# Patient Record
Sex: Female | Born: 1945 | Race: White | Hispanic: No | Smoking: Never smoker
Health system: Southern US, Community
[De-identification: ages and names within clinical notes are randomized; demographics above are authoritative.]

## PROBLEM LIST (undated history)

## (undated) DIAGNOSIS — F32A Depression, unspecified: Secondary | ICD-10-CM

## (undated) DIAGNOSIS — J189 Pneumonia, unspecified organism: Secondary | ICD-10-CM

## (undated) DIAGNOSIS — R112 Nausea with vomiting, unspecified: Secondary | ICD-10-CM

## (undated) DIAGNOSIS — D649 Anemia, unspecified: Secondary | ICD-10-CM

## (undated) DIAGNOSIS — I5032 Chronic diastolic (congestive) heart failure: Secondary | ICD-10-CM

## (undated) DIAGNOSIS — R519 Headache, unspecified: Secondary | ICD-10-CM

## (undated) DIAGNOSIS — I10 Essential (primary) hypertension: Secondary | ICD-10-CM

## (undated) DIAGNOSIS — E039 Hypothyroidism, unspecified: Secondary | ICD-10-CM

## (undated) DIAGNOSIS — R51 Headache: Secondary | ICD-10-CM

## (undated) DIAGNOSIS — R55 Syncope and collapse: Secondary | ICD-10-CM

## (undated) DIAGNOSIS — E1110 Type 2 diabetes mellitus with ketoacidosis without coma: Secondary | ICD-10-CM

## (undated) DIAGNOSIS — M1990 Unspecified osteoarthritis, unspecified site: Secondary | ICD-10-CM

## (undated) DIAGNOSIS — J449 Chronic obstructive pulmonary disease, unspecified: Secondary | ICD-10-CM

## (undated) DIAGNOSIS — R42 Dizziness and giddiness: Secondary | ICD-10-CM

## (undated) DIAGNOSIS — B029 Zoster without complications: Secondary | ICD-10-CM

## (undated) DIAGNOSIS — E785 Hyperlipidemia, unspecified: Secondary | ICD-10-CM

## (undated) DIAGNOSIS — D126 Benign neoplasm of colon, unspecified: Secondary | ICD-10-CM

## (undated) DIAGNOSIS — Z9889 Other specified postprocedural states: Secondary | ICD-10-CM

## (undated) DIAGNOSIS — I509 Heart failure, unspecified: Secondary | ICD-10-CM

## (undated) DIAGNOSIS — E079 Disorder of thyroid, unspecified: Secondary | ICD-10-CM

## (undated) DIAGNOSIS — R569 Unspecified convulsions: Secondary | ICD-10-CM

## (undated) DIAGNOSIS — K219 Gastro-esophageal reflux disease without esophagitis: Secondary | ICD-10-CM

## (undated) DIAGNOSIS — F329 Major depressive disorder, single episode, unspecified: Secondary | ICD-10-CM

## (undated) DIAGNOSIS — J40 Bronchitis, not specified as acute or chronic: Secondary | ICD-10-CM

## (undated) HISTORY — DX: Unspecified convulsions: R56.9

## (undated) HISTORY — PX: TONSILLECTOMY: SUR1361

## (undated) HISTORY — PX: KNEE ARTHROSCOPY: SHX127

## (undated) HISTORY — DX: Gastro-esophageal reflux disease without esophagitis: K21.9

## (undated) HISTORY — DX: Unspecified osteoarthritis, unspecified site: M19.90

## (undated) HISTORY — DX: Major depressive disorder, single episode, unspecified: F32.9

## (undated) HISTORY — DX: Chronic obstructive pulmonary disease, unspecified: J44.9

## (undated) HISTORY — DX: Depression, unspecified: F32.A

## (undated) HISTORY — PX: COLONOSCOPY W/ BIOPSIES AND POLYPECTOMY: SHX1376

## (undated) HISTORY — DX: Bronchitis, not specified as acute or chronic: J40

## (undated) HISTORY — PX: CHOLECYSTECTOMY: SHX55

## (undated) HISTORY — DX: Benign neoplasm of colon, unspecified: D12.6

## (undated) HISTORY — PX: ESOPHAGOGASTRODUODENOSCOPY: SHX1529

## (undated) HISTORY — PX: CATARACT EXTRACTION W/ INTRAOCULAR LENS IMPLANT: SHX1309

## (undated) HISTORY — PX: ANKLE SURGERY: SHX546

## (undated) HISTORY — PX: OTHER SURGICAL HISTORY: SHX169

## (undated) HISTORY — PX: CATARACT EXTRACTION: SUR2

## (undated) HISTORY — PX: APPENDECTOMY: SHX54

## (undated) HISTORY — PX: NASAL SINUS SURGERY: SHX719

## (undated) HISTORY — DX: Zoster without complications: B02.9

## (undated) HISTORY — DX: Hyperlipidemia, unspecified: E78.5

## (undated) HISTORY — DX: Disorder of thyroid, unspecified: E07.9

## (undated) HISTORY — DX: Essential (primary) hypertension: I10

---

## 2008-03-05 ENCOUNTER — Ambulatory Visit (HOSPITAL_COMMUNITY): Admission: RE | Admit: 2008-03-05 | Discharge: 2008-03-05 | Payer: Self-pay | Admitting: Internal Medicine

## 2008-12-18 ENCOUNTER — Ambulatory Visit: Payer: Self-pay | Admitting: Internal Medicine

## 2008-12-18 DIAGNOSIS — Z860100 Personal history of colon polyps, unspecified: Secondary | ICD-10-CM | POA: Insufficient documentation

## 2008-12-18 DIAGNOSIS — K219 Gastro-esophageal reflux disease without esophagitis: Secondary | ICD-10-CM | POA: Insufficient documentation

## 2008-12-18 DIAGNOSIS — K5909 Other constipation: Secondary | ICD-10-CM | POA: Insufficient documentation

## 2008-12-18 DIAGNOSIS — E1169 Type 2 diabetes mellitus with other specified complication: Secondary | ICD-10-CM | POA: Insufficient documentation

## 2008-12-18 DIAGNOSIS — R1319 Other dysphagia: Secondary | ICD-10-CM | POA: Insufficient documentation

## 2009-01-07 ENCOUNTER — Ambulatory Visit: Payer: Self-pay | Admitting: Internal Medicine

## 2009-01-07 ENCOUNTER — Encounter: Payer: Self-pay | Admitting: Internal Medicine

## 2009-01-09 ENCOUNTER — Emergency Department (HOSPITAL_COMMUNITY): Admission: EM | Admit: 2009-01-09 | Discharge: 2009-01-09 | Payer: Self-pay | Admitting: Emergency Medicine

## 2009-01-10 ENCOUNTER — Encounter: Payer: Self-pay | Admitting: Internal Medicine

## 2009-02-18 ENCOUNTER — Ambulatory Visit (HOSPITAL_COMMUNITY): Admission: RE | Admit: 2009-02-18 | Discharge: 2009-02-18 | Payer: Self-pay | Admitting: Internal Medicine

## 2010-09-18 LAB — GLUCOSE, CAPILLARY: Glucose-Capillary: 342 mg/dL — ABNORMAL HIGH (ref 70–99)

## 2010-10-17 ENCOUNTER — Other Ambulatory Visit: Payer: Self-pay | Admitting: General Surgery

## 2010-10-17 DIAGNOSIS — K439 Ventral hernia without obstruction or gangrene: Secondary | ICD-10-CM

## 2010-10-20 ENCOUNTER — Other Ambulatory Visit: Payer: Self-pay

## 2010-10-25 ENCOUNTER — Ambulatory Visit
Admission: RE | Admit: 2010-10-25 | Discharge: 2010-10-25 | Disposition: A | Discharge status: Home / Self Care | Payer: Medicare Other | Source: Ambulatory Visit | Attending: General Surgery | Admitting: General Surgery

## 2010-10-25 DIAGNOSIS — K439 Ventral hernia without obstruction or gangrene: Secondary | ICD-10-CM

## 2010-10-25 MED ORDER — IOHEXOL 300 MG/ML  SOLN
125.0000 mL | Freq: Once | INTRAMUSCULAR | Status: AC | PRN
  Administered 2010-10-25: 125 mL via INTRAVENOUS

## 2010-11-16 ENCOUNTER — Encounter (INDEPENDENT_AMBULATORY_CARE_PROVIDER_SITE_OTHER): Payer: Self-pay | Admitting: General Surgery

## 2011-02-07 ENCOUNTER — Emergency Department (HOSPITAL_COMMUNITY)
Admission: EM | Admit: 2011-02-07 | Discharge: 2011-02-07 | Disposition: A | Discharge status: Home / Self Care | Payer: Medicare Other | Attending: Emergency Medicine | Admitting: Emergency Medicine

## 2011-02-07 ENCOUNTER — Encounter (HOSPITAL_COMMUNITY): Payer: Self-pay

## 2011-02-07 DIAGNOSIS — E119 Type 2 diabetes mellitus without complications: Secondary | ICD-10-CM | POA: Insufficient documentation

## 2011-02-07 DIAGNOSIS — I10 Essential (primary) hypertension: Secondary | ICD-10-CM | POA: Insufficient documentation

## 2011-02-07 DIAGNOSIS — E079 Disorder of thyroid, unspecified: Secondary | ICD-10-CM | POA: Insufficient documentation

## 2011-02-07 DIAGNOSIS — H109 Unspecified conjunctivitis: Secondary | ICD-10-CM

## 2011-02-07 DIAGNOSIS — H10029 Other mucopurulent conjunctivitis, unspecified eye: Secondary | ICD-10-CM | POA: Insufficient documentation

## 2011-02-07 MED ORDER — TETRACAINE HCL 0.5 % OP SOLN
OPHTHALMIC | Status: AC
  Filled 2011-02-07: qty 2

## 2011-02-07 NOTE — ED Provider Notes (Signed)
History     CSN: [id number] Arrival date & time: 02/07/2011  4:42 PM  Chief Complaint  Patient presents with  . Foreign Body in Eye   Patient is a 65 y.o. female presenting with foreign body in eye. The history is provided by the patient. No language interpreter was used.  Foreign Body in Eye This is a new problem. The current episode started today. The symptoms are aggravated by nothing. She has tried nothing for the symptoms. Improvement on treatment: improving spontaneously.    Past Medical History  Diagnosis Date  . Diabetes mellitus   . Hypertension   . Arthritis   . Cramps, muscle, general   . Shingles   . Thyroid disease   . Bronchitis     Past Surgical History  Procedure Date  . Cholecystectomy   . Orthopedic surgery   . Non cancerous mass removed     small intestine  . Tonsillectomy   . Eye surgery     cataract syrgery with implants both eyes  . Nasal sinus surgery     Family History  Problem Relation Age of Onset  . Diabetes Mother   . Asthma Mother   . Hypertension Father   . Heart disease Father   . Diabetes Father   . Heart disease Sister   . Hypertension Sister   . Asthma Sister   . Diabetes Brother   . Heart disease Brother   . Stroke Brother     History  Substance Use Topics  . Smoking status: Unknown If Ever Smoked  . Smokeless tobacco: Not on file  . Alcohol Use: No    OB History    Grav Para Term Preterm Abortions TAB SAB Ect Mult Living                  Review of Systems  Eyes: Positive for pain. Negative for photophobia, discharge, redness, itching and visual disturbance.    Physical Exam  BP 152/58  Pulse 100  Temp(Src) 97.6 F (36.4 C) (Oral)  Resp 20  Ht 5\' 8"  (1.727 m)  Wt 253 lb (114.76 kg)  BMI 38.47 kg/m2  SpO2 99%  Physical Exam  Nursing note and vitals reviewed. Constitutional: She is oriented to person, place, and time. Vital signs are normal. She appears well-developed and well-nourished. No distress.    HENT:  Head: Normocephalic and atraumatic.  Right Ear: External ear normal.  Left Ear: External ear normal.  Nose: Nose normal.  Mouth/Throat: No oropharyngeal exudate.  Eyes: Conjunctivae, EOM and lids are normal. Pupils are equal, round, and reactive to light. Left eye exhibits no discharge, no exudate and no hordeolum. No foreign body present in the left eye. Left conjunctiva is not injected. No scleral icterus. Left eye exhibits normal extraocular motion and no nystagmus. Left pupil is round and reactive. Pupils are equal.    Neck: Normal range of motion. Neck supple. No JVD present. No tracheal deviation present. No thyromegaly present.  Cardiovascular: Normal rate, regular rhythm, normal heart sounds, intact distal pulses and normal pulses.  Exam reveals no gallop and no friction rub.   No murmur heard. Pulmonary/Chest: Effort normal and breath sounds normal. No stridor. No respiratory distress. She has no wheezes. She has no rales. She exhibits no tenderness.  Abdominal: Soft. Normal appearance and bowel sounds are normal. She exhibits no distension and no mass. There is no tenderness. There is no rebound and no guarding.  Musculoskeletal: Normal range of motion. She exhibits no  edema and no tenderness.  Lymphadenopathy:    She has no cervical adenopathy.  Neurological: She is alert and oriented to person, place, and time. She has normal reflexes. No cranial nerve deficit. Coordination normal. GCS eye subscore is 4. GCS verbal subscore is 5. GCS motor subscore is 6.  Reflex Scores:      Tricep reflexes are 2+ on the right side and 2+ on the left side.      Bicep reflexes are 2+ on the right side and 2+ on the left side.      Brachioradialis reflexes are 2+ on the right side and 2+ on the left side.      Patellar reflexes are 2+ on the right side and 2+ on the left side.      Achilles reflexes are 2+ on the right side and 2+ on the left side. Skin: Skin is warm and dry. No rash noted.  She is not diaphoretic.  Psychiatric: She has a normal mood and affect. Her speech is normal and behavior is normal. Judgment and thought content normal. Cognition and memory are normal.    ED Course  Procedures  MDM Pt's L   Eye irrigated with 250 ml of NS via morgan lens.  States she feels better.  PH now very close to 7.      Duaine Dredge, [place] 02/07/11 2028

## 2011-02-07 NOTE — ED Provider Notes (Signed)
Medical screening examination/treatment/procedure(s) were performed by non-physician practitioner and as supervising physician I was immediately available for consultation/collaboration.   Alfonzo Feller, DO 02/07/11 2211

## 2011-02-07 NOTE — ED Notes (Signed)
Morgan lens removed from left eye after 50 mls NS flushed through it.  Pt tolerated well. States eye feels better.

## 2011-02-07 NOTE — ED Notes (Signed)
Pt came to desk says "I want to go"  When told that her nurse would be in to speak with her, she became loud and  Michela Pitcher that she was spoken to loudly by nurse.  When I went in to speak with her, she said she was diabetic and had not eaten.  I offered to get her something to eat. She refused and called the nurse "the mouth of the [place]".  And spoke disparingly about this hospital.  "I want the doctor to see me so I can go home".  Pt  Speaking loudly and abusively to staff. Security came to talk to pt

## 2011-02-07 NOTE — ED Notes (Signed)
Pt states she accidentally put ear drops in her left eye (  Ring relief ) pt states she has flushed her eye with tap water

## 2011-02-07 NOTE — ED Notes (Signed)
Morgan Lens placed left eye-irrigation with NS 284ml.

## 2011-02-07 NOTE — ED Notes (Signed)
Pt at the desk stating she is diabetic and she has been here to long and that she needs to be seen. Pt very condescending when talking to the nurse at the desk stating "AMA"; I informed pt that I would find her nurse and she stated "you don't have to get an attitude about it, I will have your job" and following me around desk yelling out obscene comments; Jodi [place] RN assisted pt back to her room and security called; pt told that if she does not comply with the hospital rules she will be escorted out by security; security on standby and pt in room at this time

## 2011-02-07 NOTE — ED Notes (Signed)
Pt calm sitting on stretcher eating a biscuit  from her purse.  Sister at her side stated she is just an impatient person. Pt states would like to go home and has been tearful stating she can't believe she did this, R Miller PA notified of pt's impatience.

## 2011-04-13 ENCOUNTER — Ambulatory Visit (HOSPITAL_COMMUNITY)
Admission: RE | Admit: 2011-04-13 | Discharge: 2011-04-13 | Disposition: A | Discharge status: Home / Self Care | Payer: Medicare Other | Source: Ambulatory Visit | Attending: Internal Medicine | Admitting: Internal Medicine

## 2011-04-13 ENCOUNTER — Other Ambulatory Visit (HOSPITAL_COMMUNITY): Payer: Self-pay | Admitting: Internal Medicine

## 2011-04-13 DIAGNOSIS — R05 Cough: Secondary | ICD-10-CM | POA: Insufficient documentation

## 2011-04-13 DIAGNOSIS — R059 Cough, unspecified: Secondary | ICD-10-CM | POA: Insufficient documentation

## 2012-11-29 ENCOUNTER — Encounter: Payer: Self-pay | Admitting: Cardiovascular Disease

## 2013-04-30 ENCOUNTER — Ambulatory Visit (INDEPENDENT_AMBULATORY_CARE_PROVIDER_SITE_OTHER): Payer: Medicare Other | Admitting: Cardiovascular Disease

## 2013-04-30 ENCOUNTER — Encounter: Payer: Self-pay | Admitting: Cardiovascular Disease

## 2013-04-30 VITALS — BP 164/68 | HR 82 | Ht 68.0 in | Wt 227.0 lb

## 2013-04-30 DIAGNOSIS — R0600 Dyspnea, unspecified: Secondary | ICD-10-CM | POA: Insufficient documentation

## 2013-04-30 DIAGNOSIS — R0609 Other forms of dyspnea: Secondary | ICD-10-CM

## 2013-04-30 DIAGNOSIS — R079 Chest pain, unspecified: Secondary | ICD-10-CM

## 2013-04-30 DIAGNOSIS — R0989 Other specified symptoms and signs involving the circulatory and respiratory systems: Secondary | ICD-10-CM

## 2013-04-30 DIAGNOSIS — R011 Cardiac murmur, unspecified: Secondary | ICD-10-CM

## 2013-04-30 NOTE — Patient Instructions (Signed)
Your physician recommends that you schedule a follow-up appointment in:  4-5 weeks.   Your physician has requested that you have a lexiscan myoview. For further information please visit [email or web address]. Please follow instruction sheet, as given.  Your physician has requested that you have an echocardiogram. Echocardiography is a painless test that uses sound waves to create images of your heart. It provides your doctor with information about the size and shape of your heart and how well your heart's chambers and valves are working. This procedure takes approximately one hour. There are no restrictions for this procedure.

## 2013-04-30 NOTE — Progress Notes (Signed)
History of Present Illness: 67 yo female with history of DM, HTN, HLD, hypothyroidism, GERD, anemia here today as a new patient for evaluation of dyspnea. She has no prior documented cardiac disease. She tells me that she has been having dyspnea with minimal exertion. She has recently gained 29 lbs over last 6 months. She also describes chest pressure, center of chest when walking. Resolves quickly with rest. She also has a dry hacking cough. She has a rescue inhaler for frequent episodes of bronchitis. She had a stress test 15 years ago. She had a cardiac cath that year and was told it was normal. Chronic right lower ext edema from orthopedic injury.   Primary Care Physician: Reynold Bowen   Past Medical History  Diagnosis Date  . Diabetes mellitus   . Hypertension   . Arthritis   . Shingles   . hypothyroidism   . Bronchitis   . Hyperlipidemia   . GERD (gastroesophageal reflux disease)     Past Surgical History  Procedure Laterality Date  . Cholecystectomy    . Ankle surgery      x 4  . Non cancerous mass removed      small intestine  . Tonsillectomy    . Nasal sinus surgery    . Cataract extraction Bilateral   . Appendectomy    . Knee arthroscopy      Current Outpatient Prescriptions  Medication Sig Dispense Refill  . acetaminophen-codeine (TYLENOL #3) 300-30 MG per tablet Take 1 tablet by mouth every 4 (four) hours as needed. For pain      . Carbamide Peroxide (EAR DROPS OT) Place 1 drop in ear(s) as needed.        . carisoprodol (SOMA) 350 MG tablet Take 700 mg by mouth 2 (two) times daily. For cramps      . esomeprazole (NEXIUM) 40 MG capsule Take 40 mg by mouth daily before breakfast.        . furosemide (LASIX) 20 MG tablet Take 20 mg by mouth daily as needed.       . Hypromellose (GENTEAL OP) Apply 1 drop to eye daily. For moisture       . insulin regular (HUMULIN R) 100 units/mL injection Inject 10 Units into the skin 3 (three) times daily before meals.          . insulin regular (HUMULIN R,NOVOLIN R) 100 UNIT/ML injection Inject 500 Units into the skin 3 (three) times daily before meals.        Marland Kitchen levothyroxine (SYNTHROID, LEVOTHROID) 125 MCG tablet Take 125 mcg by mouth daily.        Marland Kitchen PARoxetine (PAXIL) 20 MG tablet Take 20 mg by mouth every morning.        . quinapril (ACCUPRIL) 20 MG tablet Take 20 mg by mouth daily.       . simvastatin (ZOCOR) 20 MG tablet Take 20 mg by mouth at bedtime.         No current facility-administered medications for this visit.    Allergies  Allergen Reactions  . Aspirin     REACTION: rash  . Erythromycin     REACTION: VOMITING  . Levofloxacin     REACTION: rash  . Sulfonamide Derivatives     REACTION: anaphylaxis    History   Social History  . Marital Status: Widowed    Spouse Name: N/A    Number of Children: N/A  . Years of Education: N/A   Occupational History  .  Retired-[employer]    Social History Main Topics  . Smoking status: Never Smoker   . Smokeless tobacco: Not on file  . Alcohol Use: No  . Drug Use: No  . Sexual Activity: Not on file   Other Topics Concern  . Not on file   Social History Narrative  . No narrative on file    Family History  Problem Relation Age of Onset  . Diabetes Mother   . Asthma Mother   . Hypertension Father   . Heart disease Father   . Diabetes Father   . Heart disease Sister   . Hypertension Sister   . Asthma Sister   . Diabetes Brother   . CAD Brother   . Stroke Brother     Review of Systems:  As stated in the HPI and otherwise negative.   BP 164/68  Pulse 82  Ht 5\' 8"  (1.727 m)  Wt 227 lb (102.967 kg)  BMI 34.52 kg/m2  Physical Examination: General: Well developed, well nourished, NAD HEENT: OP clear, mucus membranes moist SKIN: warm, dry. No rashes. Neuro: No focal deficits Musculoskeletal: Muscle strength 5/5 all ext Psychiatric: Mood and affect normal Neck: No JVD, no carotid bruits, no thyromegaly, no lymphadenopathy. Lungs:Clear  bilaterally, no wheezes, rhonci, crackles Cardiovascular: Regular rate and rhythm. No murmurs, gallops or rubs. Abdomen:Soft. Bowel sounds present. Non-tender.  Extremities: No lower extremity edema. Pulses are 2 + in the bilateral DP/PT.  EKG: NSR, rate 82 bpm. Non-specific ST abnormality.   Assessment and Plan:   1. Dyspnea: She has risk factors for CAD including HTN, HLD, DM, obesity, FH of CAD. Will arrange stress myoview to exclude ischemia. Will also arrange echo to exclude structural heart disease.   2. Chest pain: As above, will arrange stress myoview to exclude ischemia.   3. Cardiac murmur: Will assess with echo.

## 2013-05-15 ENCOUNTER — Ambulatory Visit (HOSPITAL_COMMUNITY): Payer: Medicare Other | Attending: Cardiology | Admitting: Radiology

## 2013-05-15 ENCOUNTER — Encounter: Payer: Self-pay | Admitting: Cardiology

## 2013-05-15 DIAGNOSIS — I10 Essential (primary) hypertension: Secondary | ICD-10-CM | POA: Insufficient documentation

## 2013-05-15 DIAGNOSIS — R0989 Other specified symptoms and signs involving the circulatory and respiratory systems: Secondary | ICD-10-CM | POA: Insufficient documentation

## 2013-05-15 DIAGNOSIS — I059 Rheumatic mitral valve disease, unspecified: Secondary | ICD-10-CM | POA: Insufficient documentation

## 2013-05-15 DIAGNOSIS — E119 Type 2 diabetes mellitus without complications: Secondary | ICD-10-CM | POA: Insufficient documentation

## 2013-05-15 DIAGNOSIS — Z6834 Body mass index (BMI) 34.0-34.9, adult: Secondary | ICD-10-CM | POA: Insufficient documentation

## 2013-05-15 DIAGNOSIS — R0600 Dyspnea, unspecified: Secondary | ICD-10-CM

## 2013-05-15 DIAGNOSIS — R011 Cardiac murmur, unspecified: Secondary | ICD-10-CM | POA: Insufficient documentation

## 2013-05-15 DIAGNOSIS — E669 Obesity, unspecified: Secondary | ICD-10-CM | POA: Insufficient documentation

## 2013-05-15 DIAGNOSIS — R072 Precordial pain: Secondary | ICD-10-CM

## 2013-05-15 DIAGNOSIS — R079 Chest pain, unspecified: Secondary | ICD-10-CM | POA: Insufficient documentation

## 2013-05-15 DIAGNOSIS — R0602 Shortness of breath: Secondary | ICD-10-CM | POA: Insufficient documentation

## 2013-05-15 DIAGNOSIS — R0609 Other forms of dyspnea: Secondary | ICD-10-CM | POA: Insufficient documentation

## 2013-05-15 DIAGNOSIS — I359 Nonrheumatic aortic valve disorder, unspecified: Secondary | ICD-10-CM | POA: Insufficient documentation

## 2013-05-15 DIAGNOSIS — E785 Hyperlipidemia, unspecified: Secondary | ICD-10-CM | POA: Insufficient documentation

## 2013-05-15 NOTE — Progress Notes (Signed)
Echocardiogram performed.  

## 2013-05-22 ENCOUNTER — Encounter: Payer: Self-pay | Admitting: Cardiovascular Disease

## 2013-05-22 ENCOUNTER — Ambulatory Visit (HOSPITAL_COMMUNITY): Payer: Medicare Other | Attending: Cardiovascular Disease | Admitting: Radiology

## 2013-05-22 VITALS — BP 166/73 | Ht 68.0 in | Wt 228.0 lb

## 2013-05-22 DIAGNOSIS — R0609 Other forms of dyspnea: Secondary | ICD-10-CM | POA: Insufficient documentation

## 2013-05-22 DIAGNOSIS — Z794 Long term (current) use of insulin: Secondary | ICD-10-CM | POA: Insufficient documentation

## 2013-05-22 DIAGNOSIS — I10 Essential (primary) hypertension: Secondary | ICD-10-CM | POA: Insufficient documentation

## 2013-05-22 DIAGNOSIS — Z8249 Family history of ischemic heart disease and other diseases of the circulatory system: Secondary | ICD-10-CM | POA: Insufficient documentation

## 2013-05-22 DIAGNOSIS — E785 Hyperlipidemia, unspecified: Secondary | ICD-10-CM | POA: Insufficient documentation

## 2013-05-22 DIAGNOSIS — R079 Chest pain, unspecified: Secondary | ICD-10-CM | POA: Insufficient documentation

## 2013-05-22 DIAGNOSIS — E109 Type 1 diabetes mellitus without complications: Secondary | ICD-10-CM | POA: Insufficient documentation

## 2013-05-22 DIAGNOSIS — R0600 Dyspnea, unspecified: Secondary | ICD-10-CM

## 2013-05-22 DIAGNOSIS — R0989 Other specified symptoms and signs involving the circulatory and respiratory systems: Secondary | ICD-10-CM | POA: Insufficient documentation

## 2013-05-22 MED ORDER — REGADENOSON 0.4 MG/5ML IV SOLN
0.4000 mg | Freq: Once | INTRAVENOUS | Status: AC
  Administered 2013-05-22: 0.4 mg via INTRAVENOUS

## 2013-05-22 MED ORDER — TECHNETIUM TC 99M SESTAMIBI GENERIC - CARDIOLITE
33.0000 | Freq: Once | INTRAVENOUS | Status: AC | PRN
  Administered 2013-05-22: 33 via INTRAVENOUS

## 2013-05-22 MED ORDER — TECHNETIUM TC 99M SESTAMIBI GENERIC - CARDIOLITE
11.0000 | Freq: Once | INTRAVENOUS | Status: AC | PRN
  Administered 2013-05-22: 11 via INTRAVENOUS

## 2013-05-22 NOTE — Progress Notes (Signed)
[facility] [address] [place], [place] [address] [phone]    Cardiology Nuclear Med Study  Jaclyn Herrera is a 67 y.o. female     MRN : [id number]     DOB: 1946/01/17  Procedure Date: 05/22/2013  Nuclear Med Background Indication for Stress Test:  Evaluation for Ischemia History:  Cath: 15 yrs NL, 05/15/13 ECHO:  Cardiac Risk Factors: Family History - CAD, Hypertension, IDDM Type 1 and Lipids  Symptoms:  Chest Pain and DOE   Nuclear Pre-Procedure Caffeine/Decaff Intake:  None NPO After: 10:00pm   Lungs:  clear O2 Sat: 98% on room air. IV 0.9% NS with Angio Cath:  22g  IV Site: L Antecubital  IV Started by:  Perrin Maltese, EMT-P  Chest Size (in):  48 Cup Size: A  Height: 5\' 8"  (1.727 m)  Weight:  228 lb (103.42 kg)  BMI:  Body mass index is 34.68 kg/(m^2). Tech Comments:  No Rx this am CBG 375 mg/dl 2 9:15 this am    Nuclear Med Study 1 or 2 day study: 1 day  Stress Test Type:  Carlton Adam  Reading MD: Jaclyn Rouge, MD  Order Authorizing Provider:  C.McAlhany MD  Resting Radionuclide: Technetium 23m Sestamibi  Resting Radionuclide Dose: 10.8 mCi   Stress Radionuclide:  Technetium 24m Sestamibi  Stress Radionuclide Dose: 33.0 mCi           Stress Protocol Rest HR: 74 Stress HR: 88  Rest BP: 166/73 Stress BP: 178/60  Exercise Time (min): n/a METS: n/a   Predicted Max HR: 152 bpm % Max HR: 57.89 bpm Rate Pressure Product: 15664   Dose of Adenosine (mg):  n/a Dose of Lexiscan: 0.4 mg  Dose of Atropine (mg): n/a Dose of Dobutamine: n/a mcg/kg/min (at max HR)  Stress Test Technologist: Perrin Maltese, EMT-P  Nuclear Technologist:  Charlton Amor, CNMT     Rest Procedure:  Myocardial perfusion imaging was performed at rest 45 minutes following the intravenous administration of Technetium 79m Sestamibi. Rest ECG: NSR - Normal EKG  Stress Procedure:  The patient received IV Lexiscan 0.4 mg over 15-seconds.  Technetium 28m Sestamibi  injected at 30-seconds. This patient had sob with the Lexiscan injection. Quantitative spect images were obtained after a 45 minute delay. Stress ECG: No significant change from baseline ECG  QPS Raw Data Images:  Normal; no motion artifact; normal heart/lung ratio. Stress Images:  Normal homogeneous uptake in all areas of the myocardium. Rest Images:  Normal homogeneous uptake in all areas of the myocardium. Subtraction (SDS):  Normal Transient Ischemic Dilatation (Normal <1.22):  1.06 Lung/Heart Ratio (Normal <0.45):  0.40  Quantitative Gated Spect Images QGS EDV:  92 ml QGS ESV:  28 ml  Impression Exercise Capacity:  Lexiscan with no exercise. BP Response:  Normal blood pressure response. Clinical Symptoms:  There is dyspnea. ECG Impression:  No significant ST segment change suggestive of ischemia. Comparison with Prior Nuclear Study: No images to compare  Overall Impression:  Normal stress nuclear study.  LV Ejection Fraction: 70%.  LV Wall Motion:  NL LV Function; NL Wall Motion   Jaclyn Herrera

## 2013-05-30 ENCOUNTER — Encounter: Payer: Self-pay | Admitting: Cardiovascular Disease

## 2013-05-30 ENCOUNTER — Ambulatory Visit (INDEPENDENT_AMBULATORY_CARE_PROVIDER_SITE_OTHER): Payer: Medicare Other | Admitting: Cardiovascular Disease

## 2013-05-30 VITALS — BP 173/83 | HR 79 | Ht 68.0 in | Wt 226.0 lb

## 2013-05-30 DIAGNOSIS — I5032 Chronic diastolic (congestive) heart failure: Secondary | ICD-10-CM

## 2013-05-30 DIAGNOSIS — I10 Essential (primary) hypertension: Secondary | ICD-10-CM

## 2013-05-30 DIAGNOSIS — I340 Nonrheumatic mitral (valve) insufficiency: Secondary | ICD-10-CM

## 2013-05-30 DIAGNOSIS — R0600 Dyspnea, unspecified: Secondary | ICD-10-CM

## 2013-05-30 DIAGNOSIS — I509 Heart failure, unspecified: Secondary | ICD-10-CM

## 2013-05-30 DIAGNOSIS — I059 Rheumatic mitral valve disease, unspecified: Secondary | ICD-10-CM

## 2013-05-30 DIAGNOSIS — R079 Chest pain, unspecified: Secondary | ICD-10-CM

## 2013-05-30 DIAGNOSIS — I517 Cardiomegaly: Secondary | ICD-10-CM

## 2013-05-30 DIAGNOSIS — R0609 Other forms of dyspnea: Secondary | ICD-10-CM

## 2013-05-30 MED ORDER — QUINAPRIL HCL 40 MG PO TABS: 40.0000 mg | ORAL_TABLET | Freq: Every day | ORAL | Status: DC

## 2013-05-30 NOTE — Patient Instructions (Signed)
Your physician wants you to follow-up in: 4 months.  You will receive a reminder letter in the mail two months in advance. If you don't receive a letter, please call our office to schedule the follow-up appointment.  Your physician has recommended you make the following change in your medication: Increase quinapril to 40 mg by mouth daily

## 2013-05-30 NOTE — Progress Notes (Signed)
History of Present Illness: 67 yo female with history of DM, HTN, HLD, hypothyroidism, GERD, anemia here today for cardiac follow up. I saw her as a new patient 04/30/13  for evaluation of dyspnea. She has no prior documented cardiac disease. She told me that she has been having dyspnea with minimal exertion. She has recently gained 29 lbs over last 6 months. She also describes chest pressure, center of chest when walking. Resolves quickly with rest. She also has a dry hacking cough. She has a rescue inhaler for frequent episodes of bronchitis. She had a stress test 15 years ago. She had a cardiac cath that year and was told it was normal. Chronic right lower ext edema from orthopedic injury. Echo 05/15/13 with moderate LVH, normal LV function, grade 2 diastolic dysfunction, mild MR. Lexiscan stress myoview 05/22/13 with no ischemia.   She is here today for follow up. She is feeling well. No chest pain. Breathing is stable. Weight is stable.   Primary Care Physician: Reynold Bowen   Past Medical History  Diagnosis Date  . Diabetes mellitus   . Hypertension   . Arthritis   . Shingles   . hypothyroidism   . Bronchitis   . Hyperlipidemia   . GERD (gastroesophageal reflux disease)     Past Surgical History  Procedure Laterality Date  . Cholecystectomy    . Ankle surgery      x 4  . Non cancerous mass removed      small intestine  . Tonsillectomy    . Nasal sinus surgery    . Cataract extraction Bilateral   . Appendectomy    . Knee arthroscopy      Current Outpatient Prescriptions  Medication Sig Dispense Refill  . acetaminophen-codeine (TYLENOL #3) 300-30 MG per tablet Take 1 tablet by mouth every 4 (four) hours as needed. For pain      . carisoprodol (SOMA) 350 MG tablet Take 700 mg by mouth 2 (two) times daily. For cramps      . esomeprazole (NEXIUM) 40 MG capsule Take 40 mg by mouth daily before breakfast.        . furosemide (LASIX) 20 MG tablet Take 20 mg by mouth daily  as needed.       . Hypromellose (GENTEAL OP) Apply 1 drop to eye daily. For moisture       . insulin regular (HUMULIN R,NOVOLIN R) 100 UNIT/ML injection Inject 500 Units into the skin 3 (three) times daily before meals.        Marland Kitchen levothyroxine (SYNTHROID, LEVOTHROID) 125 MCG tablet Take 125 mcg by mouth daily.        Marland Kitchen PARoxetine (PAXIL) 20 MG tablet Take 20 mg by mouth every morning.        . quinapril (ACCUPRIL) 20 MG tablet Take 20 mg by mouth daily.       . simvastatin (ZOCOR) 20 MG tablet Take 20 mg by mouth at bedtime.         No current facility-administered medications for this visit.    Allergies  Allergen Reactions  . Aspirin     REACTION: rash  . Erythromycin     REACTION: VOMITING  . Levofloxacin     REACTION: rash  . Sulfonamide Derivatives     REACTION: anaphylaxis    History   Social History  . Marital Status: Widowed    Spouse Name: N/A    Number of Children: N/A  . Years of Education: N/A  Occupational History  . Retired-[employer]    Social History Main Topics  . Smoking status: Never Smoker   . Smokeless tobacco: Not on file  . Alcohol Use: No  . Drug Use: No  . Sexual Activity: Not on file   Other Topics Concern  . Not on file   Social History Narrative  . No narrative on file    Family History  Problem Relation Age of Onset  . Diabetes Mother   . Asthma Mother   . Hypertension Father   . Heart disease Father   . Diabetes Father   . Heart disease Sister   . Hypertension Sister   . Asthma Sister   . Diabetes Brother   . CAD Brother   . Stroke Brother     Review of Systems:  As stated in the HPI and otherwise negative.   BP 173/83  Pulse 79  Ht 5\' 8"  (1.727 m)  Wt 226 lb (102.513 kg)  BMI 34.37 kg/m2  Physical Examination: General: Well developed, well nourished, NAD HEENT: OP clear, mucus membranes moist SKIN: warm, dry. No rashes. Neuro: No focal deficits Musculoskeletal: Muscle strength 5/5 all ext Psychiatric: Mood and  affect normal Neck: No JVD, no carotid bruits, no thyromegaly, no lymphadenopathy. Lungs:Clear bilaterally, no wheezes, rhonci, crackles Cardiovascular: Regular rate and rhythm. No murmurs, gallops or rubs. Abdomen:Soft. Bowel sounds present. Non-tender.  Extremities: No lower extremity edema. Pulses are 2 + in the bilateral DP/PT.  EKG: NSR, rate 82 bpm. Non-specific ST abnormality.   Echo 05/15/13: Left ventricle: The cavity size was normal. There was moderate concentric hypertrophy. Systolic function was normal. The estimated ejection fraction was in the range of 60% to 65%. Wall motion was normal; there were no regional wall motion abnormalities. Features are consistent with a pseudonormal left ventricular filling pattern, with concomitant abnormal relaxation and increased filling pressure (grade 2 diastolic dysfunction). Doppler parameters are consistent with elevated ventricular end-diastolic filling pressure. - Aortic valve: Mildly calcified annulus. - Mitral valve: Calcified annulus. Mildly thickened leaflets . Mild regurgitation. - Left atrium: The atrium was mildly dilated. - Atrial septum: No defect or patent foramen ovale was Identified.  Lexiscan stress myoview 05/22/13: Stress Procedure: The patient received IV Lexiscan 0.4 mg over 15-seconds. Technetium 56m Sestamibi injected at 30-seconds. This patient had sob with the Lexiscan injection. Quantitative spect images were obtained after a 45 minute delay.  Stress ECG: No significant change from baseline ECG  QPS  Raw Data Images: Normal; no motion artifact; normal heart/lung ratio.  Stress Images: Normal homogeneous uptake in all areas of the myocardium.  Rest Images: Normal homogeneous uptake in all areas of the myocardium.  Subtraction (SDS): Normal  Transient Ischemic Dilatation (Normal <1.22): 1.06  Lung/Heart Ratio (Normal <0.45): 0.40  Quantitative Gated Spect Images  QGS EDV: 92 ml  QGS ESV: 28 ml    Impression  Exercise Capacity: Lexiscan with no exercise.  BP Response: Normal blood pressure response.  Clinical Symptoms: There is dyspnea.  ECG Impression: No significant ST segment change suggestive of ischemia.  Comparison with Prior Nuclear Study: No images to compare  Overall Impression: Normal stress nuclear study.  LV Ejection Fraction: 70%. LV Wall Motion: NL LV Function; NL Wall Motion   Assessment and Plan:   1. Dyspnea: No evidence of ischemia on stress test. She does have moderate LVH. BP control will be important. Will try to get better BP control by increasing Lisinopril. I think her dyspnea is related to her weight  and deconditioning. Will resume daily exercise.   2. Chest pain: Non-cardiac. No further ischemic evaluation.   3. Mitral regurgitation: Mild by echo 12/14. Repeat echo December 2016.   4. Hypertensive heart disease: moderate LVH on echo. Will attempt better BP control with increased dose of Ace-inh.   5. HTN: BP is elevated today. Will increase quinapril to 40 mg po Qdaily  6. Chronic diastolic CHF: Continue Lasix. Weight is stable.

## 2013-07-08 DIAGNOSIS — I10 Essential (primary) hypertension: Secondary | ICD-10-CM | POA: Diagnosis not present

## 2013-07-08 DIAGNOSIS — D599 Acquired hemolytic anemia, unspecified: Secondary | ICD-10-CM | POA: Diagnosis not present

## 2013-07-08 DIAGNOSIS — E1149 Type 2 diabetes mellitus with other diabetic neurological complication: Secondary | ICD-10-CM | POA: Diagnosis not present

## 2013-07-08 DIAGNOSIS — F329 Major depressive disorder, single episode, unspecified: Secondary | ICD-10-CM | POA: Diagnosis not present

## 2013-07-08 DIAGNOSIS — K222 Esophageal obstruction: Secondary | ICD-10-CM | POA: Diagnosis not present

## 2013-07-08 DIAGNOSIS — E042 Nontoxic multinodular goiter: Secondary | ICD-10-CM | POA: Diagnosis not present

## 2013-07-08 DIAGNOSIS — E1142 Type 2 diabetes mellitus with diabetic polyneuropathy: Secondary | ICD-10-CM | POA: Diagnosis not present

## 2013-07-08 DIAGNOSIS — E785 Hyperlipidemia, unspecified: Secondary | ICD-10-CM | POA: Diagnosis not present

## 2013-07-14 ENCOUNTER — Emergency Department (HOSPITAL_COMMUNITY)
Admission: EM | Admit: 2013-07-14 | Discharge: 2013-07-14 | Disposition: A | Discharge status: Home / Self Care | Payer: Medicare Other | Attending: Emergency Medicine | Admitting: Emergency Medicine

## 2013-07-14 ENCOUNTER — Emergency Department (HOSPITAL_COMMUNITY): Payer: Medicare Other

## 2013-07-14 ENCOUNTER — Encounter (HOSPITAL_COMMUNITY): Payer: Self-pay | Admitting: Emergency Medicine

## 2013-07-14 DIAGNOSIS — E119 Type 2 diabetes mellitus without complications: Secondary | ICD-10-CM | POA: Insufficient documentation

## 2013-07-14 DIAGNOSIS — Z79899 Other long term (current) drug therapy: Secondary | ICD-10-CM | POA: Insufficient documentation

## 2013-07-14 DIAGNOSIS — Y929 Unspecified place or not applicable: Secondary | ICD-10-CM | POA: Insufficient documentation

## 2013-07-14 DIAGNOSIS — Z8619 Personal history of other infectious and parasitic diseases: Secondary | ICD-10-CM | POA: Insufficient documentation

## 2013-07-14 DIAGNOSIS — S0990XA Unspecified injury of head, initial encounter: Secondary | ICD-10-CM | POA: Insufficient documentation

## 2013-07-14 DIAGNOSIS — W1809XA Striking against other object with subsequent fall, initial encounter: Secondary | ICD-10-CM | POA: Insufficient documentation

## 2013-07-14 DIAGNOSIS — K219 Gastro-esophageal reflux disease without esophagitis: Secondary | ICD-10-CM | POA: Insufficient documentation

## 2013-07-14 DIAGNOSIS — R739 Hyperglycemia, unspecified: Secondary | ICD-10-CM

## 2013-07-14 DIAGNOSIS — S0100XA Unspecified open wound of scalp, initial encounter: Secondary | ICD-10-CM | POA: Insufficient documentation

## 2013-07-14 DIAGNOSIS — E785 Hyperlipidemia, unspecified: Secondary | ICD-10-CM | POA: Insufficient documentation

## 2013-07-14 DIAGNOSIS — S0101XA Laceration without foreign body of scalp, initial encounter: Secondary | ICD-10-CM

## 2013-07-14 DIAGNOSIS — Z9889 Other specified postprocedural states: Secondary | ICD-10-CM | POA: Insufficient documentation

## 2013-07-14 DIAGNOSIS — I10 Essential (primary) hypertension: Secondary | ICD-10-CM | POA: Insufficient documentation

## 2013-07-14 DIAGNOSIS — W19XXXA Unspecified fall, initial encounter: Secondary | ICD-10-CM

## 2013-07-14 DIAGNOSIS — M129 Arthropathy, unspecified: Secondary | ICD-10-CM | POA: Insufficient documentation

## 2013-07-14 DIAGNOSIS — E079 Disorder of thyroid, unspecified: Secondary | ICD-10-CM | POA: Insufficient documentation

## 2013-07-14 DIAGNOSIS — Y939 Activity, unspecified: Secondary | ICD-10-CM | POA: Insufficient documentation

## 2013-07-14 DIAGNOSIS — Z794 Long term (current) use of insulin: Secondary | ICD-10-CM | POA: Insufficient documentation

## 2013-07-14 DIAGNOSIS — R110 Nausea: Secondary | ICD-10-CM | POA: Insufficient documentation

## 2013-07-14 LAB — GLUCOSE, CAPILLARY
GLUCOSE-CAPILLARY: 413 mg/dL — AB (ref 70–99)
Glucose-Capillary: 239 mg/dL — ABNORMAL HIGH (ref 70–99)

## 2013-07-14 LAB — CBC WITH DIFFERENTIAL/PLATELET
Basophils Absolute: 0 10*3/uL (ref 0.0–0.1)
Basophils Relative: 0 % (ref 0–1)
EOS ABS: 0.1 10*3/uL (ref 0.0–0.7)
EOS PCT: 1 % (ref 0–5)
HCT: 33.3 % — ABNORMAL LOW (ref 36.0–46.0)
HEMOGLOBIN: 11.4 g/dL — AB (ref 12.0–15.0)
Lymphocytes Relative: 5 % — ABNORMAL LOW (ref 12–46)
Lymphs Abs: 0.3 10*3/uL — ABNORMAL LOW (ref 0.7–4.0)
MCH: 28.1 pg (ref 26.0–34.0)
MCHC: 34.2 g/dL (ref 30.0–36.0)
MCV: 82 fL (ref 78.0–100.0)
Monocytes Absolute: 0.4 10*3/uL (ref 0.1–1.0)
Monocytes Relative: 7 % (ref 3–12)
Neutro Abs: 5.1 10*3/uL (ref 1.7–7.7)
Neutrophils Relative %: 87 % — ABNORMAL HIGH (ref 43–77)
Platelets: 126 10*3/uL — ABNORMAL LOW (ref 150–400)
RBC: 4.06 MIL/uL (ref 3.87–5.11)
RDW: 15.9 % — ABNORMAL HIGH (ref 11.5–15.5)
WBC: 5.8 10*3/uL (ref 4.0–10.5)

## 2013-07-14 LAB — BASIC METABOLIC PANEL
BUN: 15 mg/dL (ref 6–23)
CO2: 26 meq/L (ref 19–32)
Calcium: 8.9 mg/dL (ref 8.4–10.5)
Chloride: 98 mEq/L (ref 96–112)
Creatinine, Ser: 0.77 mg/dL (ref 0.50–1.10)
GFR calc Af Amer: >90 mL/min (ref >90)
GFR calc non Af Amer: 84 mL/min — ABNORMAL LOW (ref >90)
Glucose, Bld: 402 mg/dL — ABNORMAL HIGH (ref 70–99)
Potassium: 3.9 mEq/L (ref 3.7–5.3)
SODIUM: 138 meq/L (ref 137–147)

## 2013-07-14 MED ORDER — SODIUM CHLORIDE 0.9 % IV SOLN
INTRAVENOUS | Status: DC
  Administered 2013-07-14: 15:00:00 via INTRAVENOUS

## 2013-07-14 MED ORDER — SODIUM CHLORIDE 0.9 % IV BOLUS (SEPSIS)
500.0000 mL | Freq: Once | INTRAVENOUS | Status: AC
  Administered 2013-07-14: 500 mL via INTRAVENOUS

## 2013-07-14 MED ORDER — INSULIN ASPART 100 UNIT/ML ~~LOC~~ SOLN
10.0000 [IU] | Freq: Once | SUBCUTANEOUS | Status: AC
  Administered 2013-07-14: 10 [IU] via INTRAVENOUS
  Filled 2013-07-14: qty 1
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
## 2013-07-14 NOTE — Discharge Instructions (Signed)
Wash scalp wound with soap and water daily. Can apply bacitracin ointment as needed. Return for any signs of infection. Her head CT was negative. Blood sugar was over 400 now down below 300. Monitor your blood sugar carefully at home followup with your doctor in the next few days as needed for blood sugar control.

## 2013-07-14 NOTE — ED Notes (Signed)
Discharge instructions reviewed with pt, questions answered. Pt verbalized understanding.  
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
## 2013-10-31 ENCOUNTER — Encounter: Payer: Self-pay | Admitting: Cardiovascular Disease

## 2013-11-24 ENCOUNTER — Ambulatory Visit: Payer: Medicare Other | Admitting: Cardiovascular Disease

## 2014-02-23 ENCOUNTER — Encounter: Payer: Self-pay | Admitting: Internal Medicine

## 2014-09-27 ENCOUNTER — Emergency Department (HOSPITAL_COMMUNITY): Payer: Medicare Other

## 2014-09-27 ENCOUNTER — Inpatient Hospital Stay (HOSPITAL_COMMUNITY)
Admission: EM | Admit: 2014-09-27 | Discharge: 2014-10-01 | DRG: 638 | Disposition: A | Discharge status: Home / Self Care | Payer: Medicare Other | Attending: Internal Medicine | Admitting: Internal Medicine

## 2014-09-27 ENCOUNTER — Encounter (HOSPITAL_COMMUNITY): Payer: Self-pay | Admitting: Cardiology

## 2014-09-27 DIAGNOSIS — Z886 Allergy status to analgesic agent status: Secondary | ICD-10-CM

## 2014-09-27 DIAGNOSIS — N179 Acute kidney failure, unspecified: Secondary | ICD-10-CM | POA: Diagnosis present

## 2014-09-27 DIAGNOSIS — E119 Type 2 diabetes mellitus without complications: Secondary | ICD-10-CM

## 2014-09-27 DIAGNOSIS — E785 Hyperlipidemia, unspecified: Secondary | ICD-10-CM | POA: Diagnosis present

## 2014-09-27 DIAGNOSIS — M1990 Unspecified osteoarthritis, unspecified site: Secondary | ICD-10-CM | POA: Diagnosis present

## 2014-09-27 DIAGNOSIS — E0810 Diabetes mellitus due to underlying condition with ketoacidosis without coma: Secondary | ICD-10-CM

## 2014-09-27 DIAGNOSIS — Z794 Long term (current) use of insulin: Secondary | ICD-10-CM

## 2014-09-27 DIAGNOSIS — R339 Retention of urine, unspecified: Secondary | ICD-10-CM | POA: Diagnosis present

## 2014-09-27 DIAGNOSIS — Z9842 Cataract extraction status, left eye: Secondary | ICD-10-CM | POA: Diagnosis not present

## 2014-09-27 DIAGNOSIS — E039 Hypothyroidism, unspecified: Secondary | ICD-10-CM | POA: Diagnosis present

## 2014-09-27 DIAGNOSIS — R197 Diarrhea, unspecified: Secondary | ICD-10-CM | POA: Diagnosis present

## 2014-09-27 DIAGNOSIS — Z882 Allergy status to sulfonamides status: Secondary | ICD-10-CM | POA: Diagnosis not present

## 2014-09-27 DIAGNOSIS — E875 Hyperkalemia: Secondary | ICD-10-CM

## 2014-09-27 DIAGNOSIS — Z6832 Body mass index (BMI) 32.0-32.9, adult: Secondary | ICD-10-CM

## 2014-09-27 DIAGNOSIS — E1010 Type 1 diabetes mellitus with ketoacidosis without coma: Principal | ICD-10-CM

## 2014-09-27 DIAGNOSIS — A084 Viral intestinal infection, unspecified: Secondary | ICD-10-CM | POA: Diagnosis present

## 2014-09-27 DIAGNOSIS — K219 Gastro-esophageal reflux disease without esophagitis: Secondary | ICD-10-CM | POA: Diagnosis present

## 2014-09-27 DIAGNOSIS — R05 Cough: Secondary | ICD-10-CM

## 2014-09-27 DIAGNOSIS — E1110 Type 2 diabetes mellitus with ketoacidosis without coma: Secondary | ICD-10-CM | POA: Diagnosis present

## 2014-09-27 DIAGNOSIS — R112 Nausea with vomiting, unspecified: Secondary | ICD-10-CM | POA: Diagnosis present

## 2014-09-27 DIAGNOSIS — Z888 Allergy status to other drugs, medicaments and biological substances status: Secondary | ICD-10-CM | POA: Diagnosis not present

## 2014-09-27 DIAGNOSIS — I10 Essential (primary) hypertension: Secondary | ICD-10-CM | POA: Diagnosis present

## 2014-09-27 DIAGNOSIS — Z881 Allergy status to other antibiotic agents status: Secondary | ICD-10-CM

## 2014-09-27 DIAGNOSIS — Z9841 Cataract extraction status, right eye: Secondary | ICD-10-CM | POA: Diagnosis not present

## 2014-09-27 DIAGNOSIS — E1310 Other specified diabetes mellitus with ketoacidosis without coma: Secondary | ICD-10-CM | POA: Diagnosis not present

## 2014-09-27 DIAGNOSIS — E669 Obesity, unspecified: Secondary | ICD-10-CM | POA: Diagnosis present

## 2014-09-27 DIAGNOSIS — R059 Cough, unspecified: Secondary | ICD-10-CM

## 2014-09-27 DIAGNOSIS — Z9049 Acquired absence of other specified parts of digestive tract: Secondary | ICD-10-CM | POA: Diagnosis present

## 2014-09-27 HISTORY — DX: Type 2 diabetes mellitus with ketoacidosis without coma: E11.10

## 2014-09-27 LAB — CBC WITH DIFFERENTIAL/PLATELET
BAND NEUTROPHILS: 0 % (ref 0–10)
BASOS ABS: 0 10*3/uL (ref 0.0–0.1)
BLASTS: 0 %
Basophils Relative: 0 % (ref 0–1)
Eosinophils Absolute: 0 10*3/uL (ref 0.0–0.7)
Eosinophils Relative: 0 % (ref 0–5)
HCT: 40.6 % (ref 36.0–46.0)
HEMOGLOBIN: 13.2 g/dL (ref 12.0–15.0)
Lymphocytes Relative: 3 % — ABNORMAL LOW (ref 12–46)
Lymphs Abs: 0.3 10*3/uL — ABNORMAL LOW (ref 0.7–4.0)
MCH: 28.4 pg (ref 26.0–34.0)
MCHC: 32.5 g/dL (ref 30.0–36.0)
MCV: 87.3 fL (ref 78.0–100.0)
METAMYELOCYTES PCT: 0 %
MONOS PCT: 4 % (ref 3–12)
Monocytes Absolute: 0.4 10*3/uL (ref 0.1–1.0)
Myelocytes: 0 %
NEUTROS ABS: 10.2 10*3/uL — AB (ref 1.7–7.7)
NEUTROS PCT: 93 % — AB (ref 43–77)
PLATELETS: 205 10*3/uL (ref 150–400)
Promyelocytes Absolute: 0 %
RBC: 4.65 MIL/uL (ref 3.87–5.11)
RDW: 16.6 % — ABNORMAL HIGH (ref 11.5–15.5)
WBC: 10.9 10*3/uL — AB (ref 4.0–10.5)
nRBC: 0 /100 WBC

## 2014-09-27 LAB — LIPASE, BLOOD: LIPASE: 13 U/L (ref 11–59)

## 2014-09-27 LAB — BASIC METABOLIC PANEL
Anion gap: 21 — ABNORMAL HIGH (ref 5–15)
Anion gap: 18 — ABNORMAL HIGH (ref 5–15)
BUN: 31 mg/dL — ABNORMAL HIGH (ref 6–23)
BUN: 29 mg/dL — ABNORMAL HIGH (ref 6–23)
CO2: 7 mmol/L — CL (ref 19–32)
CO2: 5 mmol/L — AB (ref 19–32)
CREATININE: 1.34 mg/dL — AB (ref 0.50–1.10)
CREATININE: 1.28 mg/dL — AB (ref 0.50–1.10)
Calcium: 8.7 mg/dL (ref 8.4–10.5)
Calcium: 8.3 mg/dL — ABNORMAL LOW (ref 8.4–10.5)
Chloride: 104 mmol/L (ref 96–112)
Chloride: 113 mmol/L — ABNORMAL HIGH (ref 96–112)
GFR calc Af Amer: 46 mL/min — ABNORMAL LOW (ref >90)
GFR calc Af Amer: 48 mL/min — ABNORMAL LOW (ref >90)
GFR calc non Af Amer: 42 mL/min — ABNORMAL LOW (ref >90)
GFR, EST NON AFRICAN AMERICAN: 39 mL/min — AB (ref >90)
Glucose, Bld: 657 mg/dL (ref 70–99)
Glucose, Bld: 586 mg/dL (ref 70–99)
POTASSIUM: 6.7 mmol/L — AB (ref 3.5–5.1)
POTASSIUM: 5.9 mmol/L — AB (ref 3.5–5.1)
SODIUM: 136 mmol/L (ref 135–145)
Sodium: 132 mmol/L — ABNORMAL LOW (ref 135–145)

## 2014-09-27 LAB — BLOOD GAS, ARTERIAL
Acid-base deficit: 26 mmol/L — ABNORMAL HIGH (ref 0.0–2.0)
Bicarbonate: 3.5 mEq/L — ABNORMAL LOW (ref 20.0–24.0)
Drawn by: 105551
FIO2: 0.24 %
O2 SAT: 96.3 %
PATIENT TEMPERATURE: 37
PO2 ART: 133 mmHg — AB (ref 80.0–100.0)
TCO2: 3.5 mmol/L (ref 0–100)
pCO2 arterial: 15.8 mmHg — CL (ref 35.0–45.0)
pH, Arterial: 6.974 — CL (ref 7.350–7.450)

## 2014-09-27 LAB — URINALYSIS, ROUTINE W REFLEX MICROSCOPIC
Glucose, UA: 500 mg/dL — AB
Leukocytes, UA: NEGATIVE
Nitrite: NEGATIVE
PH: 5.5 (ref 5.0–8.0)
Specific Gravity, Urine: 1.025 (ref 1.005–1.030)
Urobilinogen, UA: 0.2 mg/dL (ref 0.0–1.0)

## 2014-09-27 LAB — CBG MONITORING, ED
GLUCOSE-CAPILLARY: 575 mg/dL — AB (ref 70–99)
GLUCOSE-CAPILLARY: 549 mg/dL — AB (ref 70–99)

## 2014-09-27 LAB — URINE MICROSCOPIC-ADD ON

## 2014-09-27 LAB — HEPATIC FUNCTION PANEL
ALT: 24 U/L (ref 0–35)
AST: 22 U/L (ref 0–37)
Albumin: 4 g/dL (ref 3.5–5.2)
Alkaline Phosphatase: 159 U/L — ABNORMAL HIGH (ref 39–117)
BILIRUBIN TOTAL: 2.3 mg/dL — AB (ref 0.3–1.2)
Bilirubin, Direct: 0.3 mg/dL (ref 0.0–0.5)
Indirect Bilirubin: 2 mg/dL — ABNORMAL HIGH (ref 0.3–0.9)
Total Protein: 6.9 g/dL (ref 6.0–8.3)

## 2014-09-27 LAB — GLUCOSE, CAPILLARY
GLUCOSE-CAPILLARY: 484 mg/dL — AB (ref 70–99)
GLUCOSE-CAPILLARY: 519 mg/dL — AB (ref 70–99)
Glucose-Capillary: 474 mg/dL — ABNORMAL HIGH (ref 70–99)

## 2014-09-27 LAB — I-STAT TROPONIN, ED: Troponin i, poc: 0.01 ng/mL (ref 0.00–0.08)

## 2014-09-27 LAB — PROCALCITONIN: Procalcitonin: 0.21 ng/mL

## 2014-09-27 LAB — LACTIC ACID, PLASMA
Lactic Acid, Venous: 2.5 mmol/L (ref 0.5–2.0)
Lactic Acid, Venous: 2.5 mmol/L (ref 0.5–2.0)

## 2014-09-27 MED ORDER — CETYLPYRIDINIUM CHLORIDE 0.05 % MT LIQD
7.0000 mL | Freq: Two times a day (BID) | OROMUCOSAL | Status: DC
  Administered 2014-09-27 – 2014-10-01 (×8): 7 mL via OROMUCOSAL

## 2014-09-27 MED ORDER — SODIUM CHLORIDE 0.9 % IV SOLN
INTRAVENOUS | Status: DC
  Administered 2014-09-27 (×2): via INTRAVENOUS

## 2014-09-27 MED ORDER — DEXTROSE-NACL 5-0.45 % IV SOLN: INTRAVENOUS | Status: DC

## 2014-09-27 MED ORDER — SODIUM CHLORIDE 0.9 % IV BOLUS (SEPSIS)
1000.0000 mL | Freq: Once | INTRAVENOUS | Status: AC
  Administered 2014-09-27: 1000 mL via INTRAVENOUS

## 2014-09-27 MED ORDER — ENOXAPARIN SODIUM 40 MG/0.4ML ~~LOC~~ SOLN
40.0000 mg | SUBCUTANEOUS | Status: DC
  Administered 2014-09-27 – 2014-09-30 (×4): 40 mg via SUBCUTANEOUS
  Filled 2014-09-27 (×4): qty 0.4

## 2014-09-27 MED ORDER — PROMETHAZINE HCL 25 MG/ML IJ SOLN
12.5000 mg | Freq: Once | INTRAMUSCULAR | Status: AC
  Administered 2014-09-27: 12.5 mg via INTRAVENOUS

## 2014-09-27 MED ORDER — SODIUM CHLORIDE 0.9 % IV SOLN
INTRAVENOUS | Status: DC
  Administered 2014-09-27: 4.9 [IU]/h via INTRAVENOUS
  Filled 2014-09-27: qty 2.5

## 2014-09-27 MED ORDER — SODIUM CHLORIDE 0.9 % IV SOLN: INTRAVENOUS | Status: AC

## 2014-09-27 MED ORDER — HYDRALAZINE HCL 20 MG/ML IJ SOLN
10.0000 mg | Freq: Four times a day (QID) | INTRAMUSCULAR | Status: DC | PRN
  Administered 2014-09-27 – 2014-09-29 (×4): 10 mg via INTRAVENOUS
  Filled 2014-09-27 (×4): qty 1

## 2014-09-27 MED ORDER — SODIUM BICARBONATE 8.4 % IV SOLN
100.0000 meq | Freq: Once | INTRAVENOUS | Status: AC
  Administered 2014-09-27: 100 meq via INTRAVENOUS
  Filled 2014-09-27: qty 100
  Filled 2014-09-27: qty 50

## 2014-09-27 MED ORDER — SODIUM CHLORIDE 0.9 % IV SOLN
INTRAVENOUS | Status: DC
  Administered 2014-09-28: 10:00:00 via INTRAVENOUS
  Administered 2014-09-28: 7 [IU]/h via INTRAVENOUS
  Filled 2014-09-27 (×2): qty 2.5

## 2014-09-27 MED ORDER — PROMETHAZINE HCL 25 MG/ML IJ SOLN
INTRAMUSCULAR | Status: AC
  Administered 2014-09-27: 12.5 mg via INTRAVENOUS
  Filled 2014-09-27: qty 1

## 2014-09-27 MED ORDER — DEXTROSE-NACL 5-0.45 % IV SOLN
INTRAVENOUS | Status: DC
  Administered 2014-09-28 (×2): via INTRAVENOUS

## 2014-09-27 MED ORDER — ONDANSETRON HCL 4 MG/2ML IJ SOLN
4.0000 mg | Freq: Four times a day (QID) | INTRAMUSCULAR | Status: DC | PRN
  Administered 2014-09-28 – 2014-09-29 (×2): 4 mg via INTRAVENOUS
  Filled 2014-09-27 (×2): qty 2

## 2014-09-27 MED ORDER — SODIUM CHLORIDE 0.9 % IV SOLN
1000.0000 mL | INTRAVENOUS | Status: DC
  Administered 2014-09-27: 1000 mL via INTRAVENOUS

## 2014-09-27 NOTE — ED Provider Notes (Signed)
CSN: [id number]     Arrival date & time 09/27/14  1806 History   First MD Initiated Contact with Patient 09/27/14 1816     Chief Complaint  Patient presents with  . Emesis  . Hyperglycemia     (Consider location/radiation/quality/duration/timing/severity/associated sxs/prior Treatment) Patient is a 69 y.o. female presenting with vomiting and hyperglycemia. The history is provided by the patient.  Emesis Severity:  Moderate Associated symptoms: abdominal pain and diarrhea   Hyperglycemia Associated symptoms: abdominal pain, nausea, polyuria, shortness of breath and vomiting   Associated symptoms: no chest pain, no confusion and no fever    patient with nausea vomiting and some diarrhea for the last 3-4 days. Patient's sugars have been high at home. She's not taken her insulin because she states it was in her car. She does have some diffuse abdominal pain also. States she has chills without clear fever. She has had some urinary frequency but states her urine is decreased last couple days. She states she's been coughing with some blackish sputum production. She has had some sick contacts with her grandchildren.  Past Medical History  Diagnosis Date  . Diabetes mellitus   . Hypertension   . Arthritis   . Shingles   . hypothyroidism   . Bronchitis   . Hyperlipidemia   . GERD (gastroesophageal reflux disease)    Past Surgical History  Procedure Laterality Date  . Cholecystectomy    . Ankle surgery      x 4  . Non cancerous mass removed      small intestine  . Tonsillectomy    . Nasal sinus surgery    . Cataract extraction Bilateral   . Appendectomy    . Knee arthroscopy     Family History  Problem Relation Age of Onset  . Diabetes Mother   . Asthma Mother   . Hypertension Father   . Heart disease Father   . Diabetes Father   . Heart disease Sister   . Hypertension Sister   . Asthma Sister   . Diabetes Brother   . CAD Brother   . Stroke Brother    History   Substance Use Topics  . Smoking status: Never Smoker   . Smokeless tobacco: Never Used  . Alcohol Use: No   OB History    No data available     Review of Systems  Constitutional: Positive for appetite change. Negative for fever.  Respiratory: Positive for shortness of breath.   Cardiovascular: Negative for chest pain.  Gastrointestinal: Positive for nausea, vomiting, abdominal pain and diarrhea.  Endocrine: Positive for polyuria.  Musculoskeletal: Negative for back pain.  Skin: Negative for wound.  Neurological: Positive for light-headedness.  Hematological: Negative for adenopathy.  Psychiatric/Behavioral: Negative for confusion.      Allergies  Sulfonamide derivatives; Erythromycin; Mucinex; Aspirin; and Levofloxacin  Home Medications   Prior to Admission medications   Medication Sig Start Date End Date Taking? Authorizing Provider  carisoprodol (SOMA) 350 MG tablet Take 175-350 mg by mouth daily as needed for muscle spasms. For cramps   Yes Historical Provider, MD  esomeprazole (NEXIUM) 40 MG capsule Take 40 mg by mouth daily before breakfast.     Yes Historical Provider, MD  furosemide (LASIX) 20 MG tablet Take 20 mg by mouth daily as needed for fluid.    Yes Historical Provider, MD  insulin regular human CONCENTRATED (HUMULIN R) 500 UNIT/ML SOLN injection Inject 10-24 Units into the skin 3 (three) times daily with meals. Per sliding  scale.   Yes Historical Provider, MD  levothyroxine (SYNTHROID, LEVOTHROID) 125 MCG tablet Take 125 mcg by mouth daily.     Yes Historical Provider, MD  PARoxetine (PAXIL) 30 MG tablet Take 30 mg by mouth daily.   Yes Historical Provider, MD  Polyethylene Glycol 400 (BLINK TEARS OP) Place 2 drops into both eyes 3 (three) times daily.   Yes Historical Provider, MD  Senna-Psyllium (PERDIEM PO) Take 1 tablet by mouth daily.   Yes Historical Provider, MD  simvastatin (ZOCOR) 20 MG tablet Take 20 mg by mouth at bedtime.     Yes Historical  Provider, MD  acetaminophen (TYLENOL) 500 MG tablet Take 1,000 mg by mouth every 6 (six) hours as needed for headache.    Historical Provider, MD  acetaminophen-codeine (TYLENOL #3) 300-30 MG per tablet Take 1 tablet by mouth every 4 (four) hours as needed. For pain    Historical Provider, MD  diphenhydramine-acetaminophen (TYLENOL PM) 25-500 MG TABS Take 0.5-1 tablets by mouth at bedtime as needed (sleep).    Historical Provider, MD  quinapril (ACCUPRIL) 40 MG tablet Take 1 tablet (40 mg total) by mouth daily. 05/30/13   Burnell Blanks, MD   BP 120/78 mmHg  Pulse 110  Temp(Src) 97.6 F (36.4 C) (Rectal)  Resp 24  Ht 5\' 8"  (1.727 m)  Wt 240 lb (108.863 kg)  BMI 36.50 kg/m2  SpO2 100% Physical Exam  Constitutional: She appears well-developed.  HENT:  Head: Normocephalic.  Eyes: Pupils are equal, round, and reactive to light.  Neck: Neck supple.  Cardiovascular:  Tachycardia  Pulmonary/Chest:  Tachypnea  Abdominal: Soft.  Mild diffuse tenderness  Neurological: She is alert.  Skin: Skin is warm.    ED Course  Procedures (including critical care time) Labs Review Labs Reviewed  CBC WITH DIFFERENTIAL/PLATELET - Abnormal; Notable for the following:    WBC 10.9 (*)    RDW 16.6 (*)    Neutrophils Relative % 93 (*)    Lymphocytes Relative 3 (*)    Neutro Abs 10.2 (*)    Lymphs Abs 0.3 (*)    All other components within normal limits  BASIC METABOLIC PANEL - Abnormal; Notable for the following:    Sodium 132 (*)    Potassium 6.7 (*)    CO2 7 (*)    Glucose, Bld 657 (*)    BUN 31 (*)    Creatinine, Ser 1.34 (*)    GFR calc non Af Amer 39 (*)    GFR calc Af Amer 46 (*)    Anion gap 21 (*)    All other components within normal limits  HEPATIC FUNCTION PANEL - Abnormal; Notable for the following:    Alkaline Phosphatase 159 (*)    Total Bilirubin 2.3 (*)    Indirect Bilirubin 2.0 (*)    All other components within normal limits  URINALYSIS, ROUTINE W REFLEX  MICROSCOPIC - Abnormal; Notable for the following:    Glucose, UA 500 (*)    Hgb urine dipstick SMALL (*)    Bilirubin Urine SMALL (*)    Ketones, ur >80 (*)    Protein, ur TRACE (*)    All other components within normal limits  LACTIC ACID, PLASMA - Abnormal; Notable for the following:    Lactic Acid, Venous 2.5 (*)    All other components within normal limits  BLOOD GAS, ARTERIAL - Abnormal; Notable for the following:    pH, Arterial 6.974 (*)    pCO2 arterial 15.8 (*)  pO2, Arterial 133.0 (*)    Bicarbonate 3.5 (*)    Acid-base deficit 26.0 (*)    All other components within normal limits  CBG MONITORING, ED - Abnormal; Notable for the following:    Glucose-Capillary 575 (*)    All other components within normal limits  CBG MONITORING, ED - Abnormal; Notable for the following:    Glucose-Capillary 549 (*)    All other components within normal limits  MRSA PCR SCREENING  LIPASE, BLOOD  PROCALCITONIN  URINE MICROSCOPIC-ADD ON  LACTIC ACID, PLASMA  BASIC METABOLIC PANEL  BASIC METABOLIC PANEL  BASIC METABOLIC PANEL  BASIC METABOLIC PANEL  HEMOGLOBIN A1C  I-STAT TROPOININ, ED    Imaging Review Dg Chest Portable 1 View  09/27/2014   CLINICAL DATA:  Vomiting for 2 days.  Weakness.  EXAM: PORTABLE CHEST - 1 VIEW  COMPARISON:  PA and lateral chest 04/13/2011  FINDINGS: The lungs are clear. Heart size is normal. No pneumothorax or pleural effusion  IMPRESSION: No acute disease.   Electronically Signed   By: Inge Rise M.D.   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 She has renal sufficiency and an elevated potassium. Mildly peaked T waves on EKG. Bicarbonate is 7 and has a 24. Started on insulin drip and 2 L of fluid boluses given. Will admit to stepdown/ICU to internal medicine.  CRITICAL CARE Performed by: Mackie Pai Total critical care time: 30 Critical care time was exclusive of separately billable procedures and treating other patients. Critical care was necessary to treat or prevent imminent or life-threatening deterioration. Critical care was time spent personally by me on the following activities: development of treatment plan with patient and/or surrogate as well as nursing, discussions with consultants, evaluation of patient's response to treatment, examination of patient, obtaining history from patient or surrogate, ordering and performing treatments and interventions, ordering and review of laboratory studies, ordering and review of radiographic studies, pulse oximetry and re-evaluation of patient's condition.    Davonna Belling, MD 09/27/14 2146

## 2014-09-27 NOTE — ED Notes (Signed)
CRITICAL VALUE ALERT  Critical value received:lactic acid 2.5 Date of notification:  09/27/2014  Time of notification:  2009  Critical value read back:Yes.    Nurse who received alert:  Fabio Neighbors RN  MD notified (1st page):  Dr. Alvino Chapel @ 2011

## 2014-09-27 NOTE — ED Notes (Signed)
Vomiting times 2 days.  cbg with ems registered high.  Pt pale and diaphoretic. Ems gave zofran 4 mg  IV.

## 2014-09-27 NOTE — H&P (Signed)
PCP:   Asencion Noble, MD   Chief Complaint:  N/v/d, high sugar  HPI: 69 yo female h/o IDDM, htn comes in with several days of persistent n/v/d and has not been taking her insulin since she has been sick.  She is progressively getting more weak and glucose more high.  subj fevers.  Generalized abdominal pain.   No blood in vomit or diarrhea.  Several family members sick with similar symptoms over the last several days.  No rashes.  No focal neurological defects, pt in DKA.  Review of Systems:  Positive and negative as per HPI otherwise all other systems are negative  Past Medical History: Past Medical History  Diagnosis Date  . Diabetes mellitus   . Hypertension   . Arthritis   . Shingles   . hypothyroidism   . Bronchitis   . Hyperlipidemia   . GERD (gastroesophageal reflux disease)    Past Surgical History  Procedure Laterality Date  . Cholecystectomy    . Ankle surgery      x 4  . Non cancerous mass removed      small intestine  . Tonsillectomy    . Nasal sinus surgery    . Cataract extraction Bilateral   . Appendectomy    . Knee arthroscopy      Medications: Prior to Admission medications   Medication Sig Start Date End Date Taking? Authorizing Provider  acetaminophen (TYLENOL) 500 MG tablet Take 1,000 mg by mouth every 6 (six) hours as needed for headache.    Historical Provider, MD  acetaminophen-codeine (TYLENOL #3) 300-30 MG per tablet Take 1 tablet by mouth every 4 (four) hours as needed. For pain    Historical Provider, MD  carisoprodol (SOMA) 350 MG tablet Take 175-350 mg by mouth daily as needed for muscle spasms. For cramps    Historical Provider, MD  diphenhydramine-acetaminophen (TYLENOL PM) 25-500 MG TABS Take 0.5-1 tablets by mouth at bedtime as needed (sleep).    Historical Provider, MD  esomeprazole (NEXIUM) 40 MG capsule Take 40 mg by mouth daily before breakfast.      Historical Provider, MD  furosemide (LASIX) 20 MG tablet Take 20 mg by mouth daily as  needed for fluid.     Historical Provider, MD  insulin regular human CONCENTRATED (HUMULIN R) 500 UNIT/ML SOLN injection Inject 10-24 Units into the skin 3 (three) times daily with meals. Per sliding scale.    Historical Provider, MD  levothyroxine (SYNTHROID, LEVOTHROID) 125 MCG tablet Take 125 mcg by mouth daily.      Historical Provider, MD  PARoxetine (PAXIL) 30 MG tablet Take 30 mg by mouth daily.    Historical Provider, MD  Polyethylene Glycol 400 (BLINK TEARS OP) Place 2 drops into both eyes 3 (three) times daily.    Historical Provider, MD  quinapril (ACCUPRIL) 40 MG tablet Take 1 tablet (40 mg total) by mouth daily. 05/30/13   Burnell Blanks, MD  Senna-Psyllium (PERDIEM PO) Take 1 tablet by mouth daily.    Historical Provider, MD  simvastatin (ZOCOR) 20 MG tablet Take 20 mg by mouth at bedtime.      Historical Provider, MD    Allergies:   Allergies  Allergen Reactions  . Aspirin     REACTION: rash  . Erythromycin     REACTION: VOMITING  . Levofloxacin     REACTION: rash  . Mucinex [Guaifenesin Er] Nausea And Vomiting  . Sulfonamide Derivatives     REACTION: anaphylaxis    Social History:  reports that she has never smoked. She has never used smokeless tobacco. She reports that she does not drink alcohol or use illicit drugs.  Family History: Family History  Problem Relation Age of Onset  . Diabetes Mother   . Asthma Mother   . Hypertension Father   . Heart disease Father   . Diabetes Father   . Heart disease Sister   . Hypertension Sister   . Asthma Sister   . Diabetes Brother   . CAD Brother   . Stroke Brother     Physical Exam: Filed Vitals:   09/27/14 1830 09/27/14 1900  BP: 178/61 180/64  Pulse: 88 99  Resp: 25 20  SpO2: 100% 100%   General appearance: alert, cooperative and mild distress tachypneic Head: Normocephalic, without obvious abnormality, atraumatic Eyes: negative Nose: Nares normal. Septum midline. Mucosa normal. No drainage or  sinus tenderness. Neck: no JVD and supple, symmetrical, trachea midline Lungs: clear to auscultation bilaterally Heart: regular rate and rhythm, S1, S2 normal, no murmur, click, rub or gallop Abdomen: soft, non-tender; bowel sounds normal; no masses,  no organomegaly Extremities: extremities normal, atraumatic, no cyanosis or edema Pulses: 2+ and symmetric Skin: Skin color, texture, turgor normal. No rashes or lesions Neurologic: Grossly normal   Labs on Admission:   Recent Labs  09/27/14 1830  NA 132*  K 6.7*  CL 104  CO2 7*  GLUCOSE 657*  BUN 31*  CREATININE 1.34*  CALCIUM 8.7    Recent Labs  09/27/14 1830  AST 22  ALT 24  ALKPHOS 159*  BILITOT 2.3*  PROT 6.9  ALBUMIN 4.0    Recent Labs  09/27/14 1830  LIPASE 13    Recent Labs  09/27/14 1830  WBC 10.9*  NEUTROABS 10.2*  HGB 13.2  HCT 40.6  MCV 87.3  PLT 205   Radiological Exams on Admission: Dg Chest Portable 1 View  09/27/2014   CLINICAL DATA:  Vomiting for 2 days.  Weakness.  EXAM: PORTABLE CHEST - 1 VIEW  COMPARISON:  PA and lateral chest 04/13/2011  FINDINGS: The lungs are clear. Heart size is normal. No pneumothorax or pleural effusion  IMPRESSION: No acute disease.   Electronically Signed   By: Inge Rise M.D.   On: 09/27/2014 19:15    Assessment/Plan  68 yo female with acute GI illness likely viral gastroenteritis and DKA  Principal Problem:   DKA (diabetic ketoacidoses)-  Aggressive ivf.  Just received 2nd ivf liter bolus, will bolus 2 more liters.  Insulin gtt.  Admit to stepdown on dka pathway.  No source of infection, abd exam with generalized crampiness, nothing focal.  Bmp q 2 hours.  Quite acidotic, likely resolve with above treatment over next 8 hours or so.  Active Problems:   Nausea and vomiting-  VGE, supportive care   Diarrhea-  VGE, supportive care.  No diarrhea since arrival to ED reported   Hyperkalemia-  Monitor, should improve with ivf and resolution of dka   AKI  (acute kidney injury)-  Secondary to above, monitor.  Place foley cath.   Diabetes mellitus-  noted  Admit to stepdown.  Full code.  Adelin Ventrella A 09/27/2014, 7:34 PM

## 2014-09-27 NOTE — ED Notes (Addendum)
CRITICAL VALUE ALERT  Critical value received:  K+ 6.5, CO2 7 and glucose 657 Date of notification:  09/27/2014  Time of notification: 1912 Critical value read back:Yes.    Nurse who received alert:  LRT  MD notified (1st page):  Alvino Chapel Time of first page:  1912 MD notified (2nd page):  Time of second page:  Responding MD:  Alvino Chapel Time MD responded:  [phone]

## 2014-09-27 NOTE — Progress Notes (Signed)
eLink Physician-Brief Progress Note Patient Name: Jaclyn Herrera DOB: 1945-09-06 MRN: [id number]   Date of Service  09/27/2014  HPI/Events of Note  10 PM BMP - K+ = 5.9 (improved) and HCO3- = 5.  eICU Interventions  Will give NaHCO3 100 meq IV now and re-check ABG at 12 Midnight.      Intervention Category Major Interventions: Acid-Base disturbance - evaluation and management  Sommer,Steven Eugene 09/27/2014, 10:55 PM

## 2014-09-27 NOTE — Progress Notes (Signed)
eLink Physician-Brief Progress Note Patient Name: Jaclyn Herrera DOB: May 12, 1946 MRN: [id number]   Date of Service  09/27/2014  HPI/Events of Note  DKA. Initial pH noted to be 6.97, HCO3 = 7 and K+ = 6.7. On insulin IV infusion. BMP sent at 10 PM is pending result.   eICU Interventions  Await 10 PM BMP to follow resolution of hyperkalemia and acidosis.      Intervention Category Major Interventions: Acid-Base disturbance - evaluation and management  Sommer,Steven Eugene 09/27/2014, 10:38 PM

## 2014-09-27 NOTE — ED Notes (Signed)
CRITICAL VALUE ALERT  Critical value received:  ABG- ph 6.974, PCO2 15.8, PO2 133, HCO# 3.5, BE -28  Date of notification:  09/27/14  Time of notification:  2045  Critical value read back:Yes.    Nurse who received alert:  lrt  MD notified (1st page):  Alvino Chapel  Time of first page:  2046  MD notified (2nd page):  Time of second page:  Responding MD:  pickering Time MD responded: 2046

## 2014-09-28 LAB — GLUCOSE, CAPILLARY
GLUCOSE-CAPILLARY: 105 mg/dL — AB (ref 70–99)
GLUCOSE-CAPILLARY: 128 mg/dL — AB (ref 70–99)
GLUCOSE-CAPILLARY: 152 mg/dL — AB (ref 70–99)
GLUCOSE-CAPILLARY: 170 mg/dL — AB (ref 70–99)
GLUCOSE-CAPILLARY: 202 mg/dL — AB (ref 70–99)
GLUCOSE-CAPILLARY: 206 mg/dL — AB (ref 70–99)
GLUCOSE-CAPILLARY: 213 mg/dL — AB (ref 70–99)
GLUCOSE-CAPILLARY: 226 mg/dL — AB (ref 70–99)
GLUCOSE-CAPILLARY: 434 mg/dL — AB (ref 70–99)
Glucose-Capillary: 118 mg/dL — ABNORMAL HIGH (ref 70–99)
Glucose-Capillary: 139 mg/dL — ABNORMAL HIGH (ref 70–99)
Glucose-Capillary: 141 mg/dL — ABNORMAL HIGH (ref 70–99)
Glucose-Capillary: 149 mg/dL — ABNORMAL HIGH (ref 70–99)
Glucose-Capillary: 191 mg/dL — ABNORMAL HIGH (ref 70–99)
Glucose-Capillary: 193 mg/dL — ABNORMAL HIGH (ref 70–99)
Glucose-Capillary: 201 mg/dL — ABNORMAL HIGH (ref 70–99)
Glucose-Capillary: 202 mg/dL — ABNORMAL HIGH (ref 70–99)
Glucose-Capillary: 225 mg/dL — ABNORMAL HIGH (ref 70–99)
Glucose-Capillary: 324 mg/dL — ABNORMAL HIGH (ref 70–99)
Glucose-Capillary: 349 mg/dL — ABNORMAL HIGH (ref 70–99)

## 2014-09-28 LAB — BASIC METABOLIC PANEL
ANION GAP: 8 (ref 5–15)
ANION GAP: 8 (ref 5–15)
Anion gap: 14 (ref 5–15)
Anion gap: 11 (ref 5–15)
Anion gap: 8 (ref 5–15)
BUN: 27 mg/dL — ABNORMAL HIGH (ref 6–23)
BUN: 26 mg/dL — AB (ref 6–23)
BUN: 26 mg/dL — ABNORMAL HIGH (ref 6–23)
BUN: 24 mg/dL — ABNORMAL HIGH (ref 6–23)
BUN: 22 mg/dL (ref 6–23)
CALCIUM: 8.4 mg/dL (ref 8.4–10.5)
CALCIUM: 8.3 mg/dL — AB (ref 8.4–10.5)
CHLORIDE: 117 mmol/L — AB (ref 96–112)
CHLORIDE: 118 mmol/L — AB (ref 96–112)
CO2: 11 mmol/L — ABNORMAL LOW (ref 19–32)
CO2: 14 mmol/L — ABNORMAL LOW (ref 19–32)
CO2: 16 mmol/L — ABNORMAL LOW (ref 19–32)
CO2: 17 mmol/L — AB (ref 19–32)
CO2: 17 mmol/L — ABNORMAL LOW (ref 19–32)
Calcium: 8.5 mg/dL (ref 8.4–10.5)
Calcium: 8.3 mg/dL — ABNORMAL LOW (ref 8.4–10.5)
Calcium: 8.3 mg/dL — ABNORMAL LOW (ref 8.4–10.5)
Chloride: 118 mmol/L — ABNORMAL HIGH (ref 96–112)
Chloride: 116 mmol/L — ABNORMAL HIGH (ref 96–112)
Chloride: 113 mmol/L — ABNORMAL HIGH (ref 96–112)
Creatinine, Ser: 1.15 mg/dL — ABNORMAL HIGH (ref 0.50–1.10)
Creatinine, Ser: 0.95 mg/dL (ref 0.50–1.10)
Creatinine, Ser: 0.85 mg/dL (ref 0.50–1.10)
Creatinine, Ser: 0.76 mg/dL (ref 0.50–1.10)
Creatinine, Ser: 0.8 mg/dL (ref 0.50–1.10)
GFR calc Af Amer: 55 mL/min — ABNORMAL LOW (ref >90)
GFR calc Af Amer: 69 mL/min — ABNORMAL LOW (ref >90)
GFR calc Af Amer: 79 mL/min — ABNORMAL LOW (ref >90)
GFR calc Af Amer: 85 mL/min — ABNORMAL LOW (ref >90)
GFR calc non Af Amer: 47 mL/min — ABNORMAL LOW (ref >90)
GFR calc non Af Amer: 68 mL/min — ABNORMAL LOW (ref >90)
GFR calc non Af Amer: 84 mL/min — ABNORMAL LOW (ref >90)
GFR, EST NON AFRICAN AMERICAN: 60 mL/min — AB (ref >90)
GFR, EST NON AFRICAN AMERICAN: 74 mL/min — AB (ref >90)
GLUCOSE: 337 mg/dL — AB (ref 70–99)
Glucose, Bld: 217 mg/dL — ABNORMAL HIGH (ref 70–99)
Glucose, Bld: 178 mg/dL — ABNORMAL HIGH (ref 70–99)
Glucose, Bld: 170 mg/dL — ABNORMAL HIGH (ref 70–99)
Glucose, Bld: 171 mg/dL — ABNORMAL HIGH (ref 70–99)
POTASSIUM: 4.5 mmol/L (ref 3.5–5.1)
POTASSIUM: 3.4 mmol/L — AB (ref 3.5–5.1)
Potassium: 4 mmol/L (ref 3.5–5.1)
Potassium: 4 mmol/L (ref 3.5–5.1)
Potassium: 3.7 mmol/L (ref 3.5–5.1)
SODIUM: 142 mmol/L (ref 135–145)
SODIUM: 143 mmol/L (ref 135–145)
SODIUM: 142 mmol/L (ref 135–145)
SODIUM: 138 mmol/L (ref 135–145)
Sodium: 141 mmol/L (ref 135–145)

## 2014-09-28 LAB — BLOOD GAS, ARTERIAL
ACID-BASE DEFICIT: 19.2 mmol/L — AB (ref 0.0–2.0)
Acid-base deficit: 8.4 mmol/L — ABNORMAL HIGH (ref 0.0–2.0)
BICARBONATE: 7.3 meq/L — AB (ref 20.0–24.0)
Bicarbonate: 16.6 mEq/L — ABNORMAL LOW (ref 20.0–24.0)
Drawn by: 12971
Drawn by: 22223
FIO2: 0.21 %
FIO2: 21 %
O2 SAT: 96.3 %
O2 SAT: 97.5 %
PATIENT TEMPERATURE: 37
PCO2 ART: 18.5 mmHg — AB (ref 35.0–45.0)
PH ART: 7.219 — AB (ref 7.350–7.450)
Patient temperature: 37
TCO2: 15.4 mmol/L (ref 0–100)
TCO2: 6.9 mmol/L (ref 0–100)
pCO2 arterial: 33.5 mmHg — ABNORMAL LOW (ref 35.0–45.0)
pH, Arterial: 7.315 — ABNORMAL LOW (ref 7.350–7.450)
pO2, Arterial: 118 mmHg — ABNORMAL HIGH (ref 80.0–100.0)
pO2, Arterial: 84.2 mmHg (ref 80.0–100.0)

## 2014-09-28 LAB — MRSA PCR SCREENING: MRSA BY PCR: NEGATIVE

## 2014-09-28 MED ORDER — CEFTRIAXONE SODIUM IN DEXTROSE 20 MG/ML IV SOLN
1.0000 g | INTRAVENOUS | Status: DC
  Administered 2014-09-28: 1 g via INTRAVENOUS
  Filled 2014-09-28: qty 50

## 2014-09-28 MED ORDER — INSULIN ASPART 100 UNIT/ML ~~LOC~~ SOLN
0.0000 [IU] | SUBCUTANEOUS | Status: DC
  Administered 2014-09-28 – 2014-09-29 (×2): 8 [IU] via SUBCUTANEOUS
  Administered 2014-09-29: 2 [IU] via SUBCUTANEOUS
  Administered 2014-09-29: 4 [IU] via SUBCUTANEOUS

## 2014-09-28 MED ORDER — POTASSIUM CHLORIDE CRYS ER 20 MEQ PO TBCR
20.0000 meq | EXTENDED_RELEASE_TABLET | Freq: Three times a day (TID) | ORAL | Status: AC
  Administered 2014-09-28 – 2014-09-30 (×6): 20 meq via ORAL
  Filled 2014-09-28 (×6): qty 1

## 2014-09-28 MED ORDER — GUAIFENESIN-DM 100-10 MG/5ML PO SYRP
10.0000 mL | ORAL_SOLUTION | ORAL | Status: DC | PRN
Start: 2014-09-28 — End: 2014-10-01
  Administered 2014-09-28 – 2014-10-01 (×4): 10 mL via ORAL
  Filled 2014-09-28 (×4): qty 10

## 2014-09-28 MED ORDER — LEVOTHYROXINE SODIUM 25 MCG PO TABS
125.0000 ug | ORAL_TABLET | Freq: Every day | ORAL | Status: DC
  Administered 2014-09-28 – 2014-10-01 (×4): 125 ug via ORAL
  Filled 2014-09-28 (×8): qty 1

## 2014-09-28 MED ORDER — SODIUM CHLORIDE 0.9 % IV SOLN
INTRAVENOUS | Status: DC
  Administered 2014-09-28: 3 [IU]/h via INTRAVENOUS
  Filled 2014-09-28: qty 2.5

## 2014-09-28 MED ORDER — PANTOPRAZOLE SODIUM 40 MG PO TBEC
40.0000 mg | DELAYED_RELEASE_TABLET | Freq: Every day | ORAL | Status: DC
  Administered 2014-09-28 – 2014-10-01 (×4): 40 mg via ORAL
  Filled 2014-09-28 (×4): qty 1

## 2014-09-28 MED ORDER — PAROXETINE HCL 20 MG PO TABS
30.0000 mg | ORAL_TABLET | Freq: Every day | ORAL | Status: DC
  Administered 2014-09-28 – 2014-10-01 (×4): 30 mg via ORAL
  Filled 2014-09-28 (×4): qty 2

## 2014-09-28 MED ORDER — POTASSIUM CHLORIDE 10 MEQ/100ML IV SOLN
10.0000 meq | INTRAVENOUS | Status: AC
  Administered 2014-09-28 (×2): 10 meq via INTRAVENOUS
  Filled 2014-09-28 (×2): qty 100

## 2014-09-28 MED ORDER — DEXTROSE 5 % IV SOLN
INTRAVENOUS | Status: AC
  Filled 2014-09-28: qty 10

## 2014-09-28 MED ORDER — ACETAMINOPHEN 325 MG PO TABS
650.0000 mg | ORAL_TABLET | Freq: Four times a day (QID) | ORAL | Status: DC | PRN
  Administered 2014-09-28 – 2014-09-29 (×2): 650 mg via ORAL
  Filled 2014-09-28 (×2): qty 2

## 2014-09-28 NOTE — Progress Notes (Signed)
Patient was admitted on 09/27/14 with DKA with an initial lab glucose of 657 mg/dl. Patient remains on an insulin drip at this time and according to the labs on 09/28/14 at 13:27 CO2 is 17 and AG 8. Over the past 9 hours CBGs have ranged from 105-226 mg/dl and patient has required a total of 97 units of insulin over the past 9 hours on the insulin drip to maintain glucose within those ranges. May want to consider continuing insulin drip for 24 hours from time glucose in target range to determine actual insulin needs. Also, if MD wants patient to resume U500 at time of transition off IV insulin then please ask patient to have family bring U500 to the hospital so it can be used while she is inpatient since U500 is not on hospital formulary.     Patient reports that she still feels nauseated and does not have an appetite at this point. Spoke with patient about diabetes and home regimen for diabetes control. According to the patient she just recently moved here from [place] and she was being followed by her PCP (Dr. Legrand Como Bougoulais in [facility]) for diabetes management. Patient reports that she is now followed by Dr. Carrolyn Meiers (Endocrinologist in [place]) for diabetes management and currently she takes Humulin R U500 10-24 units TID with meals as an outpatient for diabetes control. Patient reports that she uses a correction scale based on glucose to determine how much U500 to take. In talking with the patient she admits that she made a mistake by not taking her U500 while she was sick. Patient reports that she has talked with her doctor about sick day rules and has been instructed to continue taking the U500 but she was not taking any insulin at all while she was sick which resulted in the elevated glucose noted at time of admission. Inquired about knowledge about A1C and patient reports that her last A1C was 6.2%. Discussed impact of nutrition, exercise, stress, sickness, and medications on diabetes control.    Stressed importance of closer glucose monitoring during sickness and the need for continuing insulin during time of illness. Encouraged patient to talk with Dr. Forde Dandy for instructions on dosages of U500 during illness. Inquired about insulin taken prior to starting on U500 and patient states that she was on 70/30 insulin before being put on U500 years ago but she does not recall how much 70/30 she was taking. According to the patient her "sugar runs great when I take that U500 like she should". Patient verbalized understanding of information discussed and she states that she has no further questions at this time related to diabetes.    Thanks, Barnie Alderman, RN, MSN, CCRN, CDE Diabetes Coordinator Inpatient Diabetes Program [phone] (Team Pager from 8 am to 5 pm) [phone] (AP office) [phone] [facility] office)

## 2014-09-28 NOTE — Progress Notes (Addendum)
CRITICAL VALUE ALERT  Critical value received:  K 5.9 CO2 5 Glucose 586  Date of notification:  09/27/14  Time of notification:  2255  Critical value read back: yes  Nurse who received alert:  Candiss Norse RN   MD notified (1st page):  Dr. Margarite Gouge E-Link  Time of first page:  2255  MD notified (2nd page):  Time of second page:  Responding MD:  Dr. Margarite Gouge E-Link  Time MD responded:  2255  Bicarb given and ABG at 09/28/14 0000;  And call back to E-link with results.

## 2014-09-28 NOTE — Care Management Utilization Note (Signed)
UR completed 

## 2014-09-28 NOTE — Progress Notes (Signed)
Subjective: Jaclyn Herrera was admitted yesterday with diabetic ketoacidosis. Had experienced symptoms of a probable gastroenteritis. Her hyperglycemia has significantly improved. Her insulin doses down to 7.4 units per hour. Her acidosis has improved. She denies any nausea or vomiting now. She denies any pain.  Objective: Vital signs in last 24 hours: Filed Vitals:   09/28/14 0715 09/28/14 0730 09/28/14 0745 09/28/14 0751  BP: 158/50 159/49 156/52   Pulse: 78 79 81 81  Temp:    98.1 F (36.7 C)  TempSrc:    Oral  Resp: 20 18 20 20   Height:      Weight:      SpO2: 99% 99% 99% 99%   Weight change:   Intake/Output Summary (Last 24 hours) at 09/28/14 0752 Last data filed at 09/28/14 0730  Gross per 24 hour  Intake 4932.83 ml  Output   2600 ml  Net 2332.83 ml    Physical Exam: Alert. Weak appearing. Lungs clear. Heart regular with no murmurs. Abdomen is soft and nontender with no palpable organomegaly. Extremities reveal trace pedal edema. Neuro grossly intact.  Lab Results:    Results for orders placed or performed during the hospital encounter of 09/27/14 (from the past 24 hour(s))  CBG monitoring, ED     Status: Abnormal   Collection Time: 09/27/14  6:14 PM  Result Value Ref Range   Glucose-Capillary 575 (HH) 70 - 99 mg/dL  CBC with Differential     Status: Abnormal   Collection Time: 09/27/14  6:30 PM  Result Value Ref Range   WBC 10.9 (H) 4.0 - 10.5 K/uL   RBC 4.65 3.87 - 5.11 MIL/uL   Hemoglobin 13.2 12.0 - 15.0 g/dL   HCT 40.6 36.0 - 46.0 %   MCV 87.3 78.0 - 100.0 fL   MCH 28.4 26.0 - 34.0 pg   MCHC 32.5 30.0 - 36.0 g/dL   RDW 16.6 (H) 11.5 - 15.5 %   Platelets 205 150 - 400 K/uL   Neutrophils Relative % 93 (H) 43 - 77 %   Lymphocytes Relative 3 (L) 12 - 46 %   Monocytes Relative 4 3 - 12 %   Eosinophils Relative 0 0 - 5 %   Basophils Relative 0 0 - 1 %   Band Neutrophils 0 0 - 10 %   Metamyelocytes Relative 0 %   Myelocytes 0 %   Promyelocytes Absolute 0 %    Blasts 0 %   nRBC 0 0 /100 WBC   Neutro Abs 10.2 (H) 1.7 - 7.7 K/uL   Lymphs Abs 0.3 (L) 0.7 - 4.0 K/uL   Monocytes Absolute 0.4 0.1 - 1.0 K/uL   Eosinophils Absolute 0.0 0.0 - 0.7 K/uL   Basophils Absolute 0.0 0.0 - 0.1 K/uL  Basic metabolic panel     Status: Abnormal   Collection Time: 09/27/14  6:30 PM  Result Value Ref Range   Sodium 132 (L) 135 - 145 mmol/L   Potassium 6.7 (HH) 3.5 - 5.1 mmol/L   Chloride 104 96 - 112 mmol/L   CO2 7 (LL) 19 - 32 mmol/L   Glucose, Bld 657 (HH) 70 - 99 mg/dL   BUN 31 (H) 6 - 23 mg/dL   Creatinine, Ser 1.34 (H) 0.50 - 1.10 mg/dL   Calcium 8.7 8.4 - 10.5 mg/dL   GFR calc non Af Amer 39 (L) >90 mL/min   GFR calc Af Amer 46 (L) >90 mL/min   Anion gap 21 (H) 5 - 15  Hepatic function  panel     Status: Abnormal   Collection Time: 09/27/14  6:30 PM  Result Value Ref Range   Total Protein 6.9 6.0 - 8.3 g/dL   Albumin 4.0 3.5 - 5.2 g/dL   AST 22 0 - 37 U/L   ALT 24 0 - 35 U/L   Alkaline Phosphatase 159 (H) 39 - 117 U/L   Total Bilirubin 2.3 (H) 0.3 - 1.2 mg/dL   Bilirubin, Direct 0.3 0.0 - 0.5 mg/dL   Indirect Bilirubin 2.0 (H) 0.3 - 0.9 mg/dL  Lipase, blood     Status: None   Collection Time: 09/27/14  6:30 PM  Result Value Ref Range   Lipase 13 11 - 59 U/L  Lactic acid, plasma     Status: Abnormal   Collection Time: 09/27/14  6:30 PM  Result Value Ref Range   Lactic Acid, Venous 2.5 (HH) 0.5 - 2.0 mmol/L  Procalcitonin - Baseline     Status: None   Collection Time: 09/27/14  6:30 PM  Result Value Ref Range   Procalcitonin 0.21 ng/mL  Urinalysis, Routine w reflex microscopic     Status: Abnormal   Collection Time: 09/27/14  7:47 PM  Result Value Ref Range   Color, Urine YELLOW YELLOW   APPearance CLEAR CLEAR   Specific Gravity, Urine 1.025 1.005 - 1.030   pH 5.5 5.0 - 8.0   Glucose, UA 500 (A) NEGATIVE mg/dL   Hgb urine dipstick SMALL (A) NEGATIVE   Bilirubin Urine SMALL (A) NEGATIVE   Ketones, ur >80 (A) NEGATIVE mg/dL   Protein, ur  TRACE (A) NEGATIVE mg/dL   Urobilinogen, UA 0.2 0.0 - 1.0 mg/dL   Nitrite NEGATIVE NEGATIVE   Leukocytes, UA NEGATIVE NEGATIVE  Urine microscopic-add on     Status: None   Collection Time: 09/27/14  7:47 PM  Result Value Ref Range   Squamous Epithelial / LPF RARE RARE   WBC, UA 0-2 <3 WBC/hpf   RBC / HPF 0-2 <3 RBC/hpf   Bacteria, UA RARE RARE  CBG monitoring, ED     Status: Abnormal   Collection Time: 09/27/14  7:55 PM  Result Value Ref Range   Glucose-Capillary 549 (H) 70 - 99 mg/dL  I-stat troponin, ED     Status: None   Collection Time: 09/27/14  8:20 PM  Result Value Ref Range   Troponin i, poc 0.01 0.00 - 0.08 ng/mL   Comment 3          Blood gas, arterial     Status: Abnormal   Collection Time: 09/27/14  8:35 PM  Result Value Ref Range   FIO2 0.24 %   Delivery systems NASAL CANNULA    pH, Arterial 6.974 (LL) 7.350 - 7.450   pCO2 arterial 15.8 (LL) 35.0 - 45.0 mmHg   pO2, Arterial 133.0 (H) 80.0 - 100.0 mmHg   Bicarbonate 3.5 (L) 20.0 - 24.0 mEq/L   TCO2 3.5 0 - 100 mmol/L   Acid-base deficit 26.0 (H) 0.0 - 2.0 mmol/L   O2 Saturation 96.3 %   Patient temperature 37.0    Collection site RADIAL    Drawn by [id number]    Sample type ARTERIAL    Allens test (pass/fail) PASS PASS  MRSA PCR Screening     Status: None   Collection Time: 09/27/14  9:05 PM  Result Value Ref Range   MRSA by PCR NEGATIVE NEGATIVE  Glucose, capillary     Status: Abnormal   Collection Time: 09/27/14  9:19 PM  Result Value Ref Range   Glucose-Capillary 484 (H) 70 - 99 mg/dL  Lactic acid, plasma     Status: Abnormal   Collection Time: 09/27/14  9:53 PM  Result Value Ref Range   Lactic Acid, Venous 2.5 (HH) 0.5 - 2.0 mmol/L  Basic metabolic panel (stat then every 4 hours)     Status: Abnormal   Collection Time: 09/27/14  9:53 PM  Result Value Ref Range   Sodium 136 135 - 145 mmol/L   Potassium 5.9 (H) 3.5 - 5.1 mmol/L   Chloride 113 (H) 96 - 112 mmol/L   CO2 5 (LL) 19 - 32 mmol/L   Glucose,  Bld 586 (HH) 70 - 99 mg/dL   BUN 29 (H) 6 - 23 mg/dL   Creatinine, Ser 1.28 (H) 0.50 - 1.10 mg/dL   Calcium 8.3 (L) 8.4 - 10.5 mg/dL   GFR calc non Af Amer 42 (L) >90 mL/min   GFR calc Af Amer 48 (L) >90 mL/min   Anion gap 18 (H) 5 - 15  Glucose, capillary     Status: Abnormal   Collection Time: 09/27/14 10:21 PM  Result Value Ref Range   Glucose-Capillary 519 (H) 70 - 99 mg/dL   Comment 1 Notify RN   Glucose, capillary     Status: Abnormal   Collection Time: 09/27/14 11:30 PM  Result Value Ref Range   Glucose-Capillary 474 (H) 70 - 99 mg/dL   Comment 1 Notify RN   Blood gas, arterial     Status: Abnormal   Collection Time: 09/28/14 12:00 AM  Result Value Ref Range   FIO2 21.00 %   Delivery systems ROOM AIR    pH, Arterial 7.219 (L) 7.350 - 7.450   pCO2 arterial 18.5 (LL) 35.0 - 45.0 mmHg   pO2, Arterial 118.0 (H) 80.0 - 100.0 mmHg   Bicarbonate 7.3 (L) 20.0 - 24.0 mEq/L   TCO2 6.9 0 - 100 mmol/L   Acid-base deficit 19.2 (H) 0.0 - 2.0 mmol/L   O2 Saturation 97.5 %   Patient temperature 37.0    Collection site RIGHT RADIAL    Drawn by [id number]    Sample type ARTERIAL    Allens test (pass/fail) PASS PASS  Glucose, capillary     Status: Abnormal   Collection Time: 09/28/14 12:42 AM  Result Value Ref Range   Glucose-Capillary 434 (H) 70 - 99 mg/dL   Comment 1 Notify RN   Glucose, capillary     Status: Abnormal   Collection Time: 09/28/14  1:47 AM  Result Value Ref Range   Glucose-Capillary 349 (H) 70 - 99 mg/dL   Comment 1 Notify RN   Basic metabolic panel (stat then every 4 hours)     Status: Abnormal   Collection Time: 09/28/14  2:18 AM  Result Value Ref Range   Sodium 142 135 - 145 mmol/L   Potassium 4.5 3.5 - 5.1 mmol/L   Chloride 117 (H) 96 - 112 mmol/L   CO2 11 (L) 19 - 32 mmol/L   Glucose, Bld 337 (H) 70 - 99 mg/dL   BUN 27 (H) 6 - 23 mg/dL   Creatinine, Ser 1.15 (H) 0.50 - 1.10 mg/dL   Calcium 8.5 8.4 - 10.5 mg/dL   GFR calc non Af Amer 47 (L) >90 mL/min   GFR  calc Af Amer 55 (L) >90 mL/min   Anion gap 14 5 - 15  Glucose, capillary     Status: Abnormal   Collection Time: 09/28/14  2:49 AM  Result Value Ref Range   Glucose-Capillary 324 (H) 70 - 99 mg/dL   Comment 1 Notify RN   Glucose, capillary     Status: Abnormal   Collection Time: 09/28/14  3:48 AM  Result Value Ref Range   Glucose-Capillary 206 (H) 70 - 99 mg/dL   Comment 1 Notify RN   Glucose, capillary     Status: Abnormal   Collection Time: 09/28/14  4:50 AM  Result Value Ref Range   Glucose-Capillary 225 (H) 70 - 99 mg/dL   Comment 1 Notify RN   Basic metabolic panel (stat then every 4 hours)     Status: Abnormal   Collection Time: 09/28/14  5:06 AM  Result Value Ref Range   Sodium 143 135 - 145 mmol/L   Potassium 4.0 3.5 - 5.1 mmol/L   Chloride 118 (H) 96 - 112 mmol/L   CO2 14 (L) 19 - 32 mmol/L   Glucose, Bld 217 (H) 70 - 99 mg/dL   BUN 26 (H) 6 - 23 mg/dL   Creatinine, Ser 0.95 0.50 - 1.10 mg/dL   Calcium 8.4 8.4 - 10.5 mg/dL   GFR calc non Af Amer 60 (L) >90 mL/min   GFR calc Af Amer 69 (L) >90 mL/min   Anion gap 11 5 - 15  Glucose, capillary     Status: Abnormal   Collection Time: 09/28/14  5:56 AM  Result Value Ref Range   Glucose-Capillary 191 (H) 70 - 99 mg/dL   Comment 1 Notify RN   Glucose, capillary     Status: Abnormal   Collection Time: 09/28/14  6:45 AM  Result Value Ref Range   Glucose-Capillary 152 (H) 70 - 99 mg/dL   Comment 1 Notify RN   Glucose, capillary     Status: Abnormal   Collection Time: 09/28/14  7:45 AM  Result Value Ref Range   Glucose-Capillary 141 (H) 70 - 99 mg/dL     ABGS  Recent Labs  09/28/14  PHART 7.219*  PO2ART 118.0*  TCO2 6.9  HCO3 7.3*   CULTURES Recent Results (from the past 240 hour(s))  MRSA PCR Screening     Status: None   Collection Time: 09/27/14  9:05 PM  Result Value Ref Range Status   MRSA by PCR NEGATIVE NEGATIVE Final    Comment:        The GeneXpert MRSA Assay (FDA approved for NASAL  specimens only), is one component of a comprehensive MRSA colonization surveillance program. It is not intended to diagnose MRSA infection nor to guide or monitor treatment for MRSA infections.    Studies/Results: Dg Chest Portable 1 View  09/27/2014   CLINICAL DATA:  Vomiting for 2 days.  Weakness.  EXAM: PORTABLE CHEST - 1 VIEW  COMPARISON:  PA and lateral chest 04/13/2011  FINDINGS: The lungs are clear. Heart size is normal. No pneumothorax or pleural effusion  IMPRESSION: No acute disease.   Electronically Signed   By: Inge Rise M.D.   On: 09/27/2014 19:15   Micro Results: Recent Results (from the past 240 hour(s))  MRSA PCR Screening     Status: None   Collection Time: 09/27/14  9:05 PM  Result Value Ref Range Status   MRSA by PCR NEGATIVE NEGATIVE Final    Comment:        The GeneXpert MRSA Assay (FDA approved for NASAL specimens only), is one component of a comprehensive MRSA colonization surveillance program. It is not intended to diagnose MRSA  infection nor to guide or monitor treatment for MRSA infections.    Studies/Results: Dg Chest Portable 1 View  09/27/2014   CLINICAL DATA:  Vomiting for 2 days.  Weakness.  EXAM: PORTABLE CHEST - 1 VIEW  COMPARISON:  PA and lateral chest 04/13/2011  FINDINGS: The lungs are clear. Heart size is normal. No pneumothorax or pleural effusion  IMPRESSION: No acute disease.   Electronically Signed   By: Inge Rise M.D.   On: 09/27/2014 19:15   Medications:  I have reviewed the patient's current medications Scheduled Meds: . sodium chloride   Intravenous STAT  . antiseptic oral rinse  7 mL Mouth Rinse BID  . enoxaparin (LOVENOX) injection  40 mg Subcutaneous Q24H   Continuous Infusions: . sodium chloride Stopped (09/28/14 0355)  . dextrose 5 % and 0.45% NaCl 100 mL/hr at 09/28/14 0730  . insulin (NOVOLIN-R) infusion 6.5 Units/hr (09/28/14 0746)   PRN Meds:.hydrALAZINE, ondansetron (ZOFRAN)  IV   Assessment/Plan: #1. Diabetic ketoacidosis. Continue insulin protocol. Continue IV fluids. Gradually improving. Clear liquid diet. #2. Gastroenteritis. Improved. #3. Hyperkalemia. Normalized to 4.0. #4. Acute kidney injury. Creatinine is normalized. Principal Problem:   DKA (diabetic ketoacidoses) Active Problems:   Nausea and vomiting   Diarrhea   Hyperkalemia   AKI (acute kidney injury)   Diabetes mellitus     LOS: 1 day   Jaclyn Herrera 09/28/2014, 7:52 AM

## 2014-09-28 NOTE — Progress Notes (Signed)
CRITICAL VALUE ALERT  Critical value received:  ABG 7.219 pCO2 18.5 pO2 118 Bicarb 7.3 Base Excess -19.2  Date of notification:  09/27/14  Time of notification:  2325  Critical value read back: yes  Nurse who received alert:  S. Hammock RN/T. Hassell Done RN  MD notified (1st page):  Dr. Elsworth Soho E-Link  Time of first page:  2330  MD notified (2nd page): Dr. Elsworth Soho E-Link  Time of second page: [phone]  Responding MD:  Dr. Sondra Come via Luevenia Maxin RN  Time MD responded:  [phone]  No Bicarb at this time

## 2014-09-28 NOTE — Progress Notes (Signed)
INITIAL NUTRITION ASSESSMENT  DOCUMENTATION CODES Per approved criteria  -Obesity Unspecified   INTERVENTION: -Snacks per pt request  NUTRITION DIAGNOSIS: Inadequate oral intake related to decrease in appetite as evidenced by a loss of 10 pound in 2.5 months.   Goal: Pt to meet >/= 90% of their estimated nutrition needs   Monitor:  Oral intake, snack preferences, labs  Reason for Assessment: MST  69 y.o. female  Admitting Dx: DKA (diabetic ketoacidoses)  ASSESSMENT: 69 yo female h/o IDDM, htn, hld, gerd comes in with several days of persistent n/v/d and has not been taking her insulin since she has been sick. She is progressively getting more weak and glucose more high. subj fevers. Generalized abdominal pain. pt in DKA.  Pt reports a decreased appetite of for a couple weaks now. She seemed to be a bit dazed when I talked to her as she reported no n/v/c/d even though she had reported that initially. She says recently she has been very busy and under some stress and as a result she hasnt been eating as much.   Reported taking no vitamins/minerals. Was interested in HS snack  Nutrition Focused Physical Exam: No wasting all areas. No edema noted   Height: Ht Readings from Last 1 Encounters:  09/27/14 5\' 8"  (1.727 m)    Weight: Wt Readings from Last 1 Encounters:  09/28/14 214 lb 1.1 oz (97.1 kg)    Ideal Body Weight: 140 lbs  % Ideal Body Weight: 153%  Wt Readings from Last 10 Encounters:  09/28/14 214 lb 1.1 oz (97.1 kg)  07/14/13 224 lb (101.606 kg)  05/30/13 226 lb (102.513 kg)  05/22/13 228 lb (103.42 kg)  04/30/13 227 lb (102.967 kg)  02/07/11 253 lb (114.76 kg)  12/18/08 244 lb 4 oz (110.791 kg)    Usual Body Weight: 240  % Usual Body Weight: 89%  BMI:  Body mass index is 32.56 kg/(m^2).  Estimated Nutritional Needs: Kcal: 1650-1750 (17-18 kcal/kg) Protein: >80 g Pro (1.25 g/kg) Fluid: Per MD  Skin: Ecchymosis + Petechiae  Diet Order:  Diet clear liquid Room service appropriate?: Yes; Fluid consistency:: Thin  EDUCATION NEEDS: -No education needs identified at this time   Intake/Output Summary (Last 24 hours) at 09/28/14 1325 Last data filed at 09/28/14 1300  Gross per 24 hour  Intake 5597.44 ml  Output   3050 ml  Net 2547.44 ml    Last BM: 4/17  Labs:   Recent Labs Lab 09/28/14 0218 09/28/14 0506 09/28/14 0853  NA 142 143 142  K 4.5 4.0 4.0  CL 117* 118* 118*  CO2 11* 14* 16*  BUN 27* 26* 26*  CREATININE 1.15* 0.95 0.85  CALCIUM 8.5 8.4 8.3*  GLUCOSE 337* 217* 178*    CBG (last 3)   Recent Labs  09/28/14 1047 09/28/14 1144 09/28/14 1245  GLUCAP 213* 193* 226*    Scheduled Meds: . antiseptic oral rinse  7 mL Mouth Rinse BID  . enoxaparin (LOVENOX) injection  40 mg Subcutaneous Q24H  . levothyroxine  125 mcg Oral QAC breakfast  . pantoprazole  40 mg Oral Daily  . PARoxetine  30 mg Oral Daily    Continuous Infusions: . sodium chloride Stopped (09/28/14 0355)  . dextrose 5 % and 0.45% NaCl 100 mL/hr at 09/28/14 1000  . insulin (NOVOLIN-R) infusion 18.3 Units/hr (09/28/14 1248)    Past Medical History  Diagnosis Date  . Diabetes mellitus   . Hypertension   . Arthritis   . Shingles   .  hypothyroidism   . Bronchitis   . Hyperlipidemia   . GERD (gastroesophageal reflux disease)     Past Surgical History  Procedure Laterality Date  . Cholecystectomy    . Ankle surgery      x 4  . Non cancerous mass removed      small intestine  . Tonsillectomy    . Nasal sinus surgery    . Cataract extraction Bilateral   . Appendectomy    . Knee arthroscopy      Burtis Junes RD, LDN Nutrition Pager: [phone] 09/28/2014 1:25 PM

## 2014-09-29 ENCOUNTER — Inpatient Hospital Stay (HOSPITAL_COMMUNITY): Payer: Medicare Other

## 2014-09-29 LAB — HEMOGLOBIN A1C
HEMOGLOBIN A1C: 9.4 % — AB (ref 4.8–5.6)
MEAN PLASMA GLUCOSE: 223 mg/dL

## 2014-09-29 LAB — GLUCOSE, CAPILLARY
GLUCOSE-CAPILLARY: 154 mg/dL — AB (ref 70–99)
GLUCOSE-CAPILLARY: 367 mg/dL — AB (ref 70–99)
GLUCOSE-CAPILLARY: 331 mg/dL — AB (ref 70–99)
Glucose-Capillary: 191 mg/dL — ABNORMAL HIGH (ref 70–99)
Glucose-Capillary: 314 mg/dL — ABNORMAL HIGH (ref 70–99)

## 2014-09-29 LAB — BASIC METABOLIC PANEL
Anion gap: 7 (ref 5–15)
BUN: 18 mg/dL (ref 6–23)
CHLORIDE: 110 mmol/L (ref 96–112)
CO2: 18 mmol/L — AB (ref 19–32)
CREATININE: 0.66 mg/dL (ref 0.50–1.10)
Calcium: 8.3 mg/dL — ABNORMAL LOW (ref 8.4–10.5)
GFR calc non Af Amer: 88 mL/min — ABNORMAL LOW (ref >90)
GLUCOSE: 180 mg/dL — AB (ref 70–99)
POTASSIUM: 3.5 mmol/L (ref 3.5–5.1)
SODIUM: 135 mmol/L (ref 135–145)

## 2014-09-29 LAB — EXPECTORATED SPUTUM ASSESSMENT W GRAM STAIN, RFLX TO RESP C

## 2014-09-29 LAB — PROCALCITONIN: Procalcitonin: 0.15 ng/mL

## 2014-09-29 LAB — CLOSTRIDIUM DIFFICILE BY PCR: Toxigenic C. Difficile by PCR: NEGATIVE

## 2014-09-29 LAB — EXPECTORATED SPUTUM ASSESSMENT W REFEX TO RESP CULTURE

## 2014-09-29 MED ORDER — SODIUM CHLORIDE 0.9 % IV SOLN
INTRAVENOUS | Status: DC
  Administered 2014-09-29 – 2014-09-30 (×2): via INTRAVENOUS
  Administered 2014-10-01: 1 mL via INTRAVENOUS

## 2014-09-29 MED ORDER — LISINOPRIL 10 MG PO TABS
40.0000 mg | ORAL_TABLET | Freq: Every day | ORAL | Status: DC
  Administered 2014-09-29 – 2014-10-01 (×3): 40 mg via ORAL
  Filled 2014-09-29 (×3): qty 4

## 2014-09-29 MED ORDER — INSULIN ASPART 100 UNIT/ML ~~LOC~~ SOLN
0.0000 [IU] | Freq: Three times a day (TID) | SUBCUTANEOUS | Status: DC
  Administered 2014-09-29: 20 [IU] via SUBCUTANEOUS
  Administered 2014-09-29 – 2014-09-30 (×2): 15 [IU] via SUBCUTANEOUS
  Administered 2014-09-30: 11 [IU] via SUBCUTANEOUS

## 2014-09-29 MED ORDER — DEXTROSE 5 % IV SOLN
1.0000 g | INTRAVENOUS | Status: DC
  Administered 2014-09-29 – 2014-09-30 (×2): 1 g via INTRAVENOUS
  Filled 2014-09-29 (×3): qty 10

## 2014-09-29 MED ORDER — INSULIN GLARGINE 100 UNIT/ML ~~LOC~~ SOLN
20.0000 [IU] | Freq: Once | SUBCUTANEOUS | Status: AC
  Administered 2014-09-29: 20 [IU] via SUBCUTANEOUS
  Filled 2014-09-29: qty 0.2

## 2014-09-29 MED ORDER — INSULIN GLARGINE 100 UNIT/ML ~~LOC~~ SOLN
30.0000 [IU] | Freq: Every day | SUBCUTANEOUS | Status: DC
  Administered 2014-09-29 – 2014-09-30 (×2): 30 [IU] via SUBCUTANEOUS
  Filled 2014-09-29 (×3): qty 0.3

## 2014-09-29 NOTE — Care Management Note (Addendum)
    Page 1 of 1   10/01/2014     8:03:54 AM CARE MANAGEMENT NOTE 10/01/2014  Patient:  Jaclyn Herrera, Jaclyn Herrera   Account Number:  [id number]  Date Initiated:  09/29/2014  Documentation initiated by:  Jolene Provost  Subjective/Objective Assessment:   Pt is from home, lives alone. pt has cane and walker for PRN use. Pt has neb machine. Pt plans to dsicharge home with self care. No CM needs.     Action/Plan:   Anticipated DC Date:  10/01/2014   Anticipated DC Plan:  [place]  CM consult      Choice offered to / List presented to:             Status of service:  Completed, signed off Medicare Important Message given?  YES (If response is "NO", the following Medicare IM given date fields will be blank) Date Medicare IM given:  10/01/2014 Medicare IM given by:  Theophilus Kinds Date Additional Medicare IM given:   Additional Medicare IM given by:    Discharge Disposition:  HOME/SELF CARE  Per UR Regulation:  Reviewed for med. necessity/level of care/duration of stay  If discussed at [place] of Stay Meetings, dates discussed:    Comments:  10/01/14 0800 Christinia Gully, RN BSN CM Pt discharged home today. No CM needs noted.  09/29/2014 [place], RN, MSN, CM

## 2014-09-29 NOTE — Progress Notes (Signed)
Patient transferred to room 339. Report given to Alma Downs RN. Vital signs stable at transfer.

## 2014-09-29 NOTE — Progress Notes (Signed)
Subjective: Jaclyn Herrera complains of cough. A sputum culture was obtained last night. Glucoses have been well controlled with her insulin drip and IV fluids. Acidosis has improved. No further nausea or vomiting. She has had a low-grade fever.  Objective: Vital signs in last 24 hours: Filed Vitals:   09/29/14 0400 09/29/14 0500 09/29/14 0600 09/29/14 0700  BP: 174/48  165/45 154/49  Pulse: 103 93 94 88  Temp:      TempSrc:      Resp: 29 21 27 25   Height:      Weight:      SpO2: 100% 99% 100% 99%   Weight change: -26 lb 6 oz (-11.963 kg)  Intake/Output Summary (Last 24 hours) at 09/29/14 0740 Last data filed at 09/29/14 0600  Gross per 24 hour  Intake 4012.87 ml  Output   1450 ml  Net 2562.87 ml    Physical Exam: Alert. No distress. Hypertensive. Lungs clear. Oxygen saturations normal on room air. Heart regular with no murmurs. Abdomen is soft and nontender. Extremities reveal no edema.  Lab Results:    Results for orders placed or performed during the hospital encounter of 09/27/14 (from the past 24 hour(s))  Glucose, capillary     Status: Abnormal   Collection Time: 09/28/14  7:45 AM  Result Value Ref Range   Glucose-Capillary 141 (H) 70 - 99 mg/dL  Glucose, capillary     Status: Abnormal   Collection Time: 09/28/14  8:38 AM  Result Value Ref Range   Glucose-Capillary 170 (H) 70 - 99 mg/dL  Basic metabolic panel (stat then every 4 hours)     Status: Abnormal   Collection Time: 09/28/14  8:53 AM  Result Value Ref Range   Sodium 142 135 - 145 mmol/L   Potassium 4.0 3.5 - 5.1 mmol/L   Chloride 118 (H) 96 - 112 mmol/L   CO2 16 (L) 19 - 32 mmol/L   Glucose, Bld 178 (H) 70 - 99 mg/dL   BUN 26 (H) 6 - 23 mg/dL   Creatinine, Ser 0.85 0.50 - 1.10 mg/dL   Calcium 8.3 (L) 8.4 - 10.5 mg/dL   GFR calc non Af Amer 68 (L) >90 mL/min   GFR calc Af Amer 79 (L) >90 mL/min   Anion gap 8 5 - 15  Blood gas, arterial     Status: Abnormal   Collection Time: 09/28/14  9:15 AM  Result Value  Ref Range   FIO2 0.21 %   pH, Arterial 7.315 (L) 7.350 - 7.450   pCO2 arterial 33.5 (L) 35.0 - 45.0 mmHg   pO2, Arterial 84.2 80.0 - 100.0 mmHg   Bicarbonate 16.6 (L) 20.0 - 24.0 mEq/L   TCO2 15.4 0 - 100 mmol/L   Acid-base deficit 8.4 (H) 0.0 - 2.0 mmol/L   O2 Saturation 96.3 %   Patient temperature 37.0    Collection site RIGHT RADIAL    Drawn by [place]:[id number]    Sample type ARTERIAL DRAW    Allens test (pass/fail) PASS PASS  Glucose, capillary     Status: Abnormal   Collection Time: 09/28/14  9:50 AM  Result Value Ref Range   Glucose-Capillary 149 (H) 70 - 99 mg/dL  Glucose, capillary     Status: Abnormal   Collection Time: 09/28/14 10:47 AM  Result Value Ref Range   Glucose-Capillary 213 (H) 70 - 99 mg/dL  Glucose, capillary     Status: Abnormal   Collection Time: 09/28/14 11:44 AM  Result Value Ref Range  Glucose-Capillary 193 (H) 70 - 99 mg/dL   Comment 1 Notify RN   Glucose, capillary     Status: Abnormal   Collection Time: 09/28/14 12:45 PM  Result Value Ref Range   Glucose-Capillary 226 (H) 70 - 99 mg/dL  Basic metabolic panel (stat then every 4 hours)     Status: Abnormal   Collection Time: 09/28/14  1:27 PM  Result Value Ref Range   Sodium 141 135 - 145 mmol/L   Potassium 3.4 (L) 3.5 - 5.1 mmol/L   Chloride 116 (H) 96 - 112 mmol/L   CO2 17 (L) 19 - 32 mmol/L   Glucose, Bld 170 (H) 70 - 99 mg/dL   BUN 24 (H) 6 - 23 mg/dL   Creatinine, Ser 0.76 0.50 - 1.10 mg/dL   Calcium 8.3 (L) 8.4 - 10.5 mg/dL   GFR calc non Af Amer 84 (L) >90 mL/min   GFR calc Af Amer >90 >90 mL/min   Anion gap 8 5 - 15  Glucose, capillary     Status: Abnormal   Collection Time: 09/28/14  1:48 PM  Result Value Ref Range   Glucose-Capillary 139 (H) 70 - 99 mg/dL  Glucose, capillary     Status: Abnormal   Collection Time: 09/28/14  2:55 PM  Result Value Ref Range   Glucose-Capillary 105 (H) 70 - 99 mg/dL  Glucose, capillary     Status: Abnormal   Collection Time: 09/28/14  3:47 PM  Result  Value Ref Range   Glucose-Capillary 118 (H) 70 - 99 mg/dL  Glucose, capillary     Status: Abnormal   Collection Time: 09/28/14  4:53 PM  Result Value Ref Range   Glucose-Capillary 128 (H) 70 - 99 mg/dL  Basic metabolic panel (stat then every 4 hours)     Status: Abnormal   Collection Time: 09/28/14  4:59 PM  Result Value Ref Range   Sodium 138 135 - 145 mmol/L   Potassium 3.7 3.5 - 5.1 mmol/L   Chloride 113 (H) 96 - 112 mmol/L   CO2 17 (L) 19 - 32 mmol/L   Glucose, Bld 171 (H) 70 - 99 mg/dL   BUN 22 6 - 23 mg/dL   Creatinine, Ser 0.80 0.50 - 1.10 mg/dL   Calcium 8.3 (L) 8.4 - 10.5 mg/dL   GFR calc non Af Amer 74 (L) >90 mL/min   GFR calc Af Amer 85 (L) >90 mL/min   Anion gap 8 5 - 15  Glucose, capillary     Status: Abnormal   Collection Time: 09/28/14  6:10 PM  Result Value Ref Range   Glucose-Capillary 202 (H) 70 - 99 mg/dL  Glucose, capillary     Status: Abnormal   Collection Time: 09/28/14  8:11 PM  Result Value Ref Range   Glucose-Capillary 202 (H) 70 - 99 mg/dL  Culture, expectorated sputum-assessment     Status: None   Collection Time: 09/28/14  8:50 PM  Result Value Ref Range   Specimen Description SPUTUM    Special Requests NONE    Sputum evaluation      THIS SPECIMEN IS ACCEPTABLE. RESPIRATORY CULTURE REPORT TO FOLLOW.   Report Status 09/29/2014 FINAL   Clostridium Difficile by PCR     Status: None   Collection Time: 09/28/14  9:30 PM  Result Value Ref Range   C difficile by pcr NEGATIVE NEGATIVE  Glucose, capillary     Status: Abnormal   Collection Time: 09/28/14 11:53 PM  Result Value Ref Range  Glucose-Capillary 201 (H) 70 - 99 mg/dL   Comment 1 Notify RN   Glucose, capillary     Status: Abnormal   Collection Time: 09/29/14  3:23 AM  Result Value Ref Range   Glucose-Capillary 154 (H) 70 - 99 mg/dL  Procalcitonin     Status: None   Collection Time: 09/29/14  4:24 AM  Result Value Ref Range   Procalcitonin 0.15 ng/mL  Basic metabolic panel     Status:  Abnormal   Collection Time: 09/29/14  4:24 AM  Result Value Ref Range   Sodium 135 135 - 145 mmol/L   Potassium 3.5 3.5 - 5.1 mmol/L   Chloride 110 96 - 112 mmol/L   CO2 18 (L) 19 - 32 mmol/L   Glucose, Bld 180 (H) 70 - 99 mg/dL   BUN 18 6 - 23 mg/dL   Creatinine, Ser 0.66 0.50 - 1.10 mg/dL   Calcium 8.3 (L) 8.4 - 10.5 mg/dL   GFR calc non Af Amer 88 (L) >90 mL/min   GFR calc Af Amer >90 >90 mL/min   Anion gap 7 5 - 15     ABGS  Recent Labs  09/28/14 0915  PHART 7.315*  PO2ART 84.2  TCO2 15.4  HCO3 16.6*   CULTURES Recent Results (from the past 240 hour(s))  MRSA PCR Screening     Status: None   Collection Time: 09/27/14  9:05 PM  Result Value Ref Range Status   MRSA by PCR NEGATIVE NEGATIVE Final    Comment:        The GeneXpert MRSA Assay (FDA approved for NASAL specimens only), is one component of a comprehensive MRSA colonization surveillance program. It is not intended to diagnose MRSA infection nor to guide or monitor treatment for MRSA infections.   Culture, expectorated sputum-assessment     Status: None   Collection Time: 09/28/14  8:50 PM  Result Value Ref Range Status   Specimen Description SPUTUM  Final   Special Requests NONE  Final   Sputum evaluation   Final    THIS SPECIMEN IS ACCEPTABLE. RESPIRATORY CULTURE REPORT TO FOLLOW.   Report Status 09/29/2014 FINAL  Final  Clostridium Difficile by PCR     Status: None   Collection Time: 09/28/14  9:30 PM  Result Value Ref Range Status   C difficile by pcr NEGATIVE NEGATIVE Final   Studies/Results: Dg Chest Portable 1 View  09/27/2014   CLINICAL DATA:  Vomiting for 2 days.  Weakness.  EXAM: PORTABLE CHEST - 1 VIEW  COMPARISON:  PA and lateral chest 04/13/2011  FINDINGS: The lungs are clear. Heart size is normal. No pneumothorax or pleural effusion  IMPRESSION: No acute disease.   Electronically Signed   By: Inge Rise M.D.   On: 09/27/2014 19:15   Micro Results: Recent Results (from the past  240 hour(s))  MRSA PCR Screening     Status: None   Collection Time: 09/27/14  9:05 PM  Result Value Ref Range Status   MRSA by PCR NEGATIVE NEGATIVE Final    Comment:        The GeneXpert MRSA Assay (FDA approved for NASAL specimens only), is one component of a comprehensive MRSA colonization surveillance program. It is not intended to diagnose MRSA infection nor to guide or monitor treatment for MRSA infections.   Culture, expectorated sputum-assessment     Status: None   Collection Time: 09/28/14  8:50 PM  Result Value Ref Range Status   Specimen Description SPUTUM  Final   Special Requests NONE  Final   Sputum evaluation   Final    THIS SPECIMEN IS ACCEPTABLE. RESPIRATORY CULTURE REPORT TO FOLLOW.   Report Status 09/29/2014 FINAL  Final  Clostridium Difficile by PCR     Status: None   Collection Time: 09/28/14  9:30 PM  Result Value Ref Range Status   C difficile by pcr NEGATIVE NEGATIVE Final   Studies/Results: Dg Chest Portable 1 View  09/27/2014   CLINICAL DATA:  Vomiting for 2 days.  Weakness.  EXAM: PORTABLE CHEST - 1 VIEW  COMPARISON:  PA and lateral chest 04/13/2011  FINDINGS: The lungs are clear. Heart size is normal. No pneumothorax or pleural effusion  IMPRESSION: No acute disease.   Electronically Signed   By: Inge Rise M.D.   On: 09/27/2014 19:15   Medications:  I have reviewed the patient's current medications Scheduled Meds: . antiseptic oral rinse  7 mL Mouth Rinse BID  . cefTRIAXone (ROCEPHIN)  IV  1 g Intravenous Q24H  . enoxaparin (LOVENOX) injection  40 mg Subcutaneous Q24H  . insulin aspart  0-24 Units Subcutaneous 6 times per day  . insulin glargine  30 Units Subcutaneous Daily  . levothyroxine  125 mcg Oral QAC breakfast  . lisinopril  40 mg Oral Daily  . pantoprazole  40 mg Oral Daily  . PARoxetine  30 mg Oral Daily  . potassium chloride  20 mEq Oral TID   Continuous Infusions: . sodium chloride     PRN Meds:.acetaminophen,  guaiFENesin-dextromethorphan, hydrALAZINE, ondansetron (ZOFRAN) IV   Assessment/Plan: #1. Diabetic ketoacidosis. Improved. Modify fluids to normal saline and discontinue insulin drip. Start Lantus 30 units daily and continue sliding scale NovoLog. Advance diet. Bicarbonate is up to 18. Electrolytes are normal. Renal function has returned to normal. #2. Cough and low-grade fever. Initial chest x-ray revealed no infiltrate. Sputum culture pending. IV ceftriaxone started last night. Repeat chest x-ray. #3. Hypertension. Restart ACE inhibitor therapy. #4. Urinary retention. Will remove catheter when she is able to get up and down on her own. Principal Problem:   DKA (diabetic ketoacidoses) Active Problems:   Nausea and vomiting   Diarrhea   Hyperkalemia   AKI (acute kidney injury)   Diabetes mellitus     LOS: 2 days   Arilyn Brierley 09/29/2014, 7:40 AM

## 2014-09-30 LAB — GLUCOSE, CAPILLARY
GLUCOSE-CAPILLARY: 151 mg/dL — AB (ref 70–99)
GLUCOSE-CAPILLARY: 138 mg/dL — AB (ref 70–99)
Glucose-Capillary: 301 mg/dL — ABNORMAL HIGH (ref 70–99)
Glucose-Capillary: 325 mg/dL — ABNORMAL HIGH (ref 70–99)

## 2014-09-30 LAB — BASIC METABOLIC PANEL
Anion gap: 7 (ref 5–15)
BUN: 13 mg/dL (ref 6–23)
CALCIUM: 8 mg/dL — AB (ref 8.4–10.5)
CO2: 21 mmol/L (ref 19–32)
CREATININE: 0.67 mg/dL (ref 0.50–1.10)
Chloride: 106 mmol/L (ref 96–112)
GFR calc Af Amer: >90 mL/min (ref >90)
GFR calc non Af Amer: 88 mL/min — ABNORMAL LOW (ref >90)
GLUCOSE: 336 mg/dL — AB (ref 70–99)
Potassium: 3.6 mmol/L (ref 3.5–5.1)
SODIUM: 134 mmol/L — AB (ref 135–145)

## 2014-09-30 LAB — URINE CULTURE: Colony Count: 30000

## 2014-09-30 MED ORDER — INSULIN GLARGINE 100 UNIT/ML ~~LOC~~ SOLN
10.0000 [IU] | Freq: Once | SUBCUTANEOUS | Status: AC
  Administered 2014-09-30: 10 [IU] via SUBCUTANEOUS
  Filled 2014-09-30: qty 0.1

## 2014-09-30 MED ORDER — INSULIN ASPART 100 UNIT/ML ~~LOC~~ SOLN
15.0000 [IU] | Freq: Three times a day (TID) | SUBCUTANEOUS | Status: DC
  Administered 2014-09-30 – 2014-10-01 (×4): 15 [IU] via SUBCUTANEOUS

## 2014-09-30 MED ORDER — INSULIN GLARGINE 100 UNIT/ML ~~LOC~~ SOLN
40.0000 [IU] | Freq: Two times a day (BID) | SUBCUTANEOUS | Status: DC
  Administered 2014-10-01: 40 [IU] via SUBCUTANEOUS
  Filled 2014-09-30 (×5): qty 0.4

## 2014-09-30 MED ORDER — INSULIN GLARGINE 100 UNIT/ML ~~LOC~~ SOLN: 40.0000 [IU] | Freq: Every day | SUBCUTANEOUS | Status: DC

## 2014-09-30 MED ORDER — INSULIN ASPART 100 UNIT/ML ~~LOC~~ SOLN
0.0000 [IU] | SUBCUTANEOUS | Status: DC
  Administered 2014-09-30: 4 [IU] via SUBCUTANEOUS
  Administered 2014-09-30: 3 [IU] via SUBCUTANEOUS
  Administered 2014-10-01: 11 [IU] via SUBCUTANEOUS
  Administered 2014-10-01 (×2): 3 [IU] via SUBCUTANEOUS

## 2014-09-30 NOTE — Progress Notes (Signed)
Subjective: This is a patient of Dr. Ria Comment who was admitted with diabetic ketoacidosis. She also sees Dr. Forde Dandy endocrinologist in [place]. She is frustrated today because she says at home she takes U500 insulin and it generally keeps her blood sugar better controlled and she's not getting that here.  Objective: Vital signs in last 24 hours: Temp:  [97.8 F (36.6 C)-98.4 F (36.9 C)] 98 F (36.7 C) (04/20 0557) Pulse Rate:  [82-96] 86 (04/20 0557) Resp:  [20-27] 20 (04/20 0557) BP: (109-186)/(48-80) 147/58 mmHg (04/20 0557) SpO2:  [99 %-100 %] 99 % (04/20 0557) Weight:  [100 kg (220 lb 7.4 oz)] 100 kg (220 lb 7.4 oz) (04/20 0557) Weight change: 3.1 kg (6 lb 13.4 oz) Last BM Date: 09/29/14  Intake/Output from previous day: 04/19 0701 - 04/20 0700 In: 480 [P.O.:480] Out: 601 [Urine:600; Stool:1]  PHYSICAL EXAM General appearance: alert and mild distress Resp: clear to auscultation bilaterally Cardio: regular rate and rhythm, S1, S2 normal, no murmur, click, rub or gallop GI: soft, non-tender; bowel sounds normal; no masses,  no organomegaly Extremities: extremities normal, atraumatic, no cyanosis or edema  Lab Results:  Results for orders placed or performed during the hospital encounter of 09/27/14 (from the past 48 hour(s))  Basic metabolic panel (stat then every 4 hours)     Status: Abnormal   Collection Time: 09/28/14  8:53 AM  Result Value Ref Range   Sodium 142 135 - 145 mmol/L   Potassium 4.0 3.5 - 5.1 mmol/L   Chloride 118 (H) 96 - 112 mmol/L   CO2 16 (L) 19 - 32 mmol/L   Glucose, Bld 178 (H) 70 - 99 mg/dL   BUN 26 (H) 6 - 23 mg/dL   Creatinine, Ser 0.85 0.50 - 1.10 mg/dL   Calcium 8.3 (L) 8.4 - 10.5 mg/dL   GFR calc non Af Amer 68 (L) >90 mL/min   GFR calc Af Amer 79 (L) >90 mL/min    Comment: (NOTE) The eGFR has been calculated using the CKD EPI equation. This calculation has not been validated in all clinical situations. eGFR's persistently <90 mL/min  signify possible Chronic Kidney Disease.    Anion gap 8 5 - 15  Blood gas, arterial     Status: Abnormal   Collection Time: 09/28/14  9:15 AM  Result Value Ref Range   FIO2 0.21 %   pH, Arterial 7.315 (L) 7.350 - 7.450   pCO2 arterial 33.5 (L) 35.0 - 45.0 mmHg   pO2, Arterial 84.2 80.0 - 100.0 mmHg   Bicarbonate 16.6 (L) 20.0 - 24.0 mEq/L   TCO2 15.4 0 - 100 mmol/L   Acid-base deficit 8.4 (H) 0.0 - 2.0 mmol/L   O2 Saturation 96.3 %   Patient temperature 37.0    Collection site RIGHT RADIAL    Drawn by [phone]    Sample type ARTERIAL DRAW    Allens test (pass/fail) PASS PASS  Glucose, capillary     Status: Abnormal   Collection Time: 09/28/14  9:50 AM  Result Value Ref Range   Glucose-Capillary 149 (H) 70 - 99 mg/dL  Glucose, capillary     Status: Abnormal   Collection Time: 09/28/14 10:47 AM  Result Value Ref Range   Glucose-Capillary 213 (H) 70 - 99 mg/dL  Glucose, capillary     Status: Abnormal   Collection Time: 09/28/14 11:44 AM  Result Value Ref Range   Glucose-Capillary 193 (H) 70 - 99 mg/dL   Comment 1 Notify RN   Glucose,  capillary     Status: Abnormal   Collection Time: 09/28/14 12:45 PM  Result Value Ref Range   Glucose-Capillary 226 (H) 70 - 99 mg/dL  Basic metabolic panel (stat then every 4 hours)     Status: Abnormal   Collection Time: 09/28/14  1:27 PM  Result Value Ref Range   Sodium 141 135 - 145 mmol/L   Potassium 3.4 (L) 3.5 - 5.1 mmol/L   Chloride 116 (H) 96 - 112 mmol/L   CO2 17 (L) 19 - 32 mmol/L   Glucose, Bld 170 (H) 70 - 99 mg/dL   BUN 24 (H) 6 - 23 mg/dL   Creatinine, Ser 0.76 0.50 - 1.10 mg/dL   Calcium 8.3 (L) 8.4 - 10.5 mg/dL   GFR calc non Af Amer 84 (L) >90 mL/min   GFR calc Af Amer >90 >90 mL/min    Comment: (NOTE) The eGFR has been calculated using the CKD EPI equation. This calculation has not been validated in all clinical situations. eGFR's persistently <90 mL/min signify possible Chronic Kidney Disease.    Anion gap 8 5 - 15   Glucose, capillary     Status: Abnormal   Collection Time: 09/28/14  1:48 PM  Result Value Ref Range   Glucose-Capillary 139 (H) 70 - 99 mg/dL  Glucose, capillary     Status: Abnormal   Collection Time: 09/28/14  2:55 PM  Result Value Ref Range   Glucose-Capillary 105 (H) 70 - 99 mg/dL  Glucose, capillary     Status: Abnormal   Collection Time: 09/28/14  3:47 PM  Result Value Ref Range   Glucose-Capillary 118 (H) 70 - 99 mg/dL  Glucose, capillary     Status: Abnormal   Collection Time: 09/28/14  4:53 PM  Result Value Ref Range   Glucose-Capillary 128 (H) 70 - 99 mg/dL  Basic metabolic panel (stat then every 4 hours)     Status: Abnormal   Collection Time: 09/28/14  4:59 PM  Result Value Ref Range   Sodium 138 135 - 145 mmol/L   Potassium 3.7 3.5 - 5.1 mmol/L   Chloride 113 (H) 96 - 112 mmol/L   CO2 17 (L) 19 - 32 mmol/L   Glucose, Bld 171 (H) 70 - 99 mg/dL   BUN 22 6 - 23 mg/dL   Creatinine, Ser 0.80 0.50 - 1.10 mg/dL   Calcium 8.3 (L) 8.4 - 10.5 mg/dL   GFR calc non Af Amer 74 (L) >90 mL/min   GFR calc Af Amer 85 (L) >90 mL/min    Comment: (NOTE) The eGFR has been calculated using the CKD EPI equation. This calculation has not been validated in all clinical situations. eGFR's persistently <90 mL/min signify possible Chronic Kidney Disease.    Anion gap 8 5 - 15  Glucose, capillary     Status: Abnormal   Collection Time: 09/28/14  6:10 PM  Result Value Ref Range   Glucose-Capillary 202 (H) 70 - 99 mg/dL  Glucose, capillary     Status: Abnormal   Collection Time: 09/28/14  8:11 PM  Result Value Ref Range   Glucose-Capillary 202 (H) 70 - 99 mg/dL  Culture, expectorated sputum-assessment     Status: None   Collection Time: 09/28/14  8:50 PM  Result Value Ref Range   Specimen Description SPUTUM    Special Requests NONE    Sputum evaluation      THIS SPECIMEN IS ACCEPTABLE. RESPIRATORY CULTURE REPORT TO FOLLOW.   Report Status 09/29/2014 FINAL  Culture, respiratory  (NON-Expectorated)     Status: None (Preliminary result)   Collection Time: 09/28/14  8:50 PM  Result Value Ref Range   Specimen Description SPUTUM    Special Requests NONE    Gram Stain      ABUNDANT WBC PRESENT, PREDOMINANTLY PMN RARE SQUAMOUS EPITHELIAL CELLS PRESENT MODERATE GRAM NEGATIVE COCCI FEW GRAM POSITIVE COCCI IN PAIRS    Culture      NORMAL OROPHARYNGEAL FLORA Performed at [facility]    Report Status PENDING   Clostridium Difficile by PCR     Status: None   Collection Time: 09/28/14  9:30 PM  Result Value Ref Range   C difficile by pcr NEGATIVE NEGATIVE  Glucose, capillary     Status: Abnormal   Collection Time: 09/28/14 11:53 PM  Result Value Ref Range   Glucose-Capillary 201 (H) 70 - 99 mg/dL   Comment 1 Notify RN   Glucose, capillary     Status: Abnormal   Collection Time: 09/29/14  3:23 AM  Result Value Ref Range   Glucose-Capillary 154 (H) 70 - 99 mg/dL  Procalcitonin     Status: None   Collection Time: 09/29/14  4:24 AM  Result Value Ref Range   Procalcitonin 0.15 ng/mL    Comment:        Interpretation: PCT (Procalcitonin) <= 0.5 ng/mL: Systemic infection (sepsis) is not likely. Local bacterial infection is possible. (NOTE)         ICU PCT Algorithm               Non ICU PCT Algorithm    ----------------------------     ------------------------------         PCT < 0.25 ng/mL                 PCT < 0.1 ng/mL     Stopping of antibiotics            Stopping of antibiotics       strongly encouraged.               strongly encouraged.    ----------------------------     ------------------------------       PCT level decrease by               PCT < 0.25 ng/mL       >= 80% from peak PCT       OR PCT 0.25 - 0.5 ng/mL          Stopping of antibiotics                                             encouraged.     Stopping of antibiotics           encouraged.    ----------------------------     ------------------------------       PCT level decrease  by              PCT >= 0.25 ng/mL       < 80% from peak PCT        AND PCT >= 0.5 ng/mL            Continuin g antibiotics  encouraged.       Continuing antibiotics            encouraged.    ----------------------------     ------------------------------     PCT level increase compared          PCT > 0.5 ng/mL         with peak PCT AND          PCT >= 0.5 ng/mL             Escalation of antibiotics                                          strongly encouraged.      Escalation of antibiotics        strongly encouraged.   Basic metabolic panel     Status: Abnormal   Collection Time: 09/29/14  4:24 AM  Result Value Ref Range   Sodium 135 135 - 145 mmol/L   Potassium 3.5 3.5 - 5.1 mmol/L   Chloride 110 96 - 112 mmol/L   CO2 18 (L) 19 - 32 mmol/L   Glucose, Bld 180 (H) 70 - 99 mg/dL   BUN 18 6 - 23 mg/dL   Creatinine, Ser 0.66 0.50 - 1.10 mg/dL   Calcium 8.3 (L) 8.4 - 10.5 mg/dL   GFR calc non Af Amer 88 (L) >90 mL/min   GFR calc Af Amer >90 >90 mL/min    Comment: (NOTE) The eGFR has been calculated using the CKD EPI equation. This calculation has not been validated in all clinical situations. eGFR's persistently <90 mL/min signify possible Chronic Kidney Disease.    Anion gap 7 5 - 15  Glucose, capillary     Status: Abnormal   Collection Time: 09/29/14  7:38 AM  Result Value Ref Range   Glucose-Capillary 191 (H) 70 - 99 mg/dL   Comment 1 Notify RN    Comment 2 Document in Chart   Glucose, capillary     Status: Abnormal   Collection Time: 09/29/14 11:13 AM  Result Value Ref Range   Glucose-Capillary 367 (H) 70 - 99 mg/dL   Comment 1 Notify RN    Comment 2 Document in Chart   Glucose, capillary     Status: Abnormal   Collection Time: 09/29/14  5:09 PM  Result Value Ref Range   Glucose-Capillary 314 (H) 70 - 99 mg/dL  Glucose, capillary     Status: Abnormal   Collection Time: 09/29/14  9:52 PM  Result Value Ref Range    Glucose-Capillary 331 (H) 70 - 99 mg/dL   Comment 1 Notify RN    Comment 2 Document in Chart   Basic metabolic panel     Status: Abnormal   Collection Time: 09/30/14  6:10 AM  Result Value Ref Range   Sodium 134 (L) 135 - 145 mmol/L   Potassium 3.6 3.5 - 5.1 mmol/L   Chloride 106 96 - 112 mmol/L   CO2 21 19 - 32 mmol/L   Glucose, Bld 336 (H) 70 - 99 mg/dL   BUN 13 6 - 23 mg/dL   Creatinine, Ser 0.67 0.50 - 1.10 mg/dL   Calcium 8.0 (L) 8.4 - 10.5 mg/dL   GFR calc non Af Amer 88 (L) >90 mL/min   GFR calc Af Amer >90 >90 mL/min    Comment: (NOTE) The eGFR has been calculated using the  CKD EPI equation. This calculation has not been validated in all clinical situations. eGFR's persistently <90 mL/min signify possible Chronic Kidney Disease.    Anion gap 7 5 - 15  Glucose, capillary     Status: Abnormal   Collection Time: 09/30/14  7:58 AM  Result Value Ref Range   Glucose-Capillary 301 (H) 70 - 99 mg/dL   Comment 1 Notify RN    Comment 2 Document in Chart     ABGS  Recent Labs  09/28/14 0915  PHART 7.315*  PO2ART 84.2  TCO2 15.4  HCO3 16.6*   CULTURES Recent Results (from the past 240 hour(s))  MRSA PCR Screening     Status: None   Collection Time: 09/27/14  9:05 PM  Result Value Ref Range Status   MRSA by PCR NEGATIVE NEGATIVE Final    Comment:        The GeneXpert MRSA Assay (FDA approved for NASAL specimens only), is one component of a comprehensive MRSA colonization surveillance program. It is not intended to diagnose MRSA infection nor to guide or monitor treatment for MRSA infections.   Culture, expectorated sputum-assessment     Status: None   Collection Time: 09/28/14  8:50 PM  Result Value Ref Range Status   Specimen Description SPUTUM  Final   Special Requests NONE  Final   Sputum evaluation   Final    THIS SPECIMEN IS ACCEPTABLE. RESPIRATORY CULTURE REPORT TO FOLLOW.   Report Status 09/29/2014 FINAL  Final  Culture, respiratory  (NON-Expectorated)     Status: None (Preliminary result)   Collection Time: 09/28/14  8:50 PM  Result Value Ref Range Status   Specimen Description SPUTUM  Final   Special Requests NONE  Final   Gram Stain   Final    ABUNDANT WBC PRESENT, PREDOMINANTLY PMN RARE SQUAMOUS EPITHELIAL CELLS PRESENT MODERATE GRAM NEGATIVE COCCI FEW GRAM POSITIVE COCCI IN PAIRS    Culture   Final    NORMAL OROPHARYNGEAL FLORA Performed at [facility]    Report Status PENDING  Incomplete  Clostridium Difficile by PCR     Status: None   Collection Time: 09/28/14  9:30 PM  Result Value Ref Range Status   C difficile by pcr NEGATIVE NEGATIVE Final   Studies/Results: Dg Chest 1 View  09/29/2014   CLINICAL DATA:  Hemoptysis. Productive cough for the past week. Mid chest pain. Shortness of breath.  EXAM: CHEST  1 VIEW  COMPARISON:  09/27/2014.  FINDINGS: Stable enlarged cardiac silhouette and prominent pulmonary vasculature. Clear lungs. Old, healed right rib fractures.  IMPRESSION: No acute abnormality. Stable cardiomegaly and pulmonary vascular congestion.   Electronically Signed   By: Claudie Revering M.D.   On: 09/29/2014 10:43    Medications:  Prior to Admission:  Prescriptions prior to admission  Medication Sig Dispense Refill Last Dose  . acetaminophen (TYLENOL) 500 MG tablet Take 1,000 mg by mouth every 6 (six) hours as needed for headache.   unknown  . acetaminophen-codeine (TYLENOL #3) 300-30 MG per tablet Take 1 tablet by mouth every 4 (four) hours as needed. For pain   unknown  . carisoprodol (SOMA) 350 MG tablet Take 175-350 mg by mouth daily as needed for muscle spasms. For cramps   Past Week at Unknown time  . diphenhydramine-acetaminophen (TYLENOL PM) 25-500 MG TABS Take 0.5-1 tablets by mouth at bedtime as needed (sleep).   unknown  . esomeprazole (NEXIUM) 40 MG capsule Take 40 mg by mouth daily before breakfast.  Past Week at Unknown time  . furosemide (LASIX) 20 MG tablet Take 20 mg  by mouth daily as needed for fluid.    Past Week at Unknown time  . insulin regular human CONCENTRATED (HUMULIN R) 500 UNIT/ML SOLN injection Inject 10-24 Units into the skin 3 (three) times daily with meals. Per sliding scale.   Past Week at Unknown time  . levothyroxine (SYNTHROID, LEVOTHROID) 125 MCG tablet Take 125 mcg by mouth daily.     Past Week at Unknown time  . PARoxetine (PAXIL) 30 MG tablet Take 30 mg by mouth daily.   Past Week at Unknown time  . Polyethylene Glycol 400 (BLINK TEARS OP) Place 2 drops into both eyes 3 (three) times daily.   Past Week at Unknown time  . quinapril (ACCUPRIL) 40 MG tablet Take 1 tablet (40 mg total) by mouth daily. 30 tablet 6 Past Week at Unknown time  . Senna-Psyllium (PERDIEM PO) Take 1 tablet by mouth daily.   Past Week at Unknown time  . simvastatin (ZOCOR) 20 MG tablet Take 20 mg by mouth at bedtime.     Past Week at Unknown time   Scheduled: . antiseptic oral rinse  7 mL Mouth Rinse BID  . cefTRIAXone (ROCEPHIN)  IV  1 g Intravenous Q24H  . enoxaparin (LOVENOX) injection  40 mg Subcutaneous Q24H  . insulin aspart  0-20 Units Subcutaneous TID WC  . insulin glargine  30 Units Subcutaneous Daily  . levothyroxine  125 mcg Oral QAC breakfast  . lisinopril  40 mg Oral Daily  . pantoprazole  40 mg Oral Daily  . PARoxetine  30 mg Oral Daily  . potassium chloride  20 mEq Oral TID   Continuous: . sodium chloride 50 mL/hr at 09/29/14 1711   QRF:[id number], guaiFENesin-dextromethorphan, hydrALAZINE, ondansetron (ZOFRAN) IV  Assesment: She has diabetic ketoacidosis. She is better and her acidemia seems to have resolved. Her blood sugars still in the 300 range however. She's concerned about the type of insulin she is getting. Principal Problem:   DKA (diabetic ketoacidoses) Active Problems:   Nausea and vomiting   Diarrhea   Hyperkalemia   AKI (acute kidney injury)   Diabetes mellitus    Plan: I have discussed with pharmacy regarding  getting U500 insulin. Alternatives would be for her to either provide [place] with her insulin or it can be ordered but will not be here until tomorrow. I have paged diabetic coordinator to discuss sliding scale but have not heard back yet    LOS: 3 days   Jaclyn Herrera L 09/30/2014, 8:50 AM

## 2014-09-30 NOTE — Progress Notes (Addendum)
Inpatient Diabetes Program Recommendations  AACE/ADA: New Consensus Statement on Inpatient Glycemic Control (2013)  Target Ranges:  Prepandial:   less than 140 mg/dL      Peak postprandial:   less than 180 mg/dL (1-2 hours)      Critically ill patients:  140 - 180 mg/dL   Results for Jaclyn Herrera, Jaclyn Herrera (MRN [id number]) as of 09/30/2014 08:07  Ref. Range 09/29/2014 07:38 09/29/2014 11:13 09/29/2014 17:09 09/29/2014 21:52 09/30/2014 07:58  Glucose-Capillary Latest Ref Range: 70-99 mg/dL 191 (H) 367 (H) 314 (H) 331 (H) 301 (H)   Diabetes history: DM2 Outpatient Diabetes medications: Humulin R U500 10-24 units TID with meals Current orders for Inpatient glycemic control: Lantus 30 units daily, Novolog 0-20 units TID with meals  Inpatient Diabetes Program Recommendations Insulin - Basal: Patient received Lantus 30 units at 9:39 am and Lantus 20 units at 22:32 (for a total of 50 units on 09/29/14). Fasting glucose is 301 mg/dl this morning. Please consider increasing Lantus to 40 units BID. Correction (SSI): Please add Novolog bedtime correction scale. Insulin - Meal Coverage: Please consider ordering Novolog 15 units TID with meals for meal coverage (in addition to Novolog correction) if patient eats at least 50% of meals.  Thanks, Barnie Alderman, RN, MSN, CCRN, CDE Diabetes Coordinator Inpatient Diabetes Program [phone] (Team Pager from [place] to [place]) [phone] ([facility] office) [phone] [facility] office)

## 2014-10-01 LAB — GLUCOSE, CAPILLARY
Glucose-Capillary: 262 mg/dL — ABNORMAL HIGH (ref 70–99)
Glucose-Capillary: 88 mg/dL (ref 70–99)

## 2014-10-01 LAB — CULTURE, RESPIRATORY

## 2014-10-01 LAB — CULTURE, RESPIRATORY W GRAM STAIN: Culture: NORMAL

## 2014-10-01 LAB — PROCALCITONIN: PROCALCITONIN: 0.12 ng/mL

## 2014-10-01 MED ORDER — CEFUROXIME AXETIL 500 MG PO TABS: 500.0000 mg | ORAL_TABLET | Freq: Two times a day (BID) | ORAL | Status: DC

## 2014-10-01 NOTE — Discharge Summary (Signed)
Physician Discharge Summary  Jaclyn Herrera:[id number] DOB: 09/05/1945 DOA: 09/27/2014   Admit date: 09/27/2014 Discharge date: 10/01/2014  Discharge Diagnoses:  Principal Problem:   DKA (diabetic ketoacidoses) Active Problems:   Nausea and vomiting   Diarrhea   Hyperkalemia   AKI (acute kidney injury)   Diabetes mellitus    Wt Readings from Last 3 Encounters:  09/30/14 220 lb 7.4 oz (100 kg)  07/14/13 224 lb (101.606 kg)  05/30/13 226 lb (102.513 kg)     Hospital Course:  This patient is a 69 year old female who presented with nausea, vomiting, diarrhea and generalized weakness. She was found to be in diabetic ketoacidosis with a pH of 6.97. Glucose was over 600. Bicarbonate was low. Lactic acid was elevated. She was treated with IV fluids and an intravenous insulin protocol. She was monitored closely in the step down unit. She was initially given bicarbonate also. Her pH gradually improved. Her glucose improved. Her nausea and vomiting resolved. She was transferred to a regular room. Diet was advanced. She tolerated this well. Her insulin was not available therefore she was treated with Lantus and NovoLog. Her metabolic abnormalities resolved. Bicarbonate level returned to normal. She is much improved and stable for discharge on the morning of April 21. She will be seen in follow-up in my office in one week. She will restart her previous insulin regimen as directed by Dr. Evlyn Kanner.  She expressed low-grade fever to 100.8 with an associated cough. Chest x-ray revealed no infiltrate. She was initially treated with ceftriaxone. She'll be discharged on cefuroxime for 5 more days. Lungs are clear on exam. Heart rate is normal and abdomen is soft and nontender.   Discharge Instructions     Medication List    STOP taking these medications        acetaminophen-codeine 300-30 MG per tablet  Commonly known as:  TYLENOL #3      TAKE these medications        acetaminophen 500 MG  tablet  Commonly known as:  TYLENOL  Take 1,000 mg by mouth every 6 (six) hours as needed for headache.     BLINK TEARS OP  Place 2 drops into both eyes 3 (three) times daily.     carisoprodol 350 MG tablet  Commonly known as:  SOMA  Take 175-350 mg by mouth daily as needed for muscle spasms. For cramps     cefUROXime 500 MG tablet  Commonly known as:  CEFTIN  Take 1 tablet (500 mg total) by mouth 2 (two) times daily with a meal.     diphenhydramine-acetaminophen 25-500 MG Tabs  Commonly known as:  TYLENOL PM  Take 0.5-1 tablets by mouth at bedtime as needed (sleep).     esomeprazole 40 MG capsule  Commonly known as:  NEXIUM  Take 40 mg by mouth daily before breakfast.     furosemide 20 MG tablet  Commonly known as:  LASIX  Take 20 mg by mouth daily as needed for fluid.     HUMULIN R 500 UNIT/ML Soln injection  Generic drug:  insulin regular human CONCENTRATED  Inject 10-24 Units into the skin 3 (three) times daily with meals. Per sliding scale.     levothyroxine 125 MCG tablet  Commonly known as:  SYNTHROID, LEVOTHROID  Take 125 mcg by mouth daily.     PARoxetine 30 MG tablet  Commonly known as:  PAXIL  Take 30 mg by mouth daily.     PERDIEM PO  Take 1 tablet  by mouth daily.     quinapril 40 MG tablet  Commonly known as:  ACCUPRIL  Take 1 tablet (40 mg total) by mouth daily.     simvastatin 20 MG tablet  Commonly known as:  ZOCOR  Take 20 mg by mouth at bedtime.         Welcome Fults 10/01/2014

## 2014-10-01 NOTE — Progress Notes (Signed)
UR chart review completed.  

## 2014-10-01 NOTE — Progress Notes (Signed)
Jaclyn Herrera discharged home with nephew per MD order.  Discharge instructions reviewed and discussed with the patient and nephew at bedside, all questions and concerns answered. Copy of instructions and scripts given to patient.    Medication List    STOP taking these medications        acetaminophen-codeine 300-30 MG per tablet  Commonly known as:  TYLENOL #3      TAKE these medications        acetaminophen 500 MG tablet  Commonly known as:  TYLENOL  Take 1,000 mg by mouth every 6 (six) hours as needed for headache.     BLINK TEARS OP  Place 2 drops into both eyes 3 (three) times daily.     carisoprodol 350 MG tablet  Commonly known as:  SOMA  Take 175-350 mg by mouth daily as needed for muscle spasms. For cramps     cefUROXime 500 MG tablet  Commonly known as:  CEFTIN  Take 1 tablet (500 mg total) by mouth 2 (two) times daily with a meal.     diphenhydramine-acetaminophen 25-500 MG Tabs  Commonly known as:  TYLENOL PM  Take 0.5-1 tablets by mouth at bedtime as needed (sleep).     esomeprazole 40 MG capsule  Commonly known as:  NEXIUM  Take 40 mg by mouth daily before breakfast.     furosemide 20 MG tablet  Commonly known as:  LASIX  Take 20 mg by mouth daily as needed for fluid.     HUMULIN R 500 UNIT/ML Soln injection  Generic drug:  insulin regular human CONCENTRATED  Inject 10-24 Units into the skin 3 (three) times daily with meals. Per sliding scale.     levothyroxine 125 MCG tablet  Commonly known as:  SYNTHROID, LEVOTHROID  Take 125 mcg by mouth daily.     PARoxetine 30 MG tablet  Commonly known as:  PAXIL  Take 30 mg by mouth daily.     PERDIEM PO  Take 1 tablet by mouth daily.     quinapril 40 MG tablet  Commonly known as:  ACCUPRIL  Take 1 tablet (40 mg total) by mouth daily.     simvastatin 20 MG tablet  Commonly known as:  ZOCOR  Take 20 mg by mouth at bedtime.        Patients skin is clean, dry and intact, no evidence of skin break  down. IV site discontinued and catheter remains intact. Site without signs and symptoms of complications. Dressing and pressure applied.  Patient escorted to car by [place], NT in a wheelchair,  no distress noted upon discharge.  Regino Bellow 10/01/2014 11:45 AM

## 2014-10-01 NOTE — Progress Notes (Signed)
Patient coughing frequently.  Guaifensin 10 ml given.

## 2014-10-06 LAB — GLUCOSE, CAPILLARY
GLUCOSE-CAPILLARY: 139 mg/dL — AB (ref 70–99)
Glucose-Capillary: 142 mg/dL — ABNORMAL HIGH (ref 70–99)

## 2014-10-31 ENCOUNTER — Inpatient Hospital Stay (HOSPITAL_COMMUNITY)
Admission: EM | Admit: 2014-10-31 | Discharge: 2014-11-03 | DRG: 312 | Disposition: A | Discharge status: Home Health | Payer: Medicare Other | Attending: Internal Medicine | Admitting: Internal Medicine

## 2014-10-31 ENCOUNTER — Encounter (HOSPITAL_COMMUNITY): Payer: Self-pay | Admitting: Emergency Medicine

## 2014-10-31 ENCOUNTER — Emergency Department (HOSPITAL_COMMUNITY): Payer: Medicare Other

## 2014-10-31 DIAGNOSIS — R519 Headache, unspecified: Secondary | ICD-10-CM | POA: Diagnosis present

## 2014-10-31 DIAGNOSIS — M1990 Unspecified osteoarthritis, unspecified site: Secondary | ICD-10-CM | POA: Diagnosis present

## 2014-10-31 DIAGNOSIS — G43919 Migraine, unspecified, intractable, without status migrainosus: Secondary | ICD-10-CM | POA: Diagnosis present

## 2014-10-31 DIAGNOSIS — D696 Thrombocytopenia, unspecified: Secondary | ICD-10-CM | POA: Diagnosis present

## 2014-10-31 DIAGNOSIS — E1165 Type 2 diabetes mellitus with hyperglycemia: Secondary | ICD-10-CM | POA: Diagnosis present

## 2014-10-31 DIAGNOSIS — Z825 Family history of asthma and other chronic lower respiratory diseases: Secondary | ICD-10-CM

## 2014-10-31 DIAGNOSIS — F32A Depression, unspecified: Secondary | ICD-10-CM

## 2014-10-31 DIAGNOSIS — Z833 Family history of diabetes mellitus: Secondary | ICD-10-CM | POA: Diagnosis not present

## 2014-10-31 DIAGNOSIS — G43011 Migraine without aura, intractable, with status migrainosus: Secondary | ICD-10-CM

## 2014-10-31 DIAGNOSIS — F329 Major depressive disorder, single episode, unspecified: Secondary | ICD-10-CM | POA: Diagnosis present

## 2014-10-31 DIAGNOSIS — R55 Syncope and collapse: Principal | ICD-10-CM | POA: Diagnosis present

## 2014-10-31 DIAGNOSIS — R42 Dizziness and giddiness: Secondary | ICD-10-CM

## 2014-10-31 DIAGNOSIS — I10 Essential (primary) hypertension: Secondary | ICD-10-CM | POA: Diagnosis present

## 2014-10-31 DIAGNOSIS — Z823 Family history of stroke: Secondary | ICD-10-CM | POA: Diagnosis not present

## 2014-10-31 DIAGNOSIS — E039 Hypothyroidism, unspecified: Secondary | ICD-10-CM | POA: Diagnosis present

## 2014-10-31 DIAGNOSIS — Z8249 Family history of ischemic heart disease and other diseases of the circulatory system: Secondary | ICD-10-CM

## 2014-10-31 DIAGNOSIS — M542 Cervicalgia: Secondary | ICD-10-CM

## 2014-10-31 DIAGNOSIS — E11649 Type 2 diabetes mellitus with hypoglycemia without coma: Secondary | ICD-10-CM | POA: Diagnosis present

## 2014-10-31 DIAGNOSIS — E1159 Type 2 diabetes mellitus with other circulatory complications: Secondary | ICD-10-CM

## 2014-10-31 DIAGNOSIS — R51 Headache: Secondary | ICD-10-CM

## 2014-10-31 DIAGNOSIS — K219 Gastro-esophageal reflux disease without esophagitis: Secondary | ICD-10-CM | POA: Diagnosis present

## 2014-10-31 DIAGNOSIS — E785 Hyperlipidemia, unspecified: Secondary | ICD-10-CM | POA: Diagnosis present

## 2014-10-31 DIAGNOSIS — I152 Hypertension secondary to endocrine disorders: Secondary | ICD-10-CM

## 2014-10-31 HISTORY — DX: Dizziness and giddiness: R42

## 2014-10-31 HISTORY — DX: Hypothyroidism, unspecified: E03.9

## 2014-10-31 HISTORY — DX: Syncope and collapse: R55

## 2014-10-31 LAB — CBC WITH DIFFERENTIAL/PLATELET
Basophils Absolute: 0 10*3/uL (ref 0.0–0.1)
Basophils Relative: 0 % (ref 0–1)
EOS ABS: 0.1 10*3/uL (ref 0.0–0.7)
Eosinophils Relative: 1 % (ref 0–5)
HCT: 33.4 % — ABNORMAL LOW (ref 36.0–46.0)
HEMOGLOBIN: 10.9 g/dL — AB (ref 12.0–15.0)
Lymphocytes Relative: 12 % (ref 12–46)
Lymphs Abs: 0.4 10*3/uL — ABNORMAL LOW (ref 0.7–4.0)
MCH: 26.8 pg (ref 26.0–34.0)
MCHC: 32.6 g/dL (ref 30.0–36.0)
MCV: 82.1 fL (ref 78.0–100.0)
MONO ABS: 0.3 10*3/uL (ref 0.1–1.0)
MONOS PCT: 7 % (ref 3–12)
NEUTROS ABS: 2.9 10*3/uL (ref 1.7–7.7)
NEUTROS PCT: 80 % — AB (ref 43–77)
Platelets: 130 10*3/uL — ABNORMAL LOW (ref 150–400)
RBC: 4.07 MIL/uL (ref 3.87–5.11)
RDW: 15.5 % (ref 11.5–15.5)
WBC: 3.6 10*3/uL — ABNORMAL LOW (ref 4.0–10.5)

## 2014-10-31 LAB — BASIC METABOLIC PANEL
ANION GAP: 8 (ref 5–15)
BUN: 13 mg/dL (ref 6–20)
CO2: 23 mmol/L (ref 22–32)
Calcium: 8.4 mg/dL — ABNORMAL LOW (ref 8.9–10.3)
Chloride: 104 mmol/L (ref 101–111)
Creatinine, Ser: 0.75 mg/dL (ref 0.44–1.00)
GFR calc non Af Amer: >60 mL/min (ref >60)
Glucose, Bld: 243 mg/dL — ABNORMAL HIGH (ref 65–99)
Potassium: 3.9 mmol/L (ref 3.5–5.1)
Sodium: 135 mmol/L (ref 135–145)

## 2014-10-31 LAB — SEDIMENTATION RATE: Sed Rate: 12 mm/hr (ref 0–22)

## 2014-10-31 LAB — C-REACTIVE PROTEIN: CRP: <0.5 mg/dL (ref <1.0)

## 2014-10-31 LAB — TROPONIN I

## 2014-10-31 LAB — CBG MONITORING, ED
GLUCOSE-CAPILLARY: 200 mg/dL — AB (ref 65–99)
Glucose-Capillary: 210 mg/dL — ABNORMAL HIGH (ref 65–99)

## 2014-10-31 LAB — GLUCOSE, CAPILLARY: Glucose-Capillary: 309 mg/dL — ABNORMAL HIGH (ref 65–99)

## 2014-10-31 MED ORDER — SIMVASTATIN 20 MG PO TABS
20.0000 mg | ORAL_TABLET | Freq: Every day | ORAL | Status: DC
  Administered 2014-10-31 – 2014-11-02 (×3): 20 mg via ORAL
  Filled 2014-10-31 (×3): qty 1

## 2014-10-31 MED ORDER — CARISOPRODOL 350 MG PO TABS: 175.0000 mg | ORAL_TABLET | Freq: Every day | ORAL | Status: DC | PRN

## 2014-10-31 MED ORDER — ACETAMINOPHEN 325 MG PO TABS
650.0000 mg | ORAL_TABLET | Freq: Four times a day (QID) | ORAL | Status: DC | PRN
  Administered 2014-11-02 – 2014-11-03 (×2): 650 mg via ORAL
  Filled 2014-10-31 (×2): qty 2

## 2014-10-31 MED ORDER — ONDANSETRON HCL 4 MG/2ML IJ SOLN
4.0000 mg | Freq: Once | INTRAMUSCULAR | Status: AC
  Administered 2014-10-31: 4 mg via INTRAVENOUS
  Filled 2014-10-31: qty 2

## 2014-10-31 MED ORDER — ACETAMINOPHEN 650 MG RE SUPP: 650.0000 mg | Freq: Four times a day (QID) | RECTAL | Status: DC | PRN

## 2014-10-31 MED ORDER — DEXAMETHASONE SODIUM PHOSPHATE 10 MG/ML IJ SOLN
10.0000 mg | Freq: Once | INTRAMUSCULAR | Status: AC
  Administered 2014-10-31: 10 mg via INTRAVENOUS
  Filled 2014-10-31: qty 1

## 2014-10-31 MED ORDER — SODIUM CHLORIDE 0.9 % IV SOLN
INTRAVENOUS | Status: DC
  Administered 2014-10-31 – 2014-11-02 (×3): via INTRAVENOUS

## 2014-10-31 MED ORDER — ACETAMINOPHEN 500 MG PO TABS: 1000.0000 mg | ORAL_TABLET | Freq: Once | ORAL | Status: DC

## 2014-10-31 MED ORDER — PAROXETINE HCL 20 MG PO TABS
30.0000 mg | ORAL_TABLET | Freq: Every day | ORAL | Status: DC
  Administered 2014-11-01 – 2014-11-03 (×3): 30 mg via ORAL
  Filled 2014-10-31: qty 1
  Filled 2014-10-31 (×2): qty 2
  Filled 2014-10-31: qty 1
  Filled 2014-10-31: qty 2
  Filled 2014-10-31: qty 1

## 2014-10-31 MED ORDER — HEPARIN SODIUM (PORCINE) 5000 UNIT/ML IJ SOLN
5000.0000 [IU] | Freq: Three times a day (TID) | INTRAMUSCULAR | Status: DC
  Administered 2014-10-31 – 2014-11-03 (×8): 5000 [IU] via SUBCUTANEOUS
  Filled 2014-10-31 (×7): qty 1

## 2014-10-31 MED ORDER — HYDRALAZINE HCL 20 MG/ML IJ SOLN
5.0000 mg | INTRAMUSCULAR | Status: DC | PRN
  Administered 2014-10-31: 10 mg via INTRAVENOUS
  Administered 2014-11-01 – 2014-11-02 (×2): 5 mg via INTRAVENOUS
  Filled 2014-10-31 (×4): qty 1

## 2014-10-31 MED ORDER — DIPHENHYDRAMINE HCL 50 MG/ML IJ SOLN
25.0000 mg | Freq: Once | INTRAMUSCULAR | Status: AC
  Administered 2014-10-31: 25 mg via INTRAVENOUS
  Filled 2014-10-31: qty 1

## 2014-10-31 MED ORDER — METOCLOPRAMIDE HCL 5 MG/ML IJ SOLN
10.0000 mg | Freq: Once | INTRAMUSCULAR | Status: AC
  Administered 2014-10-31: 10 mg via INTRAVENOUS
  Filled 2014-10-31: qty 2

## 2014-10-31 MED ORDER — HYDRALAZINE HCL 20 MG/ML IJ SOLN
10.0000 mg | Freq: Once | INTRAMUSCULAR | Status: AC
  Administered 2014-10-31: 10 mg via INTRAVENOUS

## 2014-10-31 MED ORDER — ONDANSETRON HCL 4 MG PO TABS: 4.0000 mg | ORAL_TABLET | Freq: Four times a day (QID) | ORAL | Status: DC | PRN

## 2014-10-31 MED ORDER — FUROSEMIDE 20 MG PO TABS: 20.0000 mg | ORAL_TABLET | Freq: Every day | ORAL | Status: DC | PRN

## 2014-10-31 MED ORDER — ONDANSETRON HCL 4 MG/2ML IJ SOLN: 4.0000 mg | Freq: Four times a day (QID) | INTRAMUSCULAR | Status: DC | PRN

## 2014-10-31 MED ORDER — LEVOTHYROXINE SODIUM 25 MCG PO TABS
125.0000 ug | ORAL_TABLET | Freq: Every day | ORAL | Status: DC
  Administered 2014-11-01 – 2014-11-03 (×3): 125 ug via ORAL
  Filled 2014-10-31 (×6): qty 1

## 2014-10-31 MED ORDER — METOPROLOL TARTRATE 1 MG/ML IV SOLN
INTRAVENOUS | Status: AC
Start: 2014-10-31 — End: 2014-10-31
  Administered 2014-10-31: 5 mg via INTRAVENOUS
  Filled 2014-10-31: qty 5

## 2014-10-31 MED ORDER — LISINOPRIL 10 MG PO TABS
40.0000 mg | ORAL_TABLET | Freq: Every day | ORAL | Status: DC
  Administered 2014-10-31: 40 mg via ORAL
  Filled 2014-10-31 (×2): qty 4

## 2014-10-31 MED ORDER — QUINAPRIL HCL 10 MG PO TABS
40.0000 mg | ORAL_TABLET | Freq: Every day | ORAL | Status: DC
  Filled 2014-10-31 (×2): qty 4

## 2014-10-31 MED ORDER — INSULIN ASPART 100 UNIT/ML ~~LOC~~ SOLN
0.0000 [IU] | Freq: Every day | SUBCUTANEOUS | Status: DC
  Administered 2014-10-31: 4 [IU] via SUBCUTANEOUS

## 2014-10-31 MED ORDER — SODIUM CHLORIDE 0.9 % IJ SOLN
3.0000 mL | Freq: Two times a day (BID) | INTRAMUSCULAR | Status: DC
  Administered 2014-11-02 – 2014-11-03 (×2): 3 mL via INTRAVENOUS

## 2014-10-31 MED ORDER — METOPROLOL TARTRATE 1 MG/ML IV SOLN
5.0000 mg | Freq: Once | INTRAVENOUS | Status: AC
  Administered 2014-10-31: 5 mg via INTRAVENOUS

## 2014-10-31 MED ORDER — LISINOPRIL 10 MG PO TABS
ORAL_TABLET | ORAL | Status: AC
  Filled 2014-10-31: qty 3

## 2014-10-31 MED ORDER — OXYCODONE HCL 5 MG PO TABS
5.0000 mg | ORAL_TABLET | ORAL | Status: DC | PRN
  Administered 2014-10-31 (×2): 5 mg via ORAL
  Filled 2014-10-31 (×3): qty 1

## 2014-10-31 MED ORDER — INSULIN ASPART 100 UNIT/ML ~~LOC~~ SOLN
0.0000 [IU] | Freq: Three times a day (TID) | SUBCUTANEOUS | Status: DC
Start: 2014-11-01 — End: 2014-11-03
  Administered 2014-11-01: 20 [IU] via SUBCUTANEOUS
  Administered 2014-11-01: 11 [IU] via SUBCUTANEOUS
  Administered 2014-11-01: 20 [IU] via SUBCUTANEOUS
  Administered 2014-11-02: 11 [IU] via SUBCUTANEOUS
  Administered 2014-11-02: 3 [IU] via SUBCUTANEOUS
  Administered 2014-11-02: 4 [IU] via SUBCUTANEOUS
  Administered 2014-11-03: 15 [IU] via SUBCUTANEOUS
  Administered 2014-11-03: 11 [IU] via SUBCUTANEOUS

## 2014-10-31 MED ORDER — PANTOPRAZOLE SODIUM 40 MG PO TBEC
80.0000 mg | DELAYED_RELEASE_TABLET | Freq: Every day | ORAL | Status: DC
  Administered 2014-11-01 – 2014-11-02 (×2): 80 mg via ORAL
  Filled 2014-10-31 (×3): qty 2

## 2014-10-31 NOTE — ED Notes (Signed)
Dr Christy Gentles at bedside,

## 2014-10-31 NOTE — ED Notes (Signed)
Report given to [place] on 300

## 2014-10-31 NOTE — ED Notes (Signed)
Pt presents to er for further evaluation of weakness, headache that started two weeks ago, states that at first tylenol would help with the pain of the headache, then it would take two tylenol to ease the pain, grip strength equal, face is symmetrical, sensation to extremities equal, pt reports that she feels weakness from bilateral knees down, does have swelling noted to right lower leg which pt states is normal  For her .

## 2014-10-31 NOTE — ED Notes (Signed)
Pt states that her headache is better "a little" ,

## 2014-10-31 NOTE — H&P (Signed)
[facility] History and Physical  ABIMBOLA AKI TKZ:[id number] DOB: [birth year] DOA: 10/31/2014  Referring physician: Dr Christy Gentles - [facility] PCP: Asencion Noble, MD   Chief Complaint: HA  HPI: CHASELYNN KEPPLE is a 69 y.o. female  Ad[facility] to [facility] 3-4 wks ago for DKA and has had HA since that time. Single episode last night at 2300. Couple episodes are not uncommon for the patient especially when she is in pain. She reports oftentimes having orthostatic syncope. Patient Called EMS after syncopal episode in her home who evaluated pt who wanted to stay home to rest. PT had been ambulating around the house just prior to event and denies any prodrome. Poor PO for the past several days. Endorses 8lb wt loss in 13 days - intentional. Woke up this am feeling "drunk." No ETOH use. Dizziness (room spinning) since waking up this am. Worse w/ light and movement. HA is pounding over the L side of head and neck .   Symptoms are constant and getting worse.    Review of Systems:  Constitutional:  No weight loss, night sweats,, chills. HEENT:  No headaches, Difficulty swallowing,Tooth/dental problems,Sore throat,  No sneezing, itching, ear ache, nasal congestion, post nasal drip,  Cardio-vascular:  No chest pain, Orthopnea, PND, swelling in lower extremities, anasarca, palpitations  GI:  No heartburn, indigestion, abdominal pain, nausea, vomiting, diarrhea, change in bowel habits, loss of appetite  Resp:   No shortness of breath with exertion or at rest. No excess mucus, no productive cough, No non-productive cough, No coughing up of blood.No change in color of mucus.No wheezing.No chest wall deformity  Skin:  no rash or lesions.  GU:  no dysuria, change in color of urine, no urgency or frequency. No flank pain.  Musculoskeletal:   No joint pain or swelling. No decreased range of motion. No back pain.  Psych:  No change in mood or affect. No depression or anxiety. No memory loss.   Past  Medical History  Diagnosis Date  . Diabetes mellitus   . Hypertension   . Arthritis   . Shingles   . hypothyroidism   . Bronchitis   . Hyperlipidemia   . GERD (gastroesophageal reflux disease)    Past Surgical History  Procedure Laterality Date  . Cholecystectomy    . Ankle surgery      x 4  . Non cancerous mass removed      small intestine  . Tonsillectomy    . Nasal sinus surgery    . Cataract extraction Bilateral   . Appendectomy    . Knee arthroscopy     Social History:  reports that she has never smoked. She has never used smokeless tobacco. She reports that she does not drink alcohol or use illicit drugs.  Allergies  Allergen Reactions  . Sulfonamide Derivatives Anaphylaxis  . Erythromycin Nausea And Vomiting  . Mucinex [Guaifenesin Er] Nausea And Vomiting  . Aspirin Rash  . Levofloxacin Rash    Family History  Problem Relation Age of Onset  . Diabetes Mother   . Asthma Mother   . Hypertension Father   . Heart disease Father   . Diabetes Father   . Heart disease Sister   . Hypertension Sister   . Asthma Sister   . Diabetes Brother   . CAD Brother   . Stroke Brother      Prior to Admission medications   Medication Sig Start Date End Date Taking? Authorizing Provider  acetaminophen (TYLENOL) 500 MG tablet Take  1,000 mg by mouth every 6 (six) hours as needed for headache.   Yes Historical Provider, MD  carisoprodol (SOMA) 350 MG tablet Take 175-350 mg by mouth daily as needed for muscle spasms. For cramps   Yes Historical Provider, MD  esomeprazole (NEXIUM) 40 MG capsule Take 40 mg by mouth daily before breakfast.     Yes Historical Provider, MD  furosemide (LASIX) 20 MG tablet Take 20 mg by mouth daily as needed for fluid.    Yes Historical Provider, MD  insulin regular human CONCENTRATED (HUMULIN R) 500 UNIT/ML SOLN injection Inject 10-24 Units into the skin 3 (three) times daily with meals. Per sliding scale.   Yes Historical Provider, MD  levothyroxine  (SYNTHROID, LEVOTHROID) 125 MCG tablet Take 125 mcg by mouth daily.     Yes Historical Provider, MD  PARoxetine (PAXIL) 30 MG tablet Take 30 mg by mouth daily.   Yes Historical Provider, MD  Polyethylene Glycol 400 (BLINK TEARS OP) Place 2 drops into both eyes 3 (three) times daily.   Yes Historical Provider, MD  quinapril (ACCUPRIL) 40 MG tablet Take 1 tablet (40 mg total) by mouth daily. 05/30/13  Yes Burnell Blanks, MD  Senna-Psyllium (PERDIEM PO) Take 1 tablet by mouth daily as needed.    Yes Historical Provider, MD  simvastatin (ZOCOR) 20 MG tablet Take 20 mg by mouth at bedtime.     Yes Historical Provider, MD  sulfamethoxazole-trimethoprim (BACTRIM DS,SEPTRA DS) 800-160 MG per tablet Take 1 tablet by mouth 2 (two) times daily. Starting 10/27/2014 x 10 days.   Yes Historical Provider, MD  cefUROXime (CEFTIN) 500 MG tablet Take 1 tablet (500 mg total) by mouth 2 (two) times daily with a meal. Patient not taking: Reported on 10/31/2014 10/01/14   Asencion Noble, MD   Physical Exam: Filed Vitals:   10/31/14 1259 10/31/14 1501 10/31/14 1645 10/31/14 1729  BP: 198/75  200/85 196/74  Pulse: 89  82 76  Temp: 98 F (36.7 C) 98.3 F (36.8 C)    TempSrc: Oral     Resp: _0 Height: _1  (1.727 m)     Weight: 102.059 kg (225 lb)     SpO2: 97%  100% 96%    Wt Readings from Last 3 Encounters:  10/31/14 102.059 kg (225 lb)  09/30/14 100 kg (220 lb 7.4 oz)  07/14/13 101.606 kg (224 lb)    General:  Appears calm and comfortable Eyes: Bilat artificial lens present,  PERRL, normal lids, irises & conjunctiva ENT:  Dry MM Neck:  no LAD, masses or thyromegaly Cardiovascular:  RRR, no m/r/g. No LE edema. Telemetry:  SR, no arrhythmias  Respiratory:  CTA bilaterally, no w/r/r. Normal respiratory effort. Abdomen:  soft, ntnd Skin:  no rash or induration seen on limited exam, tenderness to palpation along the skin of the left temporal region Musculoskeletal:  grossly normal tone  BUE/BLE Psychiatric:  grossly normal mood and affect, speech fluent and appropriate Neurologic: CN2-12 intact, no dysmetria, sits up unassisted, moves all cherries and coordinated fashion.          Labs on Admission:  Basic Metabolic Panel:  Recent Labs Lab 10/31/14 1400  NA 135  K 3.9  CL 104  CO2 23  GLUCOSE 243*  BUN 13  CREATININE 0.75  CALCIUM 8.4*   Liver Function Tests: No results for input(s): AST, ALT, ALKPHOS, BILITOT, PROT, ALBUMIN in the last 168 hours. No results for input(s): LIPASE, AMYLASE in the last 168  hours. No results for input(s): AMMONIA in the last 168 hours. CBC:  Recent Labs Lab 10/31/14 1400  WBC 3.6*  NEUTROABS 2.9  HGB 10.9*  HCT 33.4*  MCV 82.1  PLT 130*   Cardiac Enzymes:  Recent Labs Lab 10/31/14 1400  TROPONINI <0.03    BNP (last 3 results) No results for input(s): BNP in the last 8760 hours.  ProBNP (last 3 results) No results for input(s): PROBNP in the last 8760 hours.  CBG:  Recent Labs Lab 10/31/14 1329  GLUCAP 210*    Radiological Exams on Admission: Ct Head Wo Contrast  10/31/2014   CLINICAL DATA:  Generalized weakness and headache for 2 weeks. Fell at home last night. Loss of consciousness.  EXAM: CT HEAD WITHOUT CONTRAST  TECHNIQUE: Contiguous axial images were obtained from the base of the skull through the vertex without intravenous contrast.  COMPARISON:  07/14/2013  FINDINGS: No acute intracranial abnormality. Specifically, no hemorrhage, hydrocephalus, mass lesion, acute infarction, or significant intracranial injury. No acute calvarial abnormality.  Mucosal thickening in the paranasal sinuses. No air-fluid levels. Mastoid air cells are clear.  IMPRESSION: No acute intracranial abnormality.  Chronic sinusitis.   Electronically Signed   By: Rolm Baptise M.D.   On: 10/31/2014 15:30    EKG: Independently reviewed. Sinus, prolonged QT, no sign of ACS  Assessment/Plan Principal Problem:   Headache, migraine,  intractable Active Problems:   GERD   Headache   Syncope   Vertigo   Poorly controlled diabetes mellitus   Essential hypertension   HLD (hyperlipidemia)   Depression   GERD (gastroesophageal reflux disease)   Hypothyroid   HA: Etiology not immediately clear. May be due to intractable migraine, giant cell arteritis, frequent orthostasis episodes, infectious process, or other intracranial process. Headache is predominantly on the left temporal location, associated with sensitivity to light Patient recently treated for possible neck infection with Bactrim with little to no improvement. No nuchal rigidity and doubt meningitis. Head CT without evidence of acute process. Neurologically intact. - Tele  - ESR CRP - MRI when available - orthostatic vital signs when tolerated - Decadron 31m , Zofran 448m Benadryl 2512mTylenol 1 g  Syncope: Multiple bouts of syncope over the last several weeks. Patient states that these are fairly routine for her. Refusing orthostatic vital signs on admission she states that she will almost certainly pass out if we stand her up and does not want to do this. Head CT without signs of acute process. - IVF - Orthostatic vital signs when able  Veritgo: Intermittent vertigo for the last several days currently resolved. - Monitor  Hypertension: Hypertensive on presentation. - Continue Accupril - Hydralazine when necessary SBP greater than 180  Extremities edema: - Continue Lasix PRN  Hypothyroid: - Continue Synthroid  HLD: - Continue Zocor  Diabetes: Last A1c 9.4 on 09/27/2014. - SSI  Depression: - Continue Paxil  GERD: - Continue PPI  Code Status: FULL DVT Prophylaxis: Hep Family Communication: None Disposition Plan: pending improvement  MERRELL, DAVID J, Lenna SciaraD Family Medicine [facility] [email or web address] Password [email or web address]

## 2014-10-31 NOTE — ED Notes (Signed)
Dr Marily Memos at bedside speaking with pt,

## 2014-10-31 NOTE — ED Notes (Signed)
Dr Marily Memos notified of pt's pain scale and bp, advised that he would be placing orders for pt,

## 2014-10-31 NOTE — ED Notes (Signed)
Per EMS, pt from home. Pt c/o of generalized weakness and HA x 2 weeks. Per patient, "I fell at home last night, lost consciousness, and two hours later I woke up. Pt denies hitting head. Pt fell on thick carpet. Pt did not think she needed to be seen last night. VSS. CBG 236 at home.

## 2014-10-31 NOTE — ED Provider Notes (Signed)
CSN: [id number]     Arrival date & time 10/31/14  1253 History   First MD Initiated Contact with Patient 10/31/14 1419     Chief Complaint  Patient presents with  . Fatigue     Patient is a 69 y.o. female presenting with weakness. The history is provided by the patient.  Weakness This is a new problem. The current episode started more than 1 week ago. The problem occurs daily. The problem has been gradually worsening. Associated symptoms include headaches. Pertinent negatives include no chest pain and no shortness of breath. The symptoms are aggravated by exertion. Nothing relieves the symptoms.  Pt reports fatigue and frontal HA for over a week She now reports significant vertigo/dizziness, worse over past 24 hours She reports last night she fell and "was out for 2 hours" EMS came to house and evaluated her and she decided to stay home. She reports continued dizziness today and difficulty walking  No CP/SOB No new visual changes  She denies any trauma from the fall yesterday  She reports left neck lymphadenopathy, her PCP is aware and she has been on ABX for this condition  Past Medical History  Diagnosis Date  . Diabetes mellitus   . Hypertension   . Arthritis   . Shingles   . hypothyroidism   . Bronchitis   . Hyperlipidemia   . GERD (gastroesophageal reflux disease)    Past Surgical History  Procedure Laterality Date  . Cholecystectomy    . Ankle surgery      x 4  . Non cancerous mass removed      small intestine  . Tonsillectomy    . Nasal sinus surgery    . Cataract extraction Bilateral   . Appendectomy    . Knee arthroscopy     Family History  Problem Relation Age of Onset  . Diabetes Mother   . Asthma Mother   . Hypertension Father   . Heart disease Father   . Diabetes Father   . Heart disease Sister   . Hypertension Sister   . Asthma Sister   . Diabetes Brother   . CAD Brother   . Stroke Brother    History  Substance Use Topics  . Smoking  status: Never Smoker   . Smokeless tobacco: Never Used  . Alcohol Use: No   OB History    No data available     Review of Systems  Respiratory: Negative for shortness of breath.   Cardiovascular: Negative for chest pain.  Neurological: Positive for weakness and headaches.  All other systems reviewed and are negative.     Allergies  Sulfonamide derivatives; Erythromycin; Mucinex; Aspirin; and Levofloxacin  Home Medications   Prior to Admission medications   Medication Sig Start Date End Date Taking? Authorizing Provider  acetaminophen (TYLENOL) 500 MG tablet Take 1,000 mg by mouth every 6 (six) hours as needed for headache.   Yes Historical Provider, MD  carisoprodol (SOMA) 350 MG tablet Take 175-350 mg by mouth daily as needed for muscle spasms. For cramps   Yes Historical Provider, MD  esomeprazole (NEXIUM) 40 MG capsule Take 40 mg by mouth daily before breakfast.     Yes Historical Provider, MD  furosemide (LASIX) 20 MG tablet Take 20 mg by mouth daily as needed for fluid.    Yes Historical Provider, MD  insulin regular human CONCENTRATED (HUMULIN R) 500 UNIT/ML SOLN injection Inject 10-24 Units into the skin 3 (three) times daily with meals. Per sliding scale.  Yes Historical Provider, MD  levothyroxine (SYNTHROID, LEVOTHROID) 125 MCG tablet Take 125 mcg by mouth daily.     Yes Historical Provider, MD  PARoxetine (PAXIL) 30 MG tablet Take 30 mg by mouth daily.   Yes Historical Provider, MD  Polyethylene Glycol 400 (BLINK TEARS OP) Place 2 drops into both eyes 3 (three) times daily.   Yes Historical Provider, MD  quinapril (ACCUPRIL) 40 MG tablet Take 1 tablet (40 mg total) by mouth daily. 05/30/13  Yes Burnell Blanks, MD  Senna-Psyllium (PERDIEM PO) Take 1 tablet by mouth daily as needed.    Yes Historical Provider, MD  simvastatin (ZOCOR) 20 MG tablet Take 20 mg by mouth at bedtime.     Yes Historical Provider, MD  sulfamethoxazole-trimethoprim (BACTRIM DS,SEPTRA DS)  800-160 MG per tablet Take 1 tablet by mouth 2 (two) times daily. Starting 10/27/2014 x 10 days.   Yes Historical Provider, MD  cefUROXime (CEFTIN) 500 MG tablet Take 1 tablet (500 mg total) by mouth 2 (two) times daily with a meal. Patient not taking: Reported on 10/31/2014 10/01/14   Asencion Noble, MD   BP 198/75 mmHg  Pulse 89  Temp(Src) 98.3 F (36.8 C) (Oral)  Resp 18  Ht 5\' 8"  (1.727 m)  Wt 225 lb (102.059 kg)  BMI 34.22 kg/m2  SpO2 97% Physical Exam CONSTITUTIONAL: Well developed/well nourished HEAD: Normocephalic/atraumatic EYES: EOMI/PERRL, no nystagmus, no ptosis ENMT: Mucous membranes moist NECK: supple no meningeal signs, no bruits.  Left anterior neck is tender to palpation, no erythema noted SPINE/BACK:entire spine nontender CV: S1/S2 noted, no murmurs/rubs/gallops noted LUNGS: Lungs are clear to auscultation bilaterally, no apparent distress ABDOMEN: soft, nontender, no rebound or guarding GU:no cva tenderness NEURO:Awake/alert, facies symmetric, no arm or leg drift is noted Equal 5/5 strength with shoulder abduction, elbow flex/extension, wrist flex/extension in upper extremities and equal hand grips bilaterally Cranial nerves 3/4/5/6/12/18/08/11/12 tested and intact No past pointing EXTREMITIES: pulses normal, full ROM SKIN: warm, color normal PSYCH: no abnormalities of mood noted, alert and oriented to situation   ED Course  Procedures  4:20 PM tPA in stroke considered but not given due to: Onset over 3-4.5hours   Initial workup negative Pt still unable to stand/ambulate Will need admission and potential MRI to r/o occult CVA Pt agreeable D/w dr Marily Memos for admission I doubt SAH/meningitis at this time   [place] - Abnormal; Notable for the following:    Glucose, Bld 243 (*)    Calcium 8.4 (*)    All other components within normal limits  CBC WITH DIFFERENTIAL/PLATELET - Abnormal; Notable for the following:    WBC  3.6 (*)    Hemoglobin 10.9 (*)    HCT 33.4 (*)    Platelets 130 (*)    Neutrophils Relative % 80 (*)    Lymphs Abs 0.4 (*)    All other components within normal limits  CBG MONITORING, ED - Abnormal; Notable for the following:    Glucose-Capillary 210 (*)    All other components within normal limits  TROPONIN I    Imaging Review Ct Head Wo Contrast  10/31/2014   CLINICAL DATA:  Generalized weakness and headache for 2 weeks. Fell at home last night. Loss of consciousness.  EXAM: CT HEAD WITHOUT CONTRAST  TECHNIQUE: Contiguous axial images were obtained from the base of the skull through the vertex without intravenous contrast.  COMPARISON:  07/14/2013  FINDINGS: No acute intracranial abnormality. Specifically, no hemorrhage, hydrocephalus, mass  lesion, acute infarction, or significant intracranial injury. No acute calvarial abnormality.  Mucosal thickening in the paranasal sinuses. No air-fluid levels. Mastoid air cells are clear.  IMPRESSION: No acute intracranial abnormality.  Chronic sinusitis.   Electronically Signed   By: Rolm Baptise M.D.   On: 10/31/2014 15:30     EKG Interpretation   Date/Time:  Saturday Oct 31 2014 13:31:14 EDT Ventricular Rate:  81 PR Interval:  160 QRS Duration: 82 QT Interval:  459 QTC Calculation: 533 R Axis:   34 Text Interpretation:  Sinus rhythm Prolonged QT interval Baseline wander  in lead(s) II qt more prolonged on this EKG Confirmed by Christy Gentles  MD,  Elenore Rota ([id number]) on 10/31/2014 2:52:53 PM      MDM   Final diagnoses:  Intractable headache  Vertigo    Nursing notes including past medical history and social history reviewed and considered in documentation Labs/vital reviewed myself and considered during evaluation Previous records reviewed and considered     Ripley Fraise, MD 10/31/14 1622

## 2014-10-31 NOTE — ED Notes (Signed)
Hospitalist at bedside,

## 2014-10-31 NOTE — ED Notes (Signed)
Pt states that at first the pain medication seemed to help but then the headache increased again,

## 2014-10-31 NOTE — ED Notes (Signed)
Attempted to stand pt for orthostatic vitals, pt states that her legs are too weak to stand on,

## 2014-10-31 NOTE — ED Notes (Signed)
Dr Barbaraann Faster notified of bp and unchanged pain, additional orders given,

## 2014-10-31 NOTE — ED Notes (Signed)
Pt assisted to bedpan, urine had leaked onto sheet, pt refused to have nursing staff change bed linen at present time, requesting to have chucks placed under her instead. Two chucks placed under pt per request,

## 2014-10-31 NOTE — ED Notes (Signed)
Dr Marily Memos notified of pt's vital signs, advised that he would be down to speak with pt,

## 2014-11-01 LAB — GLUCOSE, CAPILLARY
Glucose-Capillary: 407 mg/dL — ABNORMAL HIGH (ref 65–99)
Glucose-Capillary: 462 mg/dL — ABNORMAL HIGH (ref 65–99)
Glucose-Capillary: 386 mg/dL — ABNORMAL HIGH (ref 65–99)
Glucose-Capillary: 260 mg/dL — ABNORMAL HIGH (ref 65–99)
Glucose-Capillary: 59 mg/dL — ABNORMAL LOW (ref 65–99)
Glucose-Capillary: 116 mg/dL — ABNORMAL HIGH (ref 65–99)

## 2014-11-01 MED ORDER — INSULIN REGULAR HUMAN (CONC) 500 UNIT/ML ~~LOC~~ SOLN
10.0000 [IU] | Freq: Three times a day (TID) | SUBCUTANEOUS | Status: DC
  Filled 2014-11-01: qty 20

## 2014-11-01 MED ORDER — CEPHALEXIN 500 MG PO CAPS
500.0000 mg | ORAL_CAPSULE | Freq: Three times a day (TID) | ORAL | Status: DC
  Administered 2014-11-01 – 2014-11-03 (×7): 500 mg via ORAL
  Filled 2014-11-01 (×7): qty 1

## 2014-11-01 MED ORDER — DIAZEPAM 2 MG PO TABS
2.0000 mg | ORAL_TABLET | Freq: Two times a day (BID) | ORAL | Status: DC
  Administered 2014-11-01 – 2014-11-03 (×5): 2 mg via ORAL
  Filled 2014-11-01 (×5): qty 1

## 2014-11-01 MED ORDER — INSULIN DETEMIR 100 UNIT/ML ~~LOC~~ SOLN: 40.0000 [IU] | Freq: Every day | SUBCUTANEOUS | Status: DC

## 2014-11-01 MED ORDER — LISINOPRIL 10 MG PO TABS
40.0000 mg | ORAL_TABLET | Freq: Every day | ORAL | Status: DC
  Administered 2014-11-01 – 2014-11-03 (×3): 40 mg via ORAL
  Filled 2014-11-01 (×2): qty 4

## 2014-11-01 MED ORDER — INSULIN REGULAR HUMAN (CONC) 500 UNIT/ML ~~LOC~~ SOLN
100.0000 [IU] | Freq: Three times a day (TID) | SUBCUTANEOUS | Status: DC
  Filled 2014-11-01: qty 20

## 2014-11-01 MED ORDER — INSULIN ASPART 100 UNIT/ML ~~LOC~~ SOLN: 100.0000 [IU] | Freq: Three times a day (TID) | SUBCUTANEOUS | Status: DC

## 2014-11-01 MED ORDER — AMLODIPINE BESYLATE 5 MG PO TABS
5.0000 mg | ORAL_TABLET | Freq: Every day | ORAL | Status: DC
  Administered 2014-11-01 – 2014-11-03 (×3): 5 mg via ORAL
  Filled 2014-11-01 (×3): qty 1

## 2014-11-01 MED ORDER — CARISOPRODOL 350 MG PO TABS: 350.0000 mg | ORAL_TABLET | Freq: Every day | ORAL | Status: DC | PRN

## 2014-11-01 MED ORDER — INSULIN ASPART 100 UNIT/ML ~~LOC~~ SOLN
50.0000 [IU] | Freq: Three times a day (TID) | SUBCUTANEOUS | Status: DC
  Administered 2014-11-01 – 2014-11-02 (×3): 50 [IU] via SUBCUTANEOUS

## 2014-11-01 NOTE — Progress Notes (Signed)
Notified Dr. Legrand Rams of accucheck 464 instructed to give insulin according to sliding scale and recheck blood sugar.

## 2014-11-01 NOTE — Progress Notes (Signed)
Utilization review Completed Ulani Degrasse RN BSN   

## 2014-11-01 NOTE — Progress Notes (Signed)
Notified Dr. Legrand Rams of accucheck 407 . Order given to give insulin according to sliding scale.

## 2014-11-01 NOTE — Progress Notes (Signed)
Subjective: Jaclyn Herrera was admitted yesterday following a syncopal episode. She describes vertigo-like dizziness recently especially upon head turning. She has CT scan of the head which is unremarkable. She has been hypertensive. She is hyperglycemic. She has been treated for a left parotitis this past week. She denies fever. She has had headaches. Her sedimentation rate is normal.  Objective: Vital signs in last 24 hours: Filed Vitals:   10/31/14 2130 10/31/14 2224 11/01/14 0015 11/01/14 0537  BP: 179/73 182/73 188/72 179/74  Pulse:  88 90 89  Temp:  98.4 F (36.9 C)  98.2 F (36.8 C)  TempSrc:  Oral  Oral  Resp: 21 20  20   Height:      Weight:      SpO2:  98%  96%   Weight change:   Intake/Output Summary (Last 24 hours) at 11/01/14 0914 Last data filed at 11/01/14 0655  Gross per 24 hour  Intake   1200 ml  Output   1000 ml  Net    200 ml    Physical Exam: Alert. Affect is happy. Head appears atraumatic and normocephalic. Eyes normal. Neck supple. Lungs clear. Heart regular with no murmurs. Abdomen is soft and nontender with no organomegaly. Extremities reveal no edema. Neuro exam reveals no focal weakness. Gait was not tested.  Lab Results:    Results for orders placed or performed during the hospital encounter of 10/31/14 (from the past 24 hour(s))  CBG monitoring, ED     Status: Abnormal   Collection Time: 10/31/14  1:29 PM  Result Value Ref Range   Glucose-Capillary 210 (H) 65 - 99 mg/dL  Basic metabolic panel     Status: Abnormal   Collection Time: 10/31/14  2:00 PM  Result Value Ref Range   Sodium 135 135 - 145 mmol/L   Potassium 3.9 3.5 - 5.1 mmol/L   Chloride 104 101 - 111 mmol/L   CO2 23 22 - 32 mmol/L   Glucose, Bld 243 (H) 65 - 99 mg/dL   BUN 13 6 - 20 mg/dL   Creatinine, Ser 0.75 0.44 - 1.00 mg/dL   Calcium 8.4 (L) 8.9 - 10.3 mg/dL   GFR calc non Af Amer >60 >60 mL/min   GFR calc Af Amer >60 >60 mL/min   Anion gap 8 5 - 15  CBC with Differential/Platelet      Status: Abnormal   Collection Time: 10/31/14  2:00 PM  Result Value Ref Range   WBC 3.6 (L) 4.0 - 10.5 K/uL   RBC 4.07 3.87 - 5.11 MIL/uL   Hemoglobin 10.9 (L) 12.0 - 15.0 g/dL   HCT 33.4 (L) 36.0 - 46.0 %   MCV 82.1 78.0 - 100.0 fL   MCH 26.8 26.0 - 34.0 pg   MCHC 32.6 30.0 - 36.0 g/dL   RDW 15.5 11.5 - 15.5 %   Platelets 130 (L) 150 - 400 K/uL   Neutrophils Relative % 80 (H) 43 - 77 %   Neutro Abs 2.9 1.7 - 7.7 K/uL   Lymphocytes Relative 12 12 - 46 %   Lymphs Abs 0.4 (L) 0.7 - 4.0 K/uL   Monocytes Relative 7 3 - 12 %   Monocytes Absolute 0.3 0.1 - 1.0 K/uL   Eosinophils Relative 1 0 - 5 %   Eosinophils Absolute 0.1 0.0 - 0.7 K/uL   Basophils Relative 0 0 - 1 %   Basophils Absolute 0.0 0.0 - 0.1 K/uL  Troponin I     Status: None  Collection Time: 10/31/14  2:00 PM  Result Value Ref Range   Troponin I <0.03 <0.031 ng/mL  C-reactive protein     Status: None   Collection Time: 10/31/14  2:00 PM  Result Value Ref Range   CRP <0.5 <1.0 mg/dL  Sedimentation rate     Status: None   Collection Time: 10/31/14  5:54 PM  Result Value Ref Range   Sed Rate 12 0 - 22 mm/hr  CBG monitoring, ED     Status: Abnormal   Collection Time: 10/31/14  7:43 PM  Result Value Ref Range   Glucose-Capillary 200 (H) 65 - 99 mg/dL  Glucose, capillary     Status: Abnormal   Collection Time: 10/31/14 10:26 PM  Result Value Ref Range   Glucose-Capillary 309 (H) 65 - 99 mg/dL  Glucose, capillary     Status: Abnormal   Collection Time: 11/01/14  7:55 AM  Result Value Ref Range   Glucose-Capillary 407 (H) 65 - 99 mg/dL   Comment 1 Notify RN    Comment 2 Document in Chart      ABGS No results for input(s): PHART, PO2ART, TCO2, HCO3 in the last 72 hours.  Invalid input(s): PCO2 CULTURES No results found for this or any previous visit (from the past 240 hour(s)). Studies/Results: Ct Head Wo Contrast  10/31/2014   CLINICAL DATA:  Generalized weakness and headache for 2 weeks. Fell at home last  night. Loss of consciousness.  EXAM: CT HEAD WITHOUT CONTRAST  TECHNIQUE: Contiguous axial images were obtained from the base of the skull through the vertex without intravenous contrast.  COMPARISON:  07/14/2013  FINDINGS: No acute intracranial abnormality. Specifically, no hemorrhage, hydrocephalus, mass lesion, acute infarction, or significant intracranial injury. No acute calvarial abnormality.  Mucosal thickening in the paranasal sinuses. No air-fluid levels. Mastoid air cells are clear.  IMPRESSION: No acute intracranial abnormality.  Chronic sinusitis.   Electronically Signed   By: Rolm Baptise M.D.   On: 10/31/2014 15:30   Micro Results: No results found for this or any previous visit (from the past 240 hour(s)). Studies/Results: Ct Head Wo Contrast  10/31/2014   CLINICAL DATA:  Generalized weakness and headache for 2 weeks. Fell at home last night. Loss of consciousness.  EXAM: CT HEAD WITHOUT CONTRAST  TECHNIQUE: Contiguous axial images were obtained from the base of the skull through the vertex without intravenous contrast.  COMPARISON:  07/14/2013  FINDINGS: No acute intracranial abnormality. Specifically, no hemorrhage, hydrocephalus, mass lesion, acute infarction, or significant intracranial injury. No acute calvarial abnormality.  Mucosal thickening in the paranasal sinuses. No air-fluid levels. Mastoid air cells are clear.  IMPRESSION: No acute intracranial abnormality.  Chronic sinusitis.   Electronically Signed   By: Rolm Baptise M.D.   On: 10/31/2014 15:30   Medications:  I have reviewed the patient's current medications Scheduled Meds: . acetaminophen  1,000 mg Oral Once  . amLODipine  5 mg Oral Daily  . cephALEXin  500 mg Oral 3 times per day  . diazepam  2 mg Oral BID  . heparin  5,000 Units Subcutaneous 3 times per day  . insulin aspart  0-20 Units Subcutaneous TID WC  . insulin aspart  0-5 Units Subcutaneous QHS  . levothyroxine  125 mcg Oral Daily  . lisinopril  40 mg Oral  Daily  . lisinopril  40 mg Oral Daily  . pantoprazole  80 mg Oral Q1200  . PARoxetine  30 mg Oral Daily  . simvastatin  20  mg Oral QHS  . sodium chloride  3 mL Intravenous Q12H   Continuous Infusions: . sodium chloride 100 mL/hr at 10/31/14 1745   PRN Meds:.acetaminophen **OR** acetaminophen, carisoprodol, furosemide, hydrALAZINE, ondansetron **OR** ondansetron (ZOFRAN) IV, oxyCODONE   Assessment/Plan: #1. Syncope. Continue monitoring. MRI of the brain has been ordered. Consult neurology. #2. Vertigo. Start diazepam 2 mg twice a day. #3. Diabetes. Restart U 500 10 units 3 times a day. #4. Hypertension. She is on lisinopril in place of Accupril. Add amlodipine 5 mg daily. #5. Thrombocytopenia. 130,000. Principal Problem:   Headache, migraine, intractable Active Problems:   GERD   Headache   Syncope   Vertigo   Poorly controlled diabetes mellitus   Essential hypertension   HLD (hyperlipidemia)   Depression   GERD (gastroesophageal reflux disease)   Hypothyroid     LOS: 1 day   Rozalia Dino 11/01/2014, 9:14 AM

## 2014-11-02 ENCOUNTER — Inpatient Hospital Stay (HOSPITAL_COMMUNITY): Payer: Medicare Other

## 2014-11-02 ENCOUNTER — Inpatient Hospital Stay (HOSPITAL_COMMUNITY)
Admit: 2014-11-02 | Discharge: 2014-11-02 | Disposition: A | Discharge status: Home / Self Care | Payer: Medicare Other | Attending: Neurology | Admitting: Neurology

## 2014-11-02 LAB — GLUCOSE, CAPILLARY
Glucose-Capillary: 298 mg/dL — ABNORMAL HIGH (ref 65–99)
Glucose-Capillary: 176 mg/dL — ABNORMAL HIGH (ref 65–99)
Glucose-Capillary: 130 mg/dL — ABNORMAL HIGH (ref 65–99)
Glucose-Capillary: 155 mg/dL — ABNORMAL HIGH (ref 65–99)

## 2014-11-02 LAB — TROPONIN I
Troponin I: <0.03 ng/mL (ref <0.031)
Troponin I: 0.03 ng/mL (ref <0.031)
Troponin I: <0.03 ng/mL (ref <0.031)

## 2014-11-02 MED ORDER — ALUM & MAG HYDROXIDE-SIMETH 200-200-20 MG/5ML PO SUSP: 30.0000 mL | ORAL | Status: DC | PRN

## 2014-11-02 MED ORDER — INSULIN ASPART 100 UNIT/ML ~~LOC~~ SOLN
15.0000 [IU] | Freq: Three times a day (TID) | SUBCUTANEOUS | Status: DC
  Administered 2014-11-02 – 2014-11-03 (×4): 15 [IU] via SUBCUTANEOUS

## 2014-11-02 MED ORDER — INSULIN GLARGINE 100 UNIT/ML ~~LOC~~ SOLN
25.0000 [IU] | Freq: Two times a day (BID) | SUBCUTANEOUS | Status: DC
  Administered 2014-11-02 – 2014-11-03 (×3): 25 [IU] via SUBCUTANEOUS
  Filled 2014-11-02 (×5): qty 0.25

## 2014-11-02 NOTE — Progress Notes (Signed)
Inpatient Diabetes Program Recommendations  AACE/ADA: New Consensus Statement on Inpatient Glycemic Control (2013)  Target Ranges:  Prepandial:   less than 140 mg/dL      Peak postprandial:   less than 180 mg/dL (1-2 hours)      Critically ill patients:  140 - 180 mg/dL   Results for RIVER, VIOLET (MRN [id number]) as of 11/02/2014 08:10  Ref. Range 11/01/2014 07:55 11/01/2014 11:57 11/01/2014 14:34 11/01/2014 16:42 11/01/2014 21:24 11/01/2014 22:57 11/02/2014 07:59  Glucose-Capillary Latest Ref Range: 65-99 mg/dL 407 (H) 462 (H) 386 (H) 260 (H) 59 (L) 116 (H) 298 (H)    Diabetes history: DM2 Outpatient Diabetes medications: Humulin R U500 50 units TID plus additional units based on correction scale Current orders for Inpatient glycemic control: Novolog 0-20 units TID with meals, Novolog 0-5 units HS, Novolog 50 units TID with meals for meal coverage  Inpatient Diabetes Program Recommendations Insulin - Basal: Please consider ordering Lantus 25 units BID. Insulin - Meal Coverage: Please consider decreasing meal coverage to Novolog 15 units TID with meals.  Note: Patient takes Humulin R U500 as an outpatient which has the onset of rapid acting insulin and the duration of intermediate acting insulin. CBGs ranged from 59-462 mg/dl on 11/01/14 and fasting glucose this morning is 298 mg/dl at 7:59. Patient needs to be on basal insulin in addition to Novolog correction and meal coverage. Please consider ordering Lantus 25 units BID and decreasing meal coverage to Novolog 15 units TID with meals.  Thanks, Jaclyn Alderman, RN, MSN, CCRN, CDE Diabetes Coordinator Inpatient Diabetes Program [phone] (Team Pager from [place] to [place]) [phone] ([facility] office) [phone] [facility] office)

## 2014-11-02 NOTE — Consult Note (Signed)
Jaclyn A. Merlene Laughter, MD     [email or web address]          Jaclyn Herrera is an 68 y.o. female.   ASSESSMENT/PLAN: Episode of unresponsiveness of unclear etiology. The presentation is suspicious for  unwitnessed seizure. Although concerns includes fall with concussive syndrome and cardiac causes. Ischemic stroke or intracranial processes are unlikely. Agree with orthostatic measurements/vitals. An EEG will be obtained. Follow-up MRI.  The patient reports that she was up in the kitchen when she had a sensation to use the restroom. She essentially went to the bathroom to urinate but did not get there. The patient reports that everything went black and she passed out. She found herself on the floor 2 hours later. She does not report having oral trauma or urinary incontinence. No chest pain, shortness of breath, dysarthria or dysphagia is reported. No focal numbness or weakness. No tonoclonic activities are reported. There is no previous history of seizures. She reports that she is back to baseline but does report having headaches for about 3 weeks before she came to the hospital. Headaches were bifrontal. She was given a cocktail of medications in the emergency room and this got rid of the headaches persist this report having some mild soreness left frontal region where she apparently hit her head. Otherwise, the review systems is negative.  GENERAL: This is a very pleasant female in no acute distress.  HEENT: Supple. Atraumatic normocephalic. Mild soreness left frontal region.  ABDOMEN: soft  EXTREMITIES: [name] of the distal right leg. 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   BACK: Normal.  SKIN: Normal by inspection.    MENTAL STATUS: Alert and oriented. Speech, language and cognition are generally intact. Judgment and insight normal.   CRANIAL NERVES: Pupils are equal, round and reactive to  light and accommodation; extra ocular movements are full, there is no significant nystagmus; visual fields are full; upper and lower facial muscles are normal in strength and symmetric, there is no flattening of the nasolabial folds; tongue is midline; uvula is midline; shoulder elevation is normal.  MOTOR: Normal tone, bulk and strength; no pronator drift.  COORDINATION: Left finger to nose is normal, right finger to nose is normal, No rest tremor; no intention tremor; no postural tremor; no bradykinesia.  REFLEXES: Deep tendon reflexes are symmetrical and normal except for the right knee words absent. Babinski reflexes are flexor bilaterally.   SENSATION: Normal to light touch.      ADMISSION NOTE HOSPITALIST Admitted to [facility] 3-4 wks ago for DKA and has had HA since that time. Single episode last night at 2300. Couple episodes are not uncommon for the patient especially when she is in pain. She reports oftentimes having orthostatic syncope. Patient Called EMS after syncopal episode in her home who evaluated pt who wanted to stay home to rest. PT had been ambulating around the house just prior to event and denies any prodrome. Poor PO for the past several days. Endorses 8lb wt loss in 13 days - intentional. Woke up this am feeling "drunk." No ETOH use. Dizziness (room spinning) since waking up this am. Worse w/ light and movement. HA is pounding over the L side of head and neck .   Blood pressure 175/69, pulse 81, temperature 98 F (36.7 C), temperature source Oral, resp. rate 18, height 5' 8" (1.727 m), weight 102.059 kg (225 lb), SpO2 98 %.  Past Medical History  Diagnosis  Date  . Diabetes mellitus   . Hypertension   . Arthritis   . Shingles   . hypothyroidism   . Bronchitis   . Hyperlipidemia   . GERD (gastroesophageal reflux disease)     Past Surgical History  Procedure Laterality Date  . Cholecystectomy    . Ankle surgery      x 4  . Non cancerous mass removed       small intestine  . Tonsillectomy    . Nasal sinus surgery    . Cataract extraction Bilateral   . Appendectomy    . Knee arthroscopy      Family History  Problem Relation Age of Onset  . Diabetes Mother   . Asthma Mother   . Hypertension Father   . Heart disease Father   . Diabetes Father   . Heart disease Sister   . Hypertension Sister   . Asthma Sister   . Diabetes Brother   . CAD Brother   . Stroke Brother     Social History:  reports that she has never smoked. She has never used smokeless tobacco. She reports that she does not drink alcohol or use illicit drugs.  Allergies:  Allergies  Allergen Reactions  . Sulfonamide Derivatives Anaphylaxis  . Bactrim [Sulfamethoxazole-Trimethoprim]   . Erythromycin Nausea And Vomiting  . Mucinex [Guaifenesin Er] Nausea And Vomiting  . Aspirin Rash  . Levofloxacin Rash    Medications: Prior to Admission medications   Medication Sig Start Date End Date Taking? Authorizing Provider  acetaminophen (TYLENOL) 500 MG tablet Take 1,000 mg by mouth every 6 (six) hours as needed for headache.   Yes Historical Provider, MD  carisoprodol (SOMA) 350 MG tablet Take 175-350 mg by mouth daily as needed for muscle spasms. For cramps   Yes Historical Provider, MD  esomeprazole (NEXIUM) 40 MG capsule Take 40 mg by mouth daily before breakfast.     Yes Historical Provider, MD  furosemide (LASIX) 20 MG tablet Take 20 mg by mouth daily as needed for fluid.    Yes Historical Provider, MD  insulin regular human CONCENTRATED (HUMULIN R) 500 UNIT/ML SOLN injection Inject 50 Units into the skin 3 (three) times daily with meals. Pt states that her endocrinologist (Dr Forde Dandy) and his diabetes educator switched her to U-500 insulin and pt was instructed to use a "standard" insulin syringe and pull dose up to 10 units (0.45m) which is actually 50 UNITS per dose.  Pt does occasionally use more per a "written sliding scale" given to her but she did not have it during  this admission.  Sliding scale range is reported 50-120 units based on blood sugars.  Pt states that she has done really well on the U-500 insulin.   Yes Historical Provider, MD  levothyroxine (SYNTHROID, LEVOTHROID) 125 MCG tablet Take 125 mcg by mouth daily.     Yes Historical Provider, MD  PARoxetine (PAXIL) 30 MG tablet Take 30 mg by mouth daily.   Yes Historical Provider, MD  Polyethylene Glycol 400 (BLINK TEARS OP) Place 2 drops into both eyes 3 (three) times daily.   Yes Historical Provider, MD  quinapril (ACCUPRIL) 40 MG tablet Take 1 tablet (40 mg total) by mouth daily. 05/30/13  Yes CBurnell Blanks MD  Senna-Psyllium (PERDIEM PO) Take 1 tablet by mouth daily as needed.    Yes Historical Provider, MD  simvastatin (ZOCOR) 20 MG tablet Take 20 mg by mouth at bedtime.     Yes Historical Provider, MD  Scheduled Meds: . acetaminophen  1,000 mg Oral Once  . amLODipine  5 mg Oral Daily  . cephALEXin  500 mg Oral 3 times per day  . diazepam  2 mg Oral BID  . heparin  5,000 Units Subcutaneous 3 times per day  . insulin aspart  0-20 Units Subcutaneous TID WC  . insulin aspart  0-5 Units Subcutaneous QHS  . insulin aspart  50 Units Subcutaneous TID WC  . levothyroxine  125 mcg Oral Daily  . lisinopril  40 mg Oral Daily  . pantoprazole  80 mg Oral Q1200  . PARoxetine  30 mg Oral Daily  . simvastatin  20 mg Oral QHS  . sodium chloride  3 mL Intravenous Q12H   Continuous Infusions: . sodium chloride 10 mL/hr at 11/02/14 0836   PRN Meds:.acetaminophen **OR** acetaminophen, alum & mag hydroxide-simeth, carisoprodol, furosemide, hydrALAZINE, ondansetron **OR** ondansetron (ZOFRAN) IV, oxyCODONE     Results for orders placed or performed during the hospital encounter of 10/31/14 (from the past 48 hour(s))  CBG monitoring, ED     Status: Abnormal   Collection Time: 10/31/14  1:29 PM  Result Value Ref Range   Glucose-Capillary 210 (H) 65 - 99 mg/dL  Basic metabolic panel      Status: Abnormal   Collection Time: 10/31/14  2:00 PM  Result Value Ref Range   Sodium 135 135 - 145 mmol/L   Potassium 3.9 3.5 - 5.1 mmol/L   Chloride 104 101 - 111 mmol/L   CO2 23 22 - 32 mmol/L   Glucose, Bld 243 (H) 65 - 99 mg/dL   BUN 13 6 - 20 mg/dL   Creatinine, Ser 0.75 0.44 - 1.00 mg/dL   Calcium 8.4 (L) 8.9 - 10.3 mg/dL   GFR calc non Af Amer >60 >60 mL/min   GFR calc Af Amer >60 >60 mL/min    Comment: (NOTE) The eGFR has been calculated using the CKD EPI equation. This calculation has not been validated in all clinical situations. eGFR's persistently <60 mL/min signify possible Chronic Kidney Disease.    Anion gap 8 5 - 15  CBC with Differential/Platelet     Status: Abnormal   Collection Time: 10/31/14  2:00 PM  Result Value Ref Range   WBC 3.6 (L) 4.0 - 10.5 K/uL   RBC 4.07 3.87 - 5.11 MIL/uL   Hemoglobin 10.9 (L) 12.0 - 15.0 g/dL   HCT 33.4 (L) 36.0 - 46.0 %   MCV 82.1 78.0 - 100.0 fL   MCH 26.8 26.0 - 34.0 pg   MCHC 32.6 30.0 - 36.0 g/dL   RDW 15.5 11.5 - 15.5 %   Platelets 130 (L) 150 - 400 K/uL   Neutrophils Relative % 80 (H) 43 - 77 %   Neutro Abs 2.9 1.7 - 7.7 K/uL   Lymphocytes Relative 12 12 - 46 %   Lymphs Abs 0.4 (L) 0.7 - 4.0 K/uL   Monocytes Relative 7 3 - 12 %   Monocytes Absolute 0.3 0.1 - 1.0 K/uL   Eosinophils Relative 1 0 - 5 %   Eosinophils Absolute 0.1 0.0 - 0.7 K/uL   Basophils Relative 0 0 - 1 %   Basophils Absolute 0.0 0.0 - 0.1 K/uL  Troponin I     Status: None   Collection Time: 10/31/14  2:00 PM  Result Value Ref Range   Troponin I <0.03 <0.031 ng/mL    Comment:        NO INDICATION OF MYOCARDIAL INJURY.  C-reactive protein     Status: None   Collection Time: 10/31/14  2:00 PM  Result Value Ref Range   CRP <0.5 <1.0 mg/dL    Comment: Performed at [facility]  Sedimentation rate     Status: None   Collection Time: 10/31/14  5:54 PM  Result Value Ref Range   Sed Rate 12 0 - 22 mm/hr  CBG monitoring, ED     Status:  Abnormal   Collection Time: 10/31/14  7:43 PM  Result Value Ref Range   Glucose-Capillary 200 (H) 65 - 99 mg/dL  Glucose, capillary     Status: Abnormal   Collection Time: 10/31/14 10:26 PM  Result Value Ref Range   Glucose-Capillary 309 (H) 65 - 99 mg/dL  Glucose, capillary     Status: Abnormal   Collection Time: 11/01/14  7:55 AM  Result Value Ref Range   Glucose-Capillary 407 (H) 65 - 99 mg/dL   Comment 1 Notify RN    Comment 2 Document in Chart   Glucose, capillary     Status: Abnormal   Collection Time: 11/01/14 11:57 AM  Result Value Ref Range   Glucose-Capillary 462 (H) 65 - 99 mg/dL   Comment 1 Notify RN    Comment 2 Document in Chart   Glucose, capillary     Status: Abnormal   Collection Time: 11/01/14  2:34 PM  Result Value Ref Range   Glucose-Capillary 386 (H) 65 - 99 mg/dL   Comment 1 Notify RN    Comment 2 Document in Chart   Glucose, capillary     Status: Abnormal   Collection Time: 11/01/14  4:42 PM  Result Value Ref Range   Glucose-Capillary 260 (H) 65 - 99 mg/dL   Comment 1 Notify RN    Comment 2 Document in Chart   Glucose, capillary     Status: Abnormal   Collection Time: 11/01/14  9:24 PM  Result Value Ref Range   Glucose-Capillary 59 (L) 65 - 99 mg/dL  Glucose, capillary     Status: Abnormal   Collection Time: 11/01/14 10:57 PM  Result Value Ref Range   Glucose-Capillary 116 (H) 65 - 99 mg/dL  Glucose, capillary     Status: Abnormal   Collection Time: 11/02/14  7:59 AM  Result Value Ref Range   Glucose-Capillary 298 (H) 65 - 99 mg/dL    Studies/Results:      A. Merlene Herrera, M.D.  Diplomate, Tax adviser of Psychiatry and Neurology ( Neurology). 11/02/2014, 8:38 AM

## 2014-11-02 NOTE — Progress Notes (Signed)
Pt complaining of chest discomfort. MD notified, stat EKG ordered and MD on the way . Information passed on to first shift nurse.

## 2014-11-02 NOTE — Progress Notes (Signed)
Subjective: Jaclyn Herrera complains of having chest heaviness overnight for 3 hours. She has had some belching. She voices dissatisfaction with the fact that she cannot have Nexium while in the hospital. She denies any diaphoresis or vomiting. She continues to feel vertigo-like dizziness and does not feel that she can walk. She has been able to get up and walk to the bathroom however. She has had a headache.  Objective: Vital signs in last 24 hours: Filed Vitals:   11/01/14 1435 11/01/14 2124 11/02/14 0628 11/02/14 0652  BP: 150/55 163/62 186/67 175/69  Pulse: 88 57 79 81  Temp: 98.8 F (37.1 C) 98.4 F (36.9 C) 98 F (36.7 C)   TempSrc: Oral Oral Oral   Resp: 20 20 18    Height:      Weight:      SpO2: 100% 97% 98%    Weight change:   Intake/Output Summary (Last 24 hours) at 11/02/14 0731 Last data filed at 11/02/14 0128  Gross per 24 hour  Intake 1451.66 ml  Output    800 ml  Net 651.66 ml    Physical Exam: Alert. No distress. Neurologic status grossly intact. Lungs clear. Heart regular with no murmurs. Abdomen soft and nontender. No chest wall tenderness on palpation. Extremities reveal no edema.  Lab Results:    Results for orders placed or performed during the hospital encounter of 10/31/14 (from the past 24 hour(s))  Glucose, capillary     Status: Abnormal   Collection Time: 11/01/14  7:55 AM  Result Value Ref Range   Glucose-Capillary 407 (H) 65 - 99 mg/dL   Comment 1 Notify RN    Comment 2 Document in Chart   Glucose, capillary     Status: Abnormal   Collection Time: 11/01/14 11:57 AM  Result Value Ref Range   Glucose-Capillary 462 (H) 65 - 99 mg/dL   Comment 1 Notify RN    Comment 2 Document in Chart   Glucose, capillary     Status: Abnormal   Collection Time: 11/01/14  2:34 PM  Result Value Ref Range   Glucose-Capillary 386 (H) 65 - 99 mg/dL   Comment 1 Notify RN    Comment 2 Document in Chart   Glucose, capillary     Status: Abnormal   Collection Time:  11/01/14  4:42 PM  Result Value Ref Range   Glucose-Capillary 260 (H) 65 - 99 mg/dL   Comment 1 Notify RN    Comment 2 Document in Chart   Glucose, capillary     Status: Abnormal   Collection Time: 11/01/14  9:24 PM  Result Value Ref Range   Glucose-Capillary 59 (L) 65 - 99 mg/dL  Glucose, capillary     Status: Abnormal   Collection Time: 11/01/14 10:57 PM  Result Value Ref Range   Glucose-Capillary 116 (H) 65 - 99 mg/dL     ABGS No results for input(s): PHART, PO2ART, TCO2, HCO3 in the last 72 hours.  Invalid input(s): PCO2 CULTURES No results found for this or any previous visit (from the past 240 hour(s)). Studies/Results: Ct Head Wo Contrast  10/31/2014   CLINICAL DATA:  Generalized weakness and headache for 2 weeks. Fell at home last night. Loss of consciousness.  EXAM: CT HEAD WITHOUT CONTRAST  TECHNIQUE: Contiguous axial images were obtained from the base of the skull through the vertex without intravenous contrast.  COMPARISON:  07/14/2013  FINDINGS: No acute intracranial abnormality. Specifically, no hemorrhage, hydrocephalus, mass lesion, acute infarction, or significant intracranial injury. No acute  calvarial abnormality.  Mucosal thickening in the paranasal sinuses. No air-fluid levels. Mastoid air cells are clear.  IMPRESSION: No acute intracranial abnormality.  Chronic sinusitis.   Electronically Signed   By: Rolm Baptise M.D.   On: 10/31/2014 15:30   Micro Results: No results found for this or any previous visit (from the past 240 hour(s)). Studies/Results: Ct Head Wo Contrast  10/31/2014   CLINICAL DATA:  Generalized weakness and headache for 2 weeks. Fell at home last night. Loss of consciousness.  EXAM: CT HEAD WITHOUT CONTRAST  TECHNIQUE: Contiguous axial images were obtained from the base of the skull through the vertex without intravenous contrast.  COMPARISON:  07/14/2013  FINDINGS: No acute intracranial abnormality. Specifically, no hemorrhage, hydrocephalus, mass  lesion, acute infarction, or significant intracranial injury. No acute calvarial abnormality.  Mucosal thickening in the paranasal sinuses. No air-fluid levels. Mastoid air cells are clear.  IMPRESSION: No acute intracranial abnormality.  Chronic sinusitis.   Electronically Signed   By: Rolm Baptise M.D.   On: 10/31/2014 15:30   Medications:  I have reviewed the patient's current medications Scheduled Meds: . acetaminophen  1,000 mg Oral Once  . amLODipine  5 mg Oral Daily  . cephALEXin  500 mg Oral 3 times per day  . diazepam  2 mg Oral BID  . heparin  5,000 Units Subcutaneous 3 times per day  . insulin aspart  0-20 Units Subcutaneous TID WC  . insulin aspart  0-5 Units Subcutaneous QHS  . insulin aspart  50 Units Subcutaneous TID WC  . levothyroxine  125 mcg Oral Daily  . lisinopril  40 mg Oral Daily  . pantoprazole  80 mg Oral Q1200  . PARoxetine  30 mg Oral Daily  . simvastatin  20 mg Oral QHS  . sodium chloride  3 mL Intravenous Q12H   Continuous Infusions: . sodium chloride 100 mL/hr at 11/01/14 2336   PRN Meds:.acetaminophen **OR** acetaminophen, carisoprodol, furosemide, hydrALAZINE, ondansetron **OR** ondansetron (ZOFRAN) IV, oxyCODONE   Assessment/Plan: #1. Vertigo. Continue diazepam. Neurology consultation pending. MRI of the brain pending. #2. Chest pain. EKG pending. Check troponins every 63. #3. Diabetes. She was hypoglycemic to 59 last night. Glucose this morning is 116. #4. Hypertension. Continue lisinopril and amlodipine. Principal Problem:   Headache, migraine, intractable Active Problems:   GERD   Headache   Syncope   Vertigo   Poorly controlled diabetes mellitus   Essential hypertension   HLD (hyperlipidemia)   Depression   GERD (gastroesophageal reflux disease)   Hypothyroid     LOS: 2 days   Marcena Dias 11/02/2014, 7:31 AM

## 2014-11-02 NOTE — Progress Notes (Signed)
Hypoglycemic Event  CBG: 59  Treatment: 15 GM carbohydrate snack  Symptoms: None  Follow-up CBG: Time:2257  CBG Result:116  Possible Reasons for Event:   Comments/MD notified: Pt given higher doses of insulin on day shift to control BS since pts normal insulin Humulin U 500 is unavailable.     Jaclyn Herrera  Remember to initiate Hypoglycemia Order Set & complete

## 2014-11-02 NOTE — Care Management Note (Signed)
Case Management Note  Patient Details  Name: Jaclyn Herrera MRN: [id number] Date of Birth: November 29, 1945  Subjective/Objective:                  Pt admitted from home with vertigo and possible seizure. Pt lives alone and will return home at discharge. Pt has a cane that she uses. Pt is fairly independent with ADL's.   Action/Plan: Anticipate discharge within 24 hours. Pt would like [place] RN and PT from [facility] at discharge. Romualdo Bolk of [facility] is aware and will collect the pts information from the chart. [place] services to start within 48 hours of discharge. Pt and pts nurse aware of discharge arrangements.  Expected Discharge Date:  11/02/14               Expected Discharge Plan:  [place]  In-House Referral:  NA  Discharge planning Services  CM Consult  Post Acute Care Choice:  Home Health Choice offered to:  Patient  DME Arranged:    DME Agency:     HH Arranged:  RN, PT [place] Agency:  [place]  Status of Service:  Completed, signed off  Medicare Important Message Given:    Date Medicare IM Given:    Medicare IM give by:    Date Additional Medicare IM Given:    Additional Medicare Important Message give by:     If discussed at [place] of Stay Meetings, dates discussed:    Additional Comments:  Joylene Draft, RN 11/02/2014, 1:55 PM

## 2014-11-02 NOTE — Procedures (Signed)
Jaclyn A. Merlene Laughter, MD     [email or web address]           HISTORY: The patient presents with altered mental status and confusion with an episode of unresponsiveness/syncope.   MEDICATIONS: Scheduled Meds: . acetaminophen  1,000 mg Oral Once  . amLODipine  5 mg Oral Daily  . cephALEXin  500 mg Oral 3 times per day  . diazepam  2 mg Oral BID  . heparin  5,000 Units Subcutaneous 3 times per day  . insulin aspart  0-20 Units Subcutaneous TID WC  . insulin aspart  0-5 Units Subcutaneous QHS  . insulin aspart  15 Units Subcutaneous TID WC  . insulin glargine  25 Units Subcutaneous BID  . levothyroxine  125 mcg Oral Daily  . lisinopril  40 mg Oral Daily  . pantoprazole  80 mg Oral Q1200  . PARoxetine  30 mg Oral Daily  . simvastatin  20 mg Oral QHS  . sodium chloride  3 mL Intravenous Q12H   Continuous Infusions: . sodium chloride 10 mL/hr at 11/02/14 0836   PRN Meds:.acetaminophen **OR** acetaminophen, alum & mag hydroxide-simeth, carisoprodol, furosemide, hydrALAZINE, ondansetron **OR** ondansetron (ZOFRAN) IV, oxyCODONE  Prior to Admission medications   Medication Sig Start Date End Date Taking? Authorizing Provider  acetaminophen (TYLENOL) 500 MG tablet Take 1,000 mg by mouth every 6 (six) hours as needed for headache.   Yes Historical Provider, MD  carisoprodol (SOMA) 350 MG tablet Take 175-350 mg by mouth daily as needed for muscle spasms. For cramps   Yes Historical Provider, MD  esomeprazole (NEXIUM) 40 MG capsule Take 40 mg by mouth daily before breakfast.     Yes Historical Provider, MD  furosemide (LASIX) 20 MG tablet Take 20 mg by mouth daily as needed for fluid.    Yes Historical Provider, MD  insulin regular human CONCENTRATED (HUMULIN R) 500 UNIT/ML SOLN injection Inject 50 Units into the skin 3 (three) times daily with meals. Pt states that her endocrinologist (Dr Forde Dandy) and his diabetes educator switched her to U-500 insulin and pt was  instructed to use a "standard" insulin syringe and pull dose up to 10 units (0.87ml) which is actually 50 UNITS per dose.  Pt does occasionally use more per a "written sliding scale" given to her but she did not have it during this admission.  Sliding scale range is reported 50-120 units based on blood sugars.  Pt states that she has done really well on the U-500 insulin.   Yes Historical Provider, MD  levothyroxine (SYNTHROID, LEVOTHROID) 125 MCG tablet Take 125 mcg by mouth daily.     Yes Historical Provider, MD  PARoxetine (PAXIL) 30 MG tablet Take 30 mg by mouth daily.   Yes Historical Provider, MD  Polyethylene Glycol 400 (BLINK TEARS OP) Place 2 drops into both eyes 3 (three) times daily.   Yes Historical Provider, MD  quinapril (ACCUPRIL) 40 MG tablet Take 1 tablet (40 mg total) by mouth daily. 05/30/13  Yes Burnell Blanks, MD  Senna-Psyllium (PERDIEM PO) Take 1 tablet by mouth daily as needed.    Yes Historical Provider, MD  simvastatin (ZOCOR) 20 MG tablet Take 20 mg by mouth at bedtime.     Yes Historical Provider, MD      ANALYSIS: A 16 channel recording using standard 10 20 measurements is conducted for 20 minutes. There is a well-formed posterior dominant rhythm of 10 Hz which attenuates with eye opening. There is beta activity observed in the  frontal areas. Awake and sleep activities are observed. K complexes and sleep spindles are observed. Photic stimulation and hyperventilation are not carried out. The patient is noted to have right occipital parietal slowing throughout the recording. No epileptiform activities are observed. The patient also has occasional frontal intermittent rhythmic delta activity.   IMPRESSION: 1. Right occipital parietal slowing which can be seen in focal cerebral disturbance or epileptic focus. 2. Frontal intermittent rhythmic delta activity which is most commonly seen in metabolic processes/disturbances.       Avishai Reihl A. Merlene Herrera, M.D.  Diplomate,  Tax adviser of Psychiatry and Neurology ( Neurology).

## 2014-11-02 NOTE — Progress Notes (Signed)
EEG Completed; Results Pending  

## 2014-11-03 ENCOUNTER — Inpatient Hospital Stay (HOSPITAL_COMMUNITY): Payer: Medicare Other

## 2014-11-03 LAB — GLUCOSE, CAPILLARY
Glucose-Capillary: 286 mg/dL — ABNORMAL HIGH (ref 65–99)
Glucose-Capillary: 310 mg/dL — ABNORMAL HIGH (ref 65–99)

## 2014-11-03 MED ORDER — CEPHALEXIN 500 MG PO CAPS: 500.0000 mg | ORAL_CAPSULE | Freq: Three times a day (TID) | ORAL | Status: DC

## 2014-11-03 MED ORDER — DIAZEPAM 2 MG PO TABS: 2.0000 mg | ORAL_TABLET | Freq: Two times a day (BID) | ORAL | Status: DC

## 2014-11-03 MED ORDER — AMLODIPINE BESYLATE 5 MG PO TABS
5.0000 mg | ORAL_TABLET | Freq: Every day | ORAL | Status: DC
Start: 2014-11-03 — End: 2015-04-20

## 2014-11-03 NOTE — Progress Notes (Signed)
Removed Pt from telemetry and removed IV, pt tolerated well.  Reviewed discharge instructions with pt and answered all questions.

## 2014-11-03 NOTE — Discharge Summary (Signed)
Physician Discharge Summary  Jaclyn Herrera [id number] DOB: 12/05/1945 DOA: 10/31/2014   Admit date: 10/31/2014 Discharge date: 11/03/2014  Discharge Diagnoses: #1. Syncope. #2. Vertigo. #3. Diabetes. #4. Hyperlipidemia. #5. Hypertension. #6. Parotitis. Principal Problem:   Headache, migraine, intractable Active Problems:   GERD   Headache   Syncope   Vertigo   Poorly controlled diabetes mellitus   Essential hypertension   HLD (hyperlipidemia)   Depression   GERD (gastroesophageal reflux disease)   Hypothyroid    Wt Readings from Last 3 Encounters:  10/31/14 225 lb (102.059 kg)  09/30/14 220 lb 7.4 oz (100 kg)  07/14/13 224 lb (101.606 kg)     Hospital Course:  This patient is a 69 year old female who presented to the emergency room after experiencing a syncopal episode at home. She had experienced vertigo and headache. A CT scan of the brain was negative. She was unable to ablate independently and was admitted. She was seen in consultation by neurology. She had an MRI of the brain which revealed no stroke or tumor. She had an EEG which revealed occipital parietal slowing.  Her vertigo was treated with diazepam. Diabetes therapy was modified to use Lantus and NovoLog in place of her U5 100. She experienced chest pain which was felt to likely be esophageal in nature. Troponins were negative 3. EKG revealed no sign of acute ischemia.  Treatment of her recent parotitis was modified to cephalexin.  She was hypertensive despite her ACE inhibitor therapy. Amlodipine 5 mg daily was added.  Her symptoms improved. She was stable for discharge on May 24. She'll follow-up in the office in one week.   Discharge Instructions     Medication List    TAKE these medications        acetaminophen 500 MG tablet  Commonly known as:  TYLENOL  Take 1,000 mg by mouth every 6 (six) hours as needed for headache.     amLODipine 5 MG tablet  Commonly known as:  NORVASC  Take 1  tablet (5 mg total) by mouth daily.     BLINK TEARS OP  Place 2 drops into both eyes 3 (three) times daily.     carisoprodol 350 MG tablet  Commonly known as:  SOMA  Take 175-350 mg by mouth daily as needed for muscle spasms. For cramps     cephALEXin 500 MG capsule  Commonly known as:  KEFLEX  Take 1 capsule (500 mg total) by mouth every 8 (eight) hours.     diazepam 2 MG tablet  Commonly known as:  VALIUM  Take 1 tablet (2 mg total) by mouth 2 (two) times daily.     esomeprazole 40 MG capsule  Commonly known as:  NEXIUM  Take 40 mg by mouth daily before breakfast.     furosemide 20 MG tablet  Commonly known as:  LASIX  Take 20 mg by mouth daily as needed for fluid.     HUMULIN R 500 UNIT/ML Soln injection  Generic drug:  insulin regular human CONCENTRATED  Inject 50 Units into the skin 3 (three) times daily with meals. Pt states that her endocrinologist (Dr Evlyn Kanner) and his diabetes educator switched her to U-500 insulin and pt was instructed to use a "standard" insulin syringe and pull dose up to 10 units (0.76ml) which is actually 50 UNITS per dose.  Pt does occasionally use more per a "written sliding scale" given to her but she did not have it during this admission.  Sliding scale range is  reported 50-120 units based on blood sugars.  Pt states that she has done really well on the U-500 insulin.     levothyroxine 125 MCG tablet  Commonly known as:  SYNTHROID, LEVOTHROID  Take 125 mcg by mouth daily.     PARoxetine 30 MG tablet  Commonly known as:  PAXIL  Take 30 mg by mouth daily.     PERDIEM PO  Take 1 tablet by mouth daily as needed.     quinapril 40 MG tablet  Commonly known as:  ACCUPRIL  Take 1 tablet (40 mg total) by mouth daily.     simvastatin 20 MG tablet  Commonly known as:  ZOCOR  Take 20 mg by mouth at bedtime.         [name] 11/03/2014

## 2014-11-03 NOTE — Care Management Note (Signed)
Case Management Note  Patient Details  Name: Jaclyn Herrera MRN: [id number] Date of Birth: 30-Sep-1945  Expected Discharge Date:  11/02/14               Expected Discharge Plan:  [place]  In-House Referral:  NA  Discharge planning Services  CM Consult  Post Acute Care Choice:  Home Health Choice offered to:  Patient  DME Arranged:    DME Agency:     HH Arranged:  RN, PT [place] Agency:  [place]  Status of Service:  Completed, signed off  Medicare Important Message Given:  Yes Date Medicare IM Given:  11/03/14 Medicare IM give by:  Jolene Provost, RN, MSN, CM  Date Additional Medicare IM Given:    Additional Medicare Important Message give by:     If discussed at [place] of Stay Meetings, dates discussed:    Additional Comments: Pt being discharged home with [facility] services today. No further CM needs.   Sherald Barge, RN 11/03/2014, 8:05 AM

## 2014-11-03 NOTE — Progress Notes (Signed)
Patient ID: Jaclyn Herrera, female   DOB: Mar 11, 1946, 69 y.o.   MRN: [id number]   [facility] NEUROLOGY Bomani Oommen A. Gerilyn Pilgrim, MD     [email or web address]          Jaclyn Herrera is an 69 y.o. female.   Assessment/Plan: Episode of unresponsiveness of unclear etiology. The presentation is suspicious for unwitnessed seizure. Although concerns includes fall with concussive syndrome and cardiac causes.  Headaches.  RECOMMENDATION: Carotid duplex Doppler can be done as outpatient.  The patient continues to have headaches. Headache is actually much better and is mostly located on the left side where she hit her head. She wants to have her carotid arteries checked that she thinks that she has reduced blood circulation.That's way she describes her headaches.    GENERAL: This is a very pleasant female in no acute distress.  HEENT: Supple. Atraumatic normocephalic. Mild soreness left frontal region.  ABDOMEN: soft  EXTREMITIES: [place] Client of the distal right leg. 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   BACK: Normal.  SKIN: Normal by inspection.   MENTAL STATUS: Alert and oriented. Speech, language and cognition are generally intact. Judgment and insight normal.   CRANIAL NERVES: Pupils are equal, round and reactive to light and accommodation; extra ocular movements are full, there is no significant nystagmus; visual fields are full; upper and lower facial muscles are normal in strength and symmetric, there is no flattening of the nasolabial folds; tongue is midline; uvula is midline; shoulder elevation is normal.  MOTOR: Normal tone, bulk and strength; no pronator drift.  COORDINATION: Left finger to nose is normal, right finger to nose is normal, No rest tremor; no intention tremor; no postural tremor; no bradykinesia.  REFLEXES: Deep tendon reflexes are symmetrical and normal except for the right knee words  absent. Babinski reflexes are flexor bilaterally.   SENSATION: Normal to light touch.  EEG: 1. Right occipital parietal slowing which can be seen in focal cerebral disturbance or epileptic focus. 2. Frontal intermittent rhythmic delta activity which is most commonly seen in metabolic processes/disturbances.  The brain MRI is reviewed in person. Nothing acute seen on diffusion imaging. There is mild to moderate global atrophy. There is minimal deep white matter and periventricular leukoencephalopathy commensurate for patient's age. Ventricular and parenchymal systems are otherwise unremarkable.  Objective: Vital signs in last 24 hours: Temp:  [98.4 F (36.9 C)-98.5 F (36.9 C)] 98.5 F (36.9 C) (05/24 6962) Pulse Rate:  [82-87] 87 (05/24 0614) Resp:  [18] 18 (05/24 0614) BP: (175-186)/(66-75) 186/75 mmHg (05/24 0614) SpO2:  [96 %-100 %] 96 % (05/24 0614)  Intake/Output from previous day: 05/23 0701 - 05/24 0700 In: 243 [P.O.:240; I.V.:3] Out: 1000 [Urine:1000] Intake/Output this shift:   Nutritional status: Diet Carb Modified Fluid consistency:: Thin; Room service appropriate?: Yes   Lab Results: Results for orders placed or performed during the hospital encounter of 10/31/14 (from the past 48 hour(s))  Glucose, capillary     Status: Abnormal   Collection Time: 11/01/14 11:57 AM  Result Value Ref Range   Glucose-Capillary 462 (H) 65 - 99 mg/dL   Comment 1 Notify RN    Comment 2 Document in Chart   Glucose, capillary     Status: Abnormal   Collection Time: 11/01/14  2:34 PM  Result Value Ref Range   Glucose-Capillary 386 (H) 65 - 99 mg/dL   Comment 1 Notify RN    Comment 2 Document in Chart  Glucose, capillary     Status: Abnormal   Collection Time: 11/01/14  4:42 PM  Result Value Ref Range   Glucose-Capillary 260 (H) 65 - 99 mg/dL   Comment 1 Notify RN    Comment 2 Document in Chart   Glucose, capillary     Status: Abnormal   Collection Time: 11/01/14  9:24 PM    Result Value Ref Range   Glucose-Capillary 59 (L) 65 - 99 mg/dL  Glucose, capillary     Status: Abnormal   Collection Time: 11/01/14 10:57 PM  Result Value Ref Range   Glucose-Capillary 116 (H) 65 - 99 mg/dL  Troponin I (q 6hr x 3)     Status: None   Collection Time: 11/02/14  7:59 AM  Result Value Ref Range   Troponin I <0.03 <0.031 ng/mL    Comment:        NO INDICATION OF MYOCARDIAL INJURY.   Glucose, capillary     Status: Abnormal   Collection Time: 11/02/14  7:59 AM  Result Value Ref Range   Glucose-Capillary 298 (H) 65 - 99 mg/dL  Glucose, capillary     Status: Abnormal   Collection Time: 11/02/14 12:16 PM  Result Value Ref Range   Glucose-Capillary 176 (H) 65 - 99 mg/dL  Troponin I (q 6hr x 3)     Status: None   Collection Time: 11/02/14  3:03 PM  Result Value Ref Range   Troponin I 0.03 <0.031 ng/mL    Comment:        NO INDICATION OF MYOCARDIAL INJURY.   Glucose, capillary     Status: Abnormal   Collection Time: 11/02/14  5:04 PM  Result Value Ref Range   Glucose-Capillary 130 (H) 65 - 99 mg/dL  Troponin I (q 6hr x 3)     Status: None   Collection Time: 11/02/14  8:06 PM  Result Value Ref Range   Troponin I <0.03 <0.031 ng/mL    Comment:        NO INDICATION OF MYOCARDIAL INJURY.   Glucose, capillary     Status: Abnormal   Collection Time: 11/02/14  8:33 PM  Result Value Ref Range   Glucose-Capillary 155 (H) 65 - 99 mg/dL  Glucose, capillary     Status: Abnormal   Collection Time: 11/03/14  7:28 AM  Result Value Ref Range   Glucose-Capillary 286 (H) 65 - 99 mg/dL    Lipid Panel No results for input(s): CHOL, TRIG, HDL, CHOLHDL, VLDL, LDLCALC in the last 72 hours.  Studies/Results:   Medications:  Scheduled Meds: . acetaminophen  1,000 mg Oral Once  . amLODipine  5 mg Oral Daily  . cephALEXin  500 mg Oral 3 times per day  . diazepam  2 mg Oral BID  . heparin  5,000 Units Subcutaneous 3 times per day  . insulin aspart  0-20 Units Subcutaneous  TID WC  . insulin aspart  0-5 Units Subcutaneous QHS  . insulin aspart  15 Units Subcutaneous TID WC  . insulin glargine  25 Units Subcutaneous BID  . levothyroxine  125 mcg Oral Daily  . lisinopril  40 mg Oral Daily  . pantoprazole  80 mg Oral Q1200  . PARoxetine  30 mg Oral Daily  . simvastatin  20 mg Oral QHS  . sodium chloride  3 mL Intravenous Q12H   Continuous Infusions: . sodium chloride 10 mL/hr at 11/02/14 2141   PRN Meds:.acetaminophen **OR** acetaminophen, alum & mag hydroxide-simeth, carisoprodol, furosemide, hydrALAZINE, ondansetron **OR**  ondansetron (ZOFRAN) IV, oxyCODONE     LOS: 3 days   Gregory Barrick A. Gerilyn Pilgrim, M.D.  Diplomate, Biomedical engineer of Psychiatry and Neurology ( Neurology).

## 2014-11-24 ENCOUNTER — Other Ambulatory Visit (INDEPENDENT_AMBULATORY_CARE_PROVIDER_SITE_OTHER): Payer: Self-pay | Admitting: Otolaryngology

## 2014-11-24 DIAGNOSIS — E041 Nontoxic single thyroid nodule: Secondary | ICD-10-CM

## 2014-11-30 ENCOUNTER — Ambulatory Visit (HOSPITAL_COMMUNITY)
Admission: RE | Admit: 2014-11-30 | Discharge: 2014-11-30 | Disposition: A | Discharge status: Home / Self Care | Payer: Medicare Other | Source: Ambulatory Visit | Attending: Otolaryngology | Admitting: Otolaryngology

## 2014-11-30 DIAGNOSIS — E041 Nontoxic single thyroid nodule: Secondary | ICD-10-CM | POA: Diagnosis not present

## 2014-12-24 ENCOUNTER — Ambulatory Visit (INDEPENDENT_AMBULATORY_CARE_PROVIDER_SITE_OTHER): Payer: Medicare Other | Admitting: Otolaryngology

## 2014-12-24 DIAGNOSIS — D440 Neoplasm of uncertain behavior of thyroid gland: Secondary | ICD-10-CM

## 2014-12-24 DIAGNOSIS — H608X3 Other otitis externa, bilateral: Secondary | ICD-10-CM

## 2014-12-24 DIAGNOSIS — K115 Sialolithiasis: Secondary | ICD-10-CM

## 2014-12-29 ENCOUNTER — Emergency Department (HOSPITAL_COMMUNITY): Payer: Medicare Other

## 2014-12-29 ENCOUNTER — Emergency Department (HOSPITAL_COMMUNITY): Payer: Medicare Other | Admitting: Anesthesiology

## 2014-12-29 ENCOUNTER — Encounter (HOSPITAL_COMMUNITY)
Admission: EM | Disposition: A | Discharge status: Skilled Nursing Facility | Payer: Self-pay | Source: Home / Self Care | Attending: Orthopedic Surgery

## 2014-12-29 ENCOUNTER — Encounter (HOSPITAL_COMMUNITY): Payer: Self-pay | Admitting: Emergency Medicine

## 2014-12-29 ENCOUNTER — Inpatient Hospital Stay (HOSPITAL_COMMUNITY)
Admission: EM | Admit: 2014-12-29 | Discharge: 2015-01-01 | DRG: 505 | Disposition: A | Discharge status: Skilled Nursing Facility | Payer: Medicare Other | Attending: Orthopedic Surgery | Admitting: Orthopedic Surgery

## 2014-12-29 DIAGNOSIS — R05 Cough: Secondary | ICD-10-CM

## 2014-12-29 DIAGNOSIS — Z9109 Other allergy status, other than to drugs and biological substances: Secondary | ICD-10-CM | POA: Diagnosis not present

## 2014-12-29 DIAGNOSIS — H540 Blindness, both eyes: Secondary | ICD-10-CM | POA: Diagnosis present

## 2014-12-29 DIAGNOSIS — Z888 Allergy status to other drugs, medicaments and biological substances status: Secondary | ICD-10-CM

## 2014-12-29 DIAGNOSIS — W010XXA Fall on same level from slipping, tripping and stumbling without subsequent striking against object, initial encounter: Secondary | ICD-10-CM | POA: Diagnosis present

## 2014-12-29 DIAGNOSIS — E039 Hypothyroidism, unspecified: Secondary | ICD-10-CM | POA: Diagnosis present

## 2014-12-29 DIAGNOSIS — M1990 Unspecified osteoarthritis, unspecified site: Secondary | ICD-10-CM | POA: Diagnosis present

## 2014-12-29 DIAGNOSIS — S92033A Displaced avulsion fracture of tuberosity of unspecified calcaneus, initial encounter for closed fracture: Secondary | ICD-10-CM | POA: Diagnosis present

## 2014-12-29 DIAGNOSIS — Z823 Family history of stroke: Secondary | ICD-10-CM

## 2014-12-29 DIAGNOSIS — F329 Major depressive disorder, single episode, unspecified: Secondary | ICD-10-CM

## 2014-12-29 DIAGNOSIS — Y92019 Unspecified place in single-family (private) house as the place of occurrence of the external cause: Secondary | ICD-10-CM | POA: Diagnosis not present

## 2014-12-29 DIAGNOSIS — Z882 Allergy status to sulfonamides status: Secondary | ICD-10-CM | POA: Diagnosis not present

## 2014-12-29 DIAGNOSIS — Z833 Family history of diabetes mellitus: Secondary | ICD-10-CM | POA: Diagnosis not present

## 2014-12-29 DIAGNOSIS — Z794 Long term (current) use of insulin: Secondary | ICD-10-CM | POA: Diagnosis not present

## 2014-12-29 DIAGNOSIS — Z881 Allergy status to other antibiotic agents status: Secondary | ICD-10-CM

## 2014-12-29 DIAGNOSIS — Z8249 Family history of ischemic heart disease and other diseases of the circulatory system: Secondary | ICD-10-CM | POA: Diagnosis not present

## 2014-12-29 DIAGNOSIS — Z79899 Other long term (current) drug therapy: Secondary | ICD-10-CM | POA: Diagnosis not present

## 2014-12-29 DIAGNOSIS — I10 Essential (primary) hypertension: Secondary | ICD-10-CM | POA: Diagnosis not present

## 2014-12-29 DIAGNOSIS — Z79891 Long term (current) use of opiate analgesic: Secondary | ICD-10-CM | POA: Diagnosis not present

## 2014-12-29 DIAGNOSIS — E119 Type 2 diabetes mellitus without complications: Secondary | ICD-10-CM | POA: Diagnosis not present

## 2014-12-29 DIAGNOSIS — T148XXA Other injury of unspecified body region, initial encounter: Secondary | ICD-10-CM

## 2014-12-29 DIAGNOSIS — S92002A Unspecified fracture of left calcaneus, initial encounter for closed fracture: Principal | ICD-10-CM

## 2014-12-29 DIAGNOSIS — Z886 Allergy status to analgesic agent status: Secondary | ICD-10-CM | POA: Diagnosis not present

## 2014-12-29 DIAGNOSIS — E785 Hyperlipidemia, unspecified: Secondary | ICD-10-CM | POA: Diagnosis present

## 2014-12-29 DIAGNOSIS — F32A Depression, unspecified: Secondary | ICD-10-CM | POA: Diagnosis present

## 2014-12-29 DIAGNOSIS — K219 Gastro-esophageal reflux disease without esophagitis: Secondary | ICD-10-CM | POA: Diagnosis present

## 2014-12-29 DIAGNOSIS — S92009A Unspecified fracture of unspecified calcaneus, initial encounter for closed fracture: Secondary | ICD-10-CM | POA: Diagnosis not present

## 2014-12-29 DIAGNOSIS — E1159 Type 2 diabetes mellitus with other circulatory complications: Secondary | ICD-10-CM | POA: Diagnosis present

## 2014-12-29 DIAGNOSIS — F319 Bipolar disorder, unspecified: Secondary | ICD-10-CM | POA: Diagnosis present

## 2014-12-29 DIAGNOSIS — R058 Other specified cough: Secondary | ICD-10-CM

## 2014-12-29 DIAGNOSIS — Z825 Family history of asthma and other chronic lower respiratory diseases: Secondary | ICD-10-CM

## 2014-12-29 DIAGNOSIS — M25572 Pain in left ankle and joints of left foot: Secondary | ICD-10-CM | POA: Diagnosis not present

## 2014-12-29 HISTORY — PX: ORIF CALCANEOUS FRACTURE: SHX5030

## 2014-12-29 HISTORY — DX: Other specified postprocedural states: Z98.890

## 2014-12-29 HISTORY — DX: Other specified postprocedural states: R11.2

## 2014-12-29 LAB — COMPREHENSIVE METABOLIC PANEL
ALT: 15 U/L (ref 14–54)
AST: 14 U/L — ABNORMAL LOW (ref 15–41)
Albumin: 4.3 g/dL (ref 3.5–5.0)
Alkaline Phosphatase: 134 U/L — ABNORMAL HIGH (ref 38–126)
Anion gap: 7 (ref 5–15)
BUN: 20 mg/dL (ref 6–20)
CALCIUM: 8.7 mg/dL — AB (ref 8.9–10.3)
CO2: 22 mmol/L (ref 22–32)
Chloride: 105 mmol/L (ref 101–111)
Creatinine, Ser: 0.79 mg/dL (ref 0.44–1.00)
GFR calc Af Amer: >60 mL/min (ref >60)
Glucose, Bld: 515 mg/dL — ABNORMAL HIGH (ref 65–99)
Potassium: 5.2 mmol/L — ABNORMAL HIGH (ref 3.5–5.1)
SODIUM: 134 mmol/L — AB (ref 135–145)
Total Bilirubin: 2.1 mg/dL — ABNORMAL HIGH (ref 0.3–1.2)
Total Protein: 7.2 g/dL (ref 6.5–8.1)

## 2014-12-29 LAB — CBC
HCT: 33.3 % — ABNORMAL LOW (ref 36.0–46.0)
HCT: 33.7 % — ABNORMAL LOW (ref 36.0–46.0)
HEMOGLOBIN: 11.2 g/dL — AB (ref 12.0–15.0)
Hemoglobin: 11 g/dL — ABNORMAL LOW (ref 12.0–15.0)
MCH: 27 pg (ref 26.0–34.0)
MCH: 27.6 pg (ref 26.0–34.0)
MCHC: 33 g/dL (ref 30.0–36.0)
MCHC: 33.2 g/dL (ref 30.0–36.0)
MCV: 81.6 fL (ref 78.0–100.0)
MCV: 83 fL (ref 78.0–100.0)
Platelets: 144 10*3/uL — ABNORMAL LOW (ref 150–400)
Platelets: 151 10*3/uL (ref 150–400)
RBC: 4.08 MIL/uL (ref 3.87–5.11)
RBC: 4.06 MIL/uL (ref 3.87–5.11)
RDW: 15.2 % (ref 11.5–15.5)
RDW: 15.4 % (ref 11.5–15.5)
WBC: 7.2 10*3/uL (ref 4.0–10.5)
WBC: 7.3 10*3/uL (ref 4.0–10.5)

## 2014-12-29 LAB — URINALYSIS, ROUTINE W REFLEX MICROSCOPIC
BILIRUBIN URINE: NEGATIVE
Glucose, UA: >1000 mg/dL — AB
Ketones, ur: >80 mg/dL — AB
Leukocytes, UA: NEGATIVE
Nitrite: NEGATIVE
PROTEIN: NEGATIVE mg/dL
Specific Gravity, Urine: 1.025 (ref 1.005–1.030)
UROBILINOGEN UA: 0.2 mg/dL (ref 0.0–1.0)
pH: 5.5 (ref 5.0–8.0)

## 2014-12-29 LAB — CBG MONITORING, ED: GLUCOSE-CAPILLARY: 450 mg/dL — AB (ref 65–99)

## 2014-12-29 LAB — GLUCOSE, CAPILLARY
GLUCOSE-CAPILLARY: 386 mg/dL — AB (ref 65–99)
GLUCOSE-CAPILLARY: 254 mg/dL — AB (ref 65–99)
Glucose-Capillary: 398 mg/dL — ABNORMAL HIGH (ref 65–99)

## 2014-12-29 LAB — CREATININE, SERUM
CREATININE: 0.84 mg/dL (ref 0.44–1.00)
GFR calc Af Amer: >60 mL/min (ref >60)
GFR calc non Af Amer: >60 mL/min (ref >60)

## 2014-12-29 LAB — URINE MICROSCOPIC-ADD ON

## 2014-12-29 LAB — PROTIME-INR
INR: 1.1 (ref 0.00–1.49)
PROTHROMBIN TIME: 14.4 s (ref 11.6–15.2)

## 2014-12-29 SURGERY — OPEN REDUCTION INTERNAL FIXATION (ORIF) CALCANEOUS FRACTURE
Anesthesia: General | Site: Foot | Laterality: Left

## 2014-12-29 MED ORDER — LEVOTHYROXINE SODIUM 125 MCG PO TABS
125.0000 ug | ORAL_TABLET | ORAL | Status: DC
  Administered 2014-12-30 – 2015-01-01 (×3): 125 ug via ORAL
  Filled 2014-12-29 (×4): qty 1

## 2014-12-29 MED ORDER — ALBUTEROL SULFATE (2.5 MG/3ML) 0.083% IN NEBU: 2.5000 mg | INHALATION_SOLUTION | RESPIRATORY_TRACT | Status: DC | PRN

## 2014-12-29 MED ORDER — PROPOFOL 10 MG/ML IV BOLUS
INTRAVENOUS | Status: AC
  Filled 2014-12-29: qty 20

## 2014-12-29 MED ORDER — ONDANSETRON HCL 4 MG/2ML IJ SOLN: 4.0000 mg | Freq: Four times a day (QID) | INTRAMUSCULAR | Status: DC | PRN

## 2014-12-29 MED ORDER — PANTOPRAZOLE SODIUM 40 MG PO TBEC
80.0000 mg | DELAYED_RELEASE_TABLET | Freq: Every day | ORAL | Status: DC
  Administered 2014-12-30 – 2015-01-01 (×3): 80 mg via ORAL
  Filled 2014-12-29 (×3): qty 2

## 2014-12-29 MED ORDER — FENTANYL CITRATE (PF) 250 MCG/5ML IJ SOLN
INTRAMUSCULAR | Status: DC | PRN
  Administered 2014-12-29: 100 ug via INTRAVENOUS
  Administered 2014-12-29 (×2): 50 ug via INTRAVENOUS

## 2014-12-29 MED ORDER — INSULIN ASPART 100 UNIT/ML ~~LOC~~ SOLN
SUBCUTANEOUS | Status: DC | PRN
  Administered 2014-12-29: 10 [IU] via SUBCUTANEOUS
  Administered 2014-12-29: 5 [IU] via SUBCUTANEOUS

## 2014-12-29 MED ORDER — FLUTICASONE PROPIONATE 50 MCG/ACT NA SUSP
2.0000 | Freq: Every day | NASAL | Status: DC
  Administered 2014-12-29 – 2015-01-01 (×4): 2 via NASAL
  Filled 2014-12-29: qty 16

## 2014-12-29 MED ORDER — LACTATED RINGERS IV SOLN
INTRAVENOUS | Status: DC | PRN
  Administered 2014-12-29: 18:00:00 via INTRAVENOUS

## 2014-12-29 MED ORDER — FLEET ENEMA 7-19 GM/118ML RE ENEM: 1.0000 | ENEMA | Freq: Once | RECTAL | Status: AC | PRN

## 2014-12-29 MED ORDER — ACETAMINOPHEN 500 MG PO TABS
1000.0000 mg | ORAL_TABLET | Freq: Four times a day (QID) | ORAL | Status: AC
  Administered 2014-12-30 (×4): 1000 mg via ORAL
  Filled 2014-12-29 (×4): qty 2

## 2014-12-29 MED ORDER — MORPHINE SULFATE 2 MG/ML IJ SOLN: 1.0000 mg | INTRAMUSCULAR | Status: DC | PRN

## 2014-12-29 MED ORDER — METHOCARBAMOL 500 MG PO TABS
500.0000 mg | ORAL_TABLET | Freq: Four times a day (QID) | ORAL | Status: DC | PRN
  Administered 2014-12-30 – 2014-12-31 (×4): 500 mg via ORAL
  Filled 2014-12-29 (×5): qty 1

## 2014-12-29 MED ORDER — MORPHINE SULFATE 2 MG/ML IJ SOLN
1.0000 mg | INTRAMUSCULAR | Status: DC | PRN
  Administered 2014-12-29: 2 mg via INTRAVENOUS
  Filled 2014-12-29: qty 1

## 2014-12-29 MED ORDER — SUCCINYLCHOLINE CHLORIDE 20 MG/ML IJ SOLN
INTRAMUSCULAR | Status: DC | PRN
  Administered 2014-12-29: 100 mg via INTRAVENOUS

## 2014-12-29 MED ORDER — METHOCARBAMOL 1000 MG/10ML IJ SOLN
500.0000 mg | Freq: Four times a day (QID) | INTRAVENOUS | Status: DC | PRN
  Administered 2014-12-29: 500 mg via INTRAVENOUS
  Filled 2014-12-29 (×4): qty 5

## 2014-12-29 MED ORDER — ONDANSETRON HCL 4 MG PO TABS: 4.0000 mg | ORAL_TABLET | Freq: Four times a day (QID) | ORAL | Status: DC | PRN

## 2014-12-29 MED ORDER — ENOXAPARIN SODIUM 40 MG/0.4ML ~~LOC~~ SOLN
40.0000 mg | SUBCUTANEOUS | Status: DC
  Administered 2014-12-30 – 2015-01-01 (×3): 40 mg via SUBCUTANEOUS
  Filled 2014-12-29 (×4): qty 0.4

## 2014-12-29 MED ORDER — METOCLOPRAMIDE HCL 10 MG PO TABS: 5.0000 mg | ORAL_TABLET | Freq: Three times a day (TID) | ORAL | Status: DC | PRN

## 2014-12-29 MED ORDER — FENTANYL CITRATE (PF) 250 MCG/5ML IJ SOLN
INTRAMUSCULAR | Status: AC
  Filled 2014-12-29: qty 5

## 2014-12-29 MED ORDER — METOCLOPRAMIDE HCL 5 MG/ML IJ SOLN: 5.0000 mg | Freq: Three times a day (TID) | INTRAMUSCULAR | Status: DC | PRN

## 2014-12-29 MED ORDER — ROCURONIUM BROMIDE 100 MG/10ML IV SOLN
INTRAVENOUS | Status: DC | PRN
  Administered 2014-12-29: 10 mg via INTRAVENOUS

## 2014-12-29 MED ORDER — GLYCOPYRROLATE 0.2 MG/ML IJ SOLN
INTRAMUSCULAR | Status: DC | PRN
  Administered 2014-12-29: 0.4 mg via INTRAVENOUS

## 2014-12-29 MED ORDER — INSULIN ASPART 100 UNIT/ML ~~LOC~~ SOLN
SUBCUTANEOUS | Status: AC
Start: 2014-12-29 — End: 2014-12-29
  Filled 2014-12-29: qty 1

## 2014-12-29 MED ORDER — FUROSEMIDE 20 MG PO TABS
20.0000 mg | ORAL_TABLET | Freq: Two times a day (BID) | ORAL | Status: DC | PRN
  Administered 2014-12-31: 20 mg via ORAL
  Filled 2014-12-29 (×3): qty 1

## 2014-12-29 MED ORDER — FENTANYL CITRATE (PF) 100 MCG/2ML IJ SOLN
INTRAMUSCULAR | Status: AC
  Filled 2014-12-29: qty 2

## 2014-12-29 MED ORDER — PROPOFOL 10 MG/ML IV BOLUS
INTRAVENOUS | Status: DC | PRN
  Administered 2014-12-29: 50 mg via INTRAVENOUS
  Administered 2014-12-29: 100 mg via INTRAVENOUS
  Administered 2014-12-29: 150 mg via INTRAVENOUS

## 2014-12-29 MED ORDER — AMOXICILLIN-POT CLAVULANATE 875-125 MG PO TABS
1.0000 | ORAL_TABLET | Freq: Two times a day (BID) | ORAL | Status: DC
  Administered 2014-12-30 – 2015-01-01 (×4): 1 via ORAL
  Filled 2014-12-29 (×5): qty 1

## 2014-12-29 MED ORDER — LEVOTHYROXINE SODIUM 125 MCG PO TABS: 187.5000 ug | ORAL_TABLET | ORAL | Status: DC

## 2014-12-29 MED ORDER — OXYCODONE HCL 5 MG PO TABS
5.0000 mg | ORAL_TABLET | ORAL | Status: DC | PRN
  Administered 2014-12-29: 5 mg via ORAL
  Administered 2014-12-30 (×3): 10 mg via ORAL
  Administered 2014-12-30: 5 mg via ORAL
  Administered 2014-12-30: 10 mg via ORAL
  Administered 2014-12-31: 5 mg via ORAL
  Administered 2014-12-31: 10 mg via ORAL
  Administered 2014-12-31 – 2015-01-01 (×3): 5 mg via ORAL
  Filled 2014-12-29: qty 1
  Filled 2014-12-29 (×2): qty 2
  Filled 2014-12-29 (×2): qty 1
  Filled 2014-12-29 (×2): qty 2
  Filled 2014-12-29 (×3): qty 1
  Filled 2014-12-29: qty 2

## 2014-12-29 MED ORDER — DEXAMETHASONE SODIUM PHOSPHATE 10 MG/ML IJ SOLN
INTRAMUSCULAR | Status: AC
  Filled 2014-12-29: qty 1

## 2014-12-29 MED ORDER — POLYETHYLENE GLYCOL 3350 17 G PO PACK: 17.0000 g | PACK | Freq: Every day | ORAL | Status: DC | PRN

## 2014-12-29 MED ORDER — CEFAZOLIN SODIUM-DEXTROSE 2-3 GM-% IV SOLR
INTRAVENOUS | Status: AC
  Filled 2014-12-29: qty 50

## 2014-12-29 MED ORDER — BUPIVACAINE HCL (PF) 0.25 % IJ SOLN
INTRAMUSCULAR | Status: AC
  Filled 2014-12-29: qty 30

## 2014-12-29 MED ORDER — CISATRACURIUM BESYLATE 20 MG/10ML IV SOLN
INTRAVENOUS | Status: AC
  Filled 2014-12-29: qty 10

## 2014-12-29 MED ORDER — DOCUSATE SODIUM 100 MG PO CAPS
100.0000 mg | ORAL_CAPSULE | Freq: Two times a day (BID) | ORAL | Status: DC
  Administered 2014-12-29 – 2015-01-01 (×6): 100 mg via ORAL

## 2014-12-29 MED ORDER — LORATADINE 10 MG PO TABS
10.0000 mg | ORAL_TABLET | Freq: Every day | ORAL | Status: DC
Start: 2014-12-30 — End: 2015-01-01
  Administered 2014-12-31 – 2015-01-01 (×2): 10 mg via ORAL
  Filled 2014-12-29 (×3): qty 1

## 2014-12-29 MED ORDER — MECLIZINE HCL 25 MG PO TABS
25.0000 mg | ORAL_TABLET | Freq: Two times a day (BID) | ORAL | Status: DC
  Administered 2014-12-30 – 2015-01-01 (×5): 25 mg via ORAL
  Filled 2014-12-29 (×7): qty 1

## 2014-12-29 MED ORDER — ROCURONIUM BROMIDE 100 MG/10ML IV SOLN
INTRAVENOUS | Status: AC
  Filled 2014-12-29: qty 1

## 2014-12-29 MED ORDER — ACETAMINOPHEN 650 MG RE SUPP: 650.0000 mg | Freq: Four times a day (QID) | RECTAL | Status: DC | PRN

## 2014-12-29 MED ORDER — NEOSTIGMINE METHYLSULFATE 10 MG/10ML IV SOLN
INTRAVENOUS | Status: DC | PRN
  Administered 2014-12-29: 2.5 mg via INTRAVENOUS

## 2014-12-29 MED ORDER — BUPIVACAINE HCL 0.25 % IJ SOLN
INTRAMUSCULAR | Status: DC | PRN
  Administered 2014-12-29: 10 mL

## 2014-12-29 MED ORDER — MORPHINE SULFATE 4 MG/ML IJ SOLN
4.0000 mg | Freq: Once | INTRAMUSCULAR | Status: AC
  Administered 2014-12-29: 4 mg via INTRAVENOUS
  Filled 2014-12-29: qty 1

## 2014-12-29 MED ORDER — AMLODIPINE BESYLATE 5 MG PO TABS
5.0000 mg | ORAL_TABLET | Freq: Every day | ORAL | Status: DC
  Administered 2014-12-30 – 2015-01-01 (×3): 5 mg via ORAL
  Filled 2014-12-29 (×3): qty 1

## 2014-12-29 MED ORDER — INSULIN ASPART 100 UNIT/ML ~~LOC~~ SOLN
0.0000 [IU] | SUBCUTANEOUS | Status: DC
  Administered 2014-12-29: 8 [IU] via SUBCUTANEOUS
  Administered 2014-12-30: 11 [IU] via SUBCUTANEOUS
  Administered 2014-12-30: 3 [IU] via SUBCUTANEOUS
  Administered 2014-12-30: 15 [IU] via SUBCUTANEOUS
  Administered 2014-12-30: 8 [IU] via SUBCUTANEOUS
  Administered 2014-12-30: 5 [IU] via SUBCUTANEOUS
  Administered 2014-12-31: 11 [IU] via SUBCUTANEOUS
  Administered 2014-12-31 (×2): 2 [IU] via SUBCUTANEOUS
  Administered 2015-01-01 (×2): 11 [IU] via SUBCUTANEOUS
  Administered 2015-01-01: 8 [IU] via SUBCUTANEOUS
  Administered 2015-01-01: 15 [IU] via SUBCUTANEOUS

## 2014-12-29 MED ORDER — SIMVASTATIN 20 MG PO TABS
20.0000 mg | ORAL_TABLET | Freq: Every day | ORAL | Status: DC
  Administered 2014-12-29 – 2014-12-31 (×3): 20 mg via ORAL
  Filled 2014-12-29 (×4): qty 1

## 2014-12-29 MED ORDER — ACETAMINOPHEN 325 MG PO TABS: 650.0000 mg | ORAL_TABLET | Freq: Four times a day (QID) | ORAL | Status: DC | PRN

## 2014-12-29 MED ORDER — PAROXETINE HCL 30 MG PO TABS
30.0000 mg | ORAL_TABLET | Freq: Every day | ORAL | Status: DC
  Administered 2014-12-30 – 2015-01-01 (×3): 30 mg via ORAL
  Filled 2014-12-29 (×3): qty 1

## 2014-12-29 MED ORDER — ALBUTEROL SULFATE HFA 108 (90 BASE) MCG/ACT IN AERS: 2.0000 | INHALATION_SPRAY | RESPIRATORY_TRACT | Status: DC | PRN

## 2014-12-29 MED ORDER — BISACODYL 10 MG RE SUPP: 10.0000 mg | Freq: Every day | RECTAL | Status: DC | PRN

## 2014-12-29 MED ORDER — ONDANSETRON HCL 4 MG/2ML IJ SOLN
INTRAMUSCULAR | Status: AC
  Filled 2014-12-29: qty 2

## 2014-12-29 MED ORDER — FENTANYL CITRATE (PF) 100 MCG/2ML IJ SOLN
25.0000 ug | INTRAMUSCULAR | Status: DC | PRN
  Administered 2014-12-29: 50 ug via INTRAVENOUS
  Administered 2014-12-29 (×2): 25 ug via INTRAVENOUS

## 2014-12-29 MED ORDER — CEFAZOLIN SODIUM-DEXTROSE 2-3 GM-% IV SOLR
2.0000 g | Freq: Four times a day (QID) | INTRAVENOUS | Status: AC
  Administered 2014-12-30 (×3): 2 g via INTRAVENOUS
  Filled 2014-12-29 (×3): qty 50

## 2014-12-29 MED ORDER — LEVOTHYROXINE SODIUM 125 MCG PO TABS: 125.0000 ug | ORAL_TABLET | Freq: Every day | ORAL | Status: DC

## 2014-12-29 MED ORDER — SODIUM CHLORIDE 0.9 % IV SOLN
INTRAVENOUS | Status: DC
  Administered 2014-12-29: 22:00:00 via INTRAVENOUS

## 2014-12-29 MED ORDER — CEFAZOLIN SODIUM-DEXTROSE 2-3 GM-% IV SOLR
2.0000 g | Freq: Once | INTRAVENOUS | Status: AC
  Administered 2014-12-29: 2 g via INTRAVENOUS
  Filled 2014-12-29: qty 50

## 2014-12-29 MED ORDER — ONDANSETRON HCL 4 MG/2ML IJ SOLN
INTRAMUSCULAR | Status: DC | PRN
  Administered 2014-12-29: 4 mg via INTRAVENOUS

## 2014-12-29 SURGICAL SUPPLY — 54 items
BAG ZIPLOCK 12X15 (MISCELLANEOUS) ×2 IMPLANT
BANDAGE ELASTIC 4 VELCRO ST LF (GAUZE/BANDAGES/DRESSINGS) ×2 IMPLANT
BANDAGE ELASTIC 6 VELCRO ST LF (GAUZE/BANDAGES/DRESSINGS) ×2 IMPLANT
BANDAGE ESMARK 6X9 LF (GAUZE/BANDAGES/DRESSINGS) ×1 IMPLANT
BNDG COHESIVE 6X5 TAN STRL LF (GAUZE/BANDAGES/DRESSINGS) ×2 IMPLANT
BNDG ESMARK 6X9 LF (GAUZE/BANDAGES/DRESSINGS) ×2
BNDG GAUZE ELAST 4 BULKY (GAUZE/BANDAGES/DRESSINGS) ×2 IMPLANT
DECANTER SPIKE VIAL GLASS SM (MISCELLANEOUS) ×2 IMPLANT
DRAPE EXTREMITY T 121X128X90 (DRAPE) ×2 IMPLANT
DRSG EMULSION OIL 3X16 NADH (GAUZE/BANDAGES/DRESSINGS) ×2 IMPLANT
DRSG PAD ABDOMINAL 8X10 ST (GAUZE/BANDAGES/DRESSINGS) ×4 IMPLANT
DURAPREP 26ML APPLICATOR (WOUND CARE) ×2 IMPLANT
ELECT REM PT RETURN 9FT ADLT (ELECTROSURGICAL) ×2
ELECTRODE REM PT RTRN 9FT ADLT (ELECTROSURGICAL) ×1 IMPLANT
GAUZE SPONGE 4X4 12PLY STRL (GAUZE/BANDAGES/DRESSINGS) ×2 IMPLANT
GLOVE BIO SURGEON STRL SZ7.5 (GLOVE) ×2 IMPLANT
GLOVE BIO SURGEON STRL SZ8 (GLOVE) ×2 IMPLANT
GLOVE BIOGEL PI IND STRL 6.5 (GLOVE) ×2 IMPLANT
GLOVE BIOGEL PI IND STRL 8 (GLOVE) ×3 IMPLANT
GLOVE BIOGEL PI INDICATOR 6.5 (GLOVE) ×2
GLOVE BIOGEL PI INDICATOR 8 (GLOVE) ×3
GLOVE SURG SS PI 6.5 STRL IVOR (GLOVE) ×4 IMPLANT
GOWN STRL REUS W/ TWL LRG LVL4 (GOWN DISPOSABLE) ×2 IMPLANT
GOWN STRL REUS W/TWL LRG LVL3 (GOWN DISPOSABLE) ×2 IMPLANT
GOWN STRL REUS W/TWL LRG LVL4 (GOWN DISPOSABLE) ×2
GOWN STRL REUS W/TWL XL LVL3 (GOWN DISPOSABLE) ×2 IMPLANT
KIT BASIN OR (CUSTOM PROCEDURE TRAY) ×2 IMPLANT
MANIFOLD NEPTUNE II (INSTRUMENTS) ×2 IMPLANT
NEEDLE HYPO 22GX1.5 SAFETY (NEEDLE) ×2 IMPLANT
PACK TOTAL JOINT (CUSTOM PROCEDURE TRAY) ×2 IMPLANT
PAD ABD 8X10 STRL (GAUZE/BANDAGES/DRESSINGS) ×2 IMPLANT
PADDING CAST ABS 6INX4YD NS (CAST SUPPLIES) ×1
PADDING CAST ABS COTTON 6X4 NS (CAST SUPPLIES) ×1 IMPLANT
PADDING CAST COTTON 6X4 STRL (CAST SUPPLIES) ×6 IMPLANT
PASSER SUT SWANSON 36MM LOOP (INSTRUMENTS) IMPLANT
PIN THREADED GUIDE ACE (PIN) ×4 IMPLANT
POSITIONER SURGICAL ARM (MISCELLANEOUS) ×2 IMPLANT
SCREW CANN 6.5 35MM (Screw) ×2 IMPLANT
SCREW CANN 6.5 40MM (Screw) ×1 IMPLANT
SCREW CANN 6.5 CUT FLUT 40X22X (Screw) ×1 IMPLANT
SPLINT FIBERGLASS 4X30 (CAST SUPPLIES) ×4 IMPLANT
STRIP CLOSURE SKIN 1/2X4 (GAUZE/BANDAGES/DRESSINGS) IMPLANT
SUT ETHIBOND NAB CT1 #1 30IN (SUTURE) IMPLANT
SUT MNCRL AB 4-0 PS2 18 (SUTURE) IMPLANT
SUT VIC AB 0 CT1 27 (SUTURE)
SUT VIC AB 0 CT1 27XBRD ANTBC (SUTURE) IMPLANT
SUT VIC AB 1 CT1 27 (SUTURE)
SUT VIC AB 1 CT1 27XBRD ANTBC (SUTURE) IMPLANT
SUT VIC AB 2-0 CT1 27 (SUTURE) ×2
SUT VIC AB 2-0 CT1 TAPERPNT 27 (SUTURE) ×2 IMPLANT
SYR CONTROL 10ML LL (SYRINGE) ×2 IMPLANT
TOWEL OR 17X26 10 PK STRL BLUE (TOWEL DISPOSABLE) ×4 IMPLANT
WASHER ACECAN 6.5 (Washer) ×4 IMPLANT
WATER STERILE IRR 1500ML POUR (IV SOLUTION) ×6 IMPLANT

## 2014-12-29 NOTE — Op Note (Signed)
NAMESHAQUITTA, DIMOS                ACCOUNT NO.:  [id number]  MEDICAL RECORD NO.:  [id number]  LOCATION:  22                         FACILITY:  [facility]  PHYSICIAN:  Gaynelle Arabian, M.D.    DATE OF BIRTH:  28-Jun-1945  DATE OF PROCEDURE:  12/29/2014 DATE OF DISCHARGE:                              OPERATIVE REPORT   PREOPERATIVE DIAGNOSIS:  Avulsion fracture, posterior aspect, left calcaneus.  POSTOPERATIVE DIAGNOSIS:  Avulsion fracture, posterior aspect, left calcaneus.  PROCEDURE:  Open reduction and internal fixation of left calcaneus fracture.  SURGEON:  Gaynelle Arabian, M.D.  ASSISTANT:  Alexzandrew L. Perkins, P.A.C.  ANESTHESIA:  General.  ESTIMATED BLOOD LOSS:  Minimal.  DRAINS:  Hemovac x1.  COMPLICATIONS:  None.  CONDITION:  Stable to recovery.  BRIEF CLINICAL NOTE:  Ms. Fleurimond is a 69 year old female, who was at home today, trying to fix drapes upon her tiptoes and all of sudden, she felt a pop in her left heel.  She went to the Emergency Room at [facility] and was noted to have a calcaneal tuberosity avulsion fracture.  She came to the [facility], radiographs confirmed that and we were consulted for management.  She presents now for open reduction and internal fixation of this displaced fracture.  PROCEDURE IN DETAIL:  After successful administration of general anesthetic, the patient was placed in the prone position with bony prominences well padded.  She had a tourniquet placed on her left thigh prior to being placed prone.  She was placed in prone position.  The left lower extremity was isolated, and prepped and draped in the usual sterile fashion.  Extremity was elevated and wrapped in Esmarch, and tourniquet was inflated to 300 mmHg.  Incision was made posteriorly in the midline, slightly lateral to the midline over the calcaneus coursing about 4 cm proximal.  The skin was cut with a 10-blade to the subcutaneous tissue, which was carefully dissected.   The fracture fragment was immediately identified under the skin.  The Achilles tendon was attached to the fracture fragment.  I was able to anatomically reduced the fragment and hold it in place with a reduction clamp.  Under fluoroscopic guidance, guidepin for 6.5 cannulated screw was passed perpendicular to the fracture line.  A second guidepin was passed parallel to this wound.  It effectively reduced the fracture.  The lengths were measured and the first screw would be 40 mm and second 35 mm.  The Biomet 6.5-mm cannulated screws were then passed over the guidepins.  Washers were also placed.  After this was partially tightened down, then the reduction clamp was removed.  Then, the screws were fully tightened with anatomic reduction of the fracture.  This was confirmed visually as well as confirmed by AP and lateral fluoroscopy. The screws were tightened down with maximal compression.  The wound was then copiously irrigated with saline solution and the subcutaneous tissue was then closed with interrupted 2-0 Vicryl.  Skin was closed with staples.  Tourniquet was then released, total time approximately 30 minutes.  The incision was then cleaned and dried, and a bulky sterile dressing applied.  She was placed into a posterior splint with the foot  plantarflexed about 10 degrees.  When the splint hardened, then she was placed back in supine position, awakened and transported to recovery in stable condition.  Note that a surgical assistant was a medical necessity for this procedure to help hold the fragment in a reduced position while the hardware was placed and also for retraction of the vital neurovascular structures.  Also note that I injected 10 mL of 0.25% Marcaine into the subcutaneous tissues prior to closing them.     Gaynelle Arabian, M.D.     FA/MEDQ  D:  12/29/2014  T:  12/29/2014  Job:  [id number]

## 2014-12-29 NOTE — H&P (Addendum)
Jaclyn Herrera is an 69 y.o. female.   Chief Complaint: Left ankle pain HPI: Patient is a 69 year old female who sustained an injury earlier today at home when she twisted her left ankle and fell.  She went to stand up on her tip toes to adjust a blind when she stepped back and twisted the left ankle under her.  She had immediate onset of pain and was unable to stand or walk following the injury.   Fortunately, locksmith was headed over to her house already and when he arrived, he came in and called 911 for the patient.  She was brought to [facility] where x-rays proved to be positive for a displaced closed left calcaneal fracture.  A posterior splint was ordered for support and she was seen for evaluation. Dr. Gaynelle Arabian was on call for [facility] and consulted to evaluated the fracture and determine treatment plan.  Radiographs were reviewed and it was felt that she would likely require surgical intervention to get the fractured calcaneous reduced. She is currently NPO at this time.   Past Medical History  Diagnosis Date  . Diabetes mellitus   . Hypertension   . Arthritis   . Shingles   . hypothyroidism   . Bronchitis   . Hyperlipidemia   . GERD (gastroesophageal reflux disease)     Past Surgical History  Procedure Laterality Date  . Cholecystectomy    . Ankle surgery      x 4  . Non cancerous mass removed      small intestine  . Tonsillectomy    . Nasal sinus surgery    . Cataract extraction Bilateral   . Appendectomy    . Knee arthroscopy      Family History  Problem Relation Age of Onset  . Diabetes Mother   . Asthma Mother   . Hypertension Father   . Heart disease Father   . Diabetes Father   . Heart disease Sister   . Hypertension Sister   . Asthma Sister   . Diabetes Brother   . CAD Brother   . Stroke Brother    Social History:  reports that she has never smoked. She has never used smokeless tobacco. She reports that she does not drink alcohol or use illicit  drugs.  Allergies:  Allergies  Allergen Reactions  . Sulfonamide Derivatives Anaphylaxis  . Bactrim [Sulfamethoxazole-Trimethoprim] Nausea Only  . Erythromycin Nausea And Vomiting  . Mucinex [Guaifenesin Er] Nausea And Vomiting  . Aspirin Rash  . Levofloxacin Rash  . Tape Rash   No current facility-administered medications on file prior to encounter.   Current Outpatient Prescriptions on File Prior to Encounter  Medication Sig Dispense Refill  . acetaminophen (TYLENOL) 500 MG tablet Take 500-1,000 mg by mouth every 6 (six) hours as needed for headache.     Marland Kitchen amLODipine (NORVASC) 5 MG tablet Take 1 tablet (5 mg total) by mouth daily. 30 tablet 12  . carisoprodol (SOMA) 350 MG tablet Take 175-350 mg by mouth daily as needed for muscle spasms. For cramps    . furosemide (LASIX) 20 MG tablet Take 20 mg by mouth 2 (two) times daily as needed for fluid.     Marland Kitchen insulin regular human CONCENTRATED (HUMULIN R) 500 UNIT/ML SOLN injection Inject 60-150 Units into the skin 3 (three) times daily before meals. Sliding scale range is reported 60-150 units based on a sliding scale. She takes it 30 minutes before meals.    Marland Kitchen levothyroxine (SYNTHROID,  LEVOTHROID) 125 MCG tablet Take 125-187.5 mcg by mouth daily. Takes 125 mcg daily. On Sundays she takes 187.5 mcg    . PARoxetine (PAXIL) 30 MG tablet Take 30 mg by mouth daily.    . Polyethylene Glycol 400 (BLINK TEARS OP) Place 1 drop into both eyes 4 (four) times daily as needed (Dry eyes).     . quinapril (ACCUPRIL) 40 MG tablet Take 1 tablet (40 mg total) by mouth daily.    . Senna-Psyllium (PERDIEM PO) Take 1 tablet by mouth daily as needed (constipation).     . simvastatin (ZOCOR) 20 MG tablet Take 20 mg by mouth at bedtime.      . mometasone (ELOCON) 0.1 % cream   Apply 1 application topically daily.     . Polyethylene Glycol 400 (BLINK TEARS OP)    Place 1 drop into both eyes 4 (four) times daily as needed (Dry eyes).    acetaminophen-codeine  (TYLENOL #3) 300-30 MG per tablet Take 1 tablet by mouth every 4 (four) hours as needed for moderate pain.  albuterol (PROVENTIL HFA;VENTOLIN HFA) 108 (90 BASE) MCG/ACT inhaler Inhale 2 puffs into the lungs every 4 (four) hours as needed for wheezing or shortness of breath.  esomeprazole (NEXIUM) 20 MG capsule Take 40 mg by mouth daily at 12 noon.  fexofenadine (ALLEGRA) 180 MG tablet Take 180 mg by mouth daily as needed for allergies or rhinitis.  meclizine (ANTIVERT) 25 MG tablet Take 25 mg by mouth 2 (two) times daily.  vitamin C (ASCORBIC ACID) 500 MG tablet Take 1,000 mg by mouth daily.   No results found for this or any previous visit (from the past 48 hour(s)). Dg Ankle Complete Left  12/29/2014   CLINICAL DATA:  Pain, swelling, and bruising of the left foot and ankle secondary to a twisting injury.  EXAM: LEFT ANKLE COMPLETE - 3+ VIEW  COMPARISON:  None.  FINDINGS: There is a fracture through the posterior superior aspect of the calcaneus. The fragment is approximately 3.7 x 1.6 cm and includes the Achilles insertion. The posterior aspect of the fracture is distracted 2 cm.  There is diffuse soft tissue swelling. Prominent plantar calcaneal osteophyte.  IMPRESSION: Fracture of the posterior superior aspect of the calcaneus with displacement.   Electronically Signed   By: Lorriane Shire M.D.   On: 12/29/2014 13:37   Dg Foot Complete Left  12/29/2014   CLINICAL DATA:  Rolled left foot and ankle with pain bruising left heel  EXAM: LEFT FOOT - COMPLETE 3+ VIEW  COMPARISON:  None.  FINDINGS: Diffuse osteopenia. Widely separated fracture involving the calcaneus tuberosity. Superior fracture fragment is displaced nearly 2 cm proximally, suggesting injury involving Achilles tendon. There is a small heel spur as well.  IMPRESSION: Acute fracture calcaneus tuberosity.   Electronically Signed   By: Skipper Cliche M.D.   On: 12/29/2014 13:41    Review of Systems  Musculoskeletal: Positive for  joint pain (left foot and heel pain).   Blood pressure 93/70, pulse 81, temperature 97.9 F (36.6 C), temperature source Oral, resp. rate 20, SpO2 95 %. Physical Exam  69 year old female seen in hospital bed at [facility] ED Alert and oriented HENT - [place], AT, PRR, EOMIs Chest - clear Heart - RRR without murmur Abdomen - soft, round, NT MS - Left lower extremity - NVI to the left leg Slowly moving toes on exam Ecchymosis noted on the posterior ankle at the bas of the Achilles tendon. +2 DP noted  on exam  Assessment/Plan Displaced closed left calcaneal fracture  Patient seen in the emergency room today for Dr. Wynelle Link and found to have a displaced left calcaneal fracture.  It is felt that she would likely require surgical intervention for the injury that she sustained earlier this morning at home.  X-rays were reviewed with the patient and she voiced understanding the recommendation to proceed with surgery.  Dr. Wynelle Link was notified and will evaluate the patient later today to discuss further treatment and surgery.   Mickel Crow 12/29/2014, 3:42 PM   I have seen and examined the patient and discussed surgical treatment with her. She has a displaced calcaneal tuberosity fracture. This needs to be reduced and surgically stabilized to allow for restoration of function and to prevent skin and wound breakdown in this patient with brittle diabetes. We discussed this in detail and she elects to proceed. She lives alone and will need to go to a SNF post-discharge.

## 2014-12-29 NOTE — Anesthesia Postprocedure Evaluation (Signed)
  Anesthesia Post-op Note  Patient: Jaclyn Herrera  Procedure(s) Performed: Procedure(s): OPEN REDUCTION INTERNAL FIXATION  LEFT CALCANEOUS FRACTURE (Left)  Patient Location: PACU  Anesthesia Type:General  Level of Consciousness: awake  Airway and Oxygen Therapy: Patient Spontanous Breathing  Post-op Pain: mild  Post-op Assessment: Post-op Vital signs reviewed              Post-op Vital Signs: Reviewed  Last Vitals:  Filed Vitals:   12/29/14 2000  BP: 146/56  Pulse: 84  Temp:   Resp: 16    Complications: No apparent anesthesia complications

## 2014-12-29 NOTE — Anesthesia Preprocedure Evaluation (Addendum)
Anesthesia Evaluation  Patient identified by MRN, date of birth, ID band Patient awake    Reviewed: Allergy & Precautions, NPO status , Patient's Chart, lab work & pertinent test results  History of Anesthesia Complications (+) PONV  Airway Mallampati: II  TM Distance: >3 FB Neck ROM: Full    Dental   Pulmonary shortness of breath,  breath sounds clear to auscultation        Cardiovascular hypertension, Rhythm:Regular     Neuro/Psych  Headaches,    GI/Hepatic Neg liver ROS, GERD-  ,  Endo/Other  diabetes  Renal/GU Renal disease     Musculoskeletal   Abdominal   Peds  Hematology   Anesthesia Other Findings   Reproductive/Obstetrics                            Anesthesia Physical Anesthesia Plan  ASA: II  Anesthesia Plan: General   Post-op Pain Management:    Induction: Intravenous  Airway Management Planned: Oral ETT  Additional Equipment:   Intra-op Plan:   Post-operative Plan: Extubation in OR  Informed Consent: I have reviewed the patients History and Physical, chart, labs and discussed the procedure including the risks, benefits and alternatives for the proposed anesthesia with the patient or authorized representative who has indicated his/her understanding and acceptance.   Dental advisory given  Plan Discussed with:   Anesthesia Plan Comments:         Anesthesia Quick Evaluation

## 2014-12-29 NOTE — ED Provider Notes (Signed)
CSN: [id number]     Arrival date & time 12/29/14  1222 History   First MD Initiated Contact with Patient 12/29/14 1224     Chief Complaint  Patient presents with  . Fall  . Foot Injury     (Consider location/radiation/quality/duration/timing/severity/associated sxs/prior Treatment) HPI  Blood pressure 93/70, pulse 81, temperature 97.9 F (36.6 C), temperature source Oral, resp. rate 20, SpO2 95 %.  Jaclyn Herrera is a 69 y.o. female complaining of pain to left ankle. She states she was standing over her kitchen sink, she went to close the blinds and felt immediate severe pain in the left ankle, she then lowered herself down she stabilized herself on the scene, there was no fall or head trauma. She has not been ambulatory she scooted to the phone to call EMS. Patient has had multiple surgeries to the right ankle, she rates her pain at 8 out of 10, states that she thinks she has done damage to her Achilles tendon because her pain is in the posterior. Pt denies head trauma, LOC, N/V, change in vision, cervicalgia, chest pain, SOB, abdominal pain, difficulty ambulating, numbness, weakness, difficulty moving major joints, EtOH/illicit drug/perscription drug use that would alter awareness.   OrthoLady [facility] ortho  Past Medical History  Diagnosis Date  . Diabetes mellitus   . Hypertension   . Arthritis   . Shingles   . hypothyroidism   . Bronchitis   . Hyperlipidemia   . GERD (gastroesophageal reflux disease)    Past Surgical History  Procedure Laterality Date  . Cholecystectomy    . Ankle surgery      x 4  . Non cancerous mass removed      small intestine  . Tonsillectomy    . Nasal sinus surgery    . Cataract extraction Bilateral   . Appendectomy    . Knee arthroscopy     Family History  Problem Relation Age of Onset  . Diabetes Mother   . Asthma Mother   . Hypertension Father   . Heart disease Father   . Diabetes Father   . Heart disease Sister   . Hypertension  Sister   . Asthma Sister   . Diabetes Brother   . CAD Brother   . Stroke Brother    History  Substance Use Topics  . Smoking status: Never Smoker   . Smokeless tobacco: Never Used  . Alcohol Use: No   OB History    No data available     Review of Systems  10 systems reviewed and found to be negative, except as noted in the HPI.  Allergies  Sulfonamide derivatives; Bactrim; Erythromycin; Mucinex; Aspirin; Levofloxacin; and Tape  Home Medications   Prior to Admission medications   Medication Sig Start Date End Date Taking? Authorizing Provider  acetaminophen (TYLENOL) 500 MG tablet Take 500-1,000 mg by mouth every 6 (six) hours as needed for headache.    Yes Historical Provider, MD  acetaminophen-codeine (TYLENOL #3) 300-30 MG per tablet Take 1 tablet by mouth every 4 (four) hours as needed for moderate pain.   Yes Historical Provider, MD  albuterol (PROVENTIL HFA;VENTOLIN HFA) 108 (90 BASE) MCG/ACT inhaler Inhale 2 puffs into the lungs every 4 (four) hours as needed for wheezing or shortness of breath.   Yes Historical Provider, MD  amLODipine (NORVASC) 5 MG tablet Take 1 tablet (5 mg total) by mouth daily. 11/03/14  Yes Asencion Noble, MD  amoxicillin-clavulanate (AUGMENTIN) 875-125 MG per tablet Take 1 tablet by  mouth 2 (two) times daily. 2 week course that started last Friday 7/15 12/24/14  Yes Historical Provider, MD  carisoprodol (SOMA) 350 MG tablet Take 175-350 mg by mouth daily as needed for muscle spasms. For cramps   Yes Historical Provider, MD  esomeprazole (NEXIUM) 20 MG capsule Take 40 mg by mouth daily at 12 noon.   Yes Historical Provider, MD  fexofenadine (ALLEGRA) 180 MG tablet Take 180 mg by mouth daily as needed for allergies or rhinitis.   Yes Historical Provider, MD  fluticasone (FLONASE) 50 MCG/ACT nasal spray Place 2 sprays into the nose 2 (two) times daily as needed for allergies.  10/28/14  Yes Historical Provider, MD  furosemide (LASIX) 20 MG tablet Take 20 mg by  mouth 2 (two) times daily as needed for fluid.    Yes Historical Provider, MD  insulin regular human CONCENTRATED (HUMULIN R) 500 UNIT/ML SOLN injection Inject 60-150 Units into the skin 3 (three) times daily before meals. Sliding scale range is reported 60-150 units based on a sliding scale. She takes it 30 minutes before meals.   Yes Historical Provider, MD  levothyroxine (SYNTHROID, LEVOTHROID) 125 MCG tablet Take 125-187.5 mcg by mouth daily. Takes 125 mcg daily. On Sundays she takes 187.5 mcg   Yes Historical Provider, MD  meclizine (ANTIVERT) 25 MG tablet Take 25 mg by mouth 2 (two) times daily.   Yes Historical Provider, MD  mometasone (ELOCON) 0.1 % cream Apply 1 application topically daily.  12/24/14  Yes Historical Provider, MD  PARoxetine (PAXIL) 30 MG tablet Take 30 mg by mouth daily.   Yes Historical Provider, MD  Polyethylene Glycol 400 (BLINK TEARS OP) Place 1 drop into both eyes 4 (four) times daily as needed (Dry eyes).    Yes Historical Provider, MD  quinapril (ACCUPRIL) 40 MG tablet Take 1 tablet (40 mg total) by mouth daily. 05/30/13  Yes Burnell Blanks, MD  Senna-Psyllium (PERDIEM PO) Take 1 tablet by mouth daily as needed (constipation).    Yes Historical Provider, MD  simvastatin (ZOCOR) 20 MG tablet Take 20 mg by mouth at bedtime.     Yes Historical Provider, MD  vitamin C (ASCORBIC ACID) 500 MG tablet Take 1,000 mg by mouth daily.   Yes Historical Provider, MD  cephALEXin (KEFLEX) 500 MG capsule Take 1 capsule (500 mg total) by mouth every 8 (eight) hours. Patient not taking: Reported on 12/29/2014 11/03/14   Asencion Noble, MD  diazepam (VALIUM) 2 MG tablet Take 1 tablet (2 mg total) by mouth 2 (two) times daily. Patient not taking: Reported on 12/29/2014 11/03/14   Asencion Noble, MD   BP 93/70 mmHg  Pulse 81  Temp(Src) 97.9 F (36.6 C) (Oral)  Resp 20  SpO2 95% Physical Exam  Constitutional: She is oriented to person, place, and time. She appears well-developed and  well-nourished. No distress.  HENT:  Head: Normocephalic and atraumatic.  Mouth/Throat: Oropharynx is clear and moist.  Eyes: Conjunctivae and EOM are normal. Pupils are equal, round, and reactive to light.  Neck: Normal range of motion.  Cardiovascular: Normal rate, regular rhythm and intact distal pulses.   Pulmonary/Chest: Effort normal and breath sounds normal. No stridor. No respiratory distress. She has no wheezes. She has no rales. She exhibits no tenderness.  Abdominal: Soft. Bowel sounds are normal. She exhibits no distension and no mass. There is no tenderness. There is no rebound and no guarding.  Musculoskeletal: Normal range of motion. She exhibits edema and tenderness.  Feet:  Pt has remote surgical scars to right lower ankle, dorsalis pedis is 2+ on the left. Patient is exquisitely tender to palpation along the Achilles +Contuson and firmness underskin. No malleolar or tenderness  Neurological: She is alert and oriented to person, place, and time.  Psychiatric: She has a normal mood and affect.  Nursing note and vitals reviewed.   ED Course  Procedures (including critical care time) Labs Review Labs Reviewed  CBC  COMPREHENSIVE METABOLIC PANEL  PROTIME-INR  URINALYSIS, ROUTINE W REFLEX MICROSCOPIC (NOT AT [facility])    Imaging Review Dg Ankle Complete Left  12/29/2014   CLINICAL DATA:  Pain, swelling, and bruising of the left foot and ankle secondary to a twisting injury.  EXAM: LEFT ANKLE COMPLETE - 3+ VIEW  COMPARISON:  None.  FINDINGS: There is a fracture through the posterior superior aspect of the calcaneus. The fragment is approximately 3.7 x 1.6 cm and includes the Achilles insertion. The posterior aspect of the fracture is distracted 2 cm.  There is diffuse soft tissue swelling. Prominent plantar calcaneal osteophyte.  IMPRESSION: Fracture of the posterior superior aspect of the calcaneus with displacement.   Electronically Signed   By: Lorriane Shire M.D.   On:  12/29/2014 13:37   Dg Foot Complete Left  12/29/2014   CLINICAL DATA:  Rolled left foot and ankle with pain bruising left heel  EXAM: LEFT FOOT - COMPLETE 3+ VIEW  COMPARISON:  None.  FINDINGS: Diffuse osteopenia. Widely separated fracture involving the calcaneus tuberosity. Superior fracture fragment is displaced nearly 2 cm proximally, suggesting injury involving Achilles tendon. There is a small heel spur as well.  IMPRESSION: Acute fracture calcaneus tuberosity.   Electronically Signed   By: Skipper Cliche M.D.   On: 12/29/2014 13:41     EKG Interpretation None      MDM   Final diagnoses:  Calcaneal fracture, left, closed, initial encounter    Filed Vitals:   12/29/14 1243  BP: 93/70  Pulse: 81  Temp: 97.9 F (36.6 C)  TempSrc: Oral  Resp: 20  SpO2: 95%    Medications  morphine 2 MG/ML injection 1-2 mg (not administered)  morphine 4 MG/ML injection 4 mg (4 mg Intravenous Given 12/29/14 1425)    Jaclyn Herrera is a pleasant 69 y.o. female presenting wisevere acute onset of left ankle pain we'll patient was simply standing. Patient has a contusion is point tender to palpation along the inferior left Achilles. She is distally neurovascularly intact. X-ray shows a large fracture of the posterior aspect of the calcaneus.  Orthopedic consult from Dr. Elmyra Ricks appreciated: He will come to evaluate the patient.  This is a shared visit with the attending physician who personally evaluated the patient and agrees with the care plan.   Patient will be admitted for surgical  Management.     Monico Blitz, PA-C 12/29/14 1547  Elnora Morrison, MD 01/01/15 [phone]

## 2014-12-29 NOTE — ED Notes (Signed)
Pt's valuables inventoried by OR staff member in ED Room #1 and taken to Security by this Agricultural consultant.  Belongings placed in [place] #2.  Gwyndolyn Saxon NT walked inventory sheet and key to OR and gave items to Waymon Budge RN.

## 2014-12-29 NOTE — Consult Note (Signed)
[facility] Consult Note  Jaclyn Herrera:[id number] DOB: April 18, 1946 DOA: 12/29/2014  Referring physician: Avel Peace, PA PCP: Carylon Perches, MD   Chief Complaint: Rip Harbour DM  HPI: Jaclyn Herrera is a 69 y.o. female with history of Manic depression HTN DM II presented with a calcaneal fracture. Patient apparently twisted her ankle earlier in the day after a fall. Patient was not able to stand after the fall. Patient was noted in the ED to have a displaced left calcaneal fracture. She has just undergone surgical repair. She has DM II and her sugars have been poorly controlled. We are asked to see her for management. Currently she is awake and alert. She has no cough or congestion,. No chest pain noted. She denies fevers or chills. No abdominal pain noted.   Review of Systems:  Systems reviewed and unremarkable other than HPI  Past Medical History  Diagnosis Date  . Diabetes mellitus   . Hypertension   . Arthritis   . Shingles   . hypothyroidism   . Bronchitis   . Hyperlipidemia   . GERD (gastroesophageal reflux disease)   . PONV (postoperative nausea and vomiting)    Past Surgical History  Procedure Laterality Date  . Cholecystectomy    . Ankle surgery      x 4  . Non cancerous mass removed      small intestine  . Tonsillectomy    . Nasal sinus surgery    . Cataract extraction Bilateral   . Appendectomy    . Knee arthroscopy     Social History:  reports that she has never smoked. She has never used smokeless tobacco. She reports that she does not drink alcohol or use illicit drugs.  Allergies  Allergen Reactions  . Sulfonamide Derivatives Anaphylaxis  . Bactrim [Sulfamethoxazole-Trimethoprim] Nausea Only  . Erythromycin Nausea And Vomiting  . Mucinex [Guaifenesin Er] Nausea And Vomiting  . Aspirin Rash  . Levofloxacin Rash  . Tape Rash    Family History  Problem Relation Age of Onset  . Diabetes Mother   . Asthma Mother   . Hypertension Father   .  Heart disease Father   . Diabetes Father   . Heart disease Sister   . Hypertension Sister   . Asthma Sister   . Diabetes Brother   . CAD Brother   . Stroke Brother      Prior to Admission medications   Medication Sig Start Date End Date Taking? Authorizing Provider  acetaminophen (TYLENOL) 500 MG tablet Take 500-1,000 mg by mouth every 6 (six) hours as needed for headache.    Yes Historical Provider, MD  acetaminophen-codeine (TYLENOL #3) 300-30 MG per tablet Take 1 tablet by mouth every 4 (four) hours as needed for moderate pain.   Yes Historical Provider, MD  albuterol (PROVENTIL HFA;VENTOLIN HFA) 108 (90 BASE) MCG/ACT inhaler Inhale 2 puffs into the lungs every 4 (four) hours as needed for wheezing or shortness of breath.   Yes Historical Provider, MD  amLODipine (NORVASC) 5 MG tablet Take 1 tablet (5 mg total) by mouth daily. 11/03/14  Yes Carylon Perches, MD  amoxicillin-clavulanate (AUGMENTIN) 875-125 MG per tablet Take 1 tablet by mouth 2 (two) times daily. 2 week course that started last Friday 7/15 12/24/14  Yes Historical Provider, MD  carisoprodol (SOMA) 350 MG tablet Take 175-350 mg by mouth daily as needed for muscle spasms. For cramps   Yes Historical Provider, MD  esomeprazole (NEXIUM) 20 MG capsule Take 40 mg by mouth  daily at 12 noon.   Yes Historical Provider, MD  fexofenadine (ALLEGRA) 180 MG tablet Take 180 mg by mouth daily as needed for allergies or rhinitis.   Yes Historical Provider, MD  fluticasone (FLONASE) 50 MCG/ACT nasal spray Place 2 sprays into the nose 2 (two) times daily as needed for allergies.  10/28/14  Yes Historical Provider, MD  furosemide (LASIX) 20 MG tablet Take 20 mg by mouth 2 (two) times daily as needed for fluid.    Yes Historical Provider, MD  insulin regular human CONCENTRATED (HUMULIN R) 500 UNIT/ML SOLN injection Inject 60-150 Units into the skin 3 (three) times daily before meals. Sliding scale range is reported 60-150 units based on a sliding scale.  She takes it 30 minutes before meals.   Yes Historical Provider, MD  levothyroxine (SYNTHROID, LEVOTHROID) 125 MCG tablet Take 125-187.5 mcg by mouth daily. Takes 125 mcg daily. On Sundays she takes 187.5 mcg   Yes Historical Provider, MD  meclizine (ANTIVERT) 25 MG tablet Take 25 mg by mouth 2 (two) times daily.   Yes Historical Provider, MD  mometasone (ELOCON) 0.1 % cream Apply 1 application topically daily.  12/24/14  Yes Historical Provider, MD  PARoxetine (PAXIL) 30 MG tablet Take 30 mg by mouth daily.   Yes Historical Provider, MD  Polyethylene Glycol 400 (BLINK TEARS OP) Place 1 drop into both eyes 4 (four) times daily as needed (Dry eyes).    Yes Historical Provider, MD  quinapril (ACCUPRIL) 40 MG tablet Take 1 tablet (40 mg total) by mouth daily. 05/30/13  Yes Kathleene Hazel, MD  Senna-Psyllium (PERDIEM PO) Take 1 tablet by mouth daily as needed (constipation).    Yes Historical Provider, MD  simvastatin (ZOCOR) 20 MG tablet Take 20 mg by mouth at bedtime.     Yes Historical Provider, MD  vitamin C (ASCORBIC ACID) 500 MG tablet Take 1,000 mg by mouth daily.   Yes Historical Provider, MD  cephALEXin (KEFLEX) 500 MG capsule Take 1 capsule (500 mg total) by mouth every 8 (eight) hours. Patient not taking: Reported on 12/29/2014 11/03/14   Carylon Perches, MD  diazepam (VALIUM) 2 MG tablet Take 1 tablet (2 mg total) by mouth 2 (two) times daily. Patient not taking: Reported on 12/29/2014 11/03/14   Carylon Perches, MD   Physical Exam: Filed Vitals:   12/29/14 1939 12/29/14 1945 12/29/14 2000 12/29/14 2015  BP: 159/50 151/47 146/56 140/51  Pulse: 79 82 84 79  Temp: 98.2 F (36.8 C)     TempSrc:      Resp: 17 16 16 10   SpO2: 100% 100% 100% 100%    Wt Readings from Last 3 Encounters:  10/31/14 102.059 kg (225 lb)  09/30/14 100 kg (220 lb 7.4 oz)  07/14/13 101.606 kg (224 lb)    General:  Appears calm and comfortable Eyes: PERRL, normal lids, irises & conjunctiva ENT: grossly normal  hearing, lips & tongue Neck: no LAD, masses or thyromegaly Cardiovascular: RRR, no m/r/g. No LE edema Respiratory: CTA bilaterally, no w/r/r. Normal respiratory effort. Abdomen: soft, ntnd Skin: no rash or induration seen on limited exam Musculoskeletal: grossly normal tone BUE/BLE Psychiatric: grossly normal mood and affect Neurologic: grossly non-focal.          Labs on Admission:  Basic Metabolic Panel:  Recent Labs Lab 12/29/14 1538  NA 134*  K 5.2*  CL 105  CO2 22  GLUCOSE 515*  BUN 20  CREATININE 0.79  CALCIUM 8.7*   Liver Function Tests:  Recent Labs Lab 12/29/14 1538  AST 14*  ALT 15  ALKPHOS 134*  BILITOT 2.1*  PROT 7.2  ALBUMIN 4.3   No results for input(s): LIPASE, AMYLASE in the last 168 hours. No results for input(s): AMMONIA in the last 168 hours. CBC:  Recent Labs Lab 12/29/14 1538  WBC 7.2  HGB 11.0*  HCT 33.3*  MCV 81.6  PLT 144*   Cardiac Enzymes: No results for input(s): CKTOTAL, CKMB, CKMBINDEX, TROPONINI in the last 168 hours.  BNP (last 3 results) No results for input(s): BNP in the last 8760 hours.  ProBNP (last 3 results) No results for input(s): PROBNP in the last 8760 hours.  CBG:  Recent Labs Lab 12/29/14 1600 12/29/14 1911 12/29/14 1942  GLUCAP 450* 398* 386*    Radiological Exams on Admission: Dg Ankle Complete Left  12/29/2014   CLINICAL DATA:  Pain, swelling, and bruising of the left foot and ankle secondary to a twisting injury.  EXAM: LEFT ANKLE COMPLETE - 3+ VIEW  COMPARISON:  None.  FINDINGS: There is a fracture through the posterior superior aspect of the calcaneus. The fragment is approximately 3.7 x 1.6 cm and includes the Achilles insertion. The posterior aspect of the fracture is distracted 2 cm.  There is diffuse soft tissue swelling. Prominent plantar calcaneal osteophyte.  IMPRESSION: Fracture of the posterior superior aspect of the calcaneus with displacement.   Electronically Signed   By: Francene Boyers M.D.   On: 12/29/2014 13:37   Dg Os Calcis Left  12/29/2014   CLINICAL DATA:  Calcaneal fracture  EXAM: LEFT OS CALCIS - 2+ VIEW  COMPARISON:  Radiographs from earlier the same day  FINDINGS: Two fluoroscopic spot images document fixation of the calcaneal tuberosity fracture with 2 orthopedic screws, fragments in near anatomic alignment.  IMPRESSION: 1. Internal fixation of calcaneal tuberosity fracture in near anatomic alignment.   Electronically Signed   By: Corlis Leak M.D.   On: 12/29/2014 19:48   Dg Foot Complete Left  12/29/2014   CLINICAL DATA:  Rolled left foot and ankle with pain bruising left heel  EXAM: LEFT FOOT - COMPLETE 3+ VIEW  COMPARISON:  None.  FINDINGS: Diffuse osteopenia. Widely separated fracture involving the calcaneus tuberosity. Superior fracture fragment is displaced nearly 2 cm proximally, suggesting injury involving Achilles tendon. There is a small heel spur as well.  IMPRESSION: Acute fracture calcaneus tuberosity.   Electronically Signed   By: Esperanza Heir M.D.   On: 12/29/2014 13:41   Dg C-arm 1-60 Min-no Report  12/29/2014   CLINICAL DATA: surgery   C-ARM 1-60 MINUTES  Fluoroscopy was utilized by the requesting physician.  No radiographic  interpretation.       Assessment/Plan   1. Diabetes Mellitus type II -started on SSI coverage -continue with home regimen -will check A1C  2. HTN -continue with antihypertensives -monitor pressures  3. GERD -PPI ordered will continue  4. Hyperlipidemia -will check lipid panel  5. Hypothyroid -will check TSH -continue with synthroid  6. Calcaneal Fracture -per orthopedics     Code Status: Full Code (must indicate code status--if unknown or must be presumed, indicate so) DVT Prophylaxis:SCD Family Communication: none (indicate person spoken with, if applicable, with phone number if by telephone) Disposition Plan: home (indicate anticipated LOS)  Time spent:  [facility] Pager [phone]

## 2014-12-29 NOTE — Anesthesia Procedure Notes (Signed)
Procedure Name: Intubation Date/Time: 12/29/2014 6:24 PM Performed by: Dione Booze Pre-anesthesia Checklist: Emergency Drugs available, Patient identified, Suction available and Patient being monitored Patient Re-evaluated:Patient Re-evaluated prior to inductionOxygen Delivery Method: Circle system utilized Preoxygenation: Pre-oxygenation with 100% oxygen Intubation Type: IV induction Ventilation: Mask ventilation without difficulty Laryngoscope Size: Mac, 4, Miller and 2 Grade View: Grade III Tube type: Oral Tube size: 7.5 mm Number of attempts: 1 Airway Equipment and Method: Stylet Placement Confirmation: ETT inserted through vocal cords under direct vision,  positive ETCO2 and breath sounds checked- equal and bilateral Secured at: 22 cm Tube secured with: Tape Dental Injury: Teeth and Oropharynx as per pre-operative assessment  Difficulty Due To: Difficulty was anticipated and Difficult Airway- due to anterior larynx Comments: Intubated by Dr.Edwards.

## 2014-12-29 NOTE — Brief Op Note (Signed)
12/29/2014  7:29 PM  PATIENT:  Jaclyn Herrera  69 y.o. female  PRE-OPERATIVE DIAGNOSIS:  left calcanous fracture  POST-OPERATIVE DIAGNOSIS:  left calcanous fracture  PROCEDURE:  Procedure(s): OPEN REDUCTION INTERNAL FIXATION  LEFT CALCANEOUS FRACTURE (Left)  SURGEON:  Surgeon(s) and Role:    * Gaynelle Arabian, MD - Primary  PHYSICIAN ASSISTANT:   ASSISTANTS: Arlee Muslim, PA-C   ANESTHESIA:   general  EBL:  Total I/O In: -  Out: 320 [Urine:300; Blood:20]  DRAINS: none   LOCAL MEDICATIONS USED:  MARCAINE     COUNTS:  YES  TOURNIQUET:  27 minutes @ 300 mm Hg  DICTATION: .Other Dictation: Dictation Number [id number]  PLAN OF CARE: Admit to inpatient   PATIENT DISPOSITION:  PACU - hemodynamically stable.

## 2014-12-29 NOTE — ED Notes (Signed)
Bed: WA01 Expected date:  Expected time:  Means of arrival:  Comments: EMS- 69yo F, L ankle injury

## 2014-12-29 NOTE — ED Notes (Signed)
Per EMS: pt fell at home, c/o left ankle/ foot pain. Pt states moved to quickly and rolled ankle.

## 2014-12-29 NOTE — ED Notes (Signed)
This Agricultural consultant entered the Pt's room to see if I could help get the Pt ready for surgery.  The Pt was very upset about her blood sugar not being addressed prior to transport and mentioned that she had called out for bathroom assistance several times w/o a response.  This Agricultural consultant apologized for the issues and promised to address the complaints w/ staff.  Pt continued to loudly complain about ED staff as she was being transported out of the department by OR staff.  Stating that she "would get names."  Pt's primary RN and NT reported that the Pt had only called out once regarding bathroom assistance prior to having a foley placed and staff reported that they had been in and out of the room several times.  Pt's primary RN reported that she had attempted to address the Pt's glucose, but care had been handed off from the ED provider to the admitting provider.  RN attempted to contact admitting provider, but Pt was ready for OR prior to hearing back.

## 2014-12-29 NOTE — Transfer of Care (Signed)
Immediate Anesthesia Transfer of Care Note  Patient: Jaclyn Herrera  Procedure(s) Performed: Procedure(s): OPEN REDUCTION INTERNAL FIXATION  LEFT CALCANEOUS FRACTURE (Left)  Patient Location: PACU  Anesthesia Type:General  Level of Consciousness:  sedated, patient cooperative and responds to stimulation  Airway & Oxygen Therapy:Patient Spontanous Breathing and Patient connected to face mask oxgen  Post-op Assessment:  Report given to PACU RN and Post -op Vital signs reviewed and stable  Post vital signs:  Reviewed and stable  Last Vitals:  Filed Vitals:   12/29/14 1555  BP: 160/61  Pulse: 83  Temp:   Resp: 16    Complications: No apparent anesthesia complications

## 2014-12-29 NOTE — ED Notes (Signed)
Entered pt's room to get her ready for OR transport. Pt requested to use the restroom and so Foley was inserted per periop protocol order. This is the first time this RN had been informed of pt's need to urinate despite in the room with pt 2 times prior. Also this RN was unaware that values had to be locked up prior to OR transport, nor was aware of the quantity of valuables prior to the time. Pt agitated about CBG >450 and not being treated with insulin by EDP. Discussed with EDP at this time who states that pt attending will need to address and treat at this time. OR transport states that Anesthesiologist will treat hyperglycemia.

## 2014-12-29 NOTE — Progress Notes (Signed)
Pt states she brought her medications from home and she plans on taking them. Lucius Conn BSN, RN-BC Admissions RN  12/29/2014 4:37 PM

## 2014-12-30 ENCOUNTER — Encounter (HOSPITAL_COMMUNITY): Payer: Self-pay | Admitting: Orthopedic Surgery

## 2014-12-30 DIAGNOSIS — E119 Type 2 diabetes mellitus without complications: Secondary | ICD-10-CM

## 2014-12-30 LAB — BASIC METABOLIC PANEL
Anion gap: 9 (ref 5–15)
BUN: 16 mg/dL (ref 6–20)
CALCIUM: 8.2 mg/dL — AB (ref 8.9–10.3)
CHLORIDE: 102 mmol/L (ref 101–111)
CO2: 25 mmol/L (ref 22–32)
Creatinine, Ser: 0.81 mg/dL (ref 0.44–1.00)
GFR calc Af Amer: >60 mL/min (ref >60)
GFR calc non Af Amer: >60 mL/min (ref >60)
GLUCOSE: 345 mg/dL — AB (ref 65–99)
Potassium: 4 mmol/L (ref 3.5–5.1)
SODIUM: 136 mmol/L (ref 135–145)

## 2014-12-30 LAB — GLUCOSE, CAPILLARY
GLUCOSE-CAPILLARY: 213 mg/dL — AB (ref 65–99)
Glucose-Capillary: 160 mg/dL — ABNORMAL HIGH (ref 65–99)
Glucose-Capillary: 165 mg/dL — ABNORMAL HIGH (ref 65–99)
Glucose-Capillary: 267 mg/dL — ABNORMAL HIGH (ref 65–99)
Glucose-Capillary: 288 mg/dL — ABNORMAL HIGH (ref 65–99)
Glucose-Capillary: 333 mg/dL — ABNORMAL HIGH (ref 65–99)
Glucose-Capillary: 363 mg/dL — ABNORMAL HIGH (ref 65–99)
Glucose-Capillary: 459 mg/dL — ABNORMAL HIGH (ref 65–99)
Glucose-Capillary: 86 mg/dL (ref 65–99)

## 2014-12-30 MED ORDER — INSULIN ASPART 100 UNIT/ML ~~LOC~~ SOLN
60.0000 [IU] | Freq: Three times a day (TID) | SUBCUTANEOUS | Status: DC
  Administered 2014-12-30 – 2014-12-31 (×2): 60 [IU] via SUBCUTANEOUS

## 2014-12-30 MED ORDER — INSULIN REGULAR HUMAN (CONC) 500 UNIT/ML ~~LOC~~ SOLN: 60.0000 [IU] | Freq: Three times a day (TID) | SUBCUTANEOUS | Status: DC

## 2014-12-30 MED ORDER — DIPHENHYDRAMINE HCL 25 MG PO CAPS
25.0000 mg | ORAL_CAPSULE | Freq: Once | ORAL | Status: AC
  Administered 2014-12-30: 25 mg via ORAL
  Filled 2014-12-30: qty 1

## 2014-12-30 NOTE — Progress Notes (Signed)
Inpatient Diabetes Program Recommendations  AACE/ADA: New Consensus Statement on Inpatient Glycemic Control (2013)  Target Ranges:  Prepandial:   less than 140 mg/dL      Peak postprandial:   less than 180 mg/dL (1-2 hours)      Critically ill patients:  140 - 180 mg/dL   Reason for Visit: U-500 insulin  Home meds  Diabetes history: DM2 Outpatient Diabetes medications: U-500 10-12 units tidwc + s/s Current orders for Inpatient glycemic control: Novolog moderate Q4H  Results for Jaclyn Herrera, Jaclyn Herrera (MRN [id number]) as of 12/30/2014 14:49  Ref. Range 10/31/2014 14:00 12/29/2014 15:38 12/30/2014 11:48  Glucose Latest Ref Range: 65-99 mg/dL 243 (H) 515 (H) 345 (H)  Results for Jaclyn Herrera, Jaclyn Herrera (MRN [id number]) as of 12/30/2014 14:49  Ref. Range 12/29/2014 15:38 12/30/2014 11:48  Glucose Latest Ref Range: 65-99 mg/dL 515 (H) 345 (H)   Needs basal insulin and meal coverage. Endo is Dr. Forde Dandy. Pt has U-500 insulin but states she is willing to be on Lantus and Novolog while inpatient.  Inpatient Diabetes Program Recommendations Insulin - Basal: Lantus 25 units bid Correction (SSI): Novolog resistant tidwc and hs Insulin - Meal Coverage: Novolog 15 units tidwc HgbA1C: 9.4% - uncontrolled Diet: CHO mod med   Patient takes Humulin R U500 as an outpatient which has the onset of rapid acting insulin and the duration of intermediate acting insulin. CBGs ranged from 160-515 mg/dl  and fasting glucose this morning is 345 mg/dl. Patient needs to be on basal insulin in addition to Novolog correction and meal coverage. Please consider ordering Lantus 25 units BID and decreasing meal coverage to Novolog 15 units TID with meals.  Discussed with RN and pt. Thank you. Lorenda Peck, RD, LDN, CDE Inpatient Diabetes Coordinator [phone]

## 2014-12-30 NOTE — Clinical Social Work Note (Signed)
Clinical Social Work Assessment  Patient Details  Name: Jaclyn Herrera MRN: [id number] Date of Birth: 1946/05/26  Date of referral:  12/30/14               Reason for consult:  Facility Placement, Discharge Planning                Permission sought to share information with:    Permission granted to share information::     Name::        Agency::     Relationship::     Contact Information:     Housing/Transportation Living arrangements for the past 2 months:  Single Family Home Source of Information:  Patient Patient Interpreter Needed:  None Criminal Activity/Legal Involvement Pertinent to Current Situation/Hospitalization:  No - Comment as needed Significant Relationships:  Adult Children, Other Family Members Lives with:  Self Do you feel safe going back to the place where you live?  No (ST Rehab needed.) Need for family participation in patient care:  No (Coment)  Care giving concerns:  Pt's care cannot be managed at home following hospital d/c.   Social Worker assessment / plan:  Pt hospitalized on 12/29/14 with a displaced closed left calcaneal fx which required surgery. CSW met with pt to assist with d/c planning. Pt reports she lives alone and has no family that is able to assist her following hospital d/c. Pt reports she is NWB on left ankle. PT evaluation is pending. ST Rehab may be required. SNF search initiated and bed offers pending. Pt has [facility] / [employer] which requires prior authorization. CSW will assist with authorization process once PT recommendations are available.  Employment status:  Retired Nurse, adult PT Recommendations:  Not assessed at this time [place] / Referral to community resources:  [place]  Patient/Family's Response to care:  Pt feels ST Rehab is needed  Patient/Family's Understanding of and Emotional Response to Diagnosis, Current Treatment, and Prognosis:  Pt is anxious concerning her  medical status and d/c plans. " I have no one to help me and I can't go home alone. " Support and reassurance  provided.  Emotional Assessment Appearance:  Appears stated age Attitude/Demeanor/Rapport:  Other (cooperative) Affect (typically observed):  Anxious Orientation:  Oriented to Self, Oriented to Place, Oriented to  Time, Oriented to Situation Alcohol / Substance use:    Psych involvement (Current and /or in the community):  No (Comment)  Discharge Needs  Concerns to be addressed:  Discharge Planning Concerns Readmission within the last 30 days:  No Current discharge risk:  None Barriers to Discharge:  No Barriers Identified   Luretha Rued, [place] 12/30/2014, 11:30 AM

## 2014-12-30 NOTE — Progress Notes (Addendum)
Clinical Social Work  CSW met with patient at bedside who chose [facility] for SNF. CSW spoke with [facility] who is agreeable to accept patient. CSW faxed clinicals to [place] [facility] and left a message with representative Mendel Ryder). CSW will continue to follow.  Sindy Messing, LCSW (Coverage for [facility])  Addendum 1525 CSW received return call from insurance who reports they are aware of authorization needed. Insurance will call back tomorrow after reviewing information.

## 2014-12-30 NOTE — Progress Notes (Signed)
Pharmacy - Humulin 500units/ml  Spoke with Dr. Karleen Hampshire about resuming Jaclyn Herrera's U-500 insulin. I verified the volume she draws up using a regular insulin syringe and documented it correctly on the med history.  Since the patient uses it on a sliding scale at home, Dr. Karleen Hampshire decided to resume it at the lowest dose of the range the patient reports using. Since this dose is [id number], [place] Policy is to substitute Novolog. Dr. Karleen Hampshire and the patient are agreeable to this.  The patient had already eaten dinner but her glucose has been high so we will start it tonight at 20:00 with follow-up CBGs q4h. I communicated this with the evening nurse.   Romeo Rabon, PharmD, pager [phone]. 12/30/2014,7:35 PM.

## 2014-12-30 NOTE — Evaluation (Signed)
Physical Therapy Evaluation Patient Details Name: Jaclyn Herrera MRN: [id number] DOB: January 07, 1946 Today's Date: 12/30/2014   History of Present Illness  Pt is a 69 year old female s/p ORIF left calcaneus fracture  Clinical Impression  Patient is s/p above surgery resulting in functional limitations due to the deficits listed below (see PT Problem List).  Patient will benefit from skilled PT to increase their independence and safety with mobility to allow discharge to the venue listed below.  Pt only agreeable to sit EOB today however discussed progressing to transfers and w/c mobility.  Pt aware of L LE NWB status and very agreeable to SNF upon d/c since she lives alone.     Follow Up Recommendations SNF    Equipment Recommendations  Wheelchair (measurements PT)    Recommendations for Other Services       Precautions / Restrictions Precautions Precautions: Fall Restrictions Weight Bearing Restrictions: Yes LLE Weight Bearing: Non weight bearing      Mobility  Bed Mobility Overal bed mobility: Needs Assistance Bed Mobility: Supine to Sit;Sit to Supine     Supine to sit: Min assist Sit to supine: Min guard   General bed mobility comments: some assist for L LE support, pt tolerated sitting EOB for 4 minutes then requested return to supine  Transfers Overall transfer level:  (pt declined further mobility today however discussed likely transfer to recliner/w/c next visit)                  Ambulation/Gait                Stairs            Wheelchair Mobility    Modified Rankin (Stroke Patients Only)       Balance                                             Pertinent Vitals/Pain Pain Assessment: 0-10 Pain Score: 8  Pain Location: L LE Pain Descriptors / Indicators: Sharp;Stabbing Pain Intervention(s): Limited activity within patient's tolerance;Monitored during session;Premedicated before session;Repositioned;Ice applied     Home Living Family/patient expects to be discharged to:: Skilled nursing facility Living Arrangements: Alone                    Prior Function Level of Independence: Independent               Hand Dominance        Extremity/Trunk Assessment               Lower Extremity Assessment: LLE deficits/detail   LLE Deficits / Details: able to assist some with bed mobility, supported L LE for pain     Communication   Communication: No difficulties  Cognition Arousal/Alertness: Awake/alert Behavior During Therapy: WFL for tasks assessed/performed Overall Cognitive Status: Within Functional Limits for tasks assessed                      General Comments      Exercises        Assessment/Plan    PT Assessment Patient needs continued PT services  PT Diagnosis Difficulty walking;Generalized weakness   PT Problem List Decreased strength;Decreased mobility;Decreased knowledge of use of DME;Pain  PT Treatment Interventions DME instruction;Balance training;Wheelchair mobility training;Patient/family education;Functional mobility training;Therapeutic activities;Therapeutic exercise   PT Goals (Current goals can be  found in the Care Plan section) Acute Rehab PT Goals PT Goal Formulation: With patient Time For Goal Achievement: 01/06/15 Potential to Achieve Goals: Good    Frequency Min 3X/week   Barriers to discharge        Co-evaluation               End of Session   Activity Tolerance: Patient limited by pain Patient left: in bed;with call bell/phone within reach;with bed alarm set           Time: 1330-1346 PT Time Calculation (min) (ACUTE ONLY): 16 min   Charges:   PT Evaluation $Initial PT Evaluation Tier I: 1 Procedure     PT G Codes:        Serenah Mill,KATHrine E 12/30/2014, 1:57 PM Zenovia Jarred, PT, DPT 12/30/2014 Pager: [phone]

## 2014-12-30 NOTE — Clinical Social Work Placement (Signed)
   CLINICAL SOCIAL WORK PLACEMENT  NOTE  Date:  12/30/2014  Patient Details  Name: Jaclyn Herrera MRN: [id number] Date of Birth: [birth year]  Clinical Social Work is seeking post-discharge placement for this patient at the [place] level of care (*CSW will initial, date and re-position this form in  chart as items are completed):  Yes   Patient/family provided with [place] Work Department's list of facilities offering this level of care within the geographic area requested by the patient (or if unable, by the patient's family).  Yes   Patient/family informed of their freedom to choose among providers that offer the needed level of care, that participate in Medicare, Medicaid or managed care program needed by the patient, have an available bed and are willing to accept the patient.  Yes   Patient/family informed of [place]'s ownership interest in [facility] and [facility], as well as of the fact that they are under no obligation to receive care at these facilities.  PASRR submitted to EDS on 12/30/14     PASRR number received on 12/30/14     Existing PASRR number confirmed on       FL2 transmitted to all facilities in geographic area requested by pt/family on 12/30/14     FL2 transmitted to all facilities within larger geographic area on       Patient informed that his/her managed care company has contracts with or will negotiate with certain facilities, including the following:            Patient/family informed of bed offers received.  Patient chooses bed at       Physician recommends and patient chooses bed at      Patient to be transferred to   on  .  Patient to be transferred to facility by       Patient family notified on   of transfer.  Name of family member notified:        PHYSICIAN       Additional Comment:    _______________________________________________ Luretha Rued, [place] [phone] 12/30/2014,  11:41 AM

## 2014-12-30 NOTE — Progress Notes (Signed)
OT Cancellation Note  Patient Details Name: Jaclyn Herrera MRN: [id number] DOB: [birth year]   Cancelled Treatment:    Reason Eval/Treat Not Completed: Other (comment).  Pt has not been up with PT yet.  Will check later today or tomorrow.  Djuan Talton 12/30/2014, 1:47 PM  Lesle Chris, OTR/L [phone] 12/30/2014

## 2014-12-30 NOTE — Progress Notes (Signed)
Nutrition Brief Note  Patient identified on the Malnutrition Screening Tool (MST) Report  Pt's weight stable. Pt reports a good appetite, noted 75% of breakfast eaten.  Wt Readings from Last 15 Encounters:  12/29/14 220 lb (99.791 kg)  10/31/14 225 lb (102.059 kg)  09/30/14 220 lb 7.4 oz (100 kg)  07/14/13 224 lb (101.606 kg)  05/30/13 226 lb (102.513 kg)  05/22/13 228 lb (103.42 kg)  04/30/13 227 lb (102.967 kg)  02/07/11 253 lb (114.76 kg)  12/18/08 244 lb 4 oz (110.791 kg)    Body mass index is 33.46 kg/(m^2). Patient meets criteria for obesity based on current BMI.   Current diet order is CHO modified, patient is consuming approximately 75% of meals at this time. Labs and medications reviewed.   No nutrition interventions warranted at this time. If nutrition issues arise, please consult RD.   Clayton Bibles, MS, RD, LDN Pager: [phone] After Hours Pager: [phone]

## 2014-12-30 NOTE — Progress Notes (Signed)
Pt requested cell phone from belongings in security. Secretary Lovey Newcomer retrieved belongings envelope from security. Sec. Sandy and RN Curt Bears in room with pt when cellphone given to her. All other contents of envelope accounted for and verified by pt, Lovey Newcomer, and [employer]. Sandy returned remaining belongings to security.

## 2014-12-30 NOTE — Care Management Note (Signed)
Case Management Note  Patient Details  Name: AIRIELLE PLACKE MRN: [id number] Date of Birth: 1945-09-06  Subjective/Objective:                   OPEN REDUCTION INTERNAL FIXATION LEFT CALCANEOUS FRACTURE (Left) Action/Plan: Discharge planning Expected Discharge Date:   (unknown)               Expected Discharge Plan:  [place]  In-House Referral:     Discharge planning Services  CM Consult  Post Acute Care Choice:    Choice offered to:     DME Arranged:    DME Agency:     HH Arranged:    [place] Agency:     Status of Service:  Completed, signed off  Medicare Important Message Given:    Date Medicare IM Given:    Medicare IM give by:    Date Additional Medicare IM Given:    Additional Medicare Important Message give by:     If discussed at [place] of Stay Meetings, dates discussed:    Additional Comments: CM notes pt to go to SNF; CSW arranging.  No other CM needs were communicated. Dellie Catholic, RN 12/30/2014, 10:52 AM

## 2014-12-30 NOTE — Progress Notes (Signed)
[facility] PROGRESS NOTE  Jaclyn Herrera [id number] DOB: 06-04-1946 DOA: Jan 20, 2015 PCP: Asencion Noble, MD  Assessment/Plan: 1. Type 2 Diabetes Mellitus: -  CBG (last 3)   Recent Labs  12/30/14 0715 12/30/14 1410 12/30/14 1744  GLUCAP 213* 363* 333*   would resume her home in addition to her SSI.  Discussed with pharmacy.   Hypertension: Controlled.    Left calcaneal fracture: further management as per orthopedics.    Code Status: full code.  Family Communication: none at bedside.  Disposition Plan: pending.     Procedures: OPEN REDUCTION INTERNAL FIXATION LEFT CALCANEOUS FRACTURE (Left)  Antibiotics:  none  HPI/Subjective: Frustrated. Reports increased pain in the ankle.   Objective: Filed Vitals:   12/30/14 0409  BP: 140/52  Pulse: 69  Temp: 98.4 F (36.9 C)  Resp: 14    Intake/Output Summary (Last 24 hours) at 12/30/14 1854 Last data filed at 12/30/14 0600  Gross per 24 hour  Intake 2127.5 ml  Output   1020 ml  Net 1107.5 ml   Filed Weights   01/20/2015 2058  Weight: 99.791 kg (220 lb)    Exam:   General:  Alert afebrile comfortable  Cardiovascular: s1s2  Respiratory: ctab  Abdomen: soft NT nd bs+  Musculoskeletal: left ankle in bandage.   Data Reviewed: Basic Metabolic Panel:  Recent Labs Lab 2015-01-20 1538 2015-01-20 2250 12/30/14 1148  NA 134*  --  136  K 5.2*  --  4.0  CL 105  --  102  CO2 22  --  25  GLUCOSE 515*  --  345*  BUN 20  --  16  CREATININE 0.79 0.84 0.81  CALCIUM 8.7*  --  8.2*   Liver Function Tests:  Recent Labs Lab 2015/01/20 1538  AST 14*  ALT 15  ALKPHOS 134*  BILITOT 2.1*  PROT 7.2  ALBUMIN 4.3   No results for input(s): LIPASE, AMYLASE in the last 168 hours. No results for input(s): AMMONIA in the last 168 hours. CBC:  Recent Labs Lab 01-20-2015 1538 2015-01-20 2250  WBC 7.2 7.3  HGB 11.0* 11.2*  HCT 33.3* 33.7*  MCV 81.6 83.0  PLT 144* 151   Cardiac Enzymes: No results for  input(s): CKTOTAL, CKMB, CKMBINDEX, TROPONINI in the last 168 hours. BNP (last 3 results) No results for input(s): BNP in the last 8760 hours.  ProBNP (last 3 results) No results for input(s): PROBNP in the last 8760 hours.  CBG:  Recent Labs Lab 12/30/14 0006 12/30/14 0404 12/30/14 0715 12/30/14 1410 12/30/14 1744  GLUCAP 288* 160* 213* 363* 333*    No results found for this or any previous visit (from the past 240 hour(s)).   Studies: Dg Ankle Complete Left  2015-01-20   CLINICAL DATA:  Pain, swelling, and bruising of the left foot and ankle secondary to a twisting injury.  EXAM: LEFT ANKLE COMPLETE - 3+ VIEW  COMPARISON:  None.  FINDINGS: There is a fracture through the posterior superior aspect of the calcaneus. The fragment is approximately 3.7 x 1.6 cm and includes the Achilles insertion. The posterior aspect of the fracture is distracted 2 cm.  There is diffuse soft tissue swelling. Prominent plantar calcaneal osteophyte.  IMPRESSION: Fracture of the posterior superior aspect of the calcaneus with displacement.   Electronically Signed   By: Lorriane Shire M.D.   On: January 20, 2015 13:37   Dg Os Calcis Left  01-20-2015   CLINICAL DATA:  Calcaneal fracture  EXAM: LEFT OS CALCIS - 2+ VIEW  COMPARISON:  Radiographs from earlier the same day  FINDINGS: Two fluoroscopic spot images document fixation of the calcaneal tuberosity fracture with 2 orthopedic screws, fragments in near anatomic alignment.  IMPRESSION: 1. Internal fixation of calcaneal tuberosity fracture in near anatomic alignment.   Electronically Signed   By: Lucrezia Europe M.D.   On: 12/29/2014 19:48   Dg Foot Complete Left  12/29/2014   CLINICAL DATA:  Rolled left foot and ankle with pain bruising left heel  EXAM: LEFT FOOT - COMPLETE 3+ VIEW  COMPARISON:  None.  FINDINGS: Diffuse osteopenia. Widely separated fracture involving the calcaneus tuberosity. Superior fracture fragment is displaced nearly 2 cm proximally, suggesting  injury involving Achilles tendon. There is a small heel spur as well.  IMPRESSION: Acute fracture calcaneus tuberosity.   Electronically Signed   By: Skipper Cliche M.D.   On: 12/29/2014 13:41   Dg C-arm 1-60 Min-no Report  12/29/2014   CLINICAL DATA: surgery   C-ARM 1-60 MINUTES  Fluoroscopy was utilized by the requesting physician.  No radiographic  interpretation.     Scheduled Meds: . amLODipine  5 mg Oral Daily  . amoxicillin-clavulanate  1 tablet Oral BID  . docusate sodium  100 mg Oral BID  . enoxaparin (LOVENOX) injection  40 mg Subcutaneous Q24H  . fluticasone  2 spray Each Nare Daily  . insulin aspart  0-15 Units Subcutaneous 6 times per day  . levothyroxine  125 mcg Oral Once per day on Mon Tue Wed Thu Fri Sat   And  . [START ON 01/03/2015] levothyroxine  187.5 mcg Oral Once per day on Sun  . loratadine  10 mg Oral Daily  . meclizine  25 mg Oral BID  . pantoprazole  80 mg Oral Q1200  . PARoxetine  30 mg Oral Daily  . simvastatin  20 mg Oral QHS   Continuous Infusions: . sodium chloride 75 mL/hr at 12/29/14 2226    Principal Problem:   Diabetes mellitus Active Problems:   Essential hypertension   HLD (hyperlipidemia)   Depression   GERD (gastroesophageal reflux disease)   Hypothyroid   Calcaneal fracture   Calcaneus fracture    Time spent: 30 minutes.     [facility] Pager [phone] If 7PM-7AM, please contact night-coverage at [email or web address], password [facility] 12/30/2014, 6:54 PM  LOS: 1 day

## 2014-12-30 NOTE — Progress Notes (Signed)
Utilization review completed.  

## 2014-12-31 DIAGNOSIS — S92009A Unspecified fracture of unspecified calcaneus, initial encounter for closed fracture: Secondary | ICD-10-CM

## 2014-12-31 LAB — CBC
HCT: 30.7 % — ABNORMAL LOW (ref 36.0–46.0)
Hemoglobin: 10.2 g/dL — ABNORMAL LOW (ref 12.0–15.0)
MCH: 27 pg (ref 26.0–34.0)
MCHC: 33.2 g/dL (ref 30.0–36.0)
MCV: 81.2 fL (ref 78.0–100.0)
Platelets: 137 10*3/uL — ABNORMAL LOW (ref 150–400)
RBC: 3.78 MIL/uL — AB (ref 3.87–5.11)
RDW: 15.4 % (ref 11.5–15.5)
WBC: 7.7 10*3/uL (ref 4.0–10.5)

## 2014-12-31 LAB — GLUCOSE, CAPILLARY
GLUCOSE-CAPILLARY: 179 mg/dL — AB (ref 65–99)
GLUCOSE-CAPILLARY: 341 mg/dL — AB (ref 65–99)
Glucose-Capillary: 68 mg/dL (ref 65–99)
Glucose-Capillary: 81 mg/dL (ref 65–99)
Glucose-Capillary: 198 mg/dL — ABNORMAL HIGH (ref 65–99)
Glucose-Capillary: 146 mg/dL — ABNORMAL HIGH (ref 65–99)
Glucose-Capillary: 311 mg/dL — ABNORMAL HIGH (ref 65–99)

## 2014-12-31 LAB — HEMOGLOBIN A1C
HEMOGLOBIN A1C: 7.6 % — AB (ref 4.8–5.6)
Mean Plasma Glucose: 171 mg/dL

## 2014-12-31 MED ORDER — INSULIN ASPART 100 UNIT/ML ~~LOC~~ SOLN: 50.0000 [IU] | Freq: Three times a day (TID) | SUBCUTANEOUS | Status: DC

## 2014-12-31 MED ORDER — INSULIN GLARGINE 100 UNIT/ML ~~LOC~~ SOLN
20.0000 [IU] | Freq: Every day | SUBCUTANEOUS | Status: DC
  Administered 2014-12-31: 20 [IU] via SUBCUTANEOUS
  Filled 2014-12-31 (×2): qty 0.2

## 2014-12-31 NOTE — Progress Notes (Signed)
Physical Therapy Treatment Patient Details Name: Jaclyn Herrera MRN: [id number] DOB: 1945/08/23 Today's Date: 12/31/2014    History of Present Illness Pt is a 69 year old female s/p ORIF left calcaneus fracture    PT Comments    Patient is mobilizing well in bed, should do well with WC transfers , patient is strong in upper body.  Follow Up Recommendations  SNF     Equipment Recommendations  Wheelchair (measurements PT)    Recommendations for Other Services       Precautions / Restrictions Precautions Precautions: Fall Restrictions LLE Weight Bearing: Non weight bearing    Mobility  Bed Mobility   Bed Mobility: Supine to Sit;Sit to Supine     Supine to sit: Min guard Sit to supine: Min guard   General bed mobility comments: cues for safety, patient tends to lean to side very far.  Transfers                 General transfer comment: NT,   Ambulation/Gait                 Stairs            Wheelchair Mobility    Modified Rankin (Stroke Patients Only)       Balance Overall balance assessment: Needs assistance Sitting-balance support: Feet supported;No upper extremity supported Sitting balance-Leahy Scale: Good Sitting balance - Comments: patient is able to lean to L and R very well to weight shift for pad to be changed.                            Cognition Arousal/Alertness: Awake/alert Behavior During Therapy: Impulsive;WFL for tasks assessed/performed Overall Cognitive Status: Within Functional Limits for tasks assessed                      Exercises      General Comments        Pertinent Vitals/Pain Pain Score: 5  Pain Location: L foot Pain Descriptors / Indicators: Aching;Burning Pain Intervention(s): Monitored during session;Premedicated before session    Home Living                      Prior Function            PT Goals (current goals can now be found in the care plan section)  Progress towards PT goals: Progressing toward goals    Frequency  Min 3X/week    PT Plan Current plan remains appropriate    Co-evaluation PT/OT/SLP Co-Evaluation/Treatment: Yes Reason for Co-Treatment: For patient/therapist safety PT goals addressed during session: Mobility/safety with mobility       End of Session   Activity Tolerance: Patient tolerated treatment well Patient left: in bed;with call bell/phone within reach     Time: 1038-1105 PT Time Calculation (min) (ACUTE ONLY): 27 min  Charges:  $Therapeutic Activity: 8-22 mins                    G Codes:      Claretha Cooper 12/31/2014, 12:16 PM Tresa Endo PT [phone]

## 2014-12-31 NOTE — Progress Notes (Signed)
Blood glucose at 0404 was 68. Pt given 8 oz of orange juice. Rechecked blood glucose at 0445 increased to 81. Will continue to monitor.

## 2014-12-31 NOTE — Evaluation (Signed)
Occupational Therapy Evaluation Patient Details Name: Jaclyn Herrera MRN: [id number] DOB: 1946/04/25 Today's Date: 12/31/2014    History of Present Illness Pt is a 69 year old female s/p ORIF left calcaneus fracture   Clinical Impression   Pt was admitted due to the above injury/surgery.  She will benefit from skilled OT to increase independence with adls.  Initial goals will be seated/scooting level at supervision level and will upgrade as appropriate.  Pt is NWB on LLE.    Follow Up Recommendations  SNF    Equipment Recommendations   (to be further assessed, 3:1 vs drop arm 3:1)    Recommendations for Other Services       Precautions / Restrictions Precautions Precautions: Fall Restrictions LLE Weight Bearing: Non weight bearing      Mobility Bed Mobility   Bed Mobility: Supine to Sit;Sit to Supine     Supine to sit: Min guard Sit to supine: Min guard   General bed mobility comments: cues for safety, patient tends to lean to side very far.  Transfers                 General transfer comment: NT,     Balance Overall balance assessment: Needs assistance Sitting-balance support: Feet supported;No upper extremity supported Sitting balance-Leahy Scale: Good Sitting balance - Comments: patient is able to lean to L and R very well to weight shift for pad to be changed.                                    ADL Overall ADL's : Needs assistance/impaired     Grooming: Wash/dry hands;Wash/dry face;Sitting;Set up   Upper Body Bathing: Set up;Sitting   Lower Body Bathing: Minimal assistance;Sitting/lateral leans   Upper Body Dressing : Set up;Sitting   Lower Body Dressing: Moderate assistance;Sitting/lateral leans                 General ADL Comments: sat EOB and performed bathing.  Unit secretary to request cotton gown due to sensitive skin.  Min guard for safety leaning as pt moves quickly.  Able to assist with scooting up in bed.  Pt  states that she does not want to use walker. She would feel more comfortable with crutches when she gets up     Vision     Perception     Praxis      Pertinent Vitals/Pain Pain Score: 5  Pain Location: L foot Pain Descriptors / Indicators: Aching Pain Intervention(s): Limited activity within patient's tolerance;Monitored during session;Premedicated before session;Repositioned     Hand Dominance     Extremity/Trunk Assessment Upper Extremity Assessment Upper Extremity Assessment: Overall WFL for tasks assessed           Communication Communication Communication: No difficulties   Cognition Arousal/Alertness: Awake/alert Behavior During Therapy: Impulsive;WFL for tasks assessed/performed Overall Cognitive Status: Within Functional Limits for tasks assessed                     General Comments       Exercises       Shoulder Instructions      Home Living Family/patient expects to be discharged to:: Skilled nursing facility  Prior Functioning/Environment Level of Independence: Independent             OT Diagnosis: Acute pain   OT Problem List: Decreased strength;Decreased activity tolerance;Decreased knowledge of use of DME or AE;Pain   OT Treatment/Interventions: Self-care/ADL training;DME and/or AE instruction;Patient/family education;Therapeutic activities    OT Goals(Current goals can be found in the care plan section) Acute Rehab OT Goals Patient Stated Goal: decreased pain; get back to being independent OT Goal Formulation: With patient Time For Goal Achievement: 01/07/15 Potential to Achieve Goals: Good ADL Goals Pt Will Perform Lower Body Bathing: with supervision;with adaptive equipment;sitting/lateral leans Pt Will Perform Lower Body Dressing: with supervision;with adaptive equipment;sitting/lateral leans Pt Will Transfer to Toilet: with min assist;bedside commode (lateral scoot  with drop arm commode)  OT Frequency: Min 2X/week   Barriers to D/C:            Co-evaluation PT/OT/SLP Co-Evaluation/Treatment: Yes Reason for Co-Treatment: For patient/therapist safety PT goals addressed during session: Mobility/safety with mobility OT goals addressed during session: ADL's and self-care      End of Session    Activity Tolerance: Patient tolerated treatment well Patient left: in bed;with call bell/phone within reach   Time: 1610-9604 OT Time Calculation (min): 25 min Charges:  OT General Charges $OT Visit: 1 Procedure OT Evaluation $Initial OT Evaluation Tier I: 1 Procedure G-Codes:    Hayley Horn 01-16-2015, 12:46 PM  Marica Otter, OTR/L [phone] 2015/01/16

## 2014-12-31 NOTE — Care Management Important Message (Signed)
Important Message  Patient Details  Name: Jaclyn Herrera MRN: [id number] Date of Birth: November 13, 1945   Medicare Important Message Given:  [facility] notification given    Camillo Flaming 12/31/2014, 11:52 AMImportant Message  Patient Details  Name: Jaclyn Herrera MRN: [id number] Date of Birth: Apr 02, 1946   Medicare Important Message Given:  Yes-second notification given    Camillo Flaming 12/31/2014, 11:52 AM

## 2014-12-31 NOTE — Progress Notes (Signed)
LATE ENTRY NOTE Date of Service of Visit - Wednesday 12/30/2014    Subjective: 1 Days Post-Op Procedure(s) (LRB): OPEN REDUCTION INTERNAL FIXATION  LEFT CALCANEOUS FRACTURE (Left) Patient reports pain as moderate.   Patient seen in rounds for Dr. Wynelle Link. Patient is having problems with pain in the ankle and foot, requiring pain medications We will start therapy today.  Plan is to go Skilled nursing facility after hospital stay.  Objective: Vital signs in last 24 hours: Temp:  [99 F (37.2 C)-99.6 F (37.6 C)] 99 F (37.2 C) (07/21 0540) Pulse Rate:  [77-86] 86 (07/21 0540) Resp:  [14-15] 15 (07/21 0540) BP: (150-162)/(50) 150/50 mmHg (07/21 0540) SpO2:  [97 %] 97 % (07/21 0540)  Intake/Output from previous day: 07/20 0701 - 07/21 0700 In: 420 [P.O.:420] Out: 2000 [Urine:2000]   Recent Labs  12/29/14 1538 12/29/14 2250  HGB 11.0* 11.2*    Recent Labs  12/29/14 1538 12/29/14 2250  WBC 7.2 7.3  RBC 4.08 4.06  HCT 33.3* 33.7*  PLT 144* 151    Recent Labs  12/29/14 1538 12/29/14 2250   NA 134*  --    K 5.2*  --    CL 105  --    CO2 22  --    BUN 20  --    CREATININE 0.79 0.84   GLUCOSE 515*  --    CALCIUM 8.7*  --      Recent Labs  12/29/14 1538  INR 1.10    EXAM General - Patient is Alert and Appropriate Extremity - Neurovascular intact Sensation intact distally Splint - dressing C/D/I, splint in good condition and elevated on pillows Motor Function - intact, moving foot and toes well on exam.   Past Medical History  Diagnosis Date  . Diabetes mellitus   . Hypertension   . Arthritis   . Shingles   . hypothyroidism   . Bronchitis   . Hyperlipidemia   . GERD (gastroesophageal reflux disease)   . PONV (postoperative nausea and vomiting)     Assessment/Plan: 2 Days Post-Op Procedure(s) (LRB): OPEN REDUCTION INTERNAL FIXATION  LEFT CALCANEOUS FRACTURE (Left) Principal Problem:   Diabetes mellitus Active Problems:   Essential  hypertension   HLD (hyperlipidemia)   Depression   GERD (gastroesophageal reflux disease)   Hypothyroid   Calcaneal fracture   Calcaneus fracture  Estimated body mass index is 33.46 kg/(m^2) as calculated from the following:   Height as of this encounter: 5\' 8"  (1.727 m).   Weight as of this encounter: 99.791 kg (220 lb). Up OOB to chair today  DVT Prophylaxis - Lovenox Non weight bearing to left foot and leg  Arlee Muslim, PA-C Orthopaedic Surgery 12/31/2014, 7:11 AM

## 2014-12-31 NOTE — Progress Notes (Signed)
   Subjective: 2 Days Post-Op Procedure(s) (LRB): OPEN REDUCTION INTERNAL FIXATION  LEFT CALCANEOUS FRACTURE (Left) Patient reports pain as mild and moderate.   Patient seen in rounds with Dr. Wynelle Link. Complaints of burning on the heel of left foot. Patient is well, but has had some minor complaints of pain in the foot and ankle, requiring pain medications We will resume therapy today.  Plan is to go Skilled nursing facility after hospital stay.  Objective: Vital signs in last 24 hours: Temp:  [99 F (37.2 C)-99.6 F (37.6 C)] 99 F (37.2 C) (07/21 0540) Pulse Rate:  [77-86] 86 (07/21 0540) Resp:  [14-15] 15 (07/21 0540) BP: (150-162)/(50) 150/50 mmHg (07/21 0540) SpO2:  [97 %] 97 % (07/21 0540)  Intake/Output from previous day: 07/20 0701 - 07/21 0700 In: 420 [P.O.:420] Out: 2000 [Urine:2000]  Recent Labs  12/29/14 1538 12/29/14 2250  HGB 11.0* 11.2*    Recent Labs  12/29/14 1538 12/29/14 2250  WBC 7.2 7.3  RBC 4.08 4.06  HCT 33.3* 33.7*  PLT 144* 151    Recent Labs  12/29/14 1538 12/29/14 2250 12/30/14 1148  NA 134*  --  136  K 5.2*  --  4.0  CL 105  --  102  CO2 22  --  25  BUN 20  --  16  CREATININE 0.79 0.84 0.81  GLUCOSE 515*  --  345*  CALCIUM 8.7*  --  8.2*    Recent Labs  12/29/14 1538  INR 1.10   EXAM General - Patient is Alert and Appropriate Extremity - Neurovascular intact Sensation intact distally Dressing/Splint - dressing C/D/I, splint in good condition and elevated on pillows Motor Function - intact, moving toes well on exam.   Past Medical History  Diagnosis Date  . Diabetes mellitus   . Hypertension   . Arthritis   . Shingles   . hypothyroidism   . Bronchitis   . Hyperlipidemia   . GERD (gastroesophageal reflux disease)   . PONV (postoperative nausea and vomiting)    Assessment/Plan: 2 Days Post-Op Procedure(s) (LRB): OPEN REDUCTION INTERNAL FIXATION  LEFT CALCANEOUS FRACTURE (Left) Principal Problem:   Diabetes  mellitus Active Problems:   Essential hypertension   HLD (hyperlipidemia)   Depression   GERD (gastroesophageal reflux disease)   Hypothyroid   Calcaneal fracture   Calcaneus fracture  Estimated body mass index is 33.46 kg/(m^2) as calculated from the following:   Height as of this encounter: 5\' 8"  (1.727 m).   Weight as of this encounter: 99.791 kg (220 lb). Up with therapy Plan for discharge tomorrow Discharge to SNF  DVT Prophylaxis - Lovenox Non weight bearing to left foot and leg  Arlee Muslim, PA-C Orthopaedic Surgery 12/31/2014, 7:15 AM

## 2014-12-31 NOTE — Progress Notes (Signed)
Blood sugar taken at 12:00 was 146. I spoke with Dr.Akula, MD ordered to hold the 50units of Novolog order one time and only provide the sliding scale dose.

## 2014-12-31 NOTE — Progress Notes (Signed)
[facility] PROGRESS NOTE  Jaclyn Herrera [id number] DOB: 28-Jul-1945 DOA: 12/29/2014 PCP: Asencion Noble, MD  Assessment/Plan: 1. Type 2 Diabetes Mellitus: -  CBG (last 3)   Recent Labs  12/31/14 0741 12/31/14 1210 12/31/14 1648  GLUCAP 198* 146* 179*   START LANTUS  20 units and resume SSI.  Discussed with pharmacy.   Hypertension: Controlled.    Left calcaneal fracture: further management as per orthopedics.    Code Status: full code.  Family Communication: none at bedside.  Disposition Plan: pending.     Procedures: OPEN REDUCTION INTERNAL FIXATION LEFT CALCANEOUS FRACTURE (Left)  Antibiotics:  none  HPI/Subjective: FEELING BETTER TODAY.  Objective: Filed Vitals:   12/31/14 1437  BP: 145/50  Pulse: 88  Temp: 98.9 F (37.2 C)  Resp: 16    Intake/Output Summary (Last 24 hours) at 12/31/14 1731 Last data filed at 12/31/14 1437  Gross per 24 hour  Intake    840 ml  Output   2000 ml  Net  -1160 ml   Filed Weights   12/29/14 2058  Weight: 99.791 kg (220 lb)    Exam:   General:  Alert afebrile comfortable  Cardiovascular: s1s2  Respiratory: ctab  Abdomen: soft NT nd bs+  Musculoskeletal: left ankle in bandage.   Data Reviewed: Basic Metabolic Panel:  Recent Labs Lab 12/29/14 1538 12/29/14 2250 12/30/14 1148  NA 134*  --  136  K 5.2*  --  4.0  CL 105  --  102  CO2 22  --  25  GLUCOSE 515*  --  345*  BUN 20  --  16  CREATININE 0.79 0.84 0.81  CALCIUM 8.7*  --  8.2*   Liver Function Tests:  Recent Labs Lab 12/29/14 1538  AST 14*  ALT 15  ALKPHOS 134*  BILITOT 2.1*  PROT 7.2  ALBUMIN 4.3   No results for input(s): LIPASE, AMYLASE in the last 168 hours. No results for input(s): AMMONIA in the last 168 hours. CBC:  Recent Labs Lab 12/29/14 1538 12/29/14 2250 12/31/14 1202  WBC 7.2 7.3 7.7  HGB 11.0* 11.2* 10.2*  HCT 33.3* 33.7* 30.7*  MCV 81.6 83.0 81.2  PLT 144* 151 137*   Cardiac Enzymes: No results  for input(s): CKTOTAL, CKMB, CKMBINDEX, TROPONINI in the last 168 hours. BNP (last 3 results) No results for input(s): BNP in the last 8760 hours.  ProBNP (last 3 results) No results for input(s): PROBNP in the last 8760 hours.  CBG:  Recent Labs Lab 12/31/14 0402 12/31/14 0443 12/31/14 0741 12/31/14 1210 12/31/14 1648  GLUCAP 68 81 198* 146* 179*    No results found for this or any previous visit (from the past 240 hour(s)).   Studies: Dg Os Calcis Left  12/29/2014   CLINICAL DATA:  Calcaneal fracture  EXAM: LEFT OS CALCIS - 2+ VIEW  COMPARISON:  Radiographs from earlier the same day  FINDINGS: Two fluoroscopic spot images document fixation of the calcaneal tuberosity fracture with 2 orthopedic screws, fragments in near anatomic alignment.  IMPRESSION: 1. Internal fixation of calcaneal tuberosity fracture in near anatomic alignment.   Electronically Signed   By: Lucrezia Europe M.D.   On: 12/29/2014 19:48   Dg C-arm 1-60 Min-no Report  12/29/2014   CLINICAL DATA: surgery   C-ARM 1-60 MINUTES  Fluoroscopy was utilized by the requesting physician.  No radiographic  interpretation.     Scheduled Meds: . amLODipine  5 mg Oral Daily  . amoxicillin-clavulanate  1 tablet Oral  BID  . docusate sodium  100 mg Oral BID  . enoxaparin (LOVENOX) injection  40 mg Subcutaneous Q24H  . fluticasone  2 spray Each Nare Daily  . insulin aspart  0-15 Units Subcutaneous 6 times per day  . insulin glargine  20 Units Subcutaneous QHS  . levothyroxine  125 mcg Oral Once per day on Mon Tue Wed Thu Fri Sat   And  . [START ON 01/03/2015] levothyroxine  187.5 mcg Oral Once per day on Sun  . loratadine  10 mg Oral Daily  . meclizine  25 mg Oral BID  . pantoprazole  80 mg Oral Q1200  . PARoxetine  30 mg Oral Daily  . simvastatin  20 mg Oral QHS   Continuous Infusions: . sodium chloride 10 mL/hr at 12/31/14 0720    Principal Problem:   Diabetes mellitus Active Problems:   Essential hypertension   HLD  (hyperlipidemia)   Depression   GERD (gastroesophageal reflux disease)   Hypothyroid   Calcaneal fracture   Calcaneus fracture    Time spent: 30 minutes.     [place] Pager [phone] If 7PM-7AM, please contact night-coverage at [email or web address], password [facility] 12/31/2014, 5:31 PM  LOS: 2 days

## 2014-12-31 NOTE — Progress Notes (Signed)
I took over patients care at 12:00pm from [employer]. Patient is alert and oriented x4. Irritable at times stating "someone is going to pay for the bruises from what they did to her arm in surgery". Referring to sticking her for an IV too many times, she thinks. Oxycodone 5mg  given to patient twice during shift with good results. Patient was unable to void after foley catheter removal. I spoke with PA who said she may take her lasix after patient stated it helps her urinate. Patient was able to void but urine was unable to be measured due to the female urinal being spilled over per patient. Order for in and out cath once ordered. Placed in and out cath after sterile prep and advanced until 545mL of dark yellow urine was received in return. Patient tolerated but very tender in the vaginal area and red. Also noted reddened "scratch" marks on left buttock.

## 2015-01-01 ENCOUNTER — Inpatient Hospital Stay (HOSPITAL_COMMUNITY): Payer: Medicare Other

## 2015-01-01 LAB — CBC
HCT: 29.8 % — ABNORMAL LOW (ref 36.0–46.0)
HEMOGLOBIN: 10.2 g/dL — AB (ref 12.0–15.0)
MCH: 28 pg (ref 26.0–34.0)
MCHC: 34.2 g/dL (ref 30.0–36.0)
MCV: 81.9 fL (ref 78.0–100.0)
PLATELETS: 142 10*3/uL — AB (ref 150–400)
RBC: 3.64 MIL/uL — AB (ref 3.87–5.11)
RDW: 15.4 % (ref 11.5–15.5)
WBC: 7 10*3/uL (ref 4.0–10.5)

## 2015-01-01 LAB — BASIC METABOLIC PANEL
Anion gap: 8 (ref 5–15)
BUN: 15 mg/dL (ref 6–20)
CHLORIDE: 101 mmol/L (ref 101–111)
CO2: 25 mmol/L (ref 22–32)
CREATININE: 0.78 mg/dL (ref 0.44–1.00)
Calcium: 8.4 mg/dL — ABNORMAL LOW (ref 8.9–10.3)
GFR calc non Af Amer: >60 mL/min (ref >60)
Glucose, Bld: 341 mg/dL — ABNORMAL HIGH (ref 65–99)
Potassium: 3.9 mmol/L (ref 3.5–5.1)
Sodium: 134 mmol/L — ABNORMAL LOW (ref 135–145)

## 2015-01-01 LAB — GLUCOSE, CAPILLARY
GLUCOSE-CAPILLARY: 369 mg/dL — AB (ref 65–99)
Glucose-Capillary: 312 mg/dL — ABNORMAL HIGH (ref 65–99)
Glucose-Capillary: 279 mg/dL — ABNORMAL HIGH (ref 65–99)

## 2015-01-01 MED ORDER — DOCUSATE SODIUM 100 MG PO CAPS: 100.0000 mg | ORAL_CAPSULE | Freq: Two times a day (BID) | ORAL | Status: DC

## 2015-01-01 MED ORDER — OXYCODONE HCL 5 MG PO TABS: 5.0000 mg | ORAL_TABLET | ORAL | Status: DC | PRN

## 2015-01-01 MED ORDER — METOCLOPRAMIDE HCL 5 MG PO TABS: 5.0000 mg | ORAL_TABLET | Freq: Three times a day (TID) | ORAL | Status: DC | PRN

## 2015-01-01 MED ORDER — ENOXAPARIN SODIUM 40 MG/0.4ML ~~LOC~~ SOLN: 40.0000 mg | SUBCUTANEOUS | Status: DC

## 2015-01-01 MED ORDER — ONDANSETRON HCL 4 MG PO TABS: 4.0000 mg | ORAL_TABLET | Freq: Four times a day (QID) | ORAL | Status: DC | PRN

## 2015-01-01 MED ORDER — POLYETHYLENE GLYCOL 3350 17 G PO PACK
17.0000 g | PACK | Freq: Every day | ORAL | Status: DC | PRN
Start: 2015-01-01 — End: 2015-06-22

## 2015-01-01 MED ORDER — METHOCARBAMOL 500 MG PO TABS: 500.0000 mg | ORAL_TABLET | Freq: Four times a day (QID) | ORAL | Status: DC | PRN

## 2015-01-01 MED ORDER — BISACODYL 10 MG RE SUPP: 10.0000 mg | Freq: Every day | RECTAL | Status: DC | PRN

## 2015-01-01 NOTE — Progress Notes (Signed)
Inpatient Diabetes Program Recommendations  AACE/ADA: New Consensus Statement on Inpatient Glycemic Control (2013)  Target Ranges:  Prepandial:   less than 140 mg/dL      Peak postprandial:   less than 180 mg/dL (1-2 hours)      Critically ill patients:  140 - 180 mg/dL   Reason for Visit: Hyperglycemia  Results for Jaclyn Herrera, Jaclyn Herrera (MRN [id number]) as of 01/01/2015 12:39  Ref. Range 12/31/2014 20:02 12/31/2014 23:53 01/01/2015 04:21 01/01/2015 08:17 01/01/2015 11:45  Glucose-Capillary Latest Ref Range: 65-99 mg/dL 311 (H) 341 (H) 312 (H) 279 (H) 369 (H)   Needs increase in basal and Novolog meal coverage.  Inpatient Diabetes Program Recommendations Insulin - Basal: Lantus 25 units bid Correction (SSI): Novolog resistant tidwc and hs Insulin - Meal Coverage: Novolog 15 units tidwc HgbA1C: 9.4% - uncontrolled Diet: CHO mod med  Note: Will likely be discharged to SNF this afternoon. Will be discharged on home dose of her U-500 insulin.  Thank you. Lorenda Peck, RD, LDN, CDE Inpatient Diabetes Coordinator [phone]

## 2015-01-01 NOTE — Progress Notes (Signed)
Per Dr.Akula and PA Arlee Muslim, patient is fine to be discharged to [facility] today. Earlier in shift, patient was irate and yelling concerning "bruises" she obtained, she stated during surgery. Sabino Dick, [facility], has come by and spoke with the patient about the patients concerns and obtaining medical records. She is being discharged to SNF. She is alert and oriented. Taken Oxycodone 5mg  for pain with relief. VS stable. Blood sugar was 369 and received 15 units. Dr.Akula and Arlee Muslim, PA are aware of patients concerns and hand/bruises. No s/s of acute distress. Chest xray done for patient coughing up green mucus. Dr.Akula has reviewed those results. Will call [facility] to provide report.

## 2015-01-01 NOTE — Progress Notes (Signed)
[facility] PROGRESS NOTE  Jaclyn Herrera [id number] DOB: [birth year] DOA: 12/29/2014 PCP: Asencion Noble, MD  Assessment/Plan: 1. Type 2 Diabetes Mellitus: -  CBG (last 3)   Recent Labs  01/01/15 0421 01/01/15 0817 01/01/15 1145  GLUCAP 312* 279* 369*  started. LANTUS  20 units last night and the cbg's are not better. Best thing would be to resume her home insulin on discharge.      Hypertension: Controlled.   Petechiae on the right arm: unclear etiology. Repeated the cbc, minimal decrease in platelet count. No itching, burning sensation associated with it.  Appear to improve.    Bronchitis on augmentin, CXR earlier today does not show any consolidations, and plan to complete the course on discharge.    Left calcaneal fracture: further management as per orthopedics.    Code Status: full code.  Family Communication: none at bedside.  Disposition Plan: pending.     Procedures: OPEN REDUCTION INTERNAL FIXATION LEFT CALCANEOUS FRACTURE (Left)  Antibiotics:  none  HPI/Subjective: FEELING BETTER TODAY.  Objective: Filed Vitals:   01/01/15 1359  BP: 167/51  Pulse: 99  Temp: 99.6 F (37.6 C)  Resp: 18    Intake/Output Summary (Last 24 hours) at 01/01/15 1509 Last data filed at 01/01/15 1359  Gross per 24 hour  Intake    600 ml  Output   2150 ml  Net  -1550 ml   Filed Weights   12/29/14 2058  Weight: 99.791 kg (220 lb)    Exam:   General:  Alert afebrile comfortable  Cardiovascular: s1s2  Respiratory: ctab  Abdomen: soft NT nd bs+  Musculoskeletal: left ankle in bandage.   Data Reviewed: Basic Metabolic Panel:  Recent Labs Lab 12/29/14 1538 12/29/14 2250 12/30/14 1148 01/01/15 0500  NA 134*  --  136 134*  K 5.2*  --  4.0 3.9  CL 105  --  102 101  CO2 22  --  25 25  GLUCOSE 515*  --  345* 341*  BUN 20  --  16 15  CREATININE 0.79 0.84 0.81 0.78  CALCIUM 8.7*  --  8.2* 8.4*   Liver Function Tests:  Recent Labs Lab  12/29/14 1538  AST 14*  ALT 15  ALKPHOS 134*  BILITOT 2.1*  PROT 7.2  ALBUMIN 4.3   No results for input(s): LIPASE, AMYLASE in the last 168 hours. No results for input(s): AMMONIA in the last 168 hours. CBC:  Recent Labs Lab 12/29/14 1538 12/29/14 2250 12/31/14 1202 01/01/15 0500  WBC 7.2 7.3 7.7 7.0  HGB 11.0* 11.2* 10.2* 10.2*  HCT 33.3* 33.7* 30.7* 29.8*  MCV 81.6 83.0 81.2 81.9  PLT 144* 151 137* 142*   Cardiac Enzymes: No results for input(s): CKTOTAL, CKMB, CKMBINDEX, TROPONINI in the last 168 hours. BNP (last 3 results) No results for input(s): BNP in the last 8760 hours.  ProBNP (last 3 results) No results for input(s): PROBNP in the last 8760 hours.  CBG:  Recent Labs Lab 12/31/14 2002 12/31/14 2353 01/01/15 0421 01/01/15 0817 01/01/15 1145  GLUCAP 311* 341* 312* 279* 369*    No results found for this or any previous visit (from the past 240 hour(s)).   Studies: Dg Chest 2 View  01/01/2015   CLINICAL DATA:  Productive cough, recent heel surgery  EXAM: CHEST - 2 VIEW  COMPARISON:  09/29/2014  FINDINGS: Cardiac shadow is stable. The lungs are clear bilaterally. There is increased density in the left lung base best seen on the lateral projection  with associated effusion. This may represent some postoperative atelectasis or early infiltrate.  IMPRESSION: Left basilar changes.  Followup films are recommended.   Electronically Signed   By: Inez Catalina M.D.   On: 01/01/2015 12:39    Scheduled Meds: . amLODipine  5 mg Oral Daily  . amoxicillin-clavulanate  1 tablet Oral BID  . docusate sodium  100 mg Oral BID  . enoxaparin (LOVENOX) injection  40 mg Subcutaneous Q24H  . fluticasone  2 spray Each Nare Daily  . insulin aspart  0-15 Units Subcutaneous 6 times per day  . insulin glargine  20 Units Subcutaneous QHS  . levothyroxine  125 mcg Oral Once per day on Mon Tue Wed Thu Fri Sat   And  . [START ON 01/03/2015] levothyroxine  187.5 mcg Oral Once per day  on Sun  . loratadine  10 mg Oral Daily  . meclizine  25 mg Oral BID  . pantoprazole  80 mg Oral Q1200  . PARoxetine  30 mg Oral Daily  . simvastatin  20 mg Oral QHS   Continuous Infusions: . sodium chloride 10 mL/hr at 12/31/14 0720    Principal Problem:   Diabetes mellitus Active Problems:   Essential hypertension   HLD (hyperlipidemia)   Depression   GERD (gastroesophageal reflux disease)   Hypothyroid   Calcaneal fracture   Calcaneus fracture    Time spent: 30 minutes.     [place] [facility] Pager [phone] If 7PM-7AM, please contact night-coverage at [email or web address], password [facility] 01/01/2015, 3:09 PM  LOS: 3 days

## 2015-01-01 NOTE — Discharge Instructions (Addendum)
Cast Care The purpose of a cast is to protect and immobilize an injured part of the body. This may be necessary after fractures, surgery, or other injuries. Splints are another form of immobilization; however, they are usually not as rigid as a cast, which accommodates swelling of the injury while still maintaining immobilization. Splints are typically used in the immediate post injury or postoperative period, before changing to a cast.  Before the rigid material of a cast is formed, a layer of padding is first applied to protect the injury. The rigid portion of a cast is created by wrapping gauze saturated with plaster of [place] around the injury; alternatively, the cast may be made of fiberglass. During the period of immobilization, a cast may need to be changed multiple times. The length of immobilization is dependent on the severity of the injury and the time needed for healing. This period can vary from a couple weeks to many months. After a cast is created, radiographs (X-rays) through the cast determine if a satisfactory alignment of the bones was achieved. Radiographs may also be used throughout the healing period to check for signs of bone healing.  CARE OF THE CAST   Allow the cast to dry and harden completely before applying any pressure to it.  Applying pressure too early may create a point of excessive pressure on the skin, which may increase the risk of forming an ulcer. Drying time depends on the type of cast but may last as long as 24 hours.  When a cast gets wet, a soft area may appear. If this happens accidentally, return to the doctor's office, emergency room, or outpatient surgical facility as soon as possible for repairs or changing of the cast.  Do not get sand in the cast.  Do not place anything inside the cast. This includes items intended to scratch an area of skin that itches. ITCHING INSIDE A CAST   It is very common for a person with a cast to experience an itching  sensation under the cast.  Do not scratch any area under the cast even if the area is within reach. The skin under a cast in an environment of increased risk for injury.  Do not put anything in the cast.  If there is no wound, such as an incision from surgery, you may sprinkle cornstarch into the cast to relieve itching.  If a wound is present under the cast, consult your caregiver for pain medication or medication to reduce itching.  Using a hairdryer (on the cold setting) is a useful technique for reducing an itching sensation. CARE OF THE PATIENT IN A CAST It is important to elevate the body part that is in a cast to a level equal to or above that of the heart whenever possible. Elevating the injured body part may reduce the likelihood of swelling. Elevation of a leg in a cast may be achieved by resting the leg on a pillow when in bed and on a footstool or chair when sitting. For an arm in a cast, rest the arm on a pillow placed on the chest.  No matter how well one follows the necessary precautions, excessive swelling may occur under the cast. Signs and symptoms of excessive swelling include:  Severe and persistent pain.  Change in color of the tissues beyond the end of the cast, such as a change to blue or gray under the nails of the fingers or toes.  Coldness of the tissues beyond the cast when  the rest of the body is warm.  Numbness or complete loss of feeling in the skin beyond the cast.  Feeling of tightness under the cast after it dries.  Swelling of the tissue to a greater extent than was present before the cast was applied.  For a leg cast, inability to raise the big toe. If any of these signs or symptoms occur, contact your caregiver or an emergency room as soon as possible for treatment.  Infection Inside a Cast On occasion, an injury may become infected during healing. The most important way to fight an infection is to detect it early; however, early detection may be  difficult if the infected area is covered by a cast. Infection should be reported immediately to your caregiver. The following are common signs and symptoms of infection:   Foul smell.  Fever greater than 101F (38.3C) (may be accompanied by a general ill feeling).  Leakage of fluid through the cast.  Increasing pain or soreness of the skin under the cast.    Calcaneal Fracture Repair There are many different ways of treating fractures of the large irregular bone in the foot that makes up the heel of the foot (calcaneus). Calcaneal fractures can be treated with:   Immobilization--The fracture is casted as it is without changing the positions of the fracture involved.   Closed reduction--The bones are manipulated back into position without opening the site of the fracture using surgery.   Open reduction and internal fixation--The fracture site is opened and the bone pieces are fixed into place with some type of hardware (such as a screw).   Primary arthrodesis--The joint has enough damage that a procedure is done as the first treatment which will leave the joint permanently stiff. This will decrease function, however usually will leave the joint pain free. LET [facility] CARE PROVIDER KNOW ABOUT:  Any allergies you have.   All medicines you are taking, including vitamins, herbs, eye drops, creams, and over-the-counter medicines.   Previous problems you or members of your family have had with the use of anesthetics.  Any blood disorders you have.   Previous surgeries you have had.   Medical conditions you have.  RISKS AND COMPLICATIONS Generally, calcaneal fracture repair is a safe procedure. However, as with any procedure, complications can occur. Possible complications include:   Swelling of the foot and ankle.  Infection of the wound or bone.  Arthritis.  Chronic pain of the foot.  Nerve injury.  Blood clot in the legs or lungs. BEFORE THE PROCEDURE  Ask  your health care provider about changing or stopping your regular medicines. You may need to stop taking certain medicines, such as aspirin or blood thinners, at least 1 week before the surgery.  X-rays and any imaging studies are reviewed with your healthcare provider. The surgeon will advise you on the best surgical approach to repair your fracture.  Do not eat or drink anything for at least 8 hours before the surgery or as directed by your health care provider.   If you smoke, do not smoke for at least 2 weeks before the surgery.   Make plans to have someone drive you home after the procedure. Also arrange for someone to help you with activities during recovery.  PROCEDURE   You will be given medicine to help you relax (sedative). You will then be given medicine to make you sleep through the procedure (general anesthetic). These medicines will be given through an IV access tube that  is put into one of your veins.   A nerve block or numbing medicine (local anesthetic) may also be used to keep you comfortable.  Once you are asleep, the foot will be cleaned and shaved if needed.  The surgeon may use a percutaneous or open technique for this surgery:  In the percutaneous approach, small cuts and pins are used to repair the fracture.  In the open technique, a cut is made along the outside of the foot and the bone pieces are placed back together with hardware. A drain may be left to collect fluid. It is removed 3-4 days after the procedure.  The surgeon then uses staples or stitches to close the incision or cuts. AFTER THE PROCEDURE  After surgery you will be taken to the recovery area where a nurse will watch and check your progress for 1-3 hours. Once you are awake, stable, and taking fluids well, and if you do not have any other problems, you will be allowed to go home.  You will be given pain medicine if needed.  The IV access tube will be removed before you are  discharged. Document Released: 03/08/2005 Document Revised: 03/19/2013 Document Reviewed: 12/31/2012 [facility] Patient Information 2015 [place], [place]. This information is not intended to replace advice given to you by your health care provider. Make sure you discuss any questions you have with your health care provider.   Postoperative Constipation Protocol  Constipation - defined medically as fewer than three stools per week and severe constipation as less than one stool per week.  One of the most common issues patients have following surgery is constipation.  Even if you have a regular bowel pattern at home, your normal regimen is likely to be disrupted due to multiple reasons following surgery.  Combination of anesthesia, postoperative narcotics, change in appetite and fluid intake all can affect your bowels.  In order to avoid complications following surgery, here are some recommendations in order to help you during your recovery period.  Colace (docusate) - Pick up an over-the-counter form of Colace or another stool softener and take twice a day as long as you are requiring postoperative pain medications.  Take with a full glass of water daily.  If you experience loose stools or diarrhea, hold the colace until you stool forms back up.  If your symptoms do not get better within 1 week or if they get worse, check with your doctor.  Dulcolax (bisacodyl) - Pick up over-the-counter and take as directed by the product packaging as needed to assist with the movement of your bowels.  Take with a full glass of water.  Use this product as needed if not relieved by Colace only.   MiraLax (polyethylene glycol) - Pick up over-the-counter to have on hand.  MiraLax is a solution that will increase the amount of water in your bowels to assist with bowel movements.  Take as directed and can mix with a glass of water, juice, soda, coffee, or tea.  Take if you go more than two days without a movement. Do not use  MiraLax more than once per day. Call your doctor if you are still constipated or irregular after using this medication for 7 days in a row.  If you continue to have problems with postoperative constipation, please contact the office for further assistance and recommendations.  If you experience "the worst abdominal pain ever" or develop nausea or vomiting, please contact the office immediatly for further recommendations for treatment.  Take Lovenox  injections for two weeks.  When discharged from the skilled rehab facility, please have the facility set up the patient's [place] prior to being released.  Please make sure this gets set up prior to release in order to avoid any lapse of therapy following the rehab stay.  Also provide the patient with their medications at time of release from the facility to include their pain medication, the muscle relaxants, and their blood thinner medication.  If the patient is still at the rehab facility at time of follow up appointment, please also assist the patient in arranging follow up appointment in our office and any transportation needs. ICE to the affected knee or hip every three hours for 30 minutes at a time and then as needed for pain and swelling.

## 2015-01-01 NOTE — Progress Notes (Signed)
Report called to [facility] Supervisor.

## 2015-01-01 NOTE — Progress Notes (Signed)
Items obtained from safe with Security. Security, myself and patient counted in cash, (1) twenty dollar bill, (3) $10 bills, 91) $5 bill, (8) $1 bills and (16) $100.00 bills.A Credit card, (1) [employer] Gift Card $30 value, [employer], Check for $20.10, (1) [employer] Gift Card and [employer]. Patient agree and understands those are her belongings and verifies that is all she has in the safe. She has now left with non-emergency transport.

## 2015-01-01 NOTE — Clinical Social Work Placement (Signed)
Patient is set to discharge to [facility] today. Patient & nephew, Roderic Palau aware. Discharge packet given to RN, Miquel Dunn. [facility] called for transport.     Raynaldo Opitz, [facility] Clinical Social Worker cell #: [phone]    CLINICAL SOCIAL WORK PLACEMENT  NOTE  Date:  01/01/2015  Patient Details  Name: Jaclyn Herrera MRN: [id number] Date of Birth: 19-Jul-1945  Clinical Social Work is seeking post-discharge placement for this patient at the [place] level of care (*CSW will initial, date and re-position this form in  chart as items are completed):  Yes   Patient/family provided with [facility] Work Department's list of facilities offering this level of care within the geographic area requested by the patient (or if unable, by the patient's family).  Yes   Patient/family informed of their freedom to choose among providers that offer the needed level of care, that participate in Medicare, Medicaid or managed care program needed by the patient, have an available bed and are willing to accept the patient.  Yes   Patient/family informed of [facility]'s ownership interest in [facility] and [facility], as well as of the fact that they are under no obligation to receive care at these facilities.  PASRR submitted to EDS on 12/30/14     PASRR number received on 12/30/14     Existing PASRR number confirmed on       FL2 transmitted to all facilities in geographic area requested by pt/family on 12/30/14     FL2 transmitted to all facilities within larger geographic area on       Patient informed that his/her managed care company has contracts with or will negotiate with certain facilities, including the following:        Yes   Patient/family informed of bed offers received.  Patient chooses bed at [facility]     Physician recommends and patient chooses bed at      Patient to be transferred to [facility] on  01/01/15.  Patient to be transferred to facility by [facility]     Patient family notified on 01/01/15 of transfer.  Name of family member notified:  patient's nephew, Roderic Palau via phone     PHYSICIAN       Additional Comment:    _______________________________________________ Standley Brooking, LCSW 01/01/2015, 2:11 PM

## 2015-01-01 NOTE — Discharge Summary (Signed)
Physician Discharge Summary   Patient ID: Jaclyn Herrera MRN: [id number] DOB/AGE: January 19, 1946 69 y.o.  Admit date: 12/29/2014 Discharge date: Tentative Date of Discharge - Friday 01/01/2015  Primary Diagnosis:  Avulsion fracture, posterior aspect, left calcaneus.  Admission Diagnoses:  Past Medical History  Diagnosis Date  . Diabetes mellitus   . Hypertension   . Arthritis   . Shingles   . hypothyroidism   . Bronchitis   . Hyperlipidemia   . GERD (gastroesophageal reflux disease)   . PONV (postoperative nausea and vomiting)    Discharge Diagnoses:   Principal Problem:   Diabetes mellitus Active Problems:   Essential hypertension   HLD (hyperlipidemia)   Depression   GERD (gastroesophageal reflux disease)   Hypothyroid   Calcaneal fracture   Calcaneus fracture  Estimated body mass index is 33.46 kg/(m^2) as calculated from the following:   Height as of this encounter: _0  (1.727 m).   Weight as of this encounter: 99.791 kg (220 lb).  Procedure(s) (LRB): OPEN REDUCTION INTERNAL FIXATION  LEFT CALCANEOUS FRACTURE (Left)   Consults: Medical Team - [facility]  HPI: Jaclyn Herrera is a 69 year old female, who was at home today, trying to fix drapes upon her tiptoes and all of sudden, she felt a pop in her left heel. She went to the Emergency Room at [facility] and was noted to have a calcaneal tuberosity avulsion fracture. She came to the [place], radiographs confirmed that and we were consulted for management. She presents now for open reduction and internal fixation of this displaced fracture.  Laboratory Data: Admission on 12/29/2014  Component Date Value Ref Range Status  . WBC 12/29/2014 7.2  4.0 - 10.5 K/uL Final  . RBC 12/29/2014 4.08  3.87 - 5.11 MIL/uL Final  . Hemoglobin 12/29/2014 11.0* 12.0 - 15.0 g/dL Final  . HCT 12/29/2014 33.3* 36.0 - 46.0 % Final  . MCV 12/29/2014 81.6  78.0 - 100.0 fL Final  . MCH 12/29/2014 27.0  26.0 - 34.0 pg  Final  . MCHC 12/29/2014 33.0  30.0 - 36.0 g/dL Final  . RDW 12/29/2014 15.2  11.5 - 15.5 % Final  . Platelets 12/29/2014 144* 150 - 400 K/uL Final  . Sodium 12/29/2014 134* 135 - 145 mmol/L Final  . Potassium 12/29/2014 5.2* 3.5 - 5.1 mmol/L Final  . Chloride 12/29/2014 105  101 - 111 mmol/L Final  . CO2 12/29/2014 22  22 - 32 mmol/L Final  . Glucose, Bld 12/29/2014 515* 65 - 99 mg/dL Final  . BUN 12/29/2014 20  6 - 20 mg/dL Final  . Creatinine, Ser 12/29/2014 0.79  0.44 - 1.00 mg/dL Final  . Calcium 12/29/2014 8.7* 8.9 - 10.3 mg/dL Final  . Total Protein 12/29/2014 7.2  6.5 - 8.1 g/dL Final  . Albumin 12/29/2014 4.3  3.5 - 5.0 g/dL Final  . AST 12/29/2014 14* 15 - 41 U/L Final  . ALT 12/29/2014 15  14 - 54 U/L Final  . Alkaline Phosphatase 12/29/2014 134* 38 - 126 U/L Final  . Total Bilirubin 12/29/2014 2.1* 0.3 - 1.2 mg/dL Final  . GFR calc non Af Amer 12/29/2014 >60  >60 mL/min Final  . GFR calc Af Amer 12/29/2014 >60  >60 mL/min Final   Comment: (NOTE) The eGFR has been calculated using the CKD EPI equation. This calculation has not been validated in all clinical situations. eGFR's persistently <60 mL/min signify possible Chronic Kidney Disease.   . Anion gap 12/29/2014 7  5 -  15 Final  . Prothrombin Time 12/29/2014 14.4  11.6 - 15.2 seconds Final  . INR 12/29/2014 1.10  0.00 - 1.49 Final  . Color, Urine 12/29/2014 YELLOW  YELLOW Final  . APPearance 12/29/2014 CLEAR  CLEAR Final  . Specific Gravity, Urine 12/29/2014 1.025  1.005 - 1.030 Final  . pH 12/29/2014 5.5  5.0 - 8.0 Final  . Glucose, UA 12/29/2014 >1000* NEGATIVE mg/dL Final  . Hgb urine dipstick 12/29/2014 MODERATE* NEGATIVE Final  . Bilirubin Urine 12/29/2014 NEGATIVE  NEGATIVE Final  . Ketones, ur 12/29/2014 >80* NEGATIVE mg/dL Final  . Protein, ur 12/29/2014 NEGATIVE  NEGATIVE mg/dL Final  . Urobilinogen, UA 12/29/2014 0.2  0.0 - 1.0 mg/dL Final  . Nitrite 12/29/2014 NEGATIVE  NEGATIVE Final  . Leukocytes, UA  12/29/2014 NEGATIVE  NEGATIVE Final  . Glucose-Capillary 12/29/2014 450* 65 - 99 mg/dL Final  . RBC / HPF 12/29/2014 3-6  <3 RBC/hpf Final  . Urine-Other 12/29/2014 RARE YEAST   Final  . Glucose-Capillary 12/29/2014 386* 65 - 99 mg/dL Final  . Glucose-Capillary 12/29/2014 398* 65 - 99 mg/dL Final  . WBC 12/29/2014 7.3  4.0 - 10.5 K/uL Final  . RBC 12/29/2014 4.06  3.87 - 5.11 MIL/uL Final  . Hemoglobin 12/29/2014 11.2* 12.0 - 15.0 g/dL Final  . HCT 12/29/2014 33.7* 36.0 - 46.0 % Final  . MCV 12/29/2014 83.0  78.0 - 100.0 fL Final  . MCH 12/29/2014 27.6  26.0 - 34.0 pg Final  . MCHC 12/29/2014 33.2  30.0 - 36.0 g/dL Final  . RDW 12/29/2014 15.4  11.5 - 15.5 % Final  . Platelets 12/29/2014 151  150 - 400 K/uL Final  . Creatinine, Ser 12/29/2014 0.84  0.44 - 1.00 mg/dL Final  . GFR calc non Af Amer 12/29/2014 >60  >60 mL/min Final  . GFR calc Af Amer 12/29/2014 >60  >60 mL/min Final   Comment: (NOTE) The eGFR has been calculated using the CKD EPI equation. This calculation has not been validated in all clinical situations. eGFR's persistently <60 mL/min signify possible Chronic Kidney Disease.   . Hgb A1c MFr Bld 12/29/2014 7.6* 4.8 - 5.6 % Final   Comment: (NOTE)         Pre-diabetes: 5.7 - 6.4         Diabetes: >6.4         Glycemic control for adults with diabetes: <7.0   . Mean Plasma Glucose 12/29/2014 171   Final   Comment: (NOTE) Performed At: [facility] [place], [place] [id number] Lindon Romp MD PP:[id number]   . Glucose-Capillary 12/29/2014 254* 65 - 99 mg/dL Final  . Glucose-Capillary 12/30/2014 288* 65 - 99 mg/dL Final  . Comment 1 12/30/2014 Notify RN   Final  . Glucose-Capillary 12/30/2014 160* 65 - 99 mg/dL Final  . Comment 1 12/30/2014 Notify RN   Final  . Glucose-Capillary 12/30/2014 213* 65 - 99 mg/dL Final  . Glucose-Capillary 12/29/2014 459* 65 - 99 mg/dL Final  . Comment 1 12/29/2014 Call MD NNP PA CNM   Final  . Sodium  12/30/2014 136  135 - 145 mmol/L Final  . Potassium 12/30/2014 4.0  3.5 - 5.1 mmol/L Final   Comment: DELTA CHECK NOTED REPEATED TO VERIFY   . Chloride 12/30/2014 102  101 - 111 mmol/L Final  . CO2 12/30/2014 25  22 - 32 mmol/L Final  . Glucose, Bld 12/30/2014 345* 65 - 99 mg/dL Final  . BUN 12/30/2014 16  6 -  20 mg/dL Final  . Creatinine, Ser 12/30/2014 0.81  0.44 - 1.00 mg/dL Final  . Calcium 12/30/2014 8.2* 8.9 - 10.3 mg/dL Final  . GFR calc non Af Amer 12/30/2014 >60  >60 mL/min Final  . GFR calc Af Amer 12/30/2014 >60  >60 mL/min Final   Comment: (NOTE) The eGFR has been calculated using the CKD EPI equation. This calculation has not been validated in all clinical situations. eGFR's persistently <60 mL/min signify possible Chronic Kidney Disease.   . Anion gap 12/30/2014 9  5 - 15 Final  . Glucose-Capillary 12/30/2014 363* 65 - 99 mg/dL Final  . Glucose-Capillary 12/30/2014 333* 65 - 99 mg/dL Final  . Glucose-Capillary 12/30/2014 267* 65 - 99 mg/dL Final  . Glucose-Capillary 12/30/2014 165* 65 - 99 mg/dL Final  . Glucose-Capillary 12/30/2014 86  65 - 99 mg/dL Final  . Glucose-Capillary 12/31/2014 68  65 - 99 mg/dL Final  . Glucose-Capillary 12/31/2014 81  65 - 99 mg/dL Final  . Glucose-Capillary 12/31/2014 198* 65 - 99 mg/dL Final  . WBC 12/31/2014 7.7  4.0 - 10.5 K/uL Final  . RBC 12/31/2014 3.78* 3.87 - 5.11 MIL/uL Final  . Hemoglobin 12/31/2014 10.2* 12.0 - 15.0 g/dL Final  . HCT 12/31/2014 30.7* 36.0 - 46.0 % Final  . MCV 12/31/2014 81.2  78.0 - 100.0 fL Final  . MCH 12/31/2014 27.0  26.0 - 34.0 pg Final  . MCHC 12/31/2014 33.2  30.0 - 36.0 g/dL Final  . RDW 12/31/2014 15.4  11.5 - 15.5 % Final  . Platelets 12/31/2014 137* 150 - 400 K/uL Final  . Glucose-Capillary 12/31/2014 146* 65 - 99 mg/dL Final  . Glucose-Capillary 12/31/2014 179* 65 - 99 mg/dL Final  . WBC 01/01/2015 7.0  4.0 - 10.5 K/uL Final  . RBC 01/01/2015 3.64* 3.87 - 5.11 MIL/uL Final  . Hemoglobin  01/01/2015 10.2* 12.0 - 15.0 g/dL Final  . HCT 01/01/2015 29.8* 36.0 - 46.0 % Final  . MCV 01/01/2015 81.9  78.0 - 100.0 fL Final  . MCH 01/01/2015 28.0  26.0 - 34.0 pg Final  . MCHC 01/01/2015 34.2  30.0 - 36.0 g/dL Final  . RDW 01/01/2015 15.4  11.5 - 15.5 % Final  . Platelets 01/01/2015 142* 150 - 400 K/uL Final  . Sodium 01/01/2015 134* 135 - 145 mmol/L Final  . Potassium 01/01/2015 3.9  3.5 - 5.1 mmol/L Final  . Chloride 01/01/2015 101  101 - 111 mmol/L Final  . CO2 01/01/2015 25  22 - 32 mmol/L Final  . Glucose, Bld 01/01/2015 341* 65 - 99 mg/dL Final  . BUN 01/01/2015 15  6 - 20 mg/dL Final  . Creatinine, Ser 01/01/2015 0.78  0.44 - 1.00 mg/dL Final  . Calcium 01/01/2015 8.4* 8.9 - 10.3 mg/dL Final  . GFR calc non Af Amer 01/01/2015 >60  >60 mL/min Final  . GFR calc Af Amer 01/01/2015 >60  >60 mL/min Final   Comment: (NOTE) The eGFR has been calculated using the CKD EPI equation. This calculation has not been validated in all clinical situations. eGFR's persistently <60 mL/min signify possible Chronic Kidney Disease.   . Anion gap 01/01/2015 8  5 - 15 Final  . Glucose-Capillary 12/31/2014 311* 65 - 99 mg/dL Final  . Comment 1 12/31/2014 Notify RN   Final  . Glucose-Capillary 12/31/2014 341* 65 - 99 mg/dL Final  . Glucose-Capillary 01/01/2015 312* 65 - 99 mg/dL Final  . Comment 1 01/01/2015 Notify RN   Final  . Edmonia Lynch 01/01/2015  279* 65 - 99 mg/dL Final     X-Rays:Dg Ankle Complete Left  12/29/2014   CLINICAL DATA:  Pain, swelling, and bruising of the left foot and ankle secondary to a twisting injury.  EXAM: LEFT ANKLE COMPLETE - 3+ VIEW  COMPARISON:  None.  FINDINGS: There is a fracture through the posterior superior aspect of the calcaneus. The fragment is approximately 3.7 x 1.6 cm and includes the Achilles insertion. The posterior aspect of the fracture is distracted 2 cm.  There is diffuse soft tissue swelling. Prominent plantar calcaneal osteophyte.   IMPRESSION: Fracture of the posterior superior aspect of the calcaneus with displacement.   Electronically Signed   By: Lorriane Shire M.D.   On: 12/29/2014 13:37   Dg Os Calcis Left  12/29/2014   CLINICAL DATA:  Calcaneal fracture  EXAM: LEFT OS CALCIS - 2+ VIEW  COMPARISON:  Radiographs from earlier the same day  FINDINGS: Two fluoroscopic spot images document fixation of the calcaneal tuberosity fracture with 2 orthopedic screws, fragments in near anatomic alignment.  IMPRESSION: 1. Internal fixation of calcaneal tuberosity fracture in near anatomic alignment.   Electronically Signed   By: Lucrezia Europe M.D.   On: 12/29/2014 19:48   Dg Foot Complete Left  12/29/2014   CLINICAL DATA:  Rolled left foot and ankle with pain bruising left heel  EXAM: LEFT FOOT - COMPLETE 3+ VIEW  COMPARISON:  None.  FINDINGS: Diffuse osteopenia. Widely separated fracture involving the calcaneus tuberosity. Superior fracture fragment is displaced nearly 2 cm proximally, suggesting injury involving Achilles tendon. There is a small heel spur as well.  IMPRESSION: Acute fracture calcaneus tuberosity.   Electronically Signed   By: Skipper Cliche M.D.   On: 12/29/2014 13:41   Dg C-arm 1-60 Min-no Report  12/29/2014   CLINICAL DATA: surgery   C-ARM 1-60 MINUTES  Fluoroscopy was utilized by the requesting physician.  No radiographic  interpretation.     EKG: Orders placed or performed during the hospital encounter of 10/31/14  . ED EKG  . ED EKG  . EKG 12-Lead  . EKG 12-Lead  . EKG 12-Lead  . EKG 12-Lead  . EKG 12-Lead  . EKG 12-Lead  . EKG      Hospital Course: Patient was admitted to Hospital for the above states problem after xrays proved positive for a fracture of the left calcaneous.  Following appropriate workup and evaluation, the patient was taken to the OR and underwent the above state procedure without complications.  Patient tolerated the procedure well and was later transferred to the recovery room and  then to the orthopaedic floor for postoperative care.  They were given PO and IV analgesics for pain control following their surgery.  They were given 24 hours of postoperative antibiotics of  Anti-infectives    Start     Dose/Rate Route Frequency Ordered Stop   12/30/14 2200  amoxicillin-clavulanate (AUGMENTIN) 875-125 MG per tablet 1 tablet     1 tablet Oral 2 times daily 12/29/14 2121     12/30/14 0030  ceFAZolin (ANCEF) IVPB 2 g/50 mL premix     2 g 100 mL/hr over 30 Minutes Intravenous Every 6 hours 12/29/14 2121 12/30/14 1126   12/29/14 1745  ceFAZolin (ANCEF) IVPB 2 g/50 mL premix     2 g 100 mL/hr over 30 Minutes Intravenous  Once 12/29/14 1740 12/29/14 1837     and started on DVT prophylaxis in the form of Lovenox.   PT and OT  were ordered for gait training and ambulation.  The patient's weight bearing status was Strict NON weight bearing with therapy. Discharge planning was consulted to help with postop disposition and equipment needs. Social worker consulted to assist with placement of the patient.  Patient had a tough night on the evening of surgery with pain.  Encouraged to keep foot and ankle elevated on pillows.  They started to get up OOB with therapy on day one.   They continued to work with therapy into day two.  The splint was in good condition and encouraged to keep clean and dry.  Patient was seen in rounds on day two by Dr. Wynelle Link and it was felt that she would be ready to go to the SNF however she did have a productive cough.  A CXR was ordered and was pending at the time of this summary.  If the CXR proved negative, will encourage IS and Pulmonary Toilet and allow to transfer tot he SNF.  If and positive findings, the will get Medicine recommendations on treatment and will decide about transfer based upon recommendations.  If just oral ABX, , will still likely be ready to go to the SNF.  Discharge to SNF if CXR checks out okay Diet - Cardiac diet and Diabetic diet Follow up -  in 2 weeks Activity - NWB to the left leg Disposition - Skilled nursing facility Condition Upon Discharge - Stable D/C Meds - See DC Summary DVT Prophylaxis - Lovenox for two weeks.  Discharge Instructions    Call MD / Call 911    Complete by:  As directed   If you experience chest pain or shortness of breath, CALL 911 and be transported to the hospital emergency room.  If you develope a fever above 101 F, pus (white drainage) or increased drainage or redness at the wound, or calf pain, call your surgeon's office.     Constipation Prevention    Complete by:  As directed   Drink plenty of fluids.  Prune juice may be helpful.  You may use a stool softener, such as Colace (over the counter) 100 mg twice a day.  Use MiraLax (over the counter) for constipation as needed.     Diet - low sodium heart healthy    Complete by:  As directed      Diet Carb Modified    Complete by:  As directed      Discharge instructions    Complete by:  As directed   Pick up stool softner and laxative for home use following surgery while on pain medications. Do not get the splint wet Continue to use ice for pain and swelling after surgery.  Strict Non Weight Bearing to the Left Leg  Take Lovenox injections for two weeks and then discontinue the Lovenox injections.  Postoperative Constipation Protocol  Constipation - defined medically as fewer than three stools per week and severe constipation as less than one stool per week.  One of the most common issues patients have following surgery is constipation.  Even if you have a regular bowel pattern at home, your normal regimen is likely to be disrupted due to multiple reasons following surgery.  Combination of anesthesia, postoperative narcotics, change in appetite and fluid intake all can affect your bowels.  In order to avoid complications following surgery, here are some recommendations in order to help you during your recovery period.  Colace (docusate) - Pick  up an over-the-counter form of Colace or another stool softener  and take twice a day as long as you are requiring postoperative pain medications.  Take with a full glass of water daily.  If you experience loose stools or diarrhea, hold the colace until you stool forms back up.  If your symptoms do not get better within 1 week or if they get worse, check with your doctor.  Dulcolax (bisacodyl) - Pick up over-the-counter and take as directed by the product packaging as needed to assist with the movement of your bowels.  Take with a full glass of water.  Use this product as needed if not relieved by Colace only.   MiraLax (polyethylene glycol) - Pick up over-the-counter to have on hand.  MiraLax is a solution that will increase the amount of water in your bowels to assist with bowel movements.  Take as directed and can mix with a glass of water, juice, soda, coffee, or tea.  Take if you go more than two days without a movement. Do not use MiraLax more than once per day. Call your doctor if you are still constipated or irregular after using this medication for 7 days in a row.  If you continue to have problems with postoperative constipation, please contact the office for further assistance and recommendations.  If you experience "the worst abdominal pain ever" or develop nausea or vomiting, please contact the office immediatly for further recommendations for treatment..  When discharged from the skilled rehab facility, please have the facility set up the patient's [place] prior to being released.  Please make sure this gets set up prior to release in order to avoid any lapse of therapy following the rehab stay.  Also provide the patient with their medications at time of release from the facility to include their pain medication, the muscle relaxants, and their blood thinner medication.  If the patient is still at the rehab facility at time of follow up appointment, please also assist the  patient in arranging follow up appointment in our office and any transportation needs. ICE to the affected knee or hip every three hours for 30 minutes at a time and then as needed for pain and swelling.     Do not sit on low chairs, stoools or toilet seats, as it may be difficult to get up from low surfaces    Complete by:  As directed      Driving restrictions    Complete by:  As directed   No driving until released by the physician.     Increase activity slowly as tolerated    Complete by:  As directed      Lifting restrictions    Complete by:  As directed   No lifting until released by the physician.     Non weight bearing    Complete by:  As directed   Laterality:  left  Extremity:  Lower  No weight to the left leg and splint            Medication List    STOP taking these medications        acetaminophen-codeine 300-30 MG per tablet  Commonly known as:  TYLENOL #3     cephALEXin 500 MG capsule  Commonly known as:  KEFLEX     diazepam 2 MG tablet  Commonly known as:  VALIUM     vitamin C 500 MG tablet  Commonly known as:  ASCORBIC ACID      TAKE these medications  acetaminophen 500 MG tablet  Commonly known as:  TYLENOL  Take 500-1,000 mg by mouth every 6 (six) hours as needed for headache.     albuterol 108 (90 BASE) MCG/ACT inhaler  Commonly known as:  PROVENTIL HFA;VENTOLIN HFA  Inhale 2 puffs into the lungs every 4 (four) hours as needed for wheezing or shortness of breath.     amLODipine 5 MG tablet  Commonly known as:  NORVASC  Take 1 tablet (5 mg total) by mouth daily.     amoxicillin-clavulanate 875-125 MG per tablet  Commonly known as:  AUGMENTIN  Take 1 tablet by mouth 2 (two) times daily. 2 week course that started last Friday 7/15     bisacodyl 10 MG suppository  Commonly known as:  DULCOLAX  Place 1 suppository (10 mg total) rectally daily as needed for moderate constipation.     BLINK TEARS OP  Place 1 drop into both eyes 4 (four)  times daily as needed (Dry eyes).     carisoprodol 350 MG tablet  Commonly known as:  SOMA  Take 175-350 mg by mouth daily as needed for muscle spasms. For cramps     docusate sodium 100 MG capsule  Commonly known as:  COLACE  Take 1 capsule (100 mg total) by mouth 2 (two) times daily.     enoxaparin 40 MG/0.4ML injection  Commonly known as:  LOVENOX  Inject 0.4 mLs (40 mg total) into the skin daily. Take for two weeks and then discontinue the Lovenox injections.     esomeprazole 20 MG capsule  Commonly known as:  NEXIUM  Take 40 mg by mouth daily at 12 noon.     fexofenadine 180 MG tablet  Commonly known as:  ALLEGRA  Take 180 mg by mouth daily as needed for allergies or rhinitis.     fluticasone 50 MCG/ACT nasal spray  Commonly known as:  FLONASE  Place 2 sprays into the nose 2 (two) times daily as needed for allergies.     furosemide 20 MG tablet  Commonly known as:  LASIX  Take 20 mg by mouth 2 (two) times daily as needed for fluid.     HUMULIN R 500 UNIT/ML injection  Generic drug:  insulin regular human CONCENTRATED  Inject 60-150 Units into the skin 3 (three) times daily before meals. Sliding scale range is reported 60-150 units based on a sliding scale. She takes it 30 minutes before meals.     levothyroxine 125 MCG tablet  Commonly known as:  SYNTHROID, LEVOTHROID  Take 125-187.5 mcg by mouth daily. Takes 125 mcg daily. On Sundays she takes 187.5 mcg     meclizine 25 MG tablet  Commonly known as:  ANTIVERT  Take 25 mg by mouth 2 (two) times daily.     methocarbamol 500 MG tablet  Commonly known as:  ROBAXIN  Take 1 tablet (500 mg total) by mouth every 6 (six) hours as needed for muscle spasms.     metoCLOPramide 5 MG tablet  Commonly known as:  REGLAN  Take 1 tablet (5 mg total) by mouth every 8 (eight) hours as needed for nausea (if ondansetron (ZOFRAN) ineffective.).     mometasone 0.1 % cream  Commonly known as:  ELOCON  Apply 1 application topically  daily.     ondansetron 4 MG tablet  Commonly known as:  ZOFRAN  Take 1 tablet (4 mg total) by mouth every 6 (six) hours as needed for nausea.     oxyCODONE 5 MG  immediate release tablet  Commonly known as:  Oxy IR/ROXICODONE  Take 1-2 tablets (5-10 mg total) by mouth every 3 (three) hours as needed for moderate pain or severe pain.     PARoxetine 30 MG tablet  Commonly known as:  PAXIL  Take 30 mg by mouth daily.     PERDIEM PO  Take 1 tablet by mouth daily as needed (constipation).     polyethylene glycol packet  Commonly known as:  MIRALAX / GLYCOLAX  Take 17 g by mouth daily as needed for mild constipation.     quinapril 40 MG tablet  Commonly known as:  ACCUPRIL  Take 1 tablet (40 mg total) by mouth daily.     simvastatin 20 MG tablet  Commonly known as:  ZOCOR  Take 20 mg by mouth at bedtime.           Follow-up Information    Follow up with Gearlean Alf, MD. Schedule an appointment as soon as possible for a visit on 01/12/2015.   Specialty:  Orthopedic Surgery   Why:  Call office ASAP at [phone] to setup appointment with Dr. Wynelle Link in two weeks on Tuesday 01/12/2015.   Contact information:   [address] [place] [address] [phone]       Signed: Arlee Muslim, PA-C Orthopaedic Surgery 01/01/2015, 9:36 AM

## 2015-01-01 NOTE — Progress Notes (Addendum)
Subjective: 3 Days Post-Op Procedure(s) (LRB): OPEN REDUCTION INTERNAL FIXATION  LEFT CALCANEOUS FRACTURE (Left) Patient reports pain as mild and moderate.   Patient seen in rounds with Dr. Wynelle Link.  She states that she doing better this morning, pain is better. Patient is well, but has had some minor complaints of pain in the foot and ankle, requiring pain medications Patient is ready to go to the SNF. She has had some complaint of cough this morning.  She also has a history of bronchitis in the past.  Loraine Leriche check a CXR and get Medicine recommendations if any positive findings on CXR.  If lungs clear and just some congestion, encourage IS and Pulmonary Toilet due to hindered mobility and then will allow transfer over to the SNF of choice.  Objective: Vital signs in last 24 hours: Temp:  [98.9 F (37.2 C)-99.8 F (37.7 C)] 99.6 F (37.6 C) (07/22 0426) Pulse Rate:  [86-98] 86 (07/22 0426) Resp:  [16] 16 (07/22 0426) BP: (145-172)/(50-88) 145/61 mmHg (07/22 0426) SpO2:  [91 %-97 %] 97 % (07/22 0426)  Intake/Output from previous day:  Intake/Output Summary (Last 24 hours) at 01/01/15 0920 Last data filed at 01/01/15 0757  Gross per 24 hour  Intake    600 ml  Output   1650 ml  Net  -1050 ml    Intake/Output this shift: Total I/O In: -  Out: 400 [Urine:400]  Labs:  Recent Labs  12/29/14 1538 12/29/14 2250 12/31/14 1202 01/01/15 0500  HGB 11.0* 11.2* 10.2* 10.2*    Recent Labs  12/31/14 1202 01/01/15 0500  WBC 7.7 7.0  RBC 3.78* 3.64*  HCT 30.7* 29.8*  PLT 137* 142*    Recent Labs  12/30/14 1148 01/01/15 0500  NA 136 134*  K 4.0 3.9  CL 102 101  CO2 25 25  BUN 16 15  CREATININE 0.81 0.78  GLUCOSE 345* 341*  CALCIUM 8.2* 8.4*    Recent Labs  12/29/14 1538  INR 1.10    EXAM: General - Patient is Alert, Appropriate and Oriented Extremity - Neurovascular intact Sensation intact distally Splint in good condition Splint - clean, dry Motor  Function - intact, moving toes well on exam.   KEEP FOOT AND LEG ELEVATED ON PILLOWS.   Assessment/Plan: 3 Days Post-Op Procedure(s) (LRB): OPEN REDUCTION INTERNAL FIXATION  LEFT CALCANEOUS FRACTURE (Left) Procedure(s) (LRB): OPEN REDUCTION INTERNAL FIXATION  LEFT CALCANEOUS FRACTURE (Left) Past Medical History  Diagnosis Date  . Diabetes mellitus   . Hypertension   . Arthritis   . Shingles   . hypothyroidism   . Bronchitis   . Hyperlipidemia   . GERD (gastroesophageal reflux disease)   . PONV (postoperative nausea and vomiting)    Principal Problem:   Diabetes mellitus Active Problems:   Essential hypertension   HLD (hyperlipidemia)   Depression   GERD (gastroesophageal reflux disease)   Hypothyroid   Calcaneal fracture   Calcaneus fracture  Estimated body mass index is 33.46 kg/(m^2) as calculated from the following:   Height as of this encounter: 5\' 8"  (1.727 m).   Weight as of this encounter: 99.791 kg (220 lb). Up with therapy Discharge to SNF if CXR checks out okay Diet - Cardiac diet and Diabetic diet Follow up - in 2 weeks Activity - NWB to the left leg Disposition - Skilled nursing facility Condition Upon Discharge - Stable D/C Meds - See DC Summary DVT Prophylaxis - Lovenox for two weeks.  Arlee Muslim, PA-C Orthopaedic Surgery 01/01/2015, 9:20  AM    

## 2015-01-04 ENCOUNTER — Non-Acute Institutional Stay (SKILLED_NURSING_FACILITY): Payer: Medicare Other | Admitting: Internal Medicine

## 2015-01-04 DIAGNOSIS — E039 Hypothyroidism, unspecified: Secondary | ICD-10-CM

## 2015-01-04 DIAGNOSIS — D62 Acute posthemorrhagic anemia: Secondary | ICD-10-CM

## 2015-01-04 DIAGNOSIS — F329 Major depressive disorder, single episode, unspecified: Secondary | ICD-10-CM

## 2015-01-04 DIAGNOSIS — S92002S Unspecified fracture of left calcaneus, sequela: Secondary | ICD-10-CM

## 2015-01-04 DIAGNOSIS — R059 Cough, unspecified: Secondary | ICD-10-CM

## 2015-01-04 DIAGNOSIS — R05 Cough: Secondary | ICD-10-CM | POA: Diagnosis not present

## 2015-01-04 DIAGNOSIS — K219 Gastro-esophageal reflux disease without esophagitis: Secondary | ICD-10-CM

## 2015-01-04 DIAGNOSIS — K5909 Other constipation: Secondary | ICD-10-CM

## 2015-01-04 DIAGNOSIS — E785 Hyperlipidemia, unspecified: Secondary | ICD-10-CM

## 2015-01-04 DIAGNOSIS — E119 Type 2 diabetes mellitus without complications: Secondary | ICD-10-CM

## 2015-01-04 DIAGNOSIS — I10 Essential (primary) hypertension: Secondary | ICD-10-CM | POA: Diagnosis not present

## 2015-01-04 DIAGNOSIS — R600 Localized edema: Secondary | ICD-10-CM | POA: Diagnosis not present

## 2015-01-04 DIAGNOSIS — J309 Allergic rhinitis, unspecified: Secondary | ICD-10-CM

## 2015-01-04 DIAGNOSIS — F32A Depression, unspecified: Secondary | ICD-10-CM

## 2015-01-04 LAB — BASIC METABOLIC PANEL
Glucose: 504 mg/dL
Potassium: 4.2 mmol/L (ref 3.4–5.3)
SODIUM: 133 mmol/L — AB (ref 137–147)

## 2015-01-04 NOTE — Progress Notes (Signed)
Patients medications returned to her via courier service (courier-Brandy). [place], [facility] aware of medicaitons being transported to patient at [facility].   Patient called about medications being brought to her.

## 2015-01-04 NOTE — Progress Notes (Signed)
Patient called and asked to speak to charge nurse on 7/25 at 0915am.  Patient informed charge nurse that she had home medications that were not returned to her when she was discharged.   Patient was not able to tell me specific medications, she just stated that there are "a whole bunch of medications".   Charge nurse called pharmacy, and pharmacy tech did find a large bag of medications that belonged to patient.  Patient was called back and informed that medications are still here being kept in the pharmacy. Patient stated she has no family that can come and pick up the medications. I informed her that I would talk with my leadership about how we can get her medications returned to her.   Patient is at [facility], in room 401.

## 2015-01-04 NOTE — Progress Notes (Signed)
Patient ID: Jaclyn Herrera, female   DOB: 1945/11/11, 69 y.o.   MRN: [id number]     [facility]   PCP: Asencion Noble, MD  Code Status: full code  Allergies  Allergen Reactions  . Sulfonamide Derivatives Anaphylaxis  . Bactrim [Sulfamethoxazole-Trimethoprim] Nausea Only  . Erythromycin Nausea And Vomiting  . Mucinex [Guaifenesin Er] Nausea And Vomiting  . Aspirin Rash  . Levofloxacin Rash  . Tape Rash    Chief Complaint  Patient presents with  . New Admit To SNF     HPI:  69 y.o. patient is here for short term rehabilitation post hospital admission from 12/29/14-01/01/15 with avulsion fracture of posterior aspect of left calcaneus. She underwent open reduction and internal fixation. Her pain is under control with current pain regimen. She has been coughing out green mucus and feels mucus to be stuck in her chest at times. Denies runny nose besides her chronic allergies. Denies fever or chills or dyspnea. She also has some discomfort at back of her throat post extubation and feels this is getting better on daily basis. Denies any difficulty swallowing. 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 She is upset with [facility] hospital for messing up her diabetes medication and not sending her home medication with her to the facility. She is not happy with care at [facility] place. She has medical history of DM, GERD, constipation, depression among others.  Review of Systems:  Constitutional: Negative for fever, chills, diaphoresis.  HENT: Negative for headache, congestion, nasal discharge, hearing loss, earache, sore throat, difficulty swallowing.   Eyes: Negative for eye pain, blurred vision, double vision and  discharge.  Respiratory: Negative for shortness of breath and wheezing.   Cardiovascular: Negative for chest pain, palpitations, leg swelling.  Gastrointestinal: Negative for heartburn, nausea, vomiting, abdominal pain, loss of appetite. PPI is helpful. Had bowel movement today Genitourinary: Negative for dysuria, flank pain.  Musculoskeletal: Negative for back pain, falls Skin: Negative for itching, rash.  Neurological: Negative for dizziness, tingling, focal weakness Psychiatric/Behavioral: Negative for depression   Past Medical History  Diagnosis Date  . Diabetes mellitus   . Hypertension   . Arthritis   . Shingles   . hypothyroidism   . Bronchitis   . Hyperlipidemia   . GERD (gastroesophageal reflux disease)   . PONV (postoperative nausea and vomiting)    Past Surgical History  Procedure Laterality Date  . Cholecystectomy    . Ankle surgery      x 4  . Non cancerous mass removed      small intestine  . Tonsillectomy    . Nasal sinus surgery    . Cataract extraction Bilateral   . Appendectomy    . Knee arthroscopy    . Orif calcaneous fracture Left 12/29/2014    Procedure: OPEN REDUCTION INTERNAL FIXATION  LEFT CALCANEOUS FRACTURE;  Surgeon: Gaynelle Arabian, MD;  Location: [facility];  Service: Orthopedics;  Laterality: Left;   Social History:   reports that she has never smoked. She has never used smokeless tobacco. She reports that she does not drink alcohol or use illicit drugs.  Family History  Problem Relation Age of Onset  . Diabetes Mother   . Asthma Mother   .  Hypertension Father   . Heart disease Father   . Diabetes Father   . Heart disease Sister   . Hypertension Sister   . Asthma Sister   . Diabetes Brother   . CAD Brother   . Stroke Brother     Medications:   Medication List       This list is accurate as of: 01/04/15  2:50 PM.  Always use your most recent med list.               acetaminophen 500 MG tablet  Commonly known as:  TYLENOL  Take  500-1,000 mg by mouth every 6 (six) hours as needed for headache.     albuterol 108 (90 BASE) MCG/ACT inhaler  Commonly known as:  PROVENTIL HFA;VENTOLIN HFA  Inhale 2 puffs into the lungs every 4 (four) hours as needed for wheezing or shortness of breath.     amLODipine 5 MG tablet  Commonly known as:  NORVASC  Take 1 tablet (5 mg total) by mouth daily.     amoxicillin-clavulanate 875-125 MG per tablet  Commonly known as:  AUGMENTIN  Take 1 tablet by mouth 2 (two) times daily. 2 week course that started last Friday 7/15     bisacodyl 10 MG suppository  Commonly known as:  DULCOLAX  Place 1 suppository (10 mg total) rectally daily as needed for moderate constipation.     BLINK TEARS OP  Place 1 drop into both eyes 4 (four) times daily as needed (Dry eyes).     carisoprodol 350 MG tablet  Commonly known as:  SOMA  Take 175-350 mg by mouth daily as needed for muscle spasms. For cramps     docusate sodium 100 MG capsule  Commonly known as:  COLACE  Take 1 capsule (100 mg total) by mouth 2 (two) times daily.     enoxaparin 40 MG/0.4ML injection  Commonly known as:  LOVENOX  Inject 0.4 mLs (40 mg total) into the skin daily. Take for two weeks and then discontinue the Lovenox injections.     esomeprazole 20 MG capsule  Commonly known as:  NEXIUM  Take 40 mg by mouth daily at 12 noon.     fexofenadine 180 MG tablet  Commonly known as:  ALLEGRA  Take 180 mg by mouth daily as needed for allergies or rhinitis.     fluticasone 50 MCG/ACT nasal spray  Commonly known as:  FLONASE  Place 2 sprays into the nose 2 (two) times daily as needed for allergies.     furosemide 20 MG tablet  Commonly known as:  LASIX  Take 20 mg by mouth 2 (two) times daily as needed for fluid.     HUMULIN R 500 UNIT/ML injection  Generic drug:  insulin regular human CONCENTRATED  Inject 60-150 Units into the skin 3 (three) times daily before meals. Sliding scale range is reported 60-150 units based on a  sliding scale. She takes it 30 minutes before meals.     levothyroxine 125 MCG tablet  Commonly known as:  SYNTHROID, LEVOTHROID  Take 125-187.5 mcg by mouth daily. Takes 125 mcg daily. On Sundays she takes 187.5 mcg     meclizine 25 MG tablet  Commonly known as:  ANTIVERT  Take 25 mg by mouth 2 (two) times daily.     methocarbamol 500 MG tablet  Commonly known as:  ROBAXIN  Take 1 tablet (500 mg total) by mouth every 6 (six) hours as needed for muscle spasms.  metoCLOPramide 5 MG tablet  Commonly known as:  REGLAN  Take 1 tablet (5 mg total) by mouth every 8 (eight) hours as needed for nausea (if ondansetron (ZOFRAN) ineffective.).     mometasone 0.1 % cream  Commonly known as:  ELOCON  Apply 1 application topically daily.     ondansetron 4 MG tablet  Commonly known as:  ZOFRAN  Take 1 tablet (4 mg total) by mouth every 6 (six) hours as needed for nausea.     oxyCODONE 5 MG immediate release tablet  Commonly known as:  Oxy IR/ROXICODONE  Take 1-2 tablets (5-10 mg total) by mouth every 3 (three) hours as needed for moderate pain or severe pain.     PARoxetine 30 MG tablet  Commonly known as:  PAXIL  Take 30 mg by mouth daily.     PERDIEM PO  Take 1 tablet by mouth daily as needed (constipation).     polyethylene glycol packet  Commonly known as:  MIRALAX / GLYCOLAX  Take 17 g by mouth daily as needed for mild constipation.     quinapril 40 MG tablet  Commonly known as:  ACCUPRIL  Take 1 tablet (40 mg total) by mouth daily.     simvastatin 20 MG tablet  Commonly known as:  ZOCOR  Take 20 mg by mouth at bedtime.         Physical Exam: Filed Vitals:   01/04/15 1449  BP: 162/66  Pulse: 88  Temp: 97.3 F (36.3 C)  Resp: 18  SpO2: 98%    General- elderly female, obese, in no acute distress Head- normocephalic, atraumatic Nose- no maxillary or frontal sinus tenderness Throat- moist mucus membrane Eyes- PERRLA, EOMI, no pallor, no icterus, no discharge,  normal conjunctiva, normal sclera Neck- no cervical lymphadenopathy, no jugular vein distension Cardiovascular- normal s1,s2, no murmurs, palpable dorsalis pedis and radial pulses, trace leg edema Respiratory- bilateral clear to auscultation, no wheeze, no rhonchi, no crackles, no use of accessory muscles Abdomen- bowel sounds present, soft, non tender Musculoskeletal- able to move all 4 extremities, left leg in splint with ace wrap, able to move her toes, good capillary refill Neurological- no focal deficit, alert and oriented to person, place and time Skin- warm and dry Psychiatry- appears anxious and is upset    Labs reviewed: Basic Metabolic Panel:  Recent Labs  12/29/14 1538 12/29/14 2250 12/30/14 1148 01/01/15 0500  NA 134*  --  136 134*  K 5.2*  --  4.0 3.9  CL 105  --  102 101  CO2 22  --  25 25  GLUCOSE 515*  --  345* 341*  BUN 20  --  16 15  CREATININE 0.79 0.84 0.81 0.78  CALCIUM 8.7*  --  8.2* 8.4*   Liver Function Tests:  Recent Labs  09/27/14 1830 12/29/14 1538  AST 22 14*  ALT 24 15  ALKPHOS 159* 134*  BILITOT 2.3* 2.1*  PROT 6.9 7.2  ALBUMIN 4.0 4.3    Recent Labs  09/27/14 1830  LIPASE 13   No results for input(s): AMMONIA in the last 8760 hours. CBC:  Recent Labs  09/27/14 1830 10/31/14 1400  12/29/14 2250 12/31/14 1202 01/01/15 0500  WBC 10.9* 3.6*  < > 7.3 7.7 7.0  NEUTROABS 10.2* 2.9  --   --   --   --   HGB 13.2 10.9*  < > 11.2* 10.2* 10.2*  HCT 40.6 33.4*  < > 33.7* 30.7* 29.8*  MCV 87.3 82.1  < >  83.0 81.2 81.9  PLT 205 130*  < > 151 137* 142*  < > = values in this interval not displayed. Cardiac Enzymes:  Recent Labs  11/02/14 0759 11/02/14 1503 11/02/14 2006  TROPONINI <0.03 0.03 <0.03   BNP: Invalid input(s): POCBNP CBG:  Recent Labs  01/01/15 0421 01/01/15 0817 01/01/15 1145  GLUCAP 312* 279* 369*    Radiological Exams: Dg Ankle Complete Left  12/29/2014   CLINICAL DATA:  Pain, swelling, and bruising  of the left foot and ankle secondary to a twisting injury.  EXAM: LEFT ANKLE COMPLETE - 3+ VIEW  COMPARISON:  None.  FINDINGS: There is a fracture through the posterior superior aspect of the calcaneus. The fragment is approximately 3.7 x 1.6 cm and includes the Achilles insertion. The posterior aspect of the fracture is distracted 2 cm.  There is diffuse soft tissue swelling. Prominent plantar calcaneal osteophyte.  IMPRESSION: Fracture of the posterior superior aspect of the calcaneus with displacement.   Electronically Signed   By: Lorriane Shire M.D.   On: 12/29/2014 13:37   Dg Os Calcis Left  12/29/2014   CLINICAL DATA:  Calcaneal fracture  EXAM: LEFT OS CALCIS - 2+ VIEW  COMPARISON:  Radiographs from earlier the same day  FINDINGS: Two fluoroscopic spot images document fixation of the calcaneal tuberosity fracture with 2 orthopedic screws, fragments in near anatomic alignment.  IMPRESSION: 1. Internal fixation of calcaneal tuberosity fracture in near anatomic alignment.   Electronically Signed   By: Lucrezia Europe M.D.   On: 12/29/2014 19:48   Dg Foot Complete Left  12/29/2014   CLINICAL DATA:  Rolled left foot and ankle with pain bruising left heel  EXAM: LEFT FOOT - COMPLETE 3+ VIEW  COMPARISON:  None.  FINDINGS: Diffuse osteopenia. Widely separated fracture involving the calcaneus tuberosity. Superior fracture fragment is displaced nearly 2 cm proximally, suggesting injury involving Achilles tendon. There is a small heel spur as well.  IMPRESSION: Acute fracture calcaneus tuberosity.   Electronically Signed   By: Skipper Cliche M.D.   On: 12/29/2014 13:41   Dg C-arm 1-60 Min-no Report  12/29/2014   CLINICAL DATA: surgery   C-ARM 1-60 MINUTES  Fluoroscopy was utilized by the requesting physician.  No radiographic  interpretation.    01/01/15 cxr FINDINGS: Cardiac shadow is stable. The lungs are clear bilaterally. There is increased density in the left lung base best seen on the lateral projection  with associated effusion. This may represent some postoperative atelectasis or early infiltrate.   IMPRESSION: Left basilar changes.  Followup films are recommended.    Assessment/Plan  Left calcaneal fracture S/p ORIF. NWB to LLE. Has follow up with orthopedics in 2 weeks. Will have patient work with PT/OT as tolerated to regain strength and restore function.  Fall precautions are in place. Continue lovenox for dvt prophylaxis. Continue oxyIR 5 mg 1-2 tab q3h prn pain with robaxin 500 mg q6h prn muscle spasm.   Blood loss anemia Post op, monitor h&h Lab Results  Component Value Date   WBC 7.0 01/01/2015   HGB 10.2* 01/01/2015   HCT 29.8* 01/01/2015   MCV 81.9 01/01/2015   PLT 142* 01/01/2015   Dm type 2 Lab Results  Component Value Date   HGBA1C 7.6* 12/29/2014  cbg readings in facility300-500s. Currently on low dose sliding scale insulin. As per discharge summary was taking humulin R 500u/ml 60-120 u tid with meals. Will need dosing verified with dr south's office. DON working on this. Will  start her on humulin R 50 u tid with meals for now until further information obtained. As per d/c summary 60-150 u tid with meals. Diabetic diet for now.  HTN Elevated bp reading. Currently on norvasc 5 mg daily and quinapril 40 mg daily, monitor bp q shift and reassess.   Cough With green mucus, chest xray from 01/01/15 s/o possible eary infiltrate to left side. Patient refuses repeat cxr for further follow up at present. Afebrile, lungs CTAB. Add mucinex bid for 5 days and encouraged to use incentive spirometer  Depression On paxil 30 mg daily, monitor mood  HLD Continue home regimen zocor 20 mg daily  Leg edema Stable, on lasix 20 mg bid prn, monitor clinically  Constipation Stable, continue colace 100 mg bid with perdiem daily prn with miralax daily prn  Hypothyroidism Stable, continue levothyroxine 125 mcg daily  Allergic rhinitis Stable on home regimen flonase and allegra,  no changes made  gerd Controlled symptoms with nexium 40 mg daily   Goals of care: short term rehabilitation   Labs/tests ordered: cbc, cmp next lab  Family/ staff Communication: reviewed care plan with patient and nursing supervisor/ DON. counselled patient and informed her that the facility will get in touch with dr south's office to verify on dosing of her insulin and put her back on her home regimen. Also advised to allow a chest xray if cough worsens given her recent finding but patient denies.     Blanchie Serve, MD  [facility] [phone] (Monday-Friday 8 am - 5 pm) [phone] (afterhours)

## 2015-01-15 LAB — CBC AND DIFFERENTIAL
HCT: 27 % — AB (ref 36–46)
HEMOGLOBIN: 8.9 g/dL — AB (ref 12.0–16.0)
PLATELETS: 217 10*3/uL (ref 150–399)
WBC: 5.5 10^3/mL

## 2015-02-12 ENCOUNTER — Encounter: Payer: Self-pay | Admitting: Adult Health

## 2015-02-12 ENCOUNTER — Non-Acute Institutional Stay (SKILLED_NURSING_FACILITY): Payer: Medicare Other | Admitting: Adult Health

## 2015-02-12 DIAGNOSIS — S92002S Unspecified fracture of left calcaneus, sequela: Secondary | ICD-10-CM

## 2015-02-12 DIAGNOSIS — F329 Major depressive disorder, single episode, unspecified: Secondary | ICD-10-CM

## 2015-02-12 DIAGNOSIS — I10 Essential (primary) hypertension: Secondary | ICD-10-CM | POA: Diagnosis not present

## 2015-02-12 DIAGNOSIS — J309 Allergic rhinitis, unspecified: Secondary | ICD-10-CM | POA: Diagnosis not present

## 2015-02-12 DIAGNOSIS — K219 Gastro-esophageal reflux disease without esophagitis: Secondary | ICD-10-CM | POA: Diagnosis not present

## 2015-02-12 DIAGNOSIS — E039 Hypothyroidism, unspecified: Secondary | ICD-10-CM

## 2015-02-12 DIAGNOSIS — E785 Hyperlipidemia, unspecified: Secondary | ICD-10-CM

## 2015-02-12 DIAGNOSIS — D62 Acute posthemorrhagic anemia: Secondary | ICD-10-CM | POA: Diagnosis not present

## 2015-02-12 DIAGNOSIS — R42 Dizziness and giddiness: Secondary | ICD-10-CM

## 2015-02-12 DIAGNOSIS — K5909 Other constipation: Secondary | ICD-10-CM | POA: Diagnosis not present

## 2015-02-12 DIAGNOSIS — R600 Localized edema: Secondary | ICD-10-CM

## 2015-02-12 DIAGNOSIS — E119 Type 2 diabetes mellitus without complications: Secondary | ICD-10-CM | POA: Diagnosis not present

## 2015-02-12 DIAGNOSIS — F32A Depression, unspecified: Secondary | ICD-10-CM

## 2015-02-14 NOTE — Progress Notes (Signed)
Patient ID: Jaclyn Herrera, female   DOB: [birth year], 69 y.o.   MRN: [id number]    DATE:  02/12/15 MRN:  [id number]  BIRTHDAY: 06-08-1946  Facility:  Nursing Home Location:  [place] Room Number: [id number]  LEVEL OF CARE:  SNF (31)  Contact Information    Name Relation Home Work McMullen [phone]         Chief Complaint  Patient presents with  . Discharge Note    Avulsion fracture of left calcaneus S/P ORIF, anemia, hypertension, hypothyroidism, depression, constipation, GERD, vertigo, hyperlipidemia, diabetes mellitus, allergic rhinitis and leg edema    HISTORY OF PRESENT ILLNESS:  This is a 69 year old female who is for discharge home with home health PT, home health aide and nursing for wound care. DME: Rolling walker with 3" wheels. She has been admitted to [facility] on 01/01/15 from [facility]. She has PMH of diabetes mellitus, hypertension, arthritis, shingles, hypothyroidism, bronchitis, hyperlipidemia and GERD. She was trying to fix drapes upon her tip toes and all open side then she felt a pop in her left heel. She first went to [facility] wherein x-ray showed a calcaneal tuberosity avulsion fracture. She then went to [facility] ER , radiographs confirmed fracture. She had ORIF of left calcaneus fracture on 12/29/14.  Her cast was recently removed and noted to have left posterior heel wound. Home health nurse will continue treatment of wound.  Patient was admitted to this facility for short-term rehabilitation after the patient's recent hospitalization.  Patient has completed SNF rehabilitation and therapy has cleared the patient for discharge.   PAST MEDICAL HISTORY:  Past Medical History  Diagnosis Date  . Diabetes mellitus   . Hypertension   . Arthritis   . Shingles   . hypothyroidism   . Bronchitis   . Hyperlipidemia   . GERD (gastroesophageal reflux disease)   . PONV  (postoperative nausea and vomiting)      CURRENT MEDICATIONS: Reviewed  Patient's Medications  New Prescriptions   No medications on file  Previous Medications   ACETAMINOPHEN (TYLENOL) 500 MG TABLET    Take 500-1,000 mg by mouth every 6 (six) hours as needed for headache.    ALBUTEROL (PROVENTIL HFA;VENTOLIN HFA) 108 (90 BASE) MCG/ACT INHALER    Inhale 2 puffs into the lungs every 4 (four) hours as needed for wheezing or shortness of breath.   AMLODIPINE (NORVASC) 5 MG TABLET    Take 1 tablet (5 mg total) by mouth daily.   BISACODYL (DULCOLAX) 10 MG SUPPOSITORY    Place 1 suppository (10 mg total) rectally daily as needed for moderate constipation.   CARISOPRODOL (SOMA) 350 MG TABLET    Take 175-350 mg by mouth daily as needed for muscle spasms. For cramps   DOCUSATE SODIUM (COLACE) 100 MG CAPSULE    Take 1 capsule (100 mg total) by mouth 2 (two) times daily.   FEXOFENADINE (ALLEGRA) 180 MG TABLET    Take 180 mg by mouth daily as needed for allergies or rhinitis.   FLUTICASONE (FLONASE) 50 MCG/ACT NASAL SPRAY    Place 2 sprays into the nose 2 (two) times daily as needed for allergies.    FUROSEMIDE (LASIX) 20 MG TABLET    Take 20 mg by mouth 2 (two) times daily as needed for fluid.    INSULIN REGULAR HUMAN CONCENTRATED (HUMULIN R) 500 UNIT/ML SOLN INJECTION    Inject 60-150 Units into the  skin 3 (three) times daily before meals. Sliding scale range is reported 60-150 units based on a sliding scale. She takes it 30 minutes before meals.   LEVOTHYROXINE (SYNTHROID, LEVOTHROID) 125 MCG TABLET    Take 125-187.5 mcg by mouth daily. Takes 125 mcg daily. On Sundays she takes 187.5 mcg   LISINOPRIL (PRINIVIL,ZESTRIL) 40 MG TABLET    Take 40 mg by mouth daily.   MECLIZINE (ANTIVERT) 25 MG TABLET    Take 25 mg by mouth 2 (two) times daily.   METHOCARBAMOL (ROBAXIN) 500 MG TABLET    Take 1 tablet (500 mg total) by mouth every 6 (six) hours as needed for muscle spasms.   METOCLOPRAMIDE (REGLAN) 5 MG  TABLET    Take 1 tablet (5 mg total) by mouth every 8 (eight) hours as needed for nausea (if ondansetron (ZOFRAN) ineffective.).   MOMETASONE (ELOCON) 0.1 % CREAM    Apply 1 application topically daily.    MULTIPLE VITAMINS-MINERALS (DECUBI-VITE PO)    Take by mouth daily. For wound healing   OMEPRAZOLE (PRILOSEC) 20 MG CAPSULE    Take 20 mg by mouth daily.   ONDANSETRON (ZOFRAN) 4 MG TABLET    Take 1 tablet (4 mg total) by mouth every 6 (six) hours as needed for nausea.   OXYCODONE (OXY IR/ROXICODONE) 5 MG IMMEDIATE RELEASE TABLET    Take 1-2 tablets (5-10 mg total) by mouth every 3 (three) hours as needed for moderate pain or severe pain.   PAROXETINE (PAXIL) 30 MG TABLET    Take 30 mg by mouth daily.   POLYETHYLENE GLYCOL (MIRALAX / GLYCOLAX) PACKET    Take 17 g by mouth daily as needed for mild constipation.   POLYETHYLENE GLYCOL 400 (BLINK TEARS OP)    Place 1 drop into both eyes 4 (four) times daily as needed (Dry eyes).    SENNA-PSYLLIUM (PERDIEM PO)    Take 1 tablet by mouth daily as needed (constipation).    SIMVASTATIN (ZOCOR) 20 MG TABLET    Take 20 mg by mouth at bedtime.    Modified Medications   No medications on file  Discontinued Medications   No medications on file     Allergies  Allergen Reactions  . Sulfonamide Derivatives Anaphylaxis  . Bactrim [Sulfamethoxazole-Trimethoprim] Nausea Only  . Erythromycin Nausea And Vomiting  . Mucinex [Guaifenesin Er] Nausea And Vomiting  . Aspirin Rash  . Levofloxacin Rash  . Tape Rash     REVIEW OF SYSTEMS:  GENERAL: no change in appetite, no fatigue, no weight changes, no fever, chills or weakness EYES: Denies change in vision, dry eyes, eye pain, itching or discharge EARS: Denies change in hearing, ringing in ears, or earache NOSE: Denies nasal congestion or epistaxis MOUTH and THROAT: Denies oral discomfort, gingival pain or bleeding, pain from teeth or hoarseness   RESPIRATORY: no cough, SOB, DOE, wheezing,  hemoptysis CARDIAC: no chest pain, edema or palpitations GI: no abdominal pain, diarrhea, constipation, heart burn, nausea or vomiting GU: Denies dysuria, frequency, hematuria, incontinence, or discharge PSYCHIATRIC: Denies feeling of depression or anxiety. No report of hallucinations, insomnia, paranoia, or agitation   PHYSICAL EXAMINATION  GENERAL APPEARANCE: Well nourished. In no acute distress. Obese SKIN:  Left posterior heel wound HEAD: Normal in size and contour. No evidence of trauma EYES: Lids open and close normally. No blepharitis, entropion or ectropion. PERRL. Conjunctivae are clear and sclerae are white. Lenses are without opacity EARS: Pinnae are normal. Patient hears normal voice tunes of the examiner MOUTH  and THROAT: Lips are without lesions. Oral mucosa is moist and without lesions. Tongue is normal in shape, size, and color and without lesions NECK: supple, trachea midline, no neck masses, no thyroid tenderness, no thyromegaly LYMPHATICS: no LAN in the neck, no supraclavicular LAN RESPIRATORY: breathing is even & unlabored, BS CTAB CARDIAC: RRR, no murmur,no extra heart sounds, no edema GI: abdomen soft, normal BS, no masses, no tenderness, no hepatomegaly, no splenomegaly EXTREMITIES:  Able to move 4 extremities PSYCHIATRIC: Alert and oriented X 3. Affect and behavior are appropriate  LABS/RADIOLOGY: Labs reviewed: Basic Metabolic Panel:  Recent Labs  12/29/14 1538 12/29/14 2250 12/30/14 1148 01/01/15 0500 01/04/15  NA 134*  --  136 134* 133*  K 5.2*  --  4.0 3.9 4.2  CL 105  --  102 101  --   CO2 22  --  25 25  --   GLUCOSE 515*  --  345* 341*  --   BUN 20  --  16 15  --   CREATININE 0.79 0.84 0.81 0.78  --   CALCIUM 8.7*  --  8.2* 8.4*  --    Liver Function Tests:  Recent Labs  09/27/14 1830 12/29/14 1538  AST 22 14*  ALT 24 15  ALKPHOS 159* 134*  BILITOT 2.3* 2.1*  PROT 6.9 7.2  ALBUMIN 4.0 4.3    Recent Labs  09/27/14 1830  LIPASE  13   CBC:  Recent Labs  09/27/14 1830 10/31/14 1400  12/29/14 2250 12/31/14 1202 01/01/15 0500 01/15/15  WBC 10.9* 3.6*  < > 7.3 7.7 7.0 5.5  NEUTROABS 10.2* 2.9  --   --   --   --   --   HGB 13.2 10.9*  < > 11.2* 10.2* 10.2* 8.9*  HCT 40.6 33.4*  < > 33.7* 30.7* 29.8* 27*  MCV 87.3 82.1  < > 83.0 81.2 81.9  --   PLT 205 130*  < > 151 137* 142* 217  < > = values in this interval not displayed.   Cardiac Enzymes:  Recent Labs  11/02/14 0759 11/02/14 1503 11/02/14 2006  TROPONINI <0.03 0.03 <0.03   CBG:  Recent Labs  01/01/15 0421 01/01/15 0817 01/01/15 1145  GLUCAP 312* 279* 369*     ASSESSMENT/PLAN:  Avulsion fracture, posterior aspect, left calcaneus S/P ORIF - continue oxycodone 5 mg 1-2 tabs by mouth every 3 hours when necessary for pain; Robaxin 500 mg 1 tab by mouth every 6 hours when necessary and soma 350 mg 1/2 - 1 tab by mouth daily when necessary for muscle spasm; follow-up with Dr. Wynelle Link, orthopedic surgeon.  Anemia, acute blood loss - hemoglobin 8.9; stable  Hypertension - continue Norvasc 5 mg 1 tab by mouth daily and lisinopril 40 mg 1 tab by mouth daily  Hypothyroidism - continue Synthroid 125 g 1 tab by mouth daily and Synthroid 125 g 1/2 tab = 62.5 g by mouth every Sundays  Depression - mood this is stable; continue Paxil 30 mg 1 tab by mouth daily  Constipation - continue Colace 100 mg 1 capsule by mouth twice a day, perdiem over 15 mg 1 tab by mouth daily when necessary and MiraLAX 17 g by mouth daily when necessary  GERD - continue Prilosec 20 mg 1 capsule by mouth daily  Vertigo - continue Antivert 25 mg 1 tab by mouth twice a day  Hyperlipidemia - continue Zocor 20 mg 1 tab by mouth daily  Diabetes mellitus, type II - hemoglobin  A1c 7.6; continue Coumadin at 500 units/mL 50 units subcutaneous 3 times a day before meals and Humulin R 100 units per mL sliding scale 3 times a day before meals  Allergic rhinitis - continue Allegra  180 mg 1 tab by mouth daily when necessary and Flonase 50 g/ACT 2 sprays into the nose twice a day when necessary  Leg edema - continue Lasix 20 mg 1 tab by mouth twice a day when necessary     I have filled out patient's discharge paperwork and written prescriptions.  Patient will receive home health PT, Nursing and Home health aide.  DME provided:  Rolling walker with 3" wheels  Total discharge time: Greater than 30 minutes  Discharge time involved coordination of the discharge process with social worker, nursing staff and therapy department. Medical justification for home health services/DME verified.    [facility], NP [employer] [phone]

## 2015-02-22 ENCOUNTER — Other Ambulatory Visit: Payer: Self-pay | Admitting: Internal Medicine

## 2015-04-15 ENCOUNTER — Telehealth: Payer: Self-pay | Admitting: Cardiovascular Disease

## 2015-04-15 NOTE — Telephone Encounter (Signed)
   Pt c/o BP issue: STAT if pt c/o blurred vision, one-sided weakness or slurred speech  1. What are your last 5 BP readings? 210/90  2. Are you having any other symptoms (ex. Dizziness, headache, blurred vision, passed out)? Headaches,   3. What is your BP issue? Too high

## 2015-04-15 NOTE — Telephone Encounter (Signed)
Returned call to patient.She stated her B/P and pulse have been elevated for the past 3 weeks.Stated she has had chest pain and a headache.No chest now,has a mild headache. Stated she saw PCP last week and he changed medications.B/P today 174/72 pulse 93.Stated she would like appointment with Dr.McAlhany.Advised Dr.McAlhany's schedule is full.Appointment scheduled with Cecilie Kicks NP 04/20/15 at 3:15 pm.Advised to go to ER if symptoms worsen.

## 2015-04-20 ENCOUNTER — Ambulatory Visit (INDEPENDENT_AMBULATORY_CARE_PROVIDER_SITE_OTHER): Payer: Medicare Other | Admitting: Cardiology

## 2015-04-20 ENCOUNTER — Encounter: Payer: Self-pay | Admitting: Cardiology

## 2015-04-20 VITALS — BP 160/70 | HR 81 | Ht 68.0 in | Wt 211.0 lb

## 2015-04-20 DIAGNOSIS — I10 Essential (primary) hypertension: Secondary | ICD-10-CM

## 2015-04-20 DIAGNOSIS — R0600 Dyspnea, unspecified: Secondary | ICD-10-CM

## 2015-04-20 DIAGNOSIS — R0989 Other specified symptoms and signs involving the circulatory and respiratory systems: Secondary | ICD-10-CM

## 2015-04-20 DIAGNOSIS — R072 Precordial pain: Secondary | ICD-10-CM

## 2015-04-20 DIAGNOSIS — I517 Cardiomegaly: Secondary | ICD-10-CM

## 2015-04-20 DIAGNOSIS — D649 Anemia, unspecified: Secondary | ICD-10-CM

## 2015-04-20 MED ORDER — AMLODIPINE BESYLATE 10 MG PO TABS: 10.0000 mg | ORAL_TABLET | Freq: Every day | ORAL | Status: DC

## 2015-04-20 NOTE — Progress Notes (Signed)
Cardiology Office Note   Date:  04/20/2015   ID:  Jaclyn Herrera, DOB February 24, 1946, MRN [id number]  PCP:  Asencion Noble, MD  Cardiologist:  Dr. Angelena Form    Chief Complaint  Patient presents with  . Hypertension  . Chest Pain      History of Present Illness: Jaclyn Herrera is a 69 y.o. female who presents for HTN, chest pain despite BP med adjustment by PCP.  She has hx of DM, HTN, HLD, hypothyroidism, GERD, anemia  Previous cath was normal.  Echo 05/15/13 with moderate LVH, normal LV function, grade 2 diastolic dysfunction, mild MR. Lexiscan stress myoview 05/22/13 with no ischemia.  Recent hospitalizations with DKA and recent Avulsion fracture, posterior aspect, left calcaneus, OPEN REDUCTION INTERNAL FIXATION LEFT CALCANEOUS FRACTURE (Left)  End of July.  Over the last 3 weeks she has elevated BP despite taking her meds and a sharp pain from Rt chest to back that is increased with deep breath. It has awakened from sleep.  Her meds were adjusted by PCP but she did not feel the diuretic chorthalidone  was helping and was causing swelling of hands so she stopped. She also has not recently taken the lasix.    She ambulates without problems using her walker. Her lt foot has an ulcer and is followed by Dr. Wynelle Link and home health.     Past Medical History  Diagnosis Date  . Diabetes mellitus   . Hypertension   . Arthritis   . Shingles   . hypothyroidism   . Bronchitis   . Hyperlipidemia   . GERD (gastroesophageal reflux disease)   . PONV (postoperative nausea and vomiting)     Past Surgical History  Procedure Laterality Date  . Cholecystectomy    . Ankle surgery      x 4  . Non cancerous mass removed      small intestine  . Tonsillectomy    . Nasal sinus surgery    . Cataract extraction Bilateral   . Appendectomy    . Knee arthroscopy    . Orif calcaneous fracture Left 12/29/2014    Procedure: OPEN REDUCTION INTERNAL FIXATION  LEFT CALCANEOUS FRACTURE;  Surgeon: Gaynelle Arabian, MD;  Location: [facility];  Service: Orthopedics;  Laterality: Left;     Current Outpatient Prescriptions  Medication Sig Dispense Refill  . acetaminophen (TYLENOL) 500 MG tablet Take 500-1,000 mg by mouth every 6 (six) hours as needed for headache.     . albuterol (PROVENTIL HFA;VENTOLIN HFA) 108 (90 BASE) MCG/ACT inhaler Inhale 2 puffs into the lungs every 4 (four) hours as needed for wheezing or shortness of breath.    Marland Kitchen amLODipine (NORVASC) 5 MG tablet Take 1 tablet (5 mg total) by mouth daily. 30 tablet 12  . bisacodyl (DULCOLAX) 10 MG suppository Place 1 suppository (10 mg total) rectally daily as needed for moderate constipation. 12 suppository 0  . carisoprodol (SOMA) 350 MG tablet Take 175-350 mg by mouth daily as needed for muscle spasms. For cramps    . docusate sodium (COLACE) 100 MG capsule Take 1 capsule (100 mg total) by mouth 2 (two) times daily. 60 capsule 0  . esomeprazole (NEXIUM) 20 MG capsule Take 20 mg by mouth daily at 12 noon.    . fexofenadine (ALLEGRA) 180 MG tablet Take 180 mg by mouth daily as needed for allergies or rhinitis.    . fluticasone (FLONASE) 50 MCG/ACT nasal spray Place 2 sprays into the nose 2 (two) times daily as  needed for allergies.     . furosemide (LASIX) 20 MG tablet Take 20 mg by mouth 2 (two) times daily as needed for fluid.     Marland Kitchen insulin regular human CONCENTRATED (HUMULIN R) 500 UNIT/ML SOLN injection Inject 60-150 Units into the skin 3 (three) times daily before meals. Sliding scale range is reported 60-150 units based on a sliding scale. She takes it 30 minutes before meals.    Marland Kitchen levothyroxine (SYNTHROID, LEVOTHROID) 125 MCG tablet Take 125-187.5 mcg by mouth daily. Takes 125 mcg daily. On Sundays she takes 187.5 mcg    . lisinopril (PRINIVIL,ZESTRIL) 40 MG tablet Take 40 mg by mouth daily.    . meclizine (ANTIVERT) 25 MG tablet Take 25 mg by mouth 2 (two) times daily.    . methocarbamol (ROBAXIN) 500 MG tablet Take 1 tablet (500 mg total)  by mouth every 6 (six) hours as needed for muscle spasms. 80 tablet 0  . metoCLOPramide (REGLAN) 5 MG tablet Take 1 tablet (5 mg total) by mouth every 8 (eight) hours as needed for nausea (if ondansetron (ZOFRAN) ineffective.). 40 tablet 0  . mometasone (ELOCON) 0.1 % cream Apply 1 application topically daily.     . Multiple Vitamins-Minerals (DECUBI-VITE PO) Take by mouth daily. For wound healing    . ondansetron (ZOFRAN) 4 MG tablet Take 1 tablet (4 mg total) by mouth every 6 (six) hours as needed for nausea. 40 tablet 0  . oxyCODONE (OXY IR/ROXICODONE) 5 MG immediate release tablet Take 1-2 tablets (5-10 mg total) by mouth every 3 (three) hours as needed for moderate pain or severe pain. 80 tablet 0  . PARoxetine (PAXIL) 30 MG tablet Take 30 mg by mouth daily.    . polyethylene glycol (MIRALAX / GLYCOLAX) packet Take 17 g by mouth daily as needed for mild constipation. 14 each 0  . Polyethylene Glycol 400 (BLINK TEARS OP) Place 1 drop into both eyes 4 (four) times daily as needed (Dry eyes).     . Senna-Psyllium (PERDIEM PO) Take 1 tablet by mouth daily as needed (constipation).     . simvastatin (ZOCOR) 20 MG tablet Take 20 mg by mouth at bedtime.       No current facility-administered medications for this visit.    Allergies:   Sulfonamide derivatives; Bactrim; Erythromycin; Mucinex; Aspirin; Levofloxacin; and Tape    Social History:  The patient  reports that she has never smoked. She has never used smokeless tobacco. She reports that she does not drink alcohol or use illicit drugs.   Family History:  The patient's family history includes Asthma in her mother and sister; CAD in her brother; Diabetes in her brother, father, and mother; Heart disease in her father and sister; Hypertension in her father and sister; Stroke in her brother.    ROS:  General:no colds or fevers, weight has decreased.   Skin:no rashes + lt foot ulcer HEENT:no blurred vision, no congestion CV:see HPI PUL:see  HPI GI:no diarrhea constipation or melena, no indigestion GU:no hematuria, no dysuria MS:no joint pain, no claudication Neuro:no syncope, no lightheadedness Endo:+ diabetes, no thyroid disease  Wt Readings from Last 3 Encounters:  04/20/15 211 lb (95.709 kg)  02/12/15 223 lb 9.6 oz (101.424 kg)  12/29/14 220 lb (99.791 kg)     PHYSICAL EXAM: VS:  BP 160/70 mmHg  Pulse 81  Ht 5\' 8"  (1.727 m)  Wt 211 lb (95.709 kg)  BMI 32.09 kg/m2 , BMI Body mass index is 32.09 kg/(m^2). General:Pleasant affect,  NAD Skin:Warm and dry, brisk capillary refill HEENT:normocephalic, sclera clear, mucus membranes moist Neck:supple, no JVD, no bruits  Heart:S1S2 RRR without murmur, gallup, rub or click, + pain on rt chest with palpation Lungs:clear without rales, rhonchi, or wheezes [id number], non tender, + BS, do not palpate liver spleen or masses Ext:1-2+ lower ext edema Lt > RT no pain to palpation on lt calf.  2+ radial pulses, scars on rt leg from previous surgery. Neuro:alert and oriented X 3, MAE, follows commands, + facial symmetry    EKG:  EKG is ordered today. The ekg ordered today demonstrates SR no acute changes from    Recent Labs: 12/29/2014: ALT 15 01/01/2015: BUN 15; Creatinine, Ser 0.78 01/04/2015: Potassium 4.2; Sodium 133* 01/15/2015: Hemoglobin 8.9*; Platelets 217    Lipid Panel No results found for: CHOL, TRIG, HDL, CHOLHDL, VLDL, LDLCALC, LDLDIRECT     Other studies Reviewed: Additional studies/ records that were reviewed today include:   ECHO:. Study Conclusions  - Left ventricle: The cavity size was normal. There was moderate concentric hypertrophy. Systolic function was normal. The estimated ejection fraction was in the range of 60% to 65%. Wall motion was normal; there were no regional wall motion abnormalities. Features are consistent with a pseudonormal left ventricular filling pattern, with concomitant abnormal relaxation and increased filling  pressure (grade 2 diastolic dysfunction). Doppler parameters are consistent with elevated ventricular end-diastolic filling pressure. - Aortic valve: Mildly calcified annulus. - Mitral valve: Calcified annulus. Mildly thickened leaflets . Mild regurgitation. - Left atrium: The atrium was mildly dilated. - Atrial septum: No defect or patent foramen ovale was identified.  nuc 2014 :no ischemia  Carotid dopplers:  IMPRESSION: Color duplex indicates minimal heterogeneous and calcified plaque, with no hemodynamically significant stenosis by duplex criteria in the extracranial cerebrovascular circulation.  Thickening of the bilateral common carotid artery intima, which has been shown to be a predictor for future cardiovascular events  ASSESSMENT AND PLAN:  1.  HTN: will increase amlodipine to 10 mg daily, if she has edema she will take lasix 20 mg daily to every other day.  If BP continues to be elevated will add BB. She will call her bps to [place].  2. Chest pain.most likely muscular skeletal but will check ddimer,  And with DM will check lexiscan myoview.  Will check BMP and CBC- last hgb 8.9 she will follow up with Dr. Angelena Form in 2 weeks.  3.  Foot ulcer, follow with ortho  4.  Anemia will check CBC.   Current medicines are reviewed with the patient today.  The patient Has no concerns regarding medicines.  The following changes have been made:  See above Labs/ tests ordered today include:see above  Disposition:   FU:  see above  Signed, Isaiah Serge, NP  04/20/2015 4:59 PM    [facility] [place], [place], [place], [place] Phone: [phone]; Fax: [phone]

## 2015-04-20 NOTE — Patient Instructions (Addendum)
Medication Instructions:  1) Take lasix 20 mg daily if legs swell and if too much then 20 mg every other day.  2)  INCREASE your Amlodipine to 10mg  once daily.  Labwork: DDimer, CBC and BMET tomorrow in   Testing/Procedures: Your physician has requested that you have a lexiscan myoview. For further information please visit [email or web address]. Please follow instruction sheet, as given.   Follow-Up: Your physician recommends that you schedule a follow-up appointment in: 2 weeks with Dr. Angelena Form or a PA/NP on a day that Dr. Angelena Form is in the office.    Any Other Special Instructions Will Be Listed Below (If Applicable).     If you need a refill on your cardiac medications before your next appointment, please call your pharmacy.

## 2015-04-22 ENCOUNTER — Other Ambulatory Visit: Payer: Self-pay | Admitting: Cardiology

## 2015-04-22 ENCOUNTER — Telehealth: Payer: Self-pay

## 2015-04-22 DIAGNOSIS — D649 Anemia, unspecified: Secondary | ICD-10-CM

## 2015-04-22 DIAGNOSIS — R7989 Other specified abnormal findings of blood chemistry: Secondary | ICD-10-CM

## 2015-04-22 DIAGNOSIS — I10 Essential (primary) hypertension: Secondary | ICD-10-CM

## 2015-04-22 DIAGNOSIS — R072 Precordial pain: Secondary | ICD-10-CM

## 2015-04-22 LAB — D-DIMER, QUANTITATIVE: D-Dimer, Quant: 0.82 ug/mL-FEU — ABNORMAL HIGH (ref 0.00–0.48)

## 2015-04-22 LAB — CBC
HEMATOCRIT: 32.8 % — AB (ref 36.0–46.0)
HEMOGLOBIN: 10.8 g/dL — AB (ref 12.0–15.0)
MCH: 25.3 pg — ABNORMAL LOW (ref 26.0–34.0)
MCHC: 32.9 g/dL (ref 30.0–36.0)
MCV: 76.8 fL — ABNORMAL LOW (ref 78.0–100.0)
MPV: 9.6 fL (ref 8.6–12.4)
Platelets: 281 10*3/uL (ref 150–400)
RBC: 4.27 MIL/uL (ref 3.87–5.11)
RDW: 17 % — ABNORMAL HIGH (ref 11.5–15.5)
WBC: 7.3 10*3/uL (ref 4.0–10.5)

## 2015-04-22 LAB — BASIC METABOLIC PANEL
BUN: 27 mg/dL — ABNORMAL HIGH (ref 7–25)
CO2: 30 mmol/L (ref 20–31)
CREATININE: 0.8 mg/dL (ref 0.60–0.93)
Calcium: 9.1 mg/dL (ref 8.6–10.4)
Chloride: 98 mmol/L (ref 98–110)
GLUCOSE: 188 mg/dL — AB (ref 65–99)
POTASSIUM: 4.2 mmol/L (ref 3.5–5.3)
Sodium: 137 mmol/L (ref 135–146)

## 2015-04-22 NOTE — Telephone Encounter (Signed)
-----   Message from Isaiah Serge, NP sent at 04/22/2015  7:56 AM EST ----- Please call and remind pt to have her labs done.  Thanks.

## 2015-04-22 NOTE — Telephone Encounter (Signed)
Left message reminding patient to get her labs today.

## 2015-04-23 ENCOUNTER — Telehealth: Payer: Self-pay

## 2015-04-23 ENCOUNTER — Ambulatory Visit (INDEPENDENT_AMBULATORY_CARE_PROVIDER_SITE_OTHER)
Admission: RE | Admit: 2015-04-23 | Discharge: 2015-04-23 | Disposition: A | Discharge status: Home / Self Care | Payer: Medicare Other | Source: Ambulatory Visit | Attending: Cardiology | Admitting: Cardiology

## 2015-04-23 DIAGNOSIS — R9389 Abnormal findings on diagnostic imaging of other specified body structures: Secondary | ICD-10-CM

## 2015-04-23 DIAGNOSIS — R791 Abnormal coagulation profile: Secondary | ICD-10-CM

## 2015-04-23 DIAGNOSIS — R7989 Other specified abnormal findings of blood chemistry: Secondary | ICD-10-CM

## 2015-04-23 MED ORDER — IOHEXOL 350 MG/ML SOLN: 100.0000 mL | Freq: Once | INTRAVENOUS | Status: DC | PRN

## 2015-04-23 NOTE — Telephone Encounter (Signed)
Dr. Johnsie Cancel discussed CT with tech and pt, he recommended thoracentesis for pt's symptoms of rt side chest pain.  This is to be scheduled.

## 2015-04-23 NOTE — Telephone Encounter (Signed)
Patient needs referral.

## 2015-04-23 NOTE — Telephone Encounter (Signed)
Called patient about lab results. Will send message to [facility] to schedule.  Notes Recorded by Isaiah Serge, NP on 04/22/2015 at 11:09 PM D dimer is elevated, please have pt have CT angio of chest 04/23/15 to rule out PE. This will show if pain due to clot or if fluid in lung or any other issues. Thanks.

## 2015-04-23 NOTE — Telephone Encounter (Signed)
Patient's CT completed. No evidence of PE, but patient has right pleural effusion, per DOD and CT tech. Called Cecilie Kicks NP, she advised to have patient see interventional radiology for possible thoracentesis. Sent message to [facility] to schedule.

## 2015-04-26 ENCOUNTER — Other Ambulatory Visit: Payer: Self-pay | Admitting: Cardiovascular Disease

## 2015-04-26 DIAGNOSIS — J90 Pleural effusion, not elsewhere classified: Secondary | ICD-10-CM

## 2015-04-27 ENCOUNTER — Telehealth: Payer: Self-pay | Admitting: Cardiovascular Disease

## 2015-04-27 ENCOUNTER — Telehealth: Payer: Self-pay

## 2015-04-27 ENCOUNTER — Inpatient Hospital Stay: Admission: RE | Admit: 2015-04-27 | Payer: Medicare Other | Source: Ambulatory Visit

## 2015-04-27 ENCOUNTER — Other Ambulatory Visit: Payer: Self-pay

## 2015-04-27 DIAGNOSIS — R0602 Shortness of breath: Secondary | ICD-10-CM

## 2015-04-27 DIAGNOSIS — J90 Pleural effusion, not elsewhere classified: Secondary | ICD-10-CM

## 2015-04-27 NOTE — Telephone Encounter (Signed)
Patient called the office about the call she received from Dr. Angelena Form. Informed patient that he wanted her to be referred to Pulmonary for evaluation. Referral has been ordered. Patient stated " I am not happy about this I need to know exactly what I need to do". Patient wants to know if she still needs stress test on Thursday. Informed patient that message would be sent to Dr. Angelena Form for his advisement. Patient verbalized understanding.

## 2015-04-27 NOTE — Telephone Encounter (Signed)
Due to patient's abnormal CT patient is ordered a decubitus chest xray on right side per Cecilie Kicks. Will send message to [facility] to schedule.

## 2015-04-27 NOTE — Telephone Encounter (Signed)
Canceled chest xray, patient wants test done at [facility].

## 2015-04-27 NOTE — Telephone Encounter (Signed)
See office note Jaclyn Kicks, NP. She has bilateral pleural effusions. She needs referral to Pulmonary to evaluate. I left a message. Will try back later.

## 2015-04-27 NOTE — Telephone Encounter (Signed)
I spoke to the patient tonight. I explained that she needs a pulmonary referral. She is coming in for x-ray tomorrow. Can we call the pulmonary office and ask if they can see her this week? I will call you in the am Pat to discuss. We can also cancel her stress myoview that was planned on Thursday as her pleuritic chest pain is likely not cardiac.   Thanks, Gerald Stabs

## 2015-04-28 NOTE — Telephone Encounter (Signed)
Dr. Angelena Form has spoken with Dr. Elsworth Soho and he will see pt within the next week.  I spoke with pt and gave her this information.  I told her to expect a call from the pulmonary office with appt information. Pt aware stress test in our office has been cancelled.

## 2015-04-29 ENCOUNTER — Telehealth: Payer: Self-pay | Admitting: Pulmonary Disease

## 2015-04-29 ENCOUNTER — Telehealth: Payer: Self-pay | Admitting: Cardiovascular Disease

## 2015-04-29 ENCOUNTER — Encounter (HOSPITAL_COMMUNITY): Payer: Medicare Other

## 2015-04-29 NOTE — Telephone Encounter (Signed)
To Dr. Elsworth Soho, please advise.Marland Kitchen Pt is on list for next available day in November.  Should I try to schedule her with another provider? Or wait until your next available appointment?  Pt did not mention fluid build up in her lungs when I spoke to her on the phone 11/14 to schedule appointment on 11/16.  Don't know anything about anyone calling "same day" for appointment.  I called her on 11/14 when clinic opened to schedule appointment and she denied appointment at that time because her nurse was coming to tend to her broken foot.    RA - please advise.

## 2015-04-29 NOTE — Telephone Encounter (Signed)
She has fluid building up on her lungs and is afraid because she was not able to come in yesterday when she was called that she may have to wait a while with the fluid building up in her lungs, please advise

## 2015-04-29 NOTE — Telephone Encounter (Signed)
I returned call to pulmonary office. Janett Billow did not call our office.  Call was from [place] regarding appt with Dr. Elsworth Soho.  (see other phone note dated 04/29/15).

## 2015-04-29 NOTE — Telephone Encounter (Signed)
Called Dr. Camillia Herter office and spoke to registrar and informed him Dr. Elsworth Soho does not have any openings this week and we would call back with an appt. Per Dr. Camillia Herter note, ideally the pt need to be worked in this week.   Dr. Elsworth Soho please advise if you have spoken to Dr. Angelena Form and where pt can be worked in. Thanks.

## 2015-04-29 NOTE — Telephone Encounter (Signed)
Attempted to get patient in 11/16, however, she was unable to come in today, she says that she has a broken foot and her nurse is coming to help with her broken foot today.   Sharyn Lull, pl put her on call list for next week

## 2015-04-29 NOTE — Telephone Encounter (Signed)
New Message  Jessica from Dr Elsworth Soho office ([facility] pul[facility]) returning RN phone call. Janett Billow stated- trying to have pt come in ASAP- waiting word from Dr Elsworth Soho. Please call back and discuss.

## 2015-04-29 NOTE — Telephone Encounter (Signed)
Patient put on Nov List to schedule appointment. Nothing further needed. Closing encounter

## 2015-04-29 NOTE — Telephone Encounter (Signed)
Called and spoke to pt. Pt states her SOB is unchanged, not any worse. Pt states she is having an intermittent dry hackey cough, unchanged. Advised pt we are able to see her next week, pt very pleased. Pt stated she is able to wait till next week appt. Advised pt to call back if her s/s worsen. Pt verbalized understanding and denied any further questions or concerns at this time.    Glean Hess, CMA at 04/29/2015 11:04 AM     Status: Signed       Expand All Collapse All   Patient put on Nov List to schedule appointment. Nothing further needed. Closing encounter             Rigoberto Noel, MD at 04/29/2015 10:38 AM     Status: Signed       Expand All Collapse All    Attempted to get patient in 11/16, however, she was unable to come in today, she says that she has a broken foot and her nurse is coming to help with her broken foot today.   Sharyn Lull, pl put her on call list for next week

## 2015-05-03 ENCOUNTER — Ambulatory Visit (INDEPENDENT_AMBULATORY_CARE_PROVIDER_SITE_OTHER)
Admission: RE | Admit: 2015-05-03 | Discharge: 2015-05-03 | Disposition: A | Discharge status: Home / Self Care | Payer: Medicare Other | Source: Ambulatory Visit | Attending: Pulmonary Disease | Admitting: Pulmonary Disease

## 2015-05-03 ENCOUNTER — Ambulatory Visit (INDEPENDENT_AMBULATORY_CARE_PROVIDER_SITE_OTHER): Payer: Medicare Other | Admitting: Pulmonary Disease

## 2015-05-03 ENCOUNTER — Other Ambulatory Visit (INDEPENDENT_AMBULATORY_CARE_PROVIDER_SITE_OTHER): Payer: Medicare Other

## 2015-05-03 ENCOUNTER — Encounter: Payer: Self-pay | Admitting: Pulmonary Disease

## 2015-05-03 VITALS — BP 182/88 | HR 83 | Ht 68.0 in | Wt 220.4 lb

## 2015-05-03 DIAGNOSIS — J90 Pleural effusion, not elsewhere classified: Secondary | ICD-10-CM | POA: Diagnosis not present

## 2015-05-03 LAB — BASIC METABOLIC PANEL
BUN: 17 mg/dL (ref 6–23)
CALCIUM: 8.8 mg/dL (ref 8.4–10.5)
CO2: 23 mEq/L (ref 19–32)
CREATININE: 0.78 mg/dL (ref 0.40–1.20)
Chloride: 100 mEq/L (ref 96–112)
GFR: 77.49 mL/min (ref >60.00)
GLUCOSE: 350 mg/dL — AB (ref 70–99)
Potassium: 4.2 mEq/L (ref 3.5–5.1)
SODIUM: 135 meq/L (ref 135–145)

## 2015-05-03 LAB — BRAIN NATRIURETIC PEPTIDE: Pro B Natriuretic peptide (BNP): 199 pg/mL — ABNORMAL HIGH (ref 0.0–100.0)

## 2015-05-03 NOTE — Progress Notes (Signed)
   Subjective:    Patient ID: Jaclyn Herrera, female    DOB: 07-Oct-1945, 69 y.o.   MRN: [id number]  HPI    Review of Systems  Constitutional: Negative for fever, chills and unexpected weight change.  HENT: Negative for congestion, dental problem, ear pain, nosebleeds, postnasal drip, rhinorrhea, sinus pressure, sneezing, sore throat, trouble swallowing and voice change.   Eyes: Negative for visual disturbance.  Respiratory: Positive for shortness of breath. Negative for cough and choking.   Cardiovascular: Negative for chest pain and leg swelling.  Gastrointestinal: Negative for vomiting, abdominal pain and diarrhea.  Genitourinary: Negative for difficulty urinating.  Musculoskeletal: Negative for arthralgias.  Skin: Negative for rash.  Neurological: Negative for tremors, syncope and headaches.  Hematological: Does not bruise/bleed easily.       Objective:   Physical Exam        Assessment & Plan:

## 2015-05-03 NOTE — Progress Notes (Signed)
Subjective:    Patient ID: Jaclyn Herrera, female    DOB: 1946-01-06, 69 y.o.   MRN: [id number]  HPI  PCP - fagan  69 year old never smoker referred by cardiology for evaluation of pleural effusions. She retired and moved to [place] after her husband's demise 12 years ago and has now moved back to [name]. She had an accidental fall and developed left calcaneal fracture, required a boot ([name]) and unfortunately developed a heel ulcer. About 2 weeks ago, she developed sudden onset stabbing substernal chest pain nonradiating and was seen by cardiology. Due to positive d-dimer, she underwent CT angiogram 04/23/15 which was negative for pulmonary embolism but showed bilateral effusions, right more than left. She was placed on Lasix but has been unable to take this much due to muscle cramps. She reports severe cramps lasting an hour or 2 which prevent her from moving.  Echo 05/2013 showed normal ejection fraction grade 2 diastolic dysfunction. Myocardial perfusion imaging is planned. She reports bipedal edema. Heel ulcer is finally healing. She denies orthopnea or paroxysmal nocturnal dyspnea. She denies cough. Dyspnea is difficult to assess since she has been using a walker since her foot injury She has poorly controlled insulin-requiring diabetes Prior imaging studies were reviewed and did not show pleural effusions  Labs-normal renal function, normal potassium, BNP 199  Chest x-ray shows small effusions, right more than left   Past Medical History  Diagnosis Date  . Diabetes mellitus   . Hypertension   . Arthritis   . Shingles   . hypothyroidism   . Bronchitis   . Hyperlipidemia   . GERD (gastroesophageal reflux disease)   . PONV (postoperative nausea and vomiting)     Past Surgical History  Procedure Laterality Date  . Cholecystectomy    . Ankle surgery      x 4  . Non cancerous mass removed      small intestine  . Tonsillectomy    . Nasal sinus surgery    .  Cataract extraction Bilateral   . Appendectomy    . Knee arthroscopy    . Orif calcaneous fracture Left 12/29/2014    Procedure: OPEN REDUCTION INTERNAL FIXATION  LEFT CALCANEOUS FRACTURE;  Surgeon: Gaynelle Arabian, MD;  Location: [facility];  Service: Orthopedics;  Laterality: Left;   Allergies  Allergen Reactions  . Sulfonamide Derivatives Anaphylaxis  . Bactrim [Sulfamethoxazole-Trimethoprim] Nausea Only  . Erythromycin Nausea And Vomiting  . Mucinex [Guaifenesin Er] Nausea And Vomiting  . Aspirin Rash  . Levofloxacin Rash  . Tape Rash   Social History   Social History  . Marital Status: Widowed    Spouse Name: N/A  . Number of Children: N/A  . Years of Education: N/A   Occupational History  . Retired-[employer]    Social History Main Topics  . Smoking status: Never Smoker   . Smokeless tobacco: Never Used  . Alcohol Use: No  . Drug Use: No  . Sexual Activity: Not Currently   Other Topics Concern  . Not on file   Social History Narrative    Family History  Problem Relation Age of Onset  . Diabetes Mother   . Asthma Mother   . Hypertension Father   . Heart disease Father   . Diabetes Father   . Heart disease Sister   . Hypertension Sister   . Asthma Sister   . Diabetes Brother   . CAD Brother   . Stroke Brother      Review of Systems  neg for any significant sore throat, dysphagia, itching, sneezing, nasal congestion or excess/ purulent secretions, fever, chills, sweats, unintended wt loss, pleuritic or exertional cp, hempoptysis, orthopnea pnd or change in chronic leg swelling. Also denies presyncope, palpitations, heartburn, abdominal pain, nausea, vomiting, diarrhea or change in bowel or urinary habits, dysuria,hematuria, rash, arthralgias, visual complaints, headache, numbness weakness or ataxia.     Objective:   Physical Exam  Gen. Pleasant, obese, in no distress, normal affect ENT - no lesions, no post nasal drip, class 2 airway Neck: No JVD, no thyromegaly,  no carotid bruits Lungs: no use of accessory muscles, no dullness to percussion, decreased rt base without rales or rhonchi  Cardiovascular: Rhythm regular, heart sounds  normal, no murmurs or gallops, no peripheral edema Abdomen: soft and non-tender, no hepatosplenomegaly, BS normal. Musculoskeletal: No deformities, no cyanosis or clubbing Neuro:  alert, non focal, no tremors       Assessment & Plan:

## 2015-05-03 NOTE — Patient Instructions (Signed)
CXR today Blood work Based on this we will decide whether lasix will be enough or we need to draw fluid out - procedure was discussed

## 2015-05-03 NOTE — Assessment & Plan Note (Signed)
Etiology is unclear-For bilateral effusions, we have to consider diastolic heart failure or renal etiology-could she have nephrotic syndrome due to uncontrolled diabetes. Chance of malignancy is low in this never smoker She really hasn't taken the Lasix due to muscle cramps Pleural effusions appear small and chest x-ray Suggest -Take Lasix more consistently for 2 weeks with potassium supplementation -Repeat chest x-ray in 2 weeks, if effusion is persistent then we'll consider thoracentesis

## 2015-05-04 ENCOUNTER — Other Ambulatory Visit: Payer: Self-pay | Admitting: *Deleted

## 2015-05-04 DIAGNOSIS — J90 Pleural effusion, not elsewhere classified: Secondary | ICD-10-CM

## 2015-05-04 MED ORDER — POTASSIUM CHLORIDE ER 10 MEQ PO TBCR: 10.0000 meq | EXTENDED_RELEASE_TABLET | Freq: Every day | ORAL | Status: DC

## 2015-05-10 ENCOUNTER — Telehealth: Payer: Self-pay | Admitting: Cardiovascular Disease

## 2015-05-10 ENCOUNTER — Telehealth: Payer: Self-pay | Admitting: Pulmonary Disease

## 2015-05-10 NOTE — Telephone Encounter (Signed)
New MEssage  Pt wanted to see if her f/u w/ [facility] is necessary for 05/12/15. Please call back and discuss.

## 2015-05-10 NOTE — Telephone Encounter (Signed)
Spoke with pt. Appt on 11/30 was to follow up on stress test.  Stress test was cancelled after pt found to have pleural effusions.  She is seeing pulmonary.  Pt reports blood pressure has been elevated at times but she is seeing primary care (Dr. Willey Blade) this week for follow up of blood pressure.  She would like to cancel appt here if not needed.  I told pt I would cancel appt for 05/12/15 with Richardson Dopp, PA and send message to Dr. Angelena Form to determine when she should be seen here again. Pt requests office note, lab results and tests ordered by our office to be sent to Dr. Willey Blade.  Will send these.

## 2015-05-10 NOTE — Telephone Encounter (Signed)
Patient calling to get notes faxed to Dr. Ria Comment office./  Patient has appointment with Dr. Willey Blade today. She needed last OV note, last labs, xray and CT results.  Faxed information to Dr. Ria Comment office per patient's request. Patient notified that information has been faxed. Nothing further needed. Closing encounter

## 2015-05-11 NOTE — Telephone Encounter (Signed)
Can we arrange f/u here in 2 weeks? Thanks, chris

## 2015-05-11 NOTE — Telephone Encounter (Signed)
I spoke with pt and made appt for her to see Ermalinda Barrios, PA on 05/31/15

## 2015-05-12 ENCOUNTER — Ambulatory Visit: Payer: Medicare Other | Admitting: Physician Assistant

## 2015-05-12 ENCOUNTER — Ambulatory Visit: Payer: Medicare Other | Admitting: Speech Pathology

## 2015-05-25 ENCOUNTER — Ambulatory Visit (INDEPENDENT_AMBULATORY_CARE_PROVIDER_SITE_OTHER): Payer: Medicare Other | Admitting: Adult Health

## 2015-05-25 ENCOUNTER — Encounter: Payer: Self-pay | Admitting: Adult Health

## 2015-05-25 ENCOUNTER — Ambulatory Visit (INDEPENDENT_AMBULATORY_CARE_PROVIDER_SITE_OTHER)
Admission: RE | Admit: 2015-05-25 | Discharge: 2015-05-25 | Disposition: A | Discharge status: Home / Self Care | Payer: Medicare Other | Source: Ambulatory Visit | Attending: Adult Health | Admitting: Adult Health

## 2015-05-25 VITALS — BP 138/78 | HR 82 | Temp 98.2°F | Ht 68.0 in | Wt 213.0 lb

## 2015-05-25 DIAGNOSIS — J90 Pleural effusion, not elsewhere classified: Secondary | ICD-10-CM | POA: Diagnosis not present

## 2015-05-25 NOTE — Assessment & Plan Note (Addendum)
Bilateral Pleural Effusion R>L ? Etiology  Unresponsive to lasix -has DCHF , most recent BNP mildly elevated at 199  CT chest neg for PE.   Will set pt up for a Dx /therapeutic  thoracentesis at IR w/ fluid analysis-cx /cytology  . - Not to remove >1.5L .  Case discussed with Dr. Melvyn Novas    Plan  Set up for Thoracentesis for right pleural effusion .  Continue on current regimen .  Follow up in 2-3 weeks with Dr. Elsworth Soho   chest xray .  Please contact office for sooner follow up if symptoms do not improve or worsen or seek emergency care

## 2015-05-25 NOTE — Patient Instructions (Addendum)
Set up for Thoracentesis for right pleural effusion .  Continue on current regimen .  Follow up in 2-3 weeks with Dr. Elsworth Soho   chest xray .  Please contact office for sooner follow up if symptoms do not improve or worsen or seek emergency care

## 2015-05-25 NOTE — Progress Notes (Signed)
Subjective:    Patient ID: Jaclyn Herrera, female    DOB: 1946/01/01, 69 y.o.   MRN: [id number]  HPI 69 yo female seen for pulmonary consult 05/03/15 for bilateral pleural effusions .     05/25/2015 Follow up Pleural Effusions Patient returns for a three-week follow-up for pleural effusions She was seen last visit for a pulmonary consult for bilateral pleural effusions.. Patient was recently noted to have progressively worsening shortness of breath . She was set up for a CT chest angiogram was negative for PE but showed bilateral effusions, right greater than left. She has known grade 2 diastolic dysfunction. She was started on Lasix.20mg  daily  Last visit. Patient was instructed to take Lasix on a regular basis She is taking lasix 20mg  daily . Marland Kitchen She returns today with only minimal improvement in symptoms.  She remains short of breath with activity. She has intermittent wheezing and dry cough. Chest x-ray today shows a moderate right pleural effusion and a small left pleural effusion. No previous hx of pleural effusions , cancer  .  Had Left foot fracture with surgery in August.  BNP was 199.     Past Medical History  Diagnosis Date  . Diabetes mellitus   . Hypertension   . Arthritis   . Shingles   . hypothyroidism   . Bronchitis   . Hyperlipidemia   . GERD (gastroesophageal reflux disease)   . PONV (postoperative nausea and vomiting)    Current Outpatient Prescriptions on File Prior to Visit  Medication Sig Dispense Refill  . acetaminophen (TYLENOL) 500 MG tablet Take 500-1,000 mg by mouth every 6 (six) hours as needed for headache.     . albuterol (PROVENTIL HFA;VENTOLIN HFA) 108 (90 BASE) MCG/ACT inhaler Inhale 2 puffs into the lungs every 4 (four) hours as needed for wheezing or shortness of breath.    Marland Kitchen amLODipine (NORVASC) 10 MG tablet Take 1 tablet (10 mg total) by mouth daily. 90 tablet 3  . bisacodyl (DULCOLAX) 10 MG suppository Place 1 suppository (10 mg total)  rectally daily as needed for moderate constipation. 12 suppository 0  . carisoprodol (SOMA) 350 MG tablet Take 175-350 mg by mouth daily as needed for muscle spasms. For cramps    . docusate sodium (COLACE) 100 MG capsule Take 1 capsule (100 mg total) by mouth 2 (two) times daily. 60 capsule 0  . esomeprazole (NEXIUM) 20 MG capsule Take 20 mg by mouth daily at 12 noon.    . fexofenadine (ALLEGRA) 180 MG tablet Take 180 mg by mouth daily as needed for allergies or rhinitis.    . fluticasone (FLONASE) 50 MCG/ACT nasal spray Place 2 sprays into the nose 2 (two) times daily as needed for allergies.     . furosemide (LASIX) 20 MG tablet Take 20 mg by mouth 2 (two) times daily as needed for fluid.     Marland Kitchen levothyroxine (SYNTHROID, LEVOTHROID) 125 MCG tablet Take 125-187.5 mcg by mouth daily. Takes 125 mcg daily. On Sundays she takes 187.5 mcg    . meclizine (ANTIVERT) 25 MG tablet Take 25 mg by mouth 2 (two) times daily.    . methocarbamol (ROBAXIN) 500 MG tablet Take 1 tablet (500 mg total) by mouth every 6 (six) hours as needed for muscle spasms. 80 tablet 0  . metoCLOPramide (REGLAN) 5 MG tablet Take 1 tablet (5 mg total) by mouth every 8 (eight) hours as needed for nausea (if ondansetron (ZOFRAN) ineffective.). 40 tablet 0  . mometasone (ELOCON)  0.1 % cream Apply 1 application topically daily.     . Multiple Vitamins-Minerals (DECUBI-VITE PO) Take by mouth daily. For wound healing    . ondansetron (ZOFRAN) 4 MG tablet Take 1 tablet (4 mg total) by mouth every 6 (six) hours as needed for nausea. 40 tablet 0  . oxyCODONE (OXY IR/ROXICODONE) 5 MG immediate release tablet Take 1-2 tablets (5-10 mg total) by mouth every 3 (three) hours as needed for moderate pain or severe pain. 80 tablet 0  . PARoxetine (PAXIL) 30 MG tablet Take 30 mg by mouth daily.    . polyethylene glycol (MIRALAX / GLYCOLAX) packet Take 17 g by mouth daily as needed for mild constipation. 14 each 0  . Polyethylene Glycol 400 (BLINK  TEARS OP) Place 1 drop into both eyes 4 (four) times daily as needed (Dry eyes).     . potassium chloride (K-DUR) 10 MEQ tablet Take 1 tablet (10 mEq total) by mouth daily. 14 tablet 0  . quinapril (ACCUPRIL) 40 MG tablet Take 40 mg by mouth at bedtime.    . Senna-Psyllium (PERDIEM PO) Take 1 tablet by mouth daily as needed (constipation).     . simvastatin (ZOCOR) 20 MG tablet Take 20 mg by mouth at bedtime.      . insulin regular human CONCENTRATED (HUMULIN R) 500 UNIT/ML SOLN injection Inject 60-150 Units into the skin 3 (three) times daily before meals. Sliding scale range is reported 60-150 units based on a sliding scale. She takes it 30 minutes before meals.     No current facility-administered medications on file prior to visit.      Review of Systems Constitutional:   No  weight loss, night sweats,  Fevers, chills, fatigue, or  lassitude.  HEENT:   No headaches,  Difficulty swallowing,  Tooth/dental problems, or  Sore throat,                No sneezing, itching, ear ache, nasal congestion, post nasal drip,   CV:  No chest pain,  Orthopnea, PND, swelling in lower extremities, anasarca, dizziness, palpitations, syncope.   GI  No heartburn, indigestion, abdominal pain, nausea, vomiting, diarrhea, change in bowel habits, loss of appetite, bloody stools.   Resp: No chest wall deformity  Skin: no rash or lesions.  GU: no dysuria, change in color of urine, no urgency or frequency.  No flank pain, no hematuria   MS:  No joint pain or swelling.  No decreased range of motion.  No back pain.  Psych:  No change in mood or affect. No depression or anxiety.  No memory loss.         Objective:   Physical Exam  GEN: A/Ox3; pleasant , NAD, elderly   HEENT:  Vass/AT,  EACs-clear, TMs-wnl, NOSE-clear, THROAT-clear, no lesions, no postnasal drip or exudate noted.   NECK:  Supple w/ fair ROM; no JVD; normal carotid impulses w/o bruits; no thyromegaly or nodules palpated; no  lymphadenopathy.  RESP  Decreased BS in bases .no accessory muscle use, no dullness to percussion  CARD:  RRR, no m/r/g  , no peripheral edema, pulses intact, no cyanosis or clubbing.  GI:   Soft & nt; nml bowel sounds; no organomegaly or masses detected.  Musco: Warm bil, no deformities or joint swelling noted.   Neuro: alert, no focal deficits noted.    Skin: Warm, no lesions or rashes        Assessment & Plan:

## 2015-05-26 NOTE — Progress Notes (Signed)
Chart and office note reviewed in detail along with available xrays/ labs > agree with a/p as outlined  

## 2015-05-31 ENCOUNTER — Ambulatory Visit: Payer: Medicare Other | Admitting: Physician Assistant

## 2015-06-04 ENCOUNTER — Ambulatory Visit (HOSPITAL_COMMUNITY)
Admission: RE | Admit: 2015-06-04 | Discharge: 2015-06-04 | Disposition: A | Discharge status: Home / Self Care | Payer: Medicare Other | Source: Ambulatory Visit | Attending: Radiology | Admitting: Radiology

## 2015-06-04 ENCOUNTER — Ambulatory Visit (HOSPITAL_COMMUNITY)
Admission: RE | Admit: 2015-06-04 | Discharge: 2015-06-04 | Disposition: A | Discharge status: Home / Self Care | Payer: Medicare Other | Source: Ambulatory Visit | Attending: Adult Health | Admitting: Adult Health

## 2015-06-04 DIAGNOSIS — J90 Pleural effusion, not elsewhere classified: Secondary | ICD-10-CM | POA: Diagnosis not present

## 2015-06-04 DIAGNOSIS — Z9889 Other specified postprocedural states: Secondary | ICD-10-CM

## 2015-06-04 LAB — BODY FLUID CELL COUNT WITH DIFFERENTIAL
Eos, Fluid: 0 %
Lymphs, Fluid: 12 %
Monocyte-Macrophage-Serous Fluid: 79 % (ref 50–90)
NEUTROPHIL FLUID: 9 % (ref 0–25)
Total Nucleated Cell Count, Fluid: 503 cu mm (ref 0–1000)

## 2015-06-04 LAB — GLUCOSE, SEROUS FLUID: Glucose, Fluid: 604 mg/dL

## 2015-06-04 LAB — AMYLASE, PLEURAL FLUID: AMYLASE, PLEURAL FLUID: 10 U/L

## 2015-06-04 LAB — LACTATE DEHYDROGENASE, PLEURAL OR PERITONEAL FLUID: LD, Fluid: 83 U/L — ABNORMAL HIGH (ref 3–23)

## 2015-06-04 LAB — PATHOLOGIST SMEAR REVIEW

## 2015-06-04 LAB — PROTEIN, BODY FLUID: Total protein, fluid: <3 g/dL

## 2015-06-04 LAB — GRAM STAIN

## 2015-06-04 NOTE — Procedures (Signed)
Ultrasound-guided diagnostic and therapeutic right thoracentesis performed yielding 760 cc yellow fluid. The fluid was submitted to the lab for preordered studies. Follow-up chest x-ray pending. No immediate complications.

## 2015-06-05 LAB — PH, BODY FLUID: pH, Body Fluid: 8.6

## 2015-06-05 LAB — TRIGLYCERIDES, BODY FLUIDS: Triglycerides, Fluid: 29 mg/dL

## 2015-06-09 LAB — CULTURE, BODY FLUID-BOTTLE

## 2015-06-09 LAB — CULTURE, BODY FLUID W GRAM STAIN -BOTTLE: Culture: NO GROWTH

## 2015-06-15 NOTE — Progress Notes (Signed)
Reviewed & agree with plan Transudate fluid-negative cytology noted

## 2015-06-16 ENCOUNTER — Telehealth: Payer: Self-pay | Admitting: Adult Health

## 2015-06-16 DIAGNOSIS — F329 Major depressive disorder, single episode, unspecified: Secondary | ICD-10-CM | POA: Diagnosis not present

## 2015-06-16 DIAGNOSIS — M6281 Muscle weakness (generalized): Secondary | ICD-10-CM | POA: Diagnosis not present

## 2015-06-16 DIAGNOSIS — E119 Type 2 diabetes mellitus without complications: Secondary | ICD-10-CM | POA: Diagnosis not present

## 2015-06-16 DIAGNOSIS — L89620 Pressure ulcer of left heel, unstageable: Secondary | ICD-10-CM | POA: Diagnosis not present

## 2015-06-16 DIAGNOSIS — I10 Essential (primary) hypertension: Secondary | ICD-10-CM | POA: Diagnosis not present

## 2015-06-16 DIAGNOSIS — M1990 Unspecified osteoarthritis, unspecified site: Secondary | ICD-10-CM | POA: Diagnosis not present

## 2015-06-16 NOTE — Progress Notes (Signed)
Quick Note:  Returned pt's call (see phone note 06/16/15). Unable to LVM ______

## 2015-06-16 NOTE — Telephone Encounter (Signed)
Spoke with the pt and notified of results per TP  She verbalized understanding  She reports that her breathing is progressively worse and does not feel she can wait until her next planned ov here on 06/21/14  OV with TP for tomorrow at 3:45 pm

## 2015-06-16 NOTE — Telephone Encounter (Signed)
Notes Recorded by Melvenia Needles, NP on 06/15/2015 at 5:22 PM Pleural fluid showed inflammation , reactive mesothelial cells Will discuss in detail on return of on 1/10 with cxr  Please contact office for sooner follow up if symptoms do not improve or worsen or seek emergency care   Attempted to call pt. Was unable to LVM.

## 2015-06-16 NOTE — Telephone Encounter (Signed)
Pt cb, [phone]

## 2015-06-17 ENCOUNTER — Telehealth: Payer: Self-pay | Admitting: Pulmonary Disease

## 2015-06-17 NOTE — Telephone Encounter (Signed)
Called spoke w/ pt. Did not see where anyone has contacted her. She was already aware of results as of 06/16/15. Nothing further needed

## 2015-06-17 NOTE — Telephone Encounter (Signed)
Spoke with pt. She had a few questions about her results. These have been addressed. Nothing further was needed.

## 2015-06-18 ENCOUNTER — Telehealth: Payer: Self-pay | Admitting: Adult Health

## 2015-06-18 ENCOUNTER — Ambulatory Visit: Payer: Medicare Other | Admitting: Cardiology

## 2015-06-18 ENCOUNTER — Ambulatory Visit: Payer: Medicare Other | Admitting: Adult Health

## 2015-06-18 NOTE — Telephone Encounter (Signed)
Called pt , discussed results.  Ov on 1/10 with cxr  Please contact office for sooner follow up if symptoms do not improve or worsen or seek emergency care

## 2015-06-18 NOTE — Telephone Encounter (Signed)
Called spoke with pt. She cancelled appt this afternoon with TP d/t weather. She is scheduled for 06/22/15 to see TP. She wants TP to call her bc she has a lot of questions about her recent results she received. She does not want to wait until next week to speak with her.   Expand All Collapse All   Notes Recorded by Melvenia Needles, NP on 06/15/2015 at 5:22 PM Pleural fluid showed inflammation , reactive mesothelial cells Will discuss in detail on return of on 1/10 with cxr  Please contact office for sooner follow up if symptoms do not improve or worsen or seek emergency care       ---  Please advise TP thanks

## 2015-06-22 ENCOUNTER — Ambulatory Visit: Payer: Self-pay | Admitting: Adult Health

## 2015-06-22 ENCOUNTER — Ambulatory Visit (INDEPENDENT_AMBULATORY_CARE_PROVIDER_SITE_OTHER): Payer: Medicare Other | Admitting: Adult Health

## 2015-06-22 ENCOUNTER — Telehealth: Payer: Self-pay | Admitting: Pulmonary Disease

## 2015-06-22 ENCOUNTER — Ambulatory Visit (INDEPENDENT_AMBULATORY_CARE_PROVIDER_SITE_OTHER)
Admission: RE | Admit: 2015-06-22 | Discharge: 2015-06-22 | Disposition: A | Discharge status: Home / Self Care | Payer: Medicare Other | Source: Ambulatory Visit | Attending: Adult Health | Admitting: Adult Health

## 2015-06-22 ENCOUNTER — Encounter: Payer: Self-pay | Admitting: Adult Health

## 2015-06-22 VITALS — BP 138/86 | HR 95 | Temp 98.3°F | Ht 68.0 in | Wt 213.0 lb

## 2015-06-22 DIAGNOSIS — J90 Pleural effusion, not elsewhere classified: Secondary | ICD-10-CM | POA: Diagnosis not present

## 2015-06-22 DIAGNOSIS — I509 Heart failure, unspecified: Secondary | ICD-10-CM

## 2015-06-22 NOTE — Progress Notes (Signed)
Subjective:    Patient ID: Jaclyn Herrera, female    DOB: 1945-12-28, 70 y.o.   MRN: [id number]  HPI  70 yo female seen for pulmonary consult 05/03/15 for bilateral pleural effusions .     06/22/2015 Follow up Pleural Effusions Patient returns for a 4-week follow-up for pleural effusions She was seen previously for a pulmonary consult for bilateral pleural effusions.. CT chest angiogram was negative for PE but showed bilateral effusions, right greater than left. She has known grade 2 diastolic dysfunction on echo 2014 . She was started on Lasix.20mg  daily  She was started on Lasix 20mg  with recent dose increase 20mg  Twice daily  .  underwent thoracentesis on 06/04/15 w/ 760 cc removed.  Cytology was neg for malignant cells showed mesothelial cells.  ?  transudative. Did not go for serum labs .  She says her swelling has been worse since starting norvasc 2 months ago.  Get winded easily with walking and in supine position.  Denies chest pain, fever, discolored mucus, hemoptyiss .      Past Medical History  Diagnosis Date  . Diabetes mellitus   . Hypertension   . Arthritis   . Shingles   . hypothyroidism   . Bronchitis   . Hyperlipidemia   . GERD (gastroesophageal reflux disease)   . PONV (postoperative nausea and vomiting)    Current Outpatient Prescriptions on File Prior to Visit  Medication Sig Dispense Refill  . acetaminophen (TYLENOL) 500 MG tablet Take 500-1,000 mg by mouth every 6 (six) hours as needed for headache.     . albuterol (PROVENTIL HFA;VENTOLIN HFA) 108 (90 BASE) MCG/ACT inhaler Inhale 2 puffs into the lungs every 4 (four) hours as needed for wheezing or shortness of breath.    Marland Kitchen amLODipine (NORVASC) 10 MG tablet Take 1 tablet (10 mg total) by mouth daily. 90 tablet 3  . bisacodyl (DULCOLAX) 10 MG suppository Place 1 suppository (10 mg total) rectally daily as needed for moderate constipation. 12 suppository 0  . carisoprodol (SOMA) 350 MG tablet Take  175-350 mg by mouth daily as needed for muscle spasms. For cramps    . esomeprazole (NEXIUM) 20 MG capsule Take 20 mg by mouth daily at 12 noon.    . fexofenadine (ALLEGRA) 180 MG tablet Take 180 mg by mouth daily as needed for allergies or rhinitis.    . fluticasone (FLONASE) 50 MCG/ACT nasal spray Place 2 sprays into the nose 2 (two) times daily as needed for allergies.     . furosemide (LASIX) 20 MG tablet Take 20 mg by mouth 2 (two) times daily as needed for fluid.     Marland Kitchen insulin regular human CONCENTRATED (HUMULIN R) 500 UNIT/ML SOLN injection Inject 60-150 Units into the skin 3 (three) times daily before meals. Sliding scale range is reported 60-150 units based on a sliding scale. She takes it 30 minutes before meals.    Marland Kitchen levothyroxine (SYNTHROID, LEVOTHROID) 125 MCG tablet Take 125-187.5 mcg by mouth daily. Takes 125 mcg daily. On Sundays she takes 187.5 mcg    . meclizine (ANTIVERT) 25 MG tablet Take 25 mg by mouth 2 (two) times daily.    . metoCLOPramide (REGLAN) 5 MG tablet Take 1 tablet (5 mg total) by mouth every 8 (eight) hours as needed for nausea (if ondansetron (ZOFRAN) ineffective.). 40 tablet 0  . ondansetron (ZOFRAN) 4 MG tablet Take 1 tablet (4 mg total) by mouth every 6 (six) hours as needed for nausea. 40 tablet 0  .  oxyCODONE (OXY IR/ROXICODONE) 5 MG immediate release tablet Take 1-2 tablets (5-10 mg total) by mouth every 3 (three) hours as needed for moderate pain or severe pain. 80 tablet 0  . PARoxetine (PAXIL) 30 MG tablet Take 30 mg by mouth daily. Reported on 06/22/2015    . Polyethylene Glycol 400 (BLINK TEARS OP) Place 1 drop into both eyes 4 (four) times daily as needed (Dry eyes).     . potassium chloride (K-DUR) 10 MEQ tablet Take 1 tablet (10 mEq total) by mouth daily. 14 tablet 0  . quinapril (ACCUPRIL) 40 MG tablet Take 40 mg by mouth at bedtime.    . simvastatin (ZOCOR) 20 MG tablet Take 20 mg by mouth at bedtime.      . docusate sodium (COLACE) 100 MG capsule  Take 1 capsule (100 mg total) by mouth 2 (two) times daily. (Patient not taking: Reported on 06/22/2015) 60 capsule 0  . methocarbamol (ROBAXIN) 500 MG tablet Take 1 tablet (500 mg total) by mouth every 6 (six) hours as needed for muscle spasms. (Patient not taking: Reported on 06/22/2015) 80 tablet 0  . Multiple Vitamins-Minerals (DECUBI-VITE PO) Take by mouth daily. Reported on 06/22/2015    . Senna-Psyllium (PERDIEM PO) Take 1 tablet by mouth daily as needed (constipation). Reported on 06/22/2015     No current facility-administered medications on file prior to visit.      Review of Systems  Constitutional:   No  weight loss, night sweats,  Fevers, chills, fatigue, or  lassitude.  HEENT:   No headaches,  Difficulty swallowing,  Tooth/dental problems, or  Sore throat,                No sneezing, itching, ear ache, nasal congestion, post nasal drip,   CV:  No chest pain,  Orthopnea, PND,   anasarca, dizziness, palpitations, syncope.   GI  No heartburn, indigestion, abdominal pain, nausea, vomiting, diarrhea, change in bowel habits, loss of appetite, bloody stools.   Resp: No chest wall deformity  Skin: no rash or lesions.  GU: no dysuria, change in color of urine, no urgency or frequency.  No flank pain, no hematuria   MS:  No joint pain or swelling.  No decreased range of motion.  No back pain.  Psych:  No change in mood or affect. No depression or anxiety.  No memory loss.         Objective:   Physical Exam   GEN: A/Ox3; pleasant , NAD, elderly   HEENT:  [place]/AT,  EACs-clear, TMs-wnl, NOSE-clear, THROAT-clear, no lesions, no postnasal drip or exudate noted.   NECK:  Supple w/ fair ROM; no JVD; normal carotid impulses w/o bruits; no thyromegaly or nodules palpated; no lymphadenopathy.  RESP  Decreased BS in bases .no accessory muscle use, no dullness to percussion  CARD:  RRR, no m/r/g  , 1+ peripheral edema, pulses intact, no cyanosis or clubbing.  GI:   Soft & nt; nml  bowel sounds; no organomegaly or masses detected.  Musco: Warm bil, no deformities or joint swelling noted.   Neuro: alert, no focal deficits noted.    Skin: Warm, no lesions or rashes   CXR 06/22/2015  Increased interstiital edema ? CHF -small bilateral effusion R>L  reveiwed indepnently      Assessment & Plan:

## 2015-06-22 NOTE — Telephone Encounter (Signed)
NA is not until end of April. Please advise RA if pt can be worked into your schedule? thanks

## 2015-06-22 NOTE — Patient Instructions (Addendum)
Stop Amlodipine .   Begin Bystolic 10mg  daily  Continue on Lasix 20mg  Twice daily  .  We are setting you up for 2 D echo.  Check blood pressure at home daily and keep log, call b/p >140 .  Follow up with PARRETT,TAMMY NP in 2 weeks with chest xray and labs.  follow up Dr. Elsworth Soho  In 6-8 weeks and As needed   Please contact office for sooner follow up if symptoms do not improve or worsen or seek emergency care

## 2015-06-22 NOTE — Assessment & Plan Note (Signed)
Bilateral Effusion R>L , appear to be small.  S/p thoracentesis 06/04/15 -right sided not as big as before  ? releated to worsening D CHF  Repeat Echo .  Cont on Lasix 40mg  daily  Stop norvasc ? Making edema worse Change to bystolic daily  follow up in 2 weeks   Plan  Stop Amlodipine .   Begin Bystolic 10mg  daily  Continue on Lasix 20mg  Twice daily  .  We are setting you up for 2 D echo.  Check blood pressure at home daily and keep log, call b/p >140 .  Follow up with PARRETT,TAMMY NP in 2 weeks with chest xray and labs.  follow up Dr. Elsworth Soho  In 6-8 weeks and As needed   Please contact office for sooner follow up if symptoms do not improve or worsen or seek emergency care

## 2015-06-23 DIAGNOSIS — L97421 Non-pressure chronic ulcer of left heel and midfoot limited to breakdown of skin: Secondary | ICD-10-CM | POA: Diagnosis not present

## 2015-06-23 DIAGNOSIS — Z4789 Encounter for other orthopedic aftercare: Secondary | ICD-10-CM | POA: Diagnosis not present

## 2015-06-24 DIAGNOSIS — E119 Type 2 diabetes mellitus without complications: Secondary | ICD-10-CM | POA: Diagnosis not present

## 2015-06-24 DIAGNOSIS — F329 Major depressive disorder, single episode, unspecified: Secondary | ICD-10-CM | POA: Diagnosis not present

## 2015-06-24 DIAGNOSIS — M6281 Muscle weakness (generalized): Secondary | ICD-10-CM | POA: Diagnosis not present

## 2015-06-24 DIAGNOSIS — L89620 Pressure ulcer of left heel, unstageable: Secondary | ICD-10-CM | POA: Diagnosis not present

## 2015-06-24 DIAGNOSIS — M1990 Unspecified osteoarthritis, unspecified site: Secondary | ICD-10-CM | POA: Diagnosis not present

## 2015-06-24 DIAGNOSIS — I10 Essential (primary) hypertension: Secondary | ICD-10-CM | POA: Diagnosis not present

## 2015-06-25 NOTE — Telephone Encounter (Signed)
lmtcb x1 for pt. 

## 2015-06-25 NOTE — Telephone Encounter (Signed)
January 31 at 3:45 PM

## 2015-06-25 NOTE — Telephone Encounter (Signed)
RA please advise where we can place the pt on your schedule. Thanks.

## 2015-06-28 NOTE — Telephone Encounter (Signed)
Pt calling back to schedule appt with Dr Elsworth Soho. Appt schedule for Jul 13, 2015 at 345p with RA Appt for TP 07/06/15 cancelled per pt request. Nothing further needed.

## 2015-06-28 NOTE — Telephone Encounter (Addendum)
LM for pt x 2 Please schedule patient when returns call for January 31 at 3:45 PM

## 2015-06-28 NOTE — Progress Notes (Signed)
Reviewed & agree with plan  

## 2015-06-29 ENCOUNTER — Telehealth: Payer: Self-pay

## 2015-06-29 NOTE — Telephone Encounter (Signed)
Left message on patient voicemail  for a return call to verify insurance

## 2015-07-01 ENCOUNTER — Ambulatory Visit (HOSPITAL_COMMUNITY): Payer: Medicare Other | Attending: Cardiovascular Disease

## 2015-07-01 ENCOUNTER — Other Ambulatory Visit: Payer: Self-pay

## 2015-07-01 DIAGNOSIS — I517 Cardiomegaly: Secondary | ICD-10-CM | POA: Diagnosis not present

## 2015-07-01 DIAGNOSIS — I340 Nonrheumatic mitral (valve) insufficiency: Secondary | ICD-10-CM | POA: Insufficient documentation

## 2015-07-01 DIAGNOSIS — I509 Heart failure, unspecified: Secondary | ICD-10-CM | POA: Diagnosis not present

## 2015-07-06 ENCOUNTER — Ambulatory Visit (INDEPENDENT_AMBULATORY_CARE_PROVIDER_SITE_OTHER)
Admission: RE | Admit: 2015-07-06 | Discharge: 2015-07-06 | Disposition: A | Discharge status: Home / Self Care | Payer: Medicare Other | Source: Ambulatory Visit | Attending: Adult Health | Admitting: Adult Health

## 2015-07-06 ENCOUNTER — Encounter: Payer: Self-pay | Admitting: Adult Health

## 2015-07-06 ENCOUNTER — Telehealth: Payer: Self-pay | Admitting: Adult Health

## 2015-07-06 ENCOUNTER — Ambulatory Visit (INDEPENDENT_AMBULATORY_CARE_PROVIDER_SITE_OTHER): Payer: Medicare Other | Admitting: Adult Health

## 2015-07-06 ENCOUNTER — Other Ambulatory Visit (INDEPENDENT_AMBULATORY_CARE_PROVIDER_SITE_OTHER): Payer: Medicare Other

## 2015-07-06 ENCOUNTER — Ambulatory Visit: Payer: Self-pay | Admitting: Adult Health

## 2015-07-06 ENCOUNTER — Telehealth: Payer: Self-pay | Admitting: Pulmonary Disease

## 2015-07-06 VITALS — BP 128/68 | HR 54 | Temp 98.0°F | Ht 68.0 in | Wt 220.0 lb

## 2015-07-06 DIAGNOSIS — J90 Pleural effusion, not elsewhere classified: Secondary | ICD-10-CM

## 2015-07-06 DIAGNOSIS — R0600 Dyspnea, unspecified: Secondary | ICD-10-CM

## 2015-07-06 DIAGNOSIS — I10 Essential (primary) hypertension: Secondary | ICD-10-CM | POA: Diagnosis not present

## 2015-07-06 DIAGNOSIS — R0602 Shortness of breath: Secondary | ICD-10-CM | POA: Diagnosis not present

## 2015-07-06 LAB — BRAIN NATRIURETIC PEPTIDE: PRO B NATRI PEPTIDE: 638 pg/mL — AB (ref 0.0–100.0)

## 2015-07-06 LAB — BASIC METABOLIC PANEL
BUN: 18 mg/dL (ref 6–23)
CALCIUM: 8.8 mg/dL (ref 8.4–10.5)
CHLORIDE: 103 meq/L (ref 96–112)
CO2: 31 mEq/L (ref 19–32)
CREATININE: 0.87 mg/dL (ref 0.40–1.20)
GFR: 68.28 mL/min (ref >60.00)
Glucose, Bld: 48 mg/dL — CL (ref 70–99)
Potassium: 3.8 mEq/L (ref 3.5–5.1)
Sodium: 142 mEq/L (ref 135–145)

## 2015-07-06 LAB — CBC
HEMATOCRIT: 33.7 % — AB (ref 36.0–46.0)
HEMOGLOBIN: 11.1 g/dL — AB (ref 12.0–15.0)
MCHC: 32.9 g/dL (ref 30.0–36.0)
MCV: 80.2 fl (ref 78.0–100.0)
PLATELETS: 288 10*3/uL (ref 150.0–400.0)
RBC: 4.2 Mil/uL (ref 3.87–5.11)
RDW: 18 % — AB (ref 11.5–15.5)
WBC: 9.3 10*3/uL (ref 4.0–10.5)

## 2015-07-06 LAB — HEPATIC FUNCTION PANEL
ALBUMIN: 3.9 g/dL (ref 3.5–5.2)
ALT: 26 U/L (ref 0–35)
AST: 25 U/L (ref 0–37)
Alkaline Phosphatase: 204 U/L — ABNORMAL HIGH (ref 39–117)
Bilirubin, Direct: 0.2 mg/dL (ref 0.0–0.3)
Total Bilirubin: 0.6 mg/dL (ref 0.2–1.2)
Total Protein: 6.6 g/dL (ref 6.0–8.3)

## 2015-07-06 MED ORDER — SPIRONOLACTONE 25 MG PO TABS: 25.0000 mg | ORAL_TABLET | Freq: Every day | ORAL | Status: DC

## 2015-07-06 NOTE — Assessment & Plan Note (Signed)
B/p controlled on current regimen  Norvasc was stopped due to edema last ov .  Cont on Bystolic  follow up with PCP for b/p  Check labs today .

## 2015-07-06 NOTE — Telephone Encounter (Signed)
Spoke with pt. Pt here for appt with TP that was previously scheduled then cancelled per previous phone note. Pt c/o increase in SOB and swelling. Per TP's last OV note pt is needing 2 week f/u with CXR and labs. Pt sent down for CXR and worked on to [employer] schedule today to be seen. Pt aware. Apologized to pt for miscommunication, inconvenience, and cancelled appt. Estill Bamberg aware of schedule change. Nothing further needed at this time.   Patient Instructions     Stop Amlodipine .  Begin Bystolic 10mg  daily  Continue on Lasix 20mg  Twice daily .  We are setting you up for 2 D echo.  Check blood pressure at home daily and keep log, call b/p >140 .  Follow up with PARRETT,TAMMY NP in 2 weeks with chest xray and labs.  follow up Dr. Elsworth Soho In 6-8 weeks and As needed  Please contact office for sooner follow up if symptoms do not improve or worsen or seek emergency care

## 2015-07-06 NOTE — Telephone Encounter (Signed)
Pt returned call. I reviewed results and TP's recs. Pt stated that when she left our office she met a friend and ate a chicken salad and a small amount of tuna salad. She states she states was driving home from her day date with her friend and that she feels fine. I explained to her that she needed to make sure she monitored her blood sugar daily. She voiced understanding and had no further questions.   Spoke with TP and informed her of the above. Nothing further needed.

## 2015-07-06 NOTE — Assessment & Plan Note (Signed)
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 2 D echo showed mild LVH with mod MR with elevated diastolic pressures and LA dilation . PAP were normal .  Will try to continue to diuresis as b/p and scr will allow.  Add aldactone low dose . (pt is stop K+, will have close follow up with labs as she is on ACE )  Cont lasix  Case discussed with Dr. Melvyn Novas  .   Plan  Begin Aldactone 25mg  daily .  Continue on Lasix 20mg  Twice daily  .  Do not take potassium .  Labs today .  Low salt diet.  Legs elevated.  Follow up Dr. Elsworth Soho  In 1 week and As needed  -will need repeat labs.     Please contact office for sooner follow up if symptoms do not improve or worsen or seek emergency care

## 2015-07-06 NOTE — Telephone Encounter (Signed)
Received critical call report from [facility] in the lab for the patient Glucose level was 48 at 12:17pm.   Informed TP of results Per TP Call patient to review results and make sure she is aware that she needs to eat   LVM for patient to return call.

## 2015-07-06 NOTE — Progress Notes (Signed)
Chart and office note reviewed in detail along with available xrays/ labs > agree with a/p as outlined  

## 2015-07-06 NOTE — Patient Instructions (Signed)
Begin Aldactone 25mg  daily .  Continue on Lasix 20mg  Twice daily  .  Do not take potassium .  Labs today .  Low salt diet.  Legs elevated.  Follow up Dr. Elsworth Soho  In 1 week and As needed  -will need repeat labs.     Please contact office for sooner follow up if symptoms do not improve or worsen or seek emergency care

## 2015-07-06 NOTE — Progress Notes (Signed)
Subjective:    Patient ID: Jaclyn Herrera, female    DOB: 11-02-1945, 70 y.o.   MRN: [id number]  HPI 70 yo female seen for pulmonary consult 05/03/15 for bilateral pleural effusions .     07/06/2015 Follow up Pleural Effusions Patient returns for a 2-week follow-up for pleural effusions She was seen previously for a pulmonary consult for bilateral pleural effusions.. CT chest angiogram was negative for PE but showed bilateral effusions, right greater than left. She has known grade 2 diastolic dysfunction on echo 2014 . She was started on Lasix.20mg  daily  She was started on Lasix 20mg  with recent dose increase 20mg  Twice daily  .  underwent thoracentesis on 06/04/15 w/ 760 cc removed.  Cytology was neg for malignant cells showed mesothelial cells.  Appeared to be  transudative.  She says her swelling has been worse since starting norvasc 2 months ago.  Last ov , Norvasc was stopped. Bisoprolol added. B/p is well controlled today  2 D echo repeated on 07/01/15  with EF at 60-65%, mild LVH, Mild to Mod MV regurg, LA mod dilated. , PAP nml , elevated end diastolic pressures.  Does not feel any better. Get winded easily with walking and in supine position.  CXR shows no sign change in small bilateral pleural effusions R>L , increased interstitial marking bilateraly , ? BB atx.  Denies chest pain,   hemoptyiss , nv/d.Marland Kitchen   Had foot fx s/p surgery 02/2015 . Has a poor healing wound that is still draining . Following with ortho [facility].       Past Medical History  Diagnosis Date  . Diabetes mellitus   . Hypertension   . Arthritis   . Shingles   . hypothyroidism   . Bronchitis   . Hyperlipidemia   . GERD (gastroesophageal reflux disease)   . PONV (postoperative nausea and vomiting)    Current Outpatient Prescriptions on File Prior to Visit  Medication Sig Dispense Refill  . acetaminophen (TYLENOL) 500 MG tablet Take 500-1,000 mg by mouth every 6 (six) hours as needed for headache.      . albuterol (PROVENTIL HFA;VENTOLIN HFA) 108 (90 BASE) MCG/ACT inhaler Inhale 2 puffs into the lungs every 4 (four) hours as needed for wheezing or shortness of breath.    . bisacodyl (DULCOLAX) 10 MG suppository Place 1 suppository (10 mg total) rectally daily as needed for moderate constipation. 12 suppository 0  . carisoprodol (SOMA) 350 MG tablet Take 175-350 mg by mouth daily as needed for muscle spasms. For cramps    . esomeprazole (NEXIUM) 20 MG capsule Take 20 mg by mouth daily at 12 noon.    . fexofenadine (ALLEGRA) 180 MG tablet Take 180 mg by mouth daily as needed for allergies or rhinitis.    . fluticasone (FLONASE) 50 MCG/ACT nasal spray Place 2 sprays into the nose 2 (two) times daily as needed for allergies.     . furosemide (LASIX) 20 MG tablet Take 20 mg by mouth 2 (two) times daily as needed for fluid.     Marland Kitchen insulin regular human CONCENTRATED (HUMULIN R) 500 UNIT/ML SOLN injection Inject 60-150 Units into the skin 3 (three) times daily before meals. Sliding scale range is reported 60-150 units based on a sliding scale. She takes it 30 minutes before meals.    Marland Kitchen levothyroxine (SYNTHROID, LEVOTHROID) 125 MCG tablet Take 125-187.5 mcg by mouth daily. Takes 125 mcg daily. On Sundays she takes 187.5 mcg    . meclizine (ANTIVERT) 25  MG tablet Take 25 mg by mouth 2 (two) times daily.    . metoCLOPramide (REGLAN) 5 MG tablet Take 1 tablet (5 mg total) by mouth every 8 (eight) hours as needed for nausea (if ondansetron (ZOFRAN) ineffective.). 40 tablet 0  . Multiple Vitamins-Minerals (DECUBI-VITE PO) Take by mouth daily. Reported on 06/22/2015    . ondansetron (ZOFRAN) 4 MG tablet Take 1 tablet (4 mg total) by mouth every 6 (six) hours as needed for nausea. 40 tablet 0  . PARoxetine (PAXIL) 30 MG tablet Take 30 mg by mouth daily. Reported on 06/22/2015    . Polyethylene Glycol 400 (BLINK TEARS OP) Place 1 drop into both eyes 4 (four) times daily as needed (Dry eyes).     . quinapril  (ACCUPRIL) 40 MG tablet Take 40 mg by mouth at bedtime.    . Senna-Psyllium (PERDIEM PO) Take 1 tablet by mouth daily as needed (constipation). Reported on 06/22/2015    . simvastatin (ZOCOR) 20 MG tablet Take 20 mg by mouth at bedtime.      . docusate sodium (COLACE) 100 MG capsule Take 1 capsule (100 mg total) by mouth 2 (two) times daily. (Patient not taking: Reported on 07/06/2015) 60 capsule 0  . methocarbamol (ROBAXIN) 500 MG tablet Take 1 tablet (500 mg total) by mouth every 6 (six) hours as needed for muscle spasms. (Patient not taking: Reported on 07/06/2015) 80 tablet 0  . oxyCODONE (OXY IR/ROXICODONE) 5 MG immediate release tablet Take 1-2 tablets (5-10 mg total) by mouth every 3 (three) hours as needed for moderate pain or severe pain. (Patient not taking: Reported on 07/06/2015) 80 tablet 0  . potassium chloride (K-DUR) 10 MEQ tablet Take 1 tablet (10 mEq total) by mouth daily. (Patient not taking: Reported on 07/06/2015) 14 tablet 0   No current facility-administered medications on file prior to visit.      Review of Systems Constitutional:   No  weight loss, night sweats,  Fevers, chills, fatigue, or  lassitude.  HEENT:   No headaches,  Difficulty swallowing,  Tooth/dental problems, or  Sore throat,                No sneezing, itching, ear ache, nasal congestion, post nasal drip,   CV:  No chest pain,  Orthopnea, PND,   anasarca, dizziness, palpitations, syncope.   GI  No heartburn, indigestion, abdominal pain, nausea, vomiting, diarrhea, change in bowel habits, loss of appetite, bloody stools.   Resp: No chest wall deformity  Skin: no rash or lesions.  GU: no dysuria, change in color of urine, no urgency or frequency.  No flank pain, no hematuria   MS:  No joint pain or swelling.  No decreased range of motion.  No back pain.  Psych:  No change in mood or affect. No depression or anxiety.  No memory loss.         Objective:   Physical Exam  Filed Vitals:   07/06/15  1100  BP: 128/68  Pulse: 54  Temp: 98 F (36.7 C)  TempSrc: Oral  Height: 5\' 8"  (1.727 m)  Weight: 220 lb (99.791 kg)  SpO2: 99%    GEN: A/Ox3; pleasant , NAD, elderly   HEENT:  [place]/AT,  EACs-clear, TMs-wnl, NOSE-clear, THROAT-clear, no lesions, no postnasal drip or exudate noted.   NECK:  Supple w/ fair ROM; no JVD; normal carotid impulses w/o bruits; no thyromegaly or nodules palpated; no lymphadenopathy.  RESP  Decreased BS in bases .no accessory muscle use,  no dullness to percussion  CARD:  RRR, no m/r/g  , 1+ peripheral edema, pulses intact, no cyanosis or clubbing.  GI:   Soft & nt; nml bowel sounds; no organomegaly or masses detected.  Musco: Warm bil, no deformities or joint swelling noted.   Neuro: alert, no focal deficits noted.    Skin: Warm, no lesions or rashes   CXR 07/06/2015  Mild interstiital edema ? CHF -small bilateral effusion R>L  reveiwed indepnently  No sign change      Assessment & Plan:

## 2015-07-07 DIAGNOSIS — M6281 Muscle weakness (generalized): Secondary | ICD-10-CM | POA: Diagnosis not present

## 2015-07-07 DIAGNOSIS — M1990 Unspecified osteoarthritis, unspecified site: Secondary | ICD-10-CM | POA: Diagnosis not present

## 2015-07-07 DIAGNOSIS — E119 Type 2 diabetes mellitus without complications: Secondary | ICD-10-CM | POA: Diagnosis not present

## 2015-07-07 DIAGNOSIS — F329 Major depressive disorder, single episode, unspecified: Secondary | ICD-10-CM | POA: Diagnosis not present

## 2015-07-07 DIAGNOSIS — L89620 Pressure ulcer of left heel, unstageable: Secondary | ICD-10-CM | POA: Diagnosis not present

## 2015-07-07 DIAGNOSIS — I10 Essential (primary) hypertension: Secondary | ICD-10-CM | POA: Diagnosis not present

## 2015-07-08 DIAGNOSIS — L97421 Non-pressure chronic ulcer of left heel and midfoot limited to breakdown of skin: Secondary | ICD-10-CM | POA: Diagnosis not present

## 2015-07-08 DIAGNOSIS — S92035D Nondisplaced avulsion fracture of tuberosity of left calcaneus, subsequent encounter for fracture with routine healing: Secondary | ICD-10-CM | POA: Diagnosis not present

## 2015-07-13 ENCOUNTER — Encounter: Payer: Self-pay | Admitting: Pulmonary Disease

## 2015-07-13 ENCOUNTER — Ambulatory Visit (INDEPENDENT_AMBULATORY_CARE_PROVIDER_SITE_OTHER): Payer: Medicare Other | Admitting: Pulmonary Disease

## 2015-07-13 ENCOUNTER — Ambulatory Visit (INDEPENDENT_AMBULATORY_CARE_PROVIDER_SITE_OTHER)
Admission: RE | Admit: 2015-07-13 | Discharge: 2015-07-13 | Disposition: A | Discharge status: Home / Self Care | Payer: Medicare Other | Source: Ambulatory Visit | Attending: Pulmonary Disease | Admitting: Pulmonary Disease

## 2015-07-13 VITALS — BP 136/70 | HR 58 | Ht 68.0 in | Wt 215.2 lb

## 2015-07-13 DIAGNOSIS — J90 Pleural effusion, not elsewhere classified: Secondary | ICD-10-CM | POA: Diagnosis not present

## 2015-07-13 DIAGNOSIS — R0602 Shortness of breath: Secondary | ICD-10-CM | POA: Diagnosis not present

## 2015-07-13 DIAGNOSIS — I5032 Chronic diastolic (congestive) heart failure: Secondary | ICD-10-CM

## 2015-07-13 HISTORY — DX: Chronic diastolic (congestive) heart failure: I50.32

## 2015-07-13 NOTE — Patient Instructions (Signed)
CXR today Take lasix 40 mg twice daily (9 am & 3 pm)  X 3 days , then back to once daily I will d/w dr Julianne Handler about other heart medications

## 2015-07-13 NOTE — Assessment & Plan Note (Addendum)
Unchanged in size Take lasix 40 mg twice daily (9 am & 3 pm)  X 3 days , then back to once daily

## 2015-07-13 NOTE — Progress Notes (Signed)
   Subjective:    Patient ID: Jaclyn Herrera, female    DOB: May 25, 1946, 70 y.o.   MRN: [id number]  HPI Jaclyn Herrera  70 yo diabetic seen for pulmonary consult 04/2015 for bilateral pleural effusions .  Had foot fx s/p surgery 02/2015 . Has a poor healing wound that is still draining . Following with ortho [facility].     07/13/2015  Chief Complaint  Patient presents with  . Acute Visit    having a lot of SOB, swelling in legs, woke up this morning with slurred speech and trouble writing, patient is very scared of dying, patient in tears, patient thinks she had a TIA this morning.    Accompanied by caregiver Vaughan Basta She was started on Lasix 20mg  with recent dose increase 20mg  Twice daily . Not diuresing with this  Her leg swelling was worse since starting norvasc 2 months ago.  In prior office visit Norvasc was stopped. Bisoprolol added. B/p is well controlled today    She remains dyspneic, Get winded easily with walking and in supine position. He reports orthopnea and sleeps on a wedge She felt the best after thoracenteses  CXR shows no sign change in small bilateral pleural effusions R>L , increased interstitial marking bilateraly ,  BNP 650  I have reviewed all relevant imaging, labs & test data & reconciled meds    Significant tests/ events  04/2015 CT angiogram was negative for PE but showed bilateral effusions, right greater than left.   underwent thoracentesis on 06/04/15 w/ 760 cc removed.  Cytology was neg for malignant cells showed mesothelial cells. Appeared to be  transudative.  2 D echo repeated on 07/01/15  with EF at 60-65%, mild LVH, Mild to Mod MV regurg, LA mod dilated. , PAP nml , elevated end diastolic pressures.  Review of Systems neg for any significant sore throat, dysphagia, itching, sneezing, nasal congestion or excess/ purulent secretions, fever, chills, sweats, unintended wt loss, pleuritic or exertional cp, hempoptysis Also denies presyncope, palpitations,  heartburn, abdominal pain, nausea, vomiting, diarrhea or change in bowel or urinary habits, dysuria,hematuria, rash, arthralgias, visual complaints, headache, numbness weakness or ataxia.     Objective:   Physical Exam  Gen. Pleasant, well-nourished, in no distress ENT - no lesions, no post nasal drip Neck: No JVD, no thyromegaly, no carotid bruits Lungs: no use of accessory muscles, no dullness to percussion, decreased breath sounds on right without rales or rhonchi  Cardiovascular: Rhythm regular, heart sounds  normal, no murmurs or gallops, 2+ peripheral edema Musculoskeletal: No deformities, no cyanosis or clubbing        Assessment & Plan:

## 2015-07-13 NOTE — Assessment & Plan Note (Signed)
Continue bystolic and  ACE inhibitor Discussed with cardiology if any other treatment plan

## 2015-07-14 ENCOUNTER — Telehealth: Payer: Self-pay | Admitting: *Deleted

## 2015-07-14 NOTE — Telephone Encounter (Signed)
error 

## 2015-07-15 DIAGNOSIS — M6281 Muscle weakness (generalized): Secondary | ICD-10-CM | POA: Diagnosis not present

## 2015-07-15 DIAGNOSIS — I10 Essential (primary) hypertension: Secondary | ICD-10-CM | POA: Diagnosis not present

## 2015-07-15 DIAGNOSIS — L89620 Pressure ulcer of left heel, unstageable: Secondary | ICD-10-CM | POA: Diagnosis not present

## 2015-07-15 DIAGNOSIS — F329 Major depressive disorder, single episode, unspecified: Secondary | ICD-10-CM | POA: Diagnosis not present

## 2015-07-15 DIAGNOSIS — M1990 Unspecified osteoarthritis, unspecified site: Secondary | ICD-10-CM | POA: Diagnosis not present

## 2015-07-15 DIAGNOSIS — E119 Type 2 diabetes mellitus without complications: Secondary | ICD-10-CM | POA: Diagnosis not present

## 2015-07-17 ENCOUNTER — Emergency Department (HOSPITAL_COMMUNITY): Payer: Medicare Other

## 2015-07-17 ENCOUNTER — Encounter (HOSPITAL_COMMUNITY): Payer: Self-pay | Admitting: Emergency Medicine

## 2015-07-17 ENCOUNTER — Emergency Department (HOSPITAL_COMMUNITY)
Admission: EM | Admit: 2015-07-17 | Discharge: 2015-07-17 | Disposition: A | Discharge status: Home / Self Care | Payer: Medicare Other | Attending: Emergency Medicine | Admitting: Emergency Medicine

## 2015-07-17 DIAGNOSIS — M1990 Unspecified osteoarthritis, unspecified site: Secondary | ICD-10-CM | POA: Diagnosis not present

## 2015-07-17 DIAGNOSIS — R404 Transient alteration of awareness: Secondary | ICD-10-CM | POA: Diagnosis not present

## 2015-07-17 DIAGNOSIS — S5012XA Contusion of left forearm, initial encounter: Secondary | ICD-10-CM | POA: Insufficient documentation

## 2015-07-17 DIAGNOSIS — Y998 Other external cause status: Secondary | ICD-10-CM | POA: Diagnosis not present

## 2015-07-17 DIAGNOSIS — I4581 Long QT syndrome: Secondary | ICD-10-CM | POA: Diagnosis not present

## 2015-07-17 DIAGNOSIS — E119 Type 2 diabetes mellitus without complications: Secondary | ICD-10-CM | POA: Diagnosis not present

## 2015-07-17 DIAGNOSIS — Y9289 Other specified places as the place of occurrence of the external cause: Secondary | ICD-10-CM | POA: Insufficient documentation

## 2015-07-17 DIAGNOSIS — Z79899 Other long term (current) drug therapy: Secondary | ICD-10-CM | POA: Insufficient documentation

## 2015-07-17 DIAGNOSIS — Z8619 Personal history of other infectious and parasitic diseases: Secondary | ICD-10-CM | POA: Insufficient documentation

## 2015-07-17 DIAGNOSIS — S299XXA Unspecified injury of thorax, initial encounter: Secondary | ICD-10-CM | POA: Diagnosis not present

## 2015-07-17 DIAGNOSIS — E785 Hyperlipidemia, unspecified: Secondary | ICD-10-CM | POA: Insufficient documentation

## 2015-07-17 DIAGNOSIS — Z794 Long term (current) use of insulin: Secondary | ICD-10-CM | POA: Diagnosis not present

## 2015-07-17 DIAGNOSIS — S0990XA Unspecified injury of head, initial encounter: Secondary | ICD-10-CM

## 2015-07-17 DIAGNOSIS — Z7951 Long term (current) use of inhaled steroids: Secondary | ICD-10-CM | POA: Diagnosis not present

## 2015-07-17 DIAGNOSIS — S199XXA Unspecified injury of neck, initial encounter: Secondary | ICD-10-CM | POA: Diagnosis not present

## 2015-07-17 DIAGNOSIS — I10 Essential (primary) hypertension: Secondary | ICD-10-CM | POA: Insufficient documentation

## 2015-07-17 DIAGNOSIS — E039 Hypothyroidism, unspecified: Secondary | ICD-10-CM | POA: Diagnosis not present

## 2015-07-17 DIAGNOSIS — Z8673 Personal history of transient ischemic attack (TIA), and cerebral infarction without residual deficits: Secondary | ICD-10-CM | POA: Diagnosis not present

## 2015-07-17 DIAGNOSIS — S0993XA Unspecified injury of face, initial encounter: Secondary | ICD-10-CM | POA: Diagnosis not present

## 2015-07-17 DIAGNOSIS — R9431 Abnormal electrocardiogram [ECG] [EKG]: Secondary | ICD-10-CM | POA: Diagnosis not present

## 2015-07-17 DIAGNOSIS — Y9389 Activity, other specified: Secondary | ICD-10-CM | POA: Insufficient documentation

## 2015-07-17 DIAGNOSIS — W19XXXA Unspecified fall, initial encounter: Secondary | ICD-10-CM

## 2015-07-17 DIAGNOSIS — Z8709 Personal history of other diseases of the respiratory system: Secondary | ICD-10-CM | POA: Insufficient documentation

## 2015-07-17 DIAGNOSIS — R51 Headache: Secondary | ICD-10-CM | POA: Diagnosis not present

## 2015-07-17 DIAGNOSIS — W06XXXA Fall from bed, initial encounter: Secondary | ICD-10-CM | POA: Insufficient documentation

## 2015-07-17 DIAGNOSIS — K219 Gastro-esophageal reflux disease without esophagitis: Secondary | ICD-10-CM | POA: Diagnosis not present

## 2015-07-17 DIAGNOSIS — S0083XA Contusion of other part of head, initial encounter: Secondary | ICD-10-CM | POA: Diagnosis not present

## 2015-07-17 DIAGNOSIS — J811 Chronic pulmonary edema: Secondary | ICD-10-CM | POA: Diagnosis not present

## 2015-07-17 DIAGNOSIS — R531 Weakness: Secondary | ICD-10-CM | POA: Diagnosis not present

## 2015-07-17 LAB — BASIC METABOLIC PANEL
Anion gap: 10 (ref 5–15)
BUN: 26 mg/dL — AB (ref 6–20)
CO2: 26 mmol/L (ref 22–32)
CREATININE: 0.84 mg/dL (ref 0.44–1.00)
Calcium: 8.5 mg/dL — ABNORMAL LOW (ref 8.9–10.3)
Chloride: 104 mmol/L (ref 101–111)
GFR calc Af Amer: >60 mL/min (ref >60)
Glucose, Bld: 77 mg/dL (ref 65–99)
Potassium: 3.8 mmol/L (ref 3.5–5.1)
SODIUM: 140 mmol/L (ref 135–145)

## 2015-07-17 LAB — CBC WITH DIFFERENTIAL/PLATELET
BASOS ABS: 0 10*3/uL (ref 0.0–0.1)
Basophils Relative: 0 %
EOS ABS: 0 10*3/uL (ref 0.0–0.7)
EOS PCT: 0 %
HCT: 34.3 % — ABNORMAL LOW (ref 36.0–46.0)
Hemoglobin: 11.5 g/dL — ABNORMAL LOW (ref 12.0–15.0)
Lymphocytes Relative: 7 %
Lymphs Abs: 0.5 10*3/uL — ABNORMAL LOW (ref 0.7–4.0)
MCH: 26.9 pg (ref 26.0–34.0)
MCHC: 33.5 g/dL (ref 30.0–36.0)
MCV: 80.3 fL (ref 78.0–100.0)
MONO ABS: 0.3 10*3/uL (ref 0.1–1.0)
Monocytes Relative: 3 %
Neutro Abs: 6.8 10*3/uL (ref 1.7–7.7)
Neutrophils Relative %: 90 %
PLATELETS: 184 10*3/uL (ref 150–400)
RBC: 4.27 MIL/uL (ref 3.87–5.11)
RDW: 15.8 % — AB (ref 11.5–15.5)
WBC: 7.6 10*3/uL (ref 4.0–10.5)

## 2015-07-17 LAB — CBG MONITORING, ED: GLUCOSE-CAPILLARY: 90 mg/dL (ref 65–99)

## 2015-07-17 NOTE — Discharge Instructions (Signed)
Plenty of rest, and eat 3 meals each day. Try to take your daily medicines at the same time each day. Use ice on the sore spots today and tomorrow. Take Tylenol as needed for pain. Follow-up with your pulmonary doctor, and cardiologist, as planned. Ask them to recheck your EKG to evaluate your QT duration.    Contusion A contusion is a deep bruise. Contusions are the result of a blunt injury to tissues and muscle fibers under the skin. The injury causes bleeding under the skin. The skin overlying the contusion may turn blue, purple, or yellow. Minor injuries will give you a painless contusion, but more severe contusions may stay painful and swollen for a few weeks.  CAUSES  This condition is usually caused by a blow, trauma, or direct force to an area of the body. SYMPTOMS  Symptoms of this condition include:  Swelling of the injured area.  Pain and tenderness in the injured area.  Discoloration. The area may have redness and then turn blue, purple, or yellow. DIAGNOSIS  This condition is diagnosed based on a physical exam and medical history. An X-ray, CT scan, or MRI may be needed to determine if there are any associated injuries, such as broken bones (fractures). TREATMENT  Specific treatment for this condition depends on what area of the body was injured. In general, the best treatment for a contusion is resting, icing, applying pressure to (compression), and elevating the injured area. This is often called the RICE strategy. Over-the-counter anti-inflammatory medicines may also be recommended for pain control.  HOME CARE INSTRUCTIONS   Rest the injured area.  If directed, apply ice to the injured area:  Put ice in a plastic bag.  Place a towel between your skin and the bag.  Leave the ice on for 20 minutes, 2-3 times per day.  If directed, apply light compression to the injured area using an elastic bandage. Make sure the bandage is not wrapped too tightly. Remove and  reapply the bandage as directed by your health care provider.  If possible, raise (elevate) the injured area above the level of your heart while you are sitting or lying down.  Take over-the-counter and prescription medicines only as told by your health care provider. SEEK MEDICAL CARE IF:  Your symptoms do not improve after several days of treatment.  Your symptoms get worse.  You have difficulty moving the injured area. SEEK IMMEDIATE MEDICAL CARE IF:   You have severe pain.  You have numbness in a hand or foot.  Your hand or foot turns pale or cold.   This information is not intended to replace advice given to you by your health care provider. Make sure you discuss any questions you have with your health care provider.   Document Released: 03/08/2005 Document Revised: 02/17/2015 Document Reviewed: 10/14/2014 Elsevier Interactive Patient Education 2016 [employer]  Pulmonary Edema Pulmonary edema (PE) is a condition in which fluid collects in the lungs. This makes it hard to breathe. PE may be a result of the heart not pumping very well or a result of injury.  CAUSES   Coronary artery disease causes blockages in the arteries of the heart. This deprives the heart muscle of oxygen and weakens the muscle. A heart attack is a form of coronary artery disease.  High blood pressure causes the heart muscle to work harder than usual. Over time, the heart muscle may get stiff, and it starts to work less efficiently. It may also fatigue and weaken.  Viral infection of the heart (myocarditis) may weaken the heart muscle.  Metabolic conditions such as thyroid disease, excessive alcohol use, certain vitamin deficiencies, or diabetes may also weaken the heart muscle.  Leaky or stiff heart valves may impair normal heart function.  Lung disease may strain the heart muscle.  Excessive demands on the heart such as too much salt or fluid intake.  Failure to take prescribed  medicines.  Lung injury from heat or toxins, such as poisonous gas.  Infection in the lungs or other parts of the body.  Fluid overload caused by kidney failure or medicines. SYMPTOMS   Shortness of breath at rest or with exertion.  Grunting, wheezing, or gurgling while breathing.  Feeling like you cannot get enough air.  Breaths are shallow and fast.  A lot of coughing with frothy or bloody mucus.  Skin may become cool, damp, and turn a pale or bluish color. DIAGNOSIS  Initial diagnosis may be based on your history, symptoms, and a physical examination. Additional tests for PE may include:  Electrocardiography.  Chest X-ray.  Blood tests.  Stress test.  Ultrasound evaluation of the heart (echocardiography).  Evaluation by a heart doctor (cardiologist).  Test of the heart arteries to look for blockages (angiography).  Check of blood oxygen. TREATMENT  Treatment of PE will depend on the underlying cause and will focus first on relieving the symptoms.   Extra oxygen to make breathing easier and assist with removing mucus. This may include breathing treatments or a tube into the lungs and a breathing machine.  Medicine to help the body get rid of extra water, usually through an IV tube.  Medicine to help the heart pump better.  If poor heart function is the cause, treatment may include:  Procedures to open blocked arteries, repair damaged heart valves, or remove some of the damaged heart muscle.  A pacemaker to help the heart pump with less effort. HOME CARE INSTRUCTIONS   Your health care provider will help you determine what type of exercise program may be helpful. It is important to maintain strength and increase it if possible. Pace your activities to avoid shortness of breath or chest pain. Rest for at least 1 hour before and after meals. Cardiac rehabilitation programs are available in some locations.  Eat a heart-healthy diet low in salt, saturated fat, and  cholesterol. Ask for help with choices.  Make a list of every medicine, vitamin, or herbal supplement you are taking. Keep the list with you at all times. Show it to your health care provider at every visit and before starting a new medicine. Keep the list up to date.  Ask your health care provider or pharmacist to help you write a plan or schedule so that you know things about each medicine such as:  Why you are taking it.  The possible side effects.  The best time of day to take it.  Foods to take with it or avoid.  When to stop taking it.  Record your hospital or clinic weight. When you get home, compare it to your scale and record your weight. Then, weigh yourself first thing in the morning daily, and record the weights. You should weigh yourself every morning after you urinate and before you eat breakfast. Wear the same amount of clothing each time you weigh yourself. Provide your health care provider with your weight record. Daily weights are important in the early recognition of excess fluid. Tell your health care provider right away if  you have gained 03 lb/1.4 kg in 1 day, 05 lb/2.3 kg in a week, or as directed by your health care provider. Your medicines may need to be adjusted.  Blood pressure monitoring should be done as often as directed. You can get a home blood pressure cuff at your drugstore. Record these values and bring them with you for your clinic visits. Notify your health care provider if you become dizzy or light-headed when standing up.  If you are currently a smoker, it is time to quit. Nicotine makes your heart work harder and is one of the leading causes of cardiac deaths. Do not use nicotine gum or patches before talking to your doctor.  Make a follow-up appointment with your health care provider as directed.  Ask your health care provider for a copy of your latest heart tracing (ECG) and keep a copy with you at all times. SEEK IMMEDIATE MEDICAL CARE IF:   You  have severe chest pain, especially if the pain is crushing or pressure-like and spreads to the arms, back, neck, or jaw. THIS IS AN EMERGENCY. Do not wait to see if the pain will go away. Call for local emergency medical help. Do not drive yourself to the hospital.  You have sweating, feel sick to your stomach (nauseous), or are experiencing shortness of breath.  Your weight increases by 03 lb/1.4 kg in 1 day or 05 lb/2.3 kg in a week.  You notice increasing shortness of breath that is unusual for you. This may happen during rest, sleep, or with activity.  You develop chest pain (angina) or pain that is unusual for you.  You notice more swelling in your hands, feet, ankles, or abdomen.  You notice lasting (persistent) dizziness, blurred vision, headache, or unsteadiness.  You begin to cough up bloody mucus (sputum).  You are unable to sleep because it is hard to breathe.  You begin to feel a "jumping" or "fluttering" sensation (palpitations) in the chest that is unusual for you. MAKE SURE YOU:  Understand these instructions.  Will watch your condition.  Will get help right away if you are not doing well or get worse.   This information is not intended to replace advice given to you by your health care provider. Make sure you discuss any questions you have with your health care provider.   Document Released: 08/19/2002 Document Revised: 06/03/2013 Document Reviewed: 02/03/2013 Elsevier Interactive Patient Education 2016 [place] Injury, Adult You have a head injury. Headaches and throwing up (vomiting) are common after a head injury. It should be easy to wake up from sleeping. Sometimes you must stay in the hospital. Most problems happen within the first 24 hours. Side effects may occur up to 7-10 days after the injury.  WHAT ARE THE TYPES OF HEAD INJURIES? Head injuries can be as minor as a bump. Some head injuries can be more severe. More severe head injuries  include:  A jarring injury to the brain (concussion).  A bruise of the brain (contusion). This mean there is bleeding in the brain that can cause swelling.  A cracked skull (skull fracture).  Bleeding in the brain that collects, clots, and forms a bump (hematoma). WHEN SHOULD I GET HELP RIGHT AWAY?   You are confused or sleepy.  You cannot be woken up.  You feel sick to your stomach (nauseous) or keep throwing up (vomiting).  Your dizziness or unsteadiness is getting worse.  You have very bad, lasting headaches that are  not helped by medicine. Take medicines only as told by your doctor.  You cannot use your arms or legs like normal.  You cannot walk.  You notice changes in the black spots in the center of the colored part of your eye (pupil).  You have clear or bloody fluid coming from your nose or ears.  You have trouble seeing. During the next 24 hours after the injury, you must stay with someone who can watch you. This person should get help right away (call 911 in the [place] if you start to shake and are not able to control it (have seizures), you pass out, or you are unable to wake up. HOW CAN I PREVENT A HEAD INJURY IN THE FUTURE?  Wear seat belts.  Wear a helmet while bike riding and playing sports like football.  Stay away from dangerous activities around the house. WHEN CAN I RETURN TO NORMAL ACTIVITIES AND ATHLETICS? See your doctor before doing these activities. You should not do normal activities or play contact sports until 1 week after the following symptoms have stopped:  Headache that does not go away.  Dizziness.  Poor attention.  Confusion.  Memory problems.  Sickness to your stomach or throwing up.  Tiredness.  Fussiness.  Bothered by bright lights or loud noises.  Anxiousness or depression.  Restless sleep. MAKE SURE YOU:   Understand these instructions.  Will watch your condition.  Will get help right away if you are not doing  well or get worse.   This information is not intended to replace advice given to you by your health care provider. Make sure you discuss any questions you have with your health care provider.   Document Released: 05/11/2008 Document Revised: 06/19/2014 Document Reviewed: 02/03/2013 Elsevier Interactive Patient Education [employer].

## 2015-07-17 NOTE — ED Provider Notes (Signed)
CSN: [id number]     Arrival date & time 07/17/15  1244 History  By signing my name below, I, Jaclyn Herrera, attest that this documentation has been prepared under the direction and in the presence of Jaclyn Bo, MD. Electronically Signed: Stephania Herrera, ED Scribe. 07/17/2015. 1:09 PM.   Chief Complaint  Patient presents with  . Fall   The history is provided by the patient. No language interpreter was used.    HPI Comments: Jaclyn Herrera is a 70 y.o. female with a history of DM, HTN, HLD, arthritis, and hypothyroidism, who presents to the Emergency Department brought in by ambulance, S/P falling out of bed this morning. Patient states she woke up this morning on the floor and was trying to stand up when she had a syncopal episode. She states afterward, she could not get up, so she laid on the floor before calling her niece and EMS arrived. She reports a bruise on her left forearm and notes that a band-aid had fallen off her left heel after she had scooted across the floor to get to the door. She notes intermittent episodes of severe, stabbing left-sided frontal head pain that began 1 week ago, lasting 5 seconds at a time, with 3 episodes per day. She states her pain only occurs on the left side of her head. She states about 4 days ago, she felt she could not control her right hand well enough to write, for about 30 seconds, and she thought she might have had another tiA. Patient states she had not yet eaten today and is currently hungry. She denies any face, neck, or back pain.  Past Medical History  Diagnosis Date  . Diabetes mellitus   . Hypertension   . Arthritis   . Shingles   . hypothyroidism   . Bronchitis   . Hyperlipidemia   . GERD (gastroesophageal reflux disease)   . PONV (postoperative nausea and vomiting)    Past Surgical History  Procedure Laterality Date  . Cholecystectomy    . Ankle surgery      x 4  . Non cancerous mass removed      small intestine  . Tonsillectomy    .  Nasal sinus surgery    . Cataract extraction Bilateral   . Appendectomy    . Knee arthroscopy    . Orif calcaneous fracture Left 12/29/2014    Procedure: OPEN REDUCTION INTERNAL FIXATION  LEFT CALCANEOUS FRACTURE;  Surgeon: Gaynelle Arabian, MD;  Location: [facility];  Service: Orthopedics;  Laterality: Left;   Family History  Problem Relation Age of Onset  . Diabetes Mother   . Asthma Mother   . Hypertension Father   . Heart disease Father   . Diabetes Father   . Heart disease Sister   . Hypertension Sister   . Asthma Sister   . Diabetes Brother   . CAD Brother   . Stroke Brother    Social History  Substance Use Topics  . Smoking status: Never Smoker   . Smokeless tobacco: Never Used  . Alcohol Use: No   OB History    No data available     Review of Systems A complete 10 system review of systems was obtained and all systems are negative except as noted in the HPI and PMH.     Allergies  Sulfonamide derivatives; Bactrim; Erythromycin; Mucinex; Aspirin; Levofloxacin; and Tape  Home Medications   Prior to Admission medications   Medication Sig Start Date End Date Taking? Authorizing  Provider  acetaminophen (TYLENOL) 500 MG tablet Take 500-1,000 mg by mouth every 6 (six) hours as needed for headache.     Historical Provider, MD  acetaminophen-codeine (TYLENOL #3) 300-30 MG tablet Take 1 tablet by mouth every 4 (four) hours as needed for moderate pain.    Historical Provider, MD  albuterol (PROVENTIL HFA;VENTOLIN HFA) 108 (90 BASE) MCG/ACT inhaler Inhale 2 puffs into the lungs every 4 (four) hours as needed for wheezing or shortness of breath.    Historical Provider, MD  bisacodyl (DULCOLAX) 10 MG suppository Place 1 suppository (10 mg total) rectally daily as needed for moderate constipation. 01/01/15   Arlee Muslim, PA-C  carisoprodol (SOMA) 350 MG tablet Take 175-350 mg by mouth daily as needed for muscle spasms. For cramps    Historical Provider, MD  docusate sodium (COLACE) 100  MG capsule Take 1 capsule (100 mg total) by mouth 2 (two) times daily. Patient not taking: Reported on 07/13/2015 01/01/15   Arlee Muslim, PA-C  esomeprazole (NEXIUM) 20 MG capsule Take 20 mg by mouth daily at 12 noon.    Historical Provider, MD  fexofenadine (ALLEGRA) 180 MG tablet Take 180 mg by mouth daily as needed for allergies or rhinitis.    Historical Provider, MD  fluticasone (FLONASE) 50 MCG/ACT nasal spray Place 2 sprays into the nose 2 (two) times daily as needed for allergies.  10/28/14   Historical Provider, MD  furosemide (LASIX) 20 MG tablet Take 20 mg by mouth 2 (two) times daily as needed for fluid.     Historical Provider, MD  insulin regular human CONCENTRATED (HUMULIN R) 500 UNIT/ML SOLN injection Inject 60-150 Units into the skin 3 (three) times daily before meals. Sliding scale range is reported 60-150 units based on a sliding scale. She takes it 30 minutes before meals.    Historical Provider, MD  levothyroxine (SYNTHROID, LEVOTHROID) 125 MCG tablet Take 125-187.5 mcg by mouth daily. Takes 125 mcg daily. On Sundays she takes 187.5 mcg    Historical Provider, MD  meclizine (ANTIVERT) 25 MG tablet Take 25 mg by mouth 2 (two) times daily.    Historical Provider, MD  methocarbamol (ROBAXIN) 500 MG tablet Take 1 tablet (500 mg total) by mouth every 6 (six) hours as needed for muscle spasms. 01/01/15   Arlee Muslim, PA-C  metoCLOPramide (REGLAN) 5 MG tablet Take 1 tablet (5 mg total) by mouth every 8 (eight) hours as needed for nausea (if ondansetron (ZOFRAN) ineffective.). 01/01/15   Arlee Muslim, PA-C  Multiple Vitamins-Minerals (DECUBI-VITE PO) Take by mouth daily. Reported on 06/22/2015    Historical Provider, MD  nebivolol (BYSTOLIC) 10 MG tablet Take 10 mg by mouth daily.    Historical Provider, MD  ondansetron (ZOFRAN) 4 MG tablet Take 1 tablet (4 mg total) by mouth every 6 (six) hours as needed for nausea. 01/01/15   Arlee Muslim, PA-C  oxyCODONE (OXY IR/ROXICODONE) 5 MG immediate  release tablet Take 1-2 tablets (5-10 mg total) by mouth every 3 (three) hours as needed for moderate pain or severe pain. 01/01/15   Arlee Muslim, PA-C  PARoxetine (PAXIL) 30 MG tablet Take 30 mg by mouth daily. Reported on 06/22/2015    Historical Provider, MD  Polyethylene Glycol 400 (BLINK TEARS OP) Place 1 drop into both eyes 4 (four) times daily as needed (Dry eyes).     Historical Provider, MD  potassium chloride (K-DUR) 10 MEQ tablet Take 1 tablet (10 mEq total) by mouth daily. 05/04/15   Rigoberto Noel, MD  quinapril (ACCUPRIL) 40 MG tablet Take 40 mg by mouth at bedtime.    Historical Provider, MD  Senna-Psyllium (PERDIEM PO) Take 1 tablet by mouth daily as needed (constipation). Reported on 06/22/2015    Historical Provider, MD  simvastatin (ZOCOR) 20 MG tablet Take 20 mg by mouth at bedtime.      Historical Provider, MD  spironolactone (ALDACTONE) 25 MG tablet Take 1 tablet (25 mg total) by mouth daily. Patient not taking: Reported on 07/13/2015 07/06/15   Fonnie Mu Parrett, NP   There were no vitals taken for this visit. Physical Exam  Constitutional: She is oriented to person, place, and time. She appears well-developed and well-nourished.  HENT:  Head: Normocephalic and atraumatic.  Eyes: Conjunctivae and EOM are normal. Pupils are equal, round, and reactive to light.  Neck: Normal range of motion and phonation normal. Neck supple.  Cardiovascular: Normal rate and regular rhythm.   Pulmonary/Chest: Effort normal and breath sounds normal. She exhibits no tenderness.  Abdominal: Soft. She exhibits no distension. There is no tenderness. There is no guarding.  Musculoskeletal: Normal range of motion.  Bruising to right forehead and right cheek. Bruising to left forearm. Healing wound on left heel.   Neurological: She is alert and oriented to person, place, and time. She exhibits normal muscle tone.  Skin: Skin is warm and dry.  Psychiatric: She has a normal mood and affect. Her behavior is  normal. Judgment and thought content normal.  Nursing note and vitals reviewed.   ED Course  Procedures (including critical care time)  DIAGNOSTIC STUDIES: Oxygen Saturation is 100% on RA, normal by my interpretation.    COORDINATION OF CARE: 12:59 PM - Discussed treatment plan with pt at bedside which includes diagnostic tests. Pt verbalized understanding and agreed to plan.   Labs Review Labs Reviewed  URINE CULTURE  BASIC METABOLIC PANEL  CBC WITH DIFFERENTIAL/PLATELET  URINALYSIS, ROUTINE W REFLEX MICROSCOPIC (NOT AT [facility])    Imaging Review No results found. I have personally reviewed and evaluated these images and lab results as part of my medical decision-making.   EKG Interpretation   Date/Time:  Saturday July 17 2015 12:50:49 EST Ventricular Rate:  72 PR Interval:  148 QRS Duration: 89 QT Interval:  478 QTC Calculation: 523 R Axis:   50 Text Interpretation:  Sinus rhythm Minimal ST depression, diffuse leads  Prolonged QT interval Baseline wander in lead(s) V2 Since last tracing QT  has lengthened Confirmed by Eulis Foster  MD, Vira Agar [id number]) on 07/17/2015 1:04:52  PM      MDM   Final diagnoses:  Prolonged Q-T interval on ECG  Fall, initial encounter  Facial contusion, initial encounter  Head injury, initial encounter  Chronic pulmonary edema    Fall without serious injury. Incidental, ongoing pulmonary edema with effusions, however, she is not compromised, by it at this time. She has been seeing pulmonology for the pulmonary effusions, and they are managing her by increasing doses of Lasix, with close follow-up, in consultation by cardiology. Falls, likely mechanical, and does not represent an unstable state. There is incidental note of prolonged QT, mild, but there is no reported syncope, or dizziness.   Nursing Notes Reviewed/ Care Coordinated, and agree without changes. Applicable Imaging Reviewed.  Interpretation of Laboratory Data incorporated into  ED treatment  Nursing Notes Reviewed/ Care Coordinated Applicable Imaging Reviewed Interpretation of Laboratory Data incorporated into ED treatment  The patient appears reasonably screened and/or stabilized for discharge and I doubt any other medical condition  or other [facility] requiring further screening, evaluation, or treatment in the ED at this time prior to discharge.  Plan: Home Medications- usual; Home Treatments- rest; return here if the recommended treatment, does not improve the symptoms; Recommended follow up- PCP and Pulm./Cards. prn   I personally performed the services described in this documentation, which was scribed in my presence. The recorded information has been reviewed and is accurate.      Jaclyn Bo, MD 07/17/15 [phone]

## 2015-07-17 NOTE — ED Notes (Signed)
Patient with no complaints at this time. Respirations even and unlabored. Skin warm/dry. Discharge instructions reviewed with patient at this time. Patient given opportunity to voice concerns/ask questions. Patient discharged at this time and left Emergency Department with steady gait.   

## 2015-07-17 NOTE — ED Notes (Signed)
Patient brought in via EMS from home. Alert and oriented. Airway patent. Patient woke this morning with slurred speech, right facial drooping, and headache. Patient fell this morning and was unable to get up. Patient denies LOC. Patient does report hitting head. Abrasion noted to forehead.  Denies any pain. Patient has hx of TIAs. Patient denies headache now. Per EMS patient's blood sugar was 77.  No neurological deficits noted at this time.

## 2015-07-20 ENCOUNTER — Telehealth: Payer: Self-pay | Admitting: Cardiovascular Disease

## 2015-07-20 NOTE — Telephone Encounter (Signed)
Pt states for about 2 months she has had trouble with breathing.

## 2015-07-20 NOTE — Telephone Encounter (Signed)
Pt states she has been seeing Dr Elsworth Soho and has had fluid taken off her lungs.  Pt states she has been very short of breath recently, she has been told that she cannot have fluid taken off her lungs again, has been referred to Dr Angelena Form for further evaluation and treatment.  Pt has an appt with Dr Angelena Form 07/23/15 and is asking for sooner appt. Pt offered appt with another provider 07/21/15 but declined. Pt only wants to see Dr Angelena Form. I offered pt appt 07/22/15 with Dr Angelena Form, pt unable to make that appt, pt states she will keep appt already scheduled for 07/23/15.

## 2015-07-20 NOTE — Telephone Encounter (Signed)
New message      Talk to the nurse----

## 2015-07-22 DIAGNOSIS — S92035D Nondisplaced avulsion fracture of tuberosity of left calcaneus, subsequent encounter for fracture with routine healing: Secondary | ICD-10-CM | POA: Diagnosis not present

## 2015-07-22 DIAGNOSIS — L97421 Non-pressure chronic ulcer of left heel and midfoot limited to breakdown of skin: Secondary | ICD-10-CM | POA: Diagnosis not present

## 2015-07-23 ENCOUNTER — Ambulatory Visit (INDEPENDENT_AMBULATORY_CARE_PROVIDER_SITE_OTHER): Payer: Medicare Other | Admitting: Cardiovascular Disease

## 2015-07-23 ENCOUNTER — Encounter: Payer: Self-pay | Admitting: Cardiovascular Disease

## 2015-07-23 VITALS — BP 120/72 | HR 52 | Ht 68.0 in | Wt 210.8 lb

## 2015-07-23 DIAGNOSIS — M1990 Unspecified osteoarthritis, unspecified site: Secondary | ICD-10-CM | POA: Diagnosis not present

## 2015-07-23 DIAGNOSIS — I5032 Chronic diastolic (congestive) heart failure: Secondary | ICD-10-CM | POA: Diagnosis not present

## 2015-07-23 DIAGNOSIS — L89620 Pressure ulcer of left heel, unstageable: Secondary | ICD-10-CM | POA: Diagnosis not present

## 2015-07-23 DIAGNOSIS — I10 Essential (primary) hypertension: Secondary | ICD-10-CM | POA: Diagnosis not present

## 2015-07-23 DIAGNOSIS — F329 Major depressive disorder, single episode, unspecified: Secondary | ICD-10-CM | POA: Diagnosis not present

## 2015-07-23 DIAGNOSIS — I340 Nonrheumatic mitral (valve) insufficiency: Secondary | ICD-10-CM

## 2015-07-23 DIAGNOSIS — E119 Type 2 diabetes mellitus without complications: Secondary | ICD-10-CM | POA: Diagnosis not present

## 2015-07-23 DIAGNOSIS — M6281 Muscle weakness (generalized): Secondary | ICD-10-CM | POA: Diagnosis not present

## 2015-07-23 MED ORDER — FUROSEMIDE 20 MG PO TABS: ORAL_TABLET | ORAL | Status: DC

## 2015-07-23 NOTE — Progress Notes (Signed)
Chief Complaint  Patient presents with  . Follow-up    SOB, swelling in feet and dizziness      History of Present Illness: 70 yo female with history of DM, HTN, HLD, hypothyroidism, GERD, anemia here today for cardiac follow up. I saw her as a new patient 04/30/13  for evaluation of dyspnea. She has no prior documented cardiac disease. She told me that she has been having dyspnea with minimal exertion. She has recently gained 29 lbs over last 6 months. She also describes chest pressure, center of chest when walking. Resolves quickly with rest. She also has a dry hacking cough. She has a rescue inhaler for frequent episodes of bronchitis. She had a stress test 15 years ago. She had a cardiac cath that year and was told it was normal. Chronic right lower ext edema from orthopedic injury. Echo 05/15/13 with moderate LVH, normal LV function, grade 2 diastolic dysfunction, mild MR. Lexiscan stress myoview 05/22/13 with no ischemia. She was seen in our office by Cecilie Kicks, NP in November 2016 for chest pain, dyspnea. D-dimer was elevated which lead to a CTA of the chest. This showed no PE but there were bilateral pleural effusions. She was then seen by Dr. Elsworth Soho in the pulmonary office and underwent thoracentesis on 06/04/15. She was started on Lasix and Norvasc was stopped. Echo 06/1915 with normal LV systolic function, elevated filling pressures and mild MR.   She is here today for follow up.  She is taking Lasix 60 mg in the am and 20 mg in the pm. Her LE edema is improved. She is down 10 lbs over last 2 weeks. No SOB. No chest pain.   Primary Care Physician: Asencion Noble, MD  Endocrine: Forde Dandy   Past Medical History  Diagnosis Date  . Diabetes mellitus   . Hypertension   . Arthritis   . Shingles   . hypothyroidism   . Bronchitis   . Hyperlipidemia   . GERD (gastroesophageal reflux disease)   . PONV (postoperative nausea and vomiting)     Past Surgical History  Procedure Laterality Date  .  Cholecystectomy    . Ankle surgery      x 4  . Non cancerous mass removed      small intestine  . Tonsillectomy    . Nasal sinus surgery    . Cataract extraction Bilateral   . Appendectomy    . Knee arthroscopy    . Orif calcaneous fracture Left 12/29/2014    Procedure: OPEN REDUCTION INTERNAL FIXATION  LEFT CALCANEOUS FRACTURE;  Surgeon: Gaynelle Arabian, MD;  Location: [facility];  Service: Orthopedics;  Laterality: Left;    Current Outpatient Prescriptions  Medication Sig Dispense Refill  . acetaminophen (TYLENOL) 500 MG tablet Take 500-1,000 mg by mouth every 6 (six) hours as needed for headache. Reported on 07/17/2015    . acetaminophen-codeine (TYLENOL #3) 300-30 MG tablet Take 1 tablet by mouth every 4 (four) hours as needed for moderate pain.    Marland Kitchen albuterol (PROVENTIL HFA;VENTOLIN HFA) 108 (90 BASE) MCG/ACT inhaler Inhale 2 puffs into the lungs every 4 (four) hours as needed for wheezing or shortness of breath.    . bisacodyl (DULCOLAX) 10 MG suppository Place 1 suppository (10 mg total) rectally daily as needed for moderate constipation. 12 suppository 0  . carisoprodol (SOMA) 350 MG tablet Take 175-350 mg by mouth daily as needed for muscle spasms. For cramps    . esomeprazole (NEXIUM) 20 MG capsule Take 20 mg by  mouth daily at 12 noon.    . fexofenadine (ALLEGRA) 180 MG tablet Take 180 mg by mouth daily as needed for allergies or rhinitis.    . fluticasone (FLONASE) 50 MCG/ACT nasal spray Place 2 sprays into the nose 2 (two) times daily as needed for allergies.     Marland Kitchen insulin regular human CONCENTRATED (HUMULIN R) 500 UNIT/ML SOLN injection Inject 60-150 Units into the skin 3 (three) times daily before meals. Sliding scale range is reported 60-150 units based on a sliding scale. She takes it 30 minutes before meals.    Marland Kitchen levothyroxine (SYNTHROID, LEVOTHROID) 125 MCG tablet Take 125-187.5 mcg by mouth daily. Takes 125 mcg daily. On Sundays she takes 187.5 mcg    . meclizine (ANTIVERT) 25 MG  tablet Take 25 mg by mouth 2 (two) times daily.    . metoCLOPramide (REGLAN) 5 MG tablet Take 1 tablet (5 mg total) by mouth every 8 (eight) hours as needed for nausea (if ondansetron (ZOFRAN) ineffective.). 40 tablet 0  . Multiple Vitamins-Minerals (DECUBI-VITE PO) Take by mouth daily. Reported on 06/22/2015    . nebivolol (BYSTOLIC) 10 MG tablet Take 10 mg by mouth daily.    . ondansetron (ZOFRAN) 4 MG tablet Take 1 tablet (4 mg total) by mouth every 6 (six) hours as needed for nausea. 40 tablet 0  . PARoxetine (PAXIL) 30 MG tablet Take 40 mg by mouth daily. Reported on 06/22/2015    . Polyethylene Glycol 400 (BLINK TEARS OP) Place 1 drop into both eyes 4 (four) times daily as needed (Dry eyes).     . potassium chloride (K-DUR) 10 MEQ tablet Take 1 tablet (10 mEq total) by mouth daily. 14 tablet 0  . quinapril (ACCUPRIL) 40 MG tablet Take 40 mg by mouth at bedtime.    . Senna-Psyllium (PERDIEM PO) Take 1 tablet by mouth daily as needed (constipation). Reported on 07/17/2015    . simvastatin (ZOCOR) 20 MG tablet Take 20 mg by mouth at bedtime. Reported on 07/17/2015    . furosemide (LASIX) 20 MG tablet Take 3 tablets every morning and one tablet every afternoon 120 tablet 6   No current facility-administered medications for this visit.    Allergies  Allergen Reactions  . Sulfonamide Derivatives Anaphylaxis  . Bactrim [Sulfamethoxazole-Trimethoprim] Nausea Only  . Erythromycin Nausea And Vomiting  . Mucinex [Guaifenesin Er] Nausea And Vomiting  . Aspirin Rash  . Levofloxacin Rash  . Tape Rash    Social History   Social History  . Marital Status: Widowed    Spouse Name: N/A  . Number of Children: N/A  . Years of Education: N/A   Occupational History  . Retired-[employer]    Social History Main Topics  . Smoking status: Never Smoker   . Smokeless tobacco: Never Used  . Alcohol Use: No  . Drug Use: No  . Sexual Activity: Not Currently   Other Topics Concern  . Not on file   Social  History Narrative    Family History  Problem Relation Age of Onset  . Diabetes Mother   . Asthma Mother   . Hypertension Father   . Heart disease Father   . Diabetes Father   . Heart disease Sister   . Hypertension Sister   . Asthma Sister   . Diabetes Brother   . CAD Brother   . Stroke Brother     Review of Systems:  As stated in the HPI and otherwise negative.   BP 120/72 mmHg  Pulse  52  Ht 5\' 8"  (1.727 m)  Wt 210 lb 12.8 oz (95.618 kg)  BMI 32.06 kg/m2  SpO2 98%  Physical Examination: General: Well developed, well nourished, NAD HEENT: OP clear, mucus membranes moist SKIN: warm, dry. No rashes. Neuro: No focal deficits Musculoskeletal: Muscle strength 5/5 all ext Psychiatric: Mood and affect normal Neck: No JVD, no carotid bruits, no thyromegaly, no lymphadenopathy. Lungs:Clear bilaterally, no wheezes, rhonci, crackles Cardiovascular: Regular rate and rhythm. No murmurs, gallops or rubs. Abdomen:Soft. Bowel sounds present. Non-tender.  Extremities: Trace bilateral lower extremity edema.   Echo 07/01/15: Left ventricle: The cavity size was normal. Wall thickness was increased in a pattern of mild LVH. Systolic function was normal. The estimated ejection fraction was in the range of 60% to 65%. Doppler parameters are consistent with elevated ventricular end-diastolic filling pressure. - Mitral valve: Calcified annulus. Mildly thickened leaflets . There was mild to moderate regurgitation. - Left atrium: The atrium was moderately dilated. - Atrial septum: No defect or patent foramen ovale was identified.  Lexiscan stress myoview 05/22/13: Stress Procedure: The patient received IV Lexiscan 0.4 mg over 15-seconds. Technetium 21m Sestamibi injected at 30-seconds. This patient had sob with the Lexiscan injection. Quantitative spect images were obtained after a 45 minute delay.  Stress ECG: No significant change from baseline ECG  QPS  Raw Data Images:  Normal; no motion artifact; normal heart/lung ratio.  Stress Images: Normal homogeneous uptake in all areas of the myocardium.  Rest Images: Normal homogeneous uptake in all areas of the myocardium.  Subtraction (SDS): Normal  Transient Ischemic Dilatation (Normal <1.22): 1.06  Lung/Heart Ratio (Normal <0.45): 0.40  Quantitative Gated Spect Images  QGS EDV: 92 ml  QGS ESV: 28 ml  Impression  Exercise Capacity: Lexiscan with no exercise.  BP Response: Normal blood pressure response.  Clinical Symptoms: There is dyspnea.  ECG Impression: No significant ST segment change suggestive of ischemia.  Comparison with Prior Nuclear Study: No images to compare  Overall Impression: Normal stress nuclear study.  LV Ejection Fraction: 70%. LV Wall Motion: NL LV Function; NL Wall Motion   EKG:  EKG is not ordered today. The ekg ordered today demonstrates   Recent Labs: 07/06/2015: ALT 26; Pro B Natriuretic peptide (BNP) 638.0* 07/17/2015: BUN 26*; Creatinine, Ser 0.84; Hemoglobin 11.5*; Platelets 184; Potassium 3.8; Sodium 140   Lipid Panel No results found for: CHOL, TRIG, HDL, CHOLHDL, VLDL, LDLCALC, LDLDIRECT   Wt Readings from Last 3 Encounters:  07/23/15 210 lb 12.8 oz (95.618 kg)  07/13/15 215 lb 3.2 oz (97.614 kg)  07/06/15 220 lb (99.791 kg)     Other studies Reviewed: Additional studies/ records that were reviewed today include: . Review of the above records demonstrates:    Assessment and Plan:   1. Acute on chronic diastolic CHF: Her weight is down 10 lbs over last 2 weeks. Renal function is stable. LE edema is improved. Will continue Lasix 60 mg in the am and 20 mg in the pm She will weigh herself daily an adjust Lasix as needed. Continue potassium.   2. Chest pain: Resolved. Likely non-cardiac. She is known to have normal coronary arteries. Stress test 2014 with no ischemia.    3. Mitral regurgitation: Mild by echo 2017  4. HTN: BP controlled. No changes.   Current  medicines are reviewed at length with the patient today.  The patient does not have concerns regarding medicines.  The following changes have been made:  no change  Labs/ tests ordered  today include:   Orders Placed This Encounter  Procedures  . Basic Metabolic Panel (BMET)     Disposition:   FU with me in 2 months   Signed, Lauree Chandler, MD 07/23/2015 9:50 AM    [facility] [address], [place], [place]  [address] Phone: [phone]; Fax: [phone]

## 2015-07-23 NOTE — Patient Instructions (Signed)
Medication Instructions:  Your physician recommends that you continue on your current medications as directed. Please refer to the Current Medication list given to you today.   Labwork: Your physician recommends that you return for lab work on February 23,2017.  The lab opens at 7:30 AM   Testing/Procedures: None   Follow-Up: Your physician recommends that you schedule a follow-up appointment in: 2 months--Scheduled for April 5,2017 at 11:00    Any Other Special Instructions Will Be Listed Below (If Applicable).     If you need a refill on your cardiac medications before your next appointment, please call your pharmacy.

## 2015-07-30 DIAGNOSIS — F329 Major depressive disorder, single episode, unspecified: Secondary | ICD-10-CM | POA: Diagnosis not present

## 2015-07-30 DIAGNOSIS — E119 Type 2 diabetes mellitus without complications: Secondary | ICD-10-CM | POA: Diagnosis not present

## 2015-07-30 DIAGNOSIS — I10 Essential (primary) hypertension: Secondary | ICD-10-CM | POA: Diagnosis not present

## 2015-07-30 DIAGNOSIS — M6281 Muscle weakness (generalized): Secondary | ICD-10-CM | POA: Diagnosis not present

## 2015-07-30 DIAGNOSIS — L89620 Pressure ulcer of left heel, unstageable: Secondary | ICD-10-CM | POA: Diagnosis not present

## 2015-07-30 DIAGNOSIS — M1990 Unspecified osteoarthritis, unspecified site: Secondary | ICD-10-CM | POA: Diagnosis not present

## 2015-08-02 ENCOUNTER — Ambulatory Visit: Payer: Medicare Other | Admitting: Cardiovascular Disease

## 2015-08-04 ENCOUNTER — Emergency Department (HOSPITAL_COMMUNITY): Payer: Medicare Other

## 2015-08-04 ENCOUNTER — Telehealth: Payer: Self-pay | Admitting: Cardiovascular Disease

## 2015-08-04 ENCOUNTER — Emergency Department (HOSPITAL_COMMUNITY)
Admission: EM | Admit: 2015-08-04 | Discharge: 2015-08-04 | Disposition: A | Discharge status: Home / Self Care | Payer: Medicare Other | Attending: Emergency Medicine | Admitting: Emergency Medicine

## 2015-08-04 ENCOUNTER — Encounter (HOSPITAL_COMMUNITY): Payer: Self-pay | Admitting: Emergency Medicine

## 2015-08-04 DIAGNOSIS — I10 Essential (primary) hypertension: Secondary | ICD-10-CM

## 2015-08-04 DIAGNOSIS — R29810 Facial weakness: Secondary | ICD-10-CM | POA: Diagnosis not present

## 2015-08-04 DIAGNOSIS — Z8719 Personal history of other diseases of the digestive system: Secondary | ICD-10-CM | POA: Insufficient documentation

## 2015-08-04 DIAGNOSIS — R51 Headache: Secondary | ICD-10-CM | POA: Insufficient documentation

## 2015-08-04 DIAGNOSIS — I509 Heart failure, unspecified: Secondary | ICD-10-CM | POA: Insufficient documentation

## 2015-08-04 DIAGNOSIS — J810 Acute pulmonary edema: Secondary | ICD-10-CM | POA: Diagnosis not present

## 2015-08-04 DIAGNOSIS — M1990 Unspecified osteoarthritis, unspecified site: Secondary | ICD-10-CM | POA: Insufficient documentation

## 2015-08-04 DIAGNOSIS — R4781 Slurred speech: Secondary | ICD-10-CM | POA: Diagnosis not present

## 2015-08-04 DIAGNOSIS — Z794 Long term (current) use of insulin: Secondary | ICD-10-CM | POA: Insufficient documentation

## 2015-08-04 DIAGNOSIS — E785 Hyperlipidemia, unspecified: Secondary | ICD-10-CM | POA: Diagnosis not present

## 2015-08-04 DIAGNOSIS — E11649 Type 2 diabetes mellitus with hypoglycemia without coma: Secondary | ICD-10-CM | POA: Insufficient documentation

## 2015-08-04 DIAGNOSIS — E039 Hypothyroidism, unspecified: Secondary | ICD-10-CM | POA: Insufficient documentation

## 2015-08-04 DIAGNOSIS — R05 Cough: Secondary | ICD-10-CM | POA: Diagnosis not present

## 2015-08-04 DIAGNOSIS — Z79899 Other long term (current) drug therapy: Secondary | ICD-10-CM | POA: Diagnosis not present

## 2015-08-04 DIAGNOSIS — R519 Headache, unspecified: Secondary | ICD-10-CM

## 2015-08-04 DIAGNOSIS — E161 Other hypoglycemia: Secondary | ICD-10-CM | POA: Diagnosis not present

## 2015-08-04 DIAGNOSIS — R4182 Altered mental status, unspecified: Secondary | ICD-10-CM | POA: Diagnosis present

## 2015-08-04 DIAGNOSIS — G8929 Other chronic pain: Secondary | ICD-10-CM

## 2015-08-04 DIAGNOSIS — E162 Hypoglycemia, unspecified: Secondary | ICD-10-CM

## 2015-08-04 DIAGNOSIS — I110 Hypertensive heart disease with heart failure: Secondary | ICD-10-CM | POA: Diagnosis not present

## 2015-08-04 DIAGNOSIS — R079 Chest pain, unspecified: Secondary | ICD-10-CM | POA: Diagnosis not present

## 2015-08-04 LAB — COMPREHENSIVE METABOLIC PANEL
ALBUMIN: 4 g/dL (ref 3.5–5.0)
ALK PHOS: 129 U/L — AB (ref 38–126)
ALT: 16 U/L (ref 14–54)
ANION GAP: 11 (ref 5–15)
AST: 28 U/L (ref 15–41)
BUN: 20 mg/dL (ref 6–20)
CO2: 30 mmol/L (ref 22–32)
CREATININE: 0.86 mg/dL (ref 0.44–1.00)
Calcium: 8.7 mg/dL — ABNORMAL LOW (ref 8.9–10.3)
Chloride: 99 mmol/L — ABNORMAL LOW (ref 101–111)
GFR calc non Af Amer: >60 mL/min (ref >60)
Glucose, Bld: 91 mg/dL (ref 65–99)
POTASSIUM: 3.6 mmol/L (ref 3.5–5.1)
Sodium: 140 mmol/L (ref 135–145)
Total Bilirubin: 0.5 mg/dL (ref 0.3–1.2)
Total Protein: 6.9 g/dL (ref 6.5–8.1)

## 2015-08-04 LAB — CBC WITH DIFFERENTIAL/PLATELET
BASOS ABS: 0 10*3/uL (ref 0.0–0.1)
Basophils Relative: 0 %
Eosinophils Absolute: 0.1 10*3/uL (ref 0.0–0.7)
Eosinophils Relative: 1 %
HEMATOCRIT: 35.7 % — AB (ref 36.0–46.0)
Hemoglobin: 11.9 g/dL — ABNORMAL LOW (ref 12.0–15.0)
LYMPHS PCT: 7 %
Lymphs Abs: 0.6 10*3/uL — ABNORMAL LOW (ref 0.7–4.0)
MCH: 27.2 pg (ref 26.0–34.0)
MCHC: 33.3 g/dL (ref 30.0–36.0)
MCV: 81.5 fL (ref 78.0–100.0)
Monocytes Absolute: 0.5 10*3/uL (ref 0.1–1.0)
Monocytes Relative: 5 %
NEUTROS ABS: 7.8 10*3/uL — AB (ref 1.7–7.7)
Neutrophils Relative %: 87 %
Platelets: 213 10*3/uL (ref 150–400)
RBC: 4.38 MIL/uL (ref 3.87–5.11)
RDW: 15.3 % (ref 11.5–15.5)
WBC: 9 10*3/uL (ref 4.0–10.5)

## 2015-08-04 LAB — CBG MONITORING, ED
GLUCOSE-CAPILLARY: 107 mg/dL — AB (ref 65–99)
GLUCOSE-CAPILLARY: 85 mg/dL (ref 65–99)
GLUCOSE-CAPILLARY: 162 mg/dL — AB (ref 65–99)

## 2015-08-04 LAB — URINALYSIS, ROUTINE W REFLEX MICROSCOPIC
BILIRUBIN URINE: NEGATIVE
Glucose, UA: NEGATIVE mg/dL
Ketones, ur: NEGATIVE mg/dL
LEUKOCYTES UA: NEGATIVE
NITRITE: NEGATIVE
PH: 5.5 (ref 5.0–8.0)
Protein, ur: >300 mg/dL — AB
SPECIFIC GRAVITY, URINE: 1.025 (ref 1.005–1.030)

## 2015-08-04 LAB — URINE MICROSCOPIC-ADD ON

## 2015-08-04 LAB — TROPONIN I: Troponin I: <0.03 ng/mL (ref <0.031)

## 2015-08-04 LAB — BRAIN NATRIURETIC PEPTIDE: B NATRIURETIC PEPTIDE 5: 424 pg/mL — AB (ref 0.0–100.0)

## 2015-08-04 MED ORDER — LISINOPRIL 10 MG PO TABS: 40.0000 mg | ORAL_TABLET | Freq: Every day | ORAL | Status: DC

## 2015-08-04 MED ORDER — FUROSEMIDE 10 MG/ML IJ SOLN
40.0000 mg | Freq: Once | INTRAMUSCULAR | Status: AC
  Administered 2015-08-04: 40 mg via INTRAVENOUS
  Filled 2015-08-04: qty 4

## 2015-08-04 NOTE — ED Notes (Signed)
Pt walked from room to bathroom and o2 stayed around 94% on room air.

## 2015-08-04 NOTE — Telephone Encounter (Signed)
Spoke with pt and told her blood work for tomorrow can be cancelled. She will come in for BMP on 3/3

## 2015-08-04 NOTE — Telephone Encounter (Signed)
New message      Pt is due to have labs drawn tomorrow.  She was in the [facility] ER yesterday-----will she still need to come tomorrow for more labs work?

## 2015-08-04 NOTE — ED Provider Notes (Signed)
TIME SEEN: 1:10 AM  CHIEF COMPLAINT: Hypoglycemia  HPI: Pt is a 70 y.o. female with history of hypertension, diabetes, hypothyroidism, hyperlipidemia, CHF, TIA who presents to the emergency department with altered mental status, aphasia, dysarthria and left-sided facial droop. Patient's niece called EMS concerned for a stroke. EMS found patient's blood glucose to be 43. She was given dextrose and blood sugar improved in her neurologic symptoms resolved.  States she is not sure why her blood sugar is low. States she thinks she took her insulin correctly. Denies any recent infectious symptoms. No changes in her diabetic regimen. Denies vomiting or diarrhea.  While in the emergency department, patient has multiple chronic complaints. She states she has had several months of intermittent diffuse, mild aching headaches. She denies any new numbness, tingling or focal weakness. No fevers. No neck pain or neck stiffness.   Also complains of weeks of intermittent chest pain she describes as a pressure that she is not having currently in shortness of breath. She was seen by her cardiologist on February 10. She is on Lasix for CHF and at that time appeared to be diuresing well. States she does have some more shortness of breath with lying flat. She does not wear oxygen at home. No fever or cough.    Also states that she has had several months of episodes of passing out. Her last syncopal event was over 2 weeks ago. States that she injured her left forearm during this episode. States she just "blacked out". Denies any worsening headache during these episodes, chest pain or shortness of breath, palpitations or dizziness. No known history of seizure. This has been worked up in the emergency department and as an outpatient per her report.   PCP is Dr. Willey Blade, endocrinologist is Dr. Forde Dandy, cardiologist is Dr. Julianne Handler, pulmonologist is Dr. Elsworth Soho     ROS: See HPI Constitutional: no fever  Eyes: no drainage   ENT: no runny nose   Cardiovascular:  Intermittent chest pain but none currently Resp: Intermittent SOB  GI: no vomiting GU: no dysuria Integumentary: no rash  Allergy: no hives  Musculoskeletal: Improving leg swelling  Neurological: no slurred speech ROS otherwise negative  PAST MEDICAL HISTORY/PAST SURGICAL HISTORY:  Past Medical History  Diagnosis Date  . Diabetes mellitus   . Hypertension   . Arthritis   . Shingles   . hypothyroidism   . Bronchitis   . Hyperlipidemia   . GERD (gastroesophageal reflux disease)   . PONV (postoperative nausea and vomiting)     MEDICATIONS:  Prior to Admission medications   Medication Sig Start Date End Date Taking? Authorizing Provider  acetaminophen (TYLENOL) 500 MG tablet Take 500-1,000 mg by mouth every 6 (six) hours as needed for headache. Reported on 07/17/2015    Historical Provider, MD  acetaminophen-codeine (TYLENOL #3) 300-30 MG tablet Take 1 tablet by mouth every 4 (four) hours as needed for moderate pain.    Historical Provider, MD  albuterol (PROVENTIL HFA;VENTOLIN HFA) 108 (90 BASE) MCG/ACT inhaler Inhale 2 puffs into the lungs every 4 (four) hours as needed for wheezing or shortness of breath.    Historical Provider, MD  bisacodyl (DULCOLAX) 10 MG suppository Place 1 suppository (10 mg total) rectally daily as needed for moderate constipation. 01/01/15   Arlee Muslim, PA-C  carisoprodol (SOMA) 350 MG tablet Take 175-350 mg by mouth daily as needed for muscle spasms. For cramps    Historical Provider, MD  esomeprazole (NEXIUM) 20 MG capsule Take 20 mg by mouth  daily at 12 noon.    Historical Provider, MD  fexofenadine (ALLEGRA) 180 MG tablet Take 180 mg by mouth daily as needed for allergies or rhinitis.    Historical Provider, MD  fluticasone (FLONASE) 50 MCG/ACT nasal spray Place 2 sprays into the nose 2 (two) times daily as needed for allergies.  10/28/14   Historical Provider, MD  furosemide (LASIX) 20 MG tablet Take 3 tablets  every morning and one tablet every afternoon 07/23/15   Burnell Blanks, MD  insulin regular human CONCENTRATED (HUMULIN R) 500 UNIT/ML SOLN injection Inject 60-150 Units into the skin 3 (three) times daily before meals. Sliding scale range is reported 60-150 units based on a sliding scale. She takes it 30 minutes before meals.    Historical Provider, MD  levothyroxine (SYNTHROID, LEVOTHROID) 125 MCG tablet Take 125-187.5 mcg by mouth daily. Takes 125 mcg daily. On Sundays she takes 187.5 mcg    Historical Provider, MD  meclizine (ANTIVERT) 25 MG tablet Take 25 mg by mouth 2 (two) times daily.    Historical Provider, MD  metoCLOPramide (REGLAN) 5 MG tablet Take 1 tablet (5 mg total) by mouth every 8 (eight) hours as needed for nausea (if ondansetron (ZOFRAN) ineffective.). 01/01/15   Arlee Muslim, PA-C  Multiple Vitamins-Minerals (DECUBI-VITE PO) Take by mouth daily. Reported on 06/22/2015    Historical Provider, MD  nebivolol (BYSTOLIC) 10 MG tablet Take 10 mg by mouth daily.    Historical Provider, MD  ondansetron (ZOFRAN) 4 MG tablet Take 1 tablet (4 mg total) by mouth every 6 (six) hours as needed for nausea. 01/01/15   Arlee Muslim, PA-C  PARoxetine (PAXIL) 30 MG tablet Take 40 mg by mouth daily. Reported on 06/22/2015    Historical Provider, MD  Polyethylene Glycol 400 (BLINK TEARS OP) Place 1 drop into both eyes 4 (four) times daily as needed (Dry eyes).     Historical Provider, MD  potassium chloride (K-DUR) 10 MEQ tablet Take 1 tablet (10 mEq total) by mouth daily. 05/04/15   Rigoberto Noel, MD  quinapril (ACCUPRIL) 40 MG tablet Take 40 mg by mouth at bedtime.    Historical Provider, MD  Senna-Psyllium (PERDIEM PO) Take 1 tablet by mouth daily as needed (constipation). Reported on 07/17/2015    Historical Provider, MD  simvastatin (ZOCOR) 20 MG tablet Take 20 mg by mouth at bedtime. Reported on 07/17/2015    Historical Provider, MD    ALLERGIES:  Allergies  Allergen Reactions  . Sulfonamide  Derivatives Anaphylaxis  . Bactrim [Sulfamethoxazole-Trimethoprim] Nausea Only  . Erythromycin Nausea And Vomiting  . Mucinex [Guaifenesin Er] Nausea And Vomiting  . Aspirin Rash  . Levofloxacin Rash  . Tape Rash    SOCIAL HISTORY:  Social History  Substance Use Topics  . Smoking status: Never Smoker   . Smokeless tobacco: Never Used  . Alcohol Use: No    FAMILY HISTORY: Family History  Problem Relation Age of Onset  . Diabetes Mother   . Asthma Mother   . Hypertension Father   . Heart disease Father   . Diabetes Father   . Heart disease Sister   . Hypertension Sister   . Asthma Sister   . Diabetes Brother   . CAD Brother   . Stroke Brother     EXAM: BP 202/79 mmHg  Pulse 67  Temp(Src) 97.8 F (36.6 C)  Resp 20  Ht 5\' 8"  (1.727 m)  Wt 203 lb (92.08 kg)  BMI 30.87 kg/m2  SpO2 99%  CONSTITUTIONAL: Alert and oriented and responds appropriately to questions. Well-appearing; well-nourished, elderly but in distress, pleasant, afebrile and nontoxic HEAD: Normocephalic EYES: Conjunctivae clear, PERRL, extraocular movements intact ENT: normal nose; no rhinorrhea; moist mucous membranes; pharynx without lesions noted, no tonsillar hypertrophy or exudate NECK: Supple, no meningismus, no LAD, no JVD  CARD: RRR; S1 and S2 appreciated; no murmurs, no clicks, no rubs, no gallops RESP: Normal chest excursion without splinting or tachypnea; breath sounds clear and equal bilaterally; no wheezes, no rhonchi, no rales, no hypoxia or respiratory distress, speaking full sentences ABD/GI: Normal bowel sounds; non-distended; soft, non-tender, no rebound, no guarding, no peritoneal signs BACK:  The back appears normal and is non-tender to palpation, there is no CVA tenderness EXT: Normal ROM in all joints; non-tender to palpation; no edema; normal capillary refill; no cyanosis, no calf tenderness or swelling    SKIN: Normal color for age and race; warm, no rash NEURO: Moves all  extremities equally, sensation to light touch intact diffusely, cranial nerves II through XII intact, no aphasia or dysarthria, no pronator drift PSYCH: The patient's mood and manner are appropriate. Grooming and personal hygiene are appropriate.  MEDICAL DECISION MAKING: Patient here with concerns for neurologic deficits in the setting of hypoglycemia that resolved as soon as her blood sugar was corrected. She is noted to be hypertensive in the emergency department but on review of patient's previous records this appears to be something bad is chronic for her. She states she thinks that she took her blood pressure medication and insulin tonight.  Patient also has multiple chronic complaints including chest pain, shortness of breath, headaches and syncopal events that have all been worked up as an outpatient. She does not appear significantly volume overloaded currently. She is not having any active chest pain. States she did feel slightly short of breath with lying flat tonight but her lungs are clear and she is not hypoxic.     Plan is to obtain labs, chest x-ray, head CT, urine and continue to closely monitor her blood pressure and blood glucose.  ED PROGRESS: Patient's labs are unremarkable. Troponin is negative. BNP is mildly elevated greater than 400. She is not having any urinary symptoms. Her chest x-ray shows mildly increased pulmonary edema and bilateral pleural effusions compared to recent chest x-ray on February 4. She does not appear significantly volume overloaded on exam and in fact states that she is losing weight and feels that her leg swelling is improving. She is able to angulate and her sats did not drop below 94%. I have given her dose of IV Lasix in the emergency department. At this time I do not feel she needs admission for IV diuresis and she agrees with this plan. She is currently on Lasix 60 mg in the morning and 20 mg at night. Have advised her to increase her Lasix to 60 mg in  the morning and 40 mg at night and follow-up with her cardiologist. She is comfortable with this plan.   Patient's head CT shows no acute abdomen with and she still neurologically intact. Describes her headache is very mild. Have offered her pain medication which he declines. I have very low suspicion for infectious etiology or intracranial hemorrhage. I have recommended close outpatient follow-up with neurology for continued headaches. Patient and niece at bedside are comfortable with this plan as well.   Patient's blood pressure continues to be very labile. It'll be in the 200s/70s and then 5 minutes later will be in  the 110s/60s. Because of this I do not for comfortable changing her blood pressure medication at all. I discussed that we could potentially lower her blood pressure too much which would also be very dangerous for her. It appears on previous emergency department visits this is been the same. Her family thinks that her high blood pressure may be causing some of her headaches but she continued to have a headache while her blood pressure was 119/61. I feel that that is unlikely. I do not feel she needs to be admitted for blood pressure control as this again appears to be something chronic for her but have recommended she keep a close log of her blood pressure and discuss this with her PCP and endocrinologist for recommendations to see if they would like to change anything about her management. I have discussed with her that if her blood pressure were to stay very elevated at home and she was symptomatic that she should come back to the hospital for evaluation.   Patient's blood glucose has improved. Has been stable. She's been able to eat and drink without difficulty. I have also recommended she keep a close log of her blood sugar at home.   Urine shows hemoglobin and protein which is chronic and many bacteria but also many squamous cells. I suspect this is a dirty catch. No other sign of  infectious etiology.   I feel patient is safe to go home and have discussed with her and her niece at bedside at length. They both feel comfortable with this plan and would prefer discharge. She has not had a recent syncopal event or chest pain currently. She is only mildly short of breath when she lies flat and again is not tachypneic, hypoxic or has any increased work of breathing. She feels comfortable with the plan to try outpatient diuresis and knows that if this does not improve she should come back to the hospital for IV diuresis. Her head CT does not show anything acute that makes me concerned that would like her to follow-up with a neurologist as an outpatient. I have again recommend close outpatient follow-up with her PCP, cardiologist and endocrinologist for her multiple chronic issues.    EKG Interpretation  Date/Time:  Wednesday August 04 2015 01:18:41 EST Ventricular Rate:  66 PR Interval:  151 QRS Duration: 89 QT Interval:  472 QTC Calculation: 495 R Axis:   47 Text Interpretation:  Sinus rhythm Minimal ST depression, anterolateral leads Borderline prolonged QT interval No significant change since last tracing Confirmed by Yona Stansbury,  DO, [place] Pracht [phone]) on 08/04/2015 1:46:26 AM        [place], DO 08/04/15 [id number]

## 2015-08-04 NOTE — Discharge Instructions (Signed)
I recommend you watch your blood sugar closely and take your insulin as prescribed. You should keep a log of your blood sugar in the morning when you wake up in 2 hours after you eat. You should keep a log of this is that your doctor can decide if you need any adjustments in your diabetes regimen.   I also recommend you keep a log of your blood pressure. I recommend that you check your blood pressure twice a day. You should also present this long to your primary care physician to decide if there should be any adjustment in your blood pressure medications as her blood pressures appeared to be very labile. Please take your blood pressure medications as prescribed.   You also appear to have increasing fluid on your lungs but no increased need for oxygen. I recommend you increase your Lasix from 60 mg in the morning and 20 mg at night to 60 mg in the morning and 40 mg at night for the next week. Please follow-up with your cardiologist. If you develop chest pain, worsening shortness of breath, lower extremity swelling or weight gain despite this increased regimen of diuretics, please return to the hospital.  As for your headaches, I recommend close follow-up with an outpatient neurologist. We have given you information for two neurologists in [place] and one neurologist in [place]. Please call and schedule an appointment. Your neurologic exam today was normal. Your head CT was also normal.    Blood Glucose Monitoring, Adult Monitoring your blood glucose (also know as blood sugar) helps you to manage your diabetes. It also helps you and your health care provider monitor your diabetes and determine how well your treatment plan is working. WHY SHOULD YOU MONITOR YOUR BLOOD GLUCOSE?  It can help you understand how food, exercise, and medicine affect your blood glucose.  It allows you to know what your blood glucose is at any given moment. You can quickly tell if you are having low blood glucose  (hypoglycemia) or high blood glucose (hyperglycemia).  It can help you and your health care provider know how to adjust your medicines.  It can help you understand how to manage an illness or adjust medicine for exercise. WHEN SHOULD YOU TEST? Your health care provider will help you decide how often you should check your blood glucose. This may depend on the type of diabetes you have, your diabetes control, or the types of medicines you are taking. Be sure to write down all of your blood glucose readings so that this information can be reviewed with your health care provider. See below for examples of testing times that your health care provider may suggest. Type 1 Diabetes  Test at least 2 times per day if your diabetes is well controlled, if you are using an insulin pump, or if you perform multiple daily injections.  If your diabetes is not well controlled or if you are sick, you may need to test more often.  It is a good idea to also test:  Before every insulin injection.  Before and after exercise.  Between meals and 2 hours after a meal.  Occasionally between 2:00 a.m. and 3:00 a.m. Type 2 Diabetes  If you are taking insulin, test at least 2 times per day. However, it is best to test before every insulin injection.  If you take medicines by mouth (orally), test 2 times a day.  If you are on a controlled diet, test once a day.  If your diabetes  is not well controlled or if you are sick, you may need to monitor more often. HOW TO MONITOR YOUR BLOOD GLUCOSE Supplies Needed  Blood glucose meter.  Test strips for your meter. Each meter has its own strips. You must use the strips that go with your own meter.  A pricking needle (lancet).  A device that holds the lancet (lancing device).  A journal or log book to write down your results. Procedure  Wash your hands with soap and water. Alcohol is not preferred.  Prick the side of your finger (not the tip) with the  lancet.  Gently milk the finger until a small drop of blood appears.  Follow the instructions that come with your meter for inserting the test strip, applying blood to the strip, and using your blood glucose meter. Other Areas to Get Blood for Testing Some meters allow you to use other areas of your body (other than your finger) to test your blood. These areas are called alternative sites. The most common alternative sites are:  The forearm.  The thigh.  The back area of the lower leg.  The palm of the hand. The blood flow in these areas is slower. Therefore, the blood glucose values you get may be delayed, and the numbers are different from what you would get from your fingers. Do not use alternative sites if you think you are having hypoglycemia. Your reading will not be accurate. Always use a finger if you are having hypoglycemia. Also, if you cannot feel your lows (hypoglycemia unawareness), always use your fingers for your blood glucose checks. ADDITIONAL TIPS FOR GLUCOSE MONITORING  Do not reuse lancets.  Always carry your supplies with you.  All blood glucose meters have a 24-hour "hotline" number to call if you have questions or need help.  Adjust (calibrate) your blood glucose meter with a control solution after finishing a few boxes of strips. BLOOD GLUCOSE RECORD KEEPING It is a good idea to keep a daily record or log of your blood glucose readings. Most glucose meters, if not all, keep your glucose records stored in the meter. Some meters come with the ability to download your records to your home computer. Keeping a record of your blood glucose readings is especially helpful if you are wanting to look for patterns. Make notes to go along with the blood glucose readings because you might forget what happened at that exact time. Keeping good records helps you and your health care provider to work together to achieve good diabetes management.    This information is not intended  to replace advice given to you by your health care provider. Make sure you discuss any questions you have with your health care provider.   Document Released: 06/01/2003 Document Revised: 06/19/2014 Document Reviewed: 10/21/2012 Elsevier Interactive Patient Education 2016 [employer]  Hypoglycemia Hypoglycemia occurs when the glucose in your blood is too low. Glucose is a type of sugar that is your body's main energy source. Hormones, such as insulin and glucagon, control the level of glucose in the blood. Insulin lowers blood glucose and glucagon increases blood glucose. Having too much insulin in your blood stream, or not eating enough food containing sugar, can result in hypoglycemia. Hypoglycemia can happen to people with or without diabetes. It can develop quickly and can be a medical emergency.  CAUSES   Missing or delaying meals.  Not eating enough carbohydrates at meals.  Taking too much diabetes medicine.  Not timing your oral diabetes medicine  or insulin doses with meals, snacks, and exercise.  Nausea and vomiting.  Certain medicines.  Severe illnesses, such as hepatitis, kidney disorders, and certain eating disorders.  Increased activity or exercise without eating something extra or adjusting medicines.  Drinking too much alcohol.  A nerve disorder that affects body functions like your heart rate, blood pressure, and digestion (autonomic neuropathy).  A condition where the stomach muscles do not function properly (gastroparesis). Therefore, medicines and food may not absorb properly.  Rarely, a tumor of the pancreas can produce too much insulin. SYMPTOMS   Hunger.  Sweating (diaphoresis).  Change in body temperature.  Shakiness.  Headache.  Anxiety.  Lightheadedness.  Irritability.  Difficulty concentrating.  Dry mouth.  Tingling or numbness in the hands or feet.  Restless sleep or sleep disturbances.  Altered speech and coordination.  Change  in mental status.  Seizures or prolonged convulsions.  Combativeness.  Drowsiness (lethargic).  Weakness.  Increased heart rate or palpitations.  Confusion.  Pale, gray skin color.  Blurred or double vision.  Fainting. DIAGNOSIS  A physical exam and medical history will be performed. Your caregiver may make a diagnosis based on your symptoms. Blood tests and other lab tests may be performed to confirm a diagnosis. Once the diagnosis is made, your caregiver will see if your signs and symptoms go away once your blood glucose is raised.  TREATMENT  Usually, you can easily treat your hypoglycemia when you notice symptoms.  Check your blood glucose. If it is less than 70 mg/dl, take one of the following:   3-4 glucose tablets.    cup juice.    cup regular soda.   1 cup skim milk.   -1 tube of glucose gel.   5-6 hard candies.   Avoid high-fat drinks or food that may delay a rise in blood glucose levels.  Do not take more than the recommended amount of sugary foods, drinks, gel, or tablets. Doing so will cause your blood glucose to go too high.   Wait 10-15 minutes and recheck your blood glucose. If it is still less than 70 mg/dl or below your target range, repeat treatment.   Eat a snack if it is more than 1 hour until your next meal.  There may be a time when your blood glucose may go so low that you are unable to treat yourself at home when you start to notice symptoms. You may need someone to help you. You may even faint or be unable to swallow. If you cannot treat yourself, someone will need to bring you to the hospital.  [place]  If you have diabetes, follow your diabetes management plan by:  Taking your medicines as directed.  Following your exercise plan.  Following your meal plan. Do not skip meals. Eat on time.  Testing your blood glucose regularly. Check your blood glucose before and after exercise. If you exercise longer or  different than usual, be sure to check blood glucose more frequently.  Wearing your medical alert jewelry that says you have diabetes.  Identify the cause of your hypoglycemia. Then, develop ways to prevent the recurrence of hypoglycemia.  Do not take a hot bath or shower right after an insulin shot.  Always carry treatment with you. Glucose tablets are the easiest to carry.  If you are going to drink alcohol, drink it only with meals.  Tell friends or family members ways to keep you safe during a seizure. This may include removing hard or  sharp objects from the area or turning you on your side.  Maintain a healthy weight. SEEK MEDICAL CARE IF:   You are having problems keeping your blood glucose in your target range.  You are having frequent episodes of hypoglycemia.  You feel you might be having side effects from your medicines.  You are not sure why your blood glucose is dropping so low.  You notice a change in vision or a new problem with your vision. SEEK IMMEDIATE MEDICAL CARE IF:   Confusion develops.  A change in mental status occurs.  The inability to swallow develops.  Fainting occurs.   This information is not intended to replace advice given to you by your health care provider. Make sure you discuss any questions you have with your health care provider.   Document Released: 05/29/2005 Document Revised: 06/03/2013 Document Reviewed: 02/02/2015 Elsevier Interactive Patient Education 2016 [employer].  Hypertension Hypertension, commonly called high blood pressure, is when the force of blood pumping through your arteries is too strong. Your arteries are the blood vessels that carry blood from your heart throughout your body. A blood pressure reading consists of a higher number over a lower number, such as 110/72. The higher number (systolic) is the pressure inside your arteries when your heart pumps. The lower number (diastolic) is the pressure inside your  arteries when your heart relaxes. Ideally you want your blood pressure below 120/80. Hypertension forces your heart to work harder to pump blood. Your arteries may become narrow or stiff. Having untreated or uncontrolled hypertension can cause heart attack, stroke, kidney disease, and other problems. RISK FACTORS Some risk factors for high blood pressure are controllable. Others are not.  Risk factors you cannot control include:   Race. You may be at higher risk if you are African American.  Age. Risk increases with age.  Gender. Men are at higher risk than women before age 65 years. After age 78, women are at higher risk than men. Risk factors you can control include:  Not getting enough exercise or physical activity.  Being overweight.  Getting too much fat, sugar, calories, or salt in your diet.  Drinking too much alcohol. SIGNS AND SYMPTOMS Hypertension does not usually cause signs or symptoms. Extremely high blood pressure (hypertensive crisis) may cause headache, anxiety, shortness of breath, and nosebleed. DIAGNOSIS To check if you have hypertension, your health care provider will measure your blood pressure while you are seated, with your arm held at the level of your heart. It should be measured at least twice using the same arm. Certain conditions can cause a difference in blood pressure between your right and left arms. A blood pressure reading that is higher than normal on one occasion does not mean that you need treatment. If it is not clear whether you have high blood pressure, you may be asked to return on a different day to have your blood pressure checked again. Or, you may be asked to monitor your blood pressure at home for 1 or more weeks. TREATMENT Treating high blood pressure includes making lifestyle changes and possibly taking medicine. Living a healthy lifestyle can help lower high blood pressure. You may need to change some of your habits. Lifestyle changes may  include:  Following the DASH diet. This diet is high in fruits, vegetables, and whole grains. It is low in salt, red meat, and added sugars.  Keep your sodium intake below 2,300 mg per day.  Getting at least 30-45 minutes of aerobic  exercise at least 4 times per week.  Losing weight if necessary.  Not smoking.  Limiting alcoholic beverages.  Learning ways to reduce stress. Your health care provider may prescribe medicine if lifestyle changes are not enough to get your blood pressure under control, and if one of the following is true:  You are 60-15 years of age and your systolic blood pressure is above 140.  You are 40 years of age or older, and your systolic blood pressure is above 150.  Your diastolic blood pressure is above 90.  You have diabetes, and your systolic blood pressure is over [id number] or your diastolic blood pressure is over 90.  You have kidney disease and your blood pressure is above 140/90.  You have heart disease and your blood pressure is above 140/90. Your personal target blood pressure may vary depending on your medical conditions, your age, and other factors. HOME CARE INSTRUCTIONS  Have your blood pressure rechecked as directed by your health care provider.   Take medicines only as directed by your health care provider. Follow the directions carefully. Blood pressure medicines must be taken as prescribed. The medicine does not work as well when you skip doses. Skipping doses also puts you at risk for problems.  Do not smoke.   Monitor your blood pressure at home as directed by your health care provider. SEEK MEDICAL CARE IF:   You think you are having a reaction to medicines taken.  You have recurrent headaches or feel dizzy.  You have swelling in your ankles.  You have trouble with your vision. SEEK IMMEDIATE MEDICAL CARE IF:  You develop a severe headache or confusion.  You have unusual weakness, numbness, or feel faint.  You have severe  chest or abdominal pain.  You vomit repeatedly.  You have trouble breathing. MAKE SURE YOU:   Understand these instructions.  Will watch your condition.  Will get help right away if you are not doing well or get worse.   This information is not intended to replace advice given to you by your health care provider. Make sure you discuss any questions you have with your health care provider.   Document Released: 05/29/2005 Document Revised: 10/13/2014 Document Reviewed: 03/21/2013 Elsevier Interactive Patient Education 2016 [employer].  How to Take Your Blood Pressure HOW DO I GET A BLOOD PRESSURE MACHINE?  You can buy an electronic home blood pressure machine at your local pharmacy. Insurance will sometimes cover the cost if you have a prescription.  Ask your doctor what type of machine is best for you. There are different machines for your arm and your wrist.  If you decide to buy a machine to check your blood pressure on your arm, first check the size of your arm so you can buy the right size cuff. To check the size of your arm:   Use a measuring tape that shows both inches and centimeters.   Wrap the measuring tape around the upper-middle part of your arm. You may need someone to help you measure.   Write down your arm measurement in both inches and centimeters.   To measure your blood pressure correctly, it is important to have the right size cuff.   If your arm is up to 13 inches (up to 34 centimeters), get an adult cuff size.  If your arm is 13 to 17 inches (35 to 44 centimeters), get a large adult cuff size.    If your arm is 17 to 20 inches (45  to 52 centimeters), get an adult thigh cuff.  WHAT DO THE NUMBERS MEAN?   There are two numbers that make up your blood pressure. For example: 120/80.  The first number (120 in our example) is called the "systolic pressure." It is a measure of the pressure in your blood vessels when your heart is pumping  blood.  The second number (80 in our example) is called the "diastolic pressure." It is a measure of the pressure in your blood vessels when your heart is resting between beats.  Your doctor will tell you what your blood pressure should be. WHAT SHOULD I DO BEFORE I CHECK MY BLOOD PRESSURE?   Try to rest or relax for at least 30 minutes before you check your blood pressure.  Do not smoke.  Do not have any drinks with caffeine, such as:  Soda.  Coffee.  Tea.  Check your blood pressure in a quiet room.  Sit down and stretch out your arm on a table. Keep your arm at about the level of your heart. Let your arm relax.  Make sure that your legs are not crossed. HOW DO I CHECK MY BLOOD PRESSURE?  Follow the directions that came with your machine.  Make sure you remove any tight-fitting clothing from your arm or wrist. Wrap the cuff around your upper arm or wrist. You should be able to fit a finger between the cuff and your arm. If you cannot fit a finger between the cuff and your arm, it is too tight and should be removed and rewrapped.  Some units require you to manually pump up the arm cuff.  Automatic units inflate the cuff when you press a button.  Cuff deflation is automatic in both models.  After the cuff is inflated, the unit measures your blood pressure and pulse. The readings are shown on a monitor. Hold still and breathe normally while the cuff is inflated.  Getting a reading takes less than a minute.  Some models store readings in a memory. Some provide a printout of readings. If your machine does not store your readings, keep a written record.  Take readings with you to your next visit with your doctor.   This information is not intended to replace advice given to you by your health care provider. Make sure you discuss any questions you have with your health care provider.   Document Released: 05/11/2008 Document Revised: 06/19/2014 Document Reviewed:  07/24/2013 Elsevier Interactive Patient Education 2016 [place] Headache Without Cause A headache is pain or discomfort felt around the head or neck area. The specific cause of a headache may not be found. There are many causes and types of headaches. A few common ones are:  Tension headaches.  Migraine headaches.  Cluster headaches.  Chronic daily headaches. HOME CARE INSTRUCTIONS  Watch your condition for any changes. Take these steps to help with your condition: Managing Pain  Take over-the-counter and prescription medicines only as told by your health care provider.  Lie down in a dark, quiet room when you have a headache.  If directed, apply ice to the head and neck area:  Put ice in a plastic bag.  Place a towel between your skin and the bag.  Leave the ice on for 20 minutes, 2-3 times per day.  Use a heating pad or hot shower to apply heat to the head and neck area as told by your health care provider.  Keep lights dim if bright lights bother  you or make your headaches worse. Eating and Drinking  Eat meals on a regular schedule.  Limit alcohol use.  Decrease the amount of caffeine you drink, or stop drinking caffeine. General Instructions  Keep all follow-up visits as told by your health care provider. This is important.  Keep a headache journal to help find out what may trigger your headaches. For example, write down:  What you eat and drink.  How much sleep you get.  Any change to your diet or medicines.  Try massage or other relaxation techniques.  Limit stress.  Sit up straight, and do not tense your muscles.  Do not use tobacco products, including cigarettes, chewing tobacco, or e-cigarettes. If you need help quitting, ask your health care provider.  Exercise regularly as told by your health care provider.  Sleep on a regular schedule. Get 7-9 hours of sleep, or the amount recommended by your health care provider. SEEK MEDICAL  CARE IF:   Your symptoms are not helped by medicine.  You have a headache that is different from the usual headache.  You have nausea or you vomit.  You have a fever. SEEK IMMEDIATE MEDICAL CARE IF:   Your headache becomes severe.  You have repeated vomiting.  You have a stiff neck.  You have a loss of vision.  You have problems with speech.  You have pain in the eye or ear.  You have muscular weakness or loss of muscle control.  You lose your balance or have trouble walking.  You feel faint or pass out.  You have confusion.   This information is not intended to replace advice given to you by your health care provider. Make sure you discuss any questions you have with your health care provider.   Document Released: 05/29/2005 Document Revised: 02/17/2015 Document Reviewed: 09/21/2014 Elsevier Interactive Patient Education 2016 Else[employer] Inc.  Pulmonary Edema Pulmonary edema (PE) is a condition in which fluid collects in the lungs. This makes it hard to breathe. PE may be a result of the heart not pumping very well or a result of injury.  CAUSES   Coronary artery disease causes blockages in the arteries of the heart. This deprives the heart muscle of oxygen and weakens the muscle. A heart attack is a form of coronary artery disease.  High blood pressure causes the heart muscle to work harder than usual. Over time, the heart muscle may get stiff, and it starts to work less efficiently. It may also fatigue and weaken.  Viral infection of the heart (myocarditis) may weaken the heart muscle.  Metabolic conditions such as thyroid disease, excessive alcohol use, certain vitamin deficiencies, or diabetes may also weaken the heart muscle.  Leaky or stiff heart valves may impair normal heart function.  Lung disease may strain the heart muscle.  Excessive demands on the heart such as too much salt or fluid intake.  Failure to take prescribed medicines.  Lung injury from  heat or toxins, such as poisonous gas.  Infection in the lungs or other parts of the body.  Fluid overload caused by kidney failure or medicines. SYMPTOMS   Shortness of breath at rest or with exertion.  Grunting, wheezing, or gurgling while breathing.  Feeling like you cannot get enough air.  Breaths are shallow and fast.  A lot of coughing with frothy or bloody mucus.  Skin may become cool, damp, and turn a pale or bluish color. DIAGNOSIS  Initial diagnosis may be based on your history, symptoms, and  a physical examination. Additional tests for PE may include:  Electrocardiography.  Chest X-ray.  Blood tests.  Stress test.  Ultrasound evaluation of the heart (echocardiography).  Evaluation by a heart doctor (cardiologist).  Test of the heart arteries to look for blockages (angiography).  Check of blood oxygen. TREATMENT  Treatment of PE will depend on the underlying cause and will focus first on relieving the symptoms.   Extra oxygen to make breathing easier and assist with removing mucus. This may include breathing treatments or a tube into the lungs and a breathing machine.  Medicine to help the body get rid of extra water, usually through an IV tube.  Medicine to help the heart pump better.  If poor heart function is the cause, treatment may include:  Procedures to open blocked arteries, repair damaged heart valves, or remove some of the damaged heart muscle.  A pacemaker to help the heart pump with less effort. HOME CARE INSTRUCTIONS   Your health care provider will help you determine what type of exercise program may be helpful. It is important to maintain strength and increase it if possible. Pace your activities to avoid shortness of breath or chest pain. Rest for at least 1 hour before and after meals. Cardiac rehabilitation programs are available in some locations.  Eat a heart-healthy diet low in salt, saturated fat, and cholesterol. Ask for help with  choices.  Make a list of every medicine, vitamin, or herbal supplement you are taking. Keep the list with you at all times. Show it to your health care provider at every visit and before starting a new medicine. Keep the list up to date.  Ask your health care provider or pharmacist to help you write a plan or schedule so that you know things about each medicine such as:  Why you are taking it.  The possible side effects.  The best time of day to take it.  Foods to take with it or avoid.  When to stop taking it.  Record your hospital or clinic weight. When you get home, compare it to your scale and record your weight. Then, weigh yourself first thing in the morning daily, and record the weights. You should weigh yourself every morning after you urinate and before you eat breakfast. Wear the same amount of clothing each time you weigh yourself. Provide your health care provider with your weight record. Daily weights are important in the early recognition of excess fluid. Tell your health care provider right away if you have gained 03 lb/1.4 kg in 1 day, 05 lb/2.3 kg in a week, or as directed by your health care provider. Your medicines may need to be adjusted.  Blood pressure monitoring should be done as often as directed. You can get a home blood pressure cuff at your drugstore. Record these values and bring them with you for your clinic visits. Notify your health care provider if you become dizzy or light-headed when standing up.  If you are currently a smoker, it is time to quit. Nicotine makes your heart work harder and is one of the leading causes of cardiac deaths. Do not use nicotine gum or patches before talking to your doctor.  Make a follow-up appointment with your health care provider as directed.  Ask your health care provider for a copy of your latest heart tracing (ECG) and keep a copy with you at all times. SEEK IMMEDIATE MEDICAL CARE IF:   You have severe chest pain,  especially if the  pain is crushing or pressure-like and spreads to the arms, back, neck, or jaw. THIS IS AN EMERGENCY. Do not wait to see if the pain will go away. Call for local emergency medical help. Do not drive yourself to the hospital.  You have sweating, feel sick to your stomach (nauseous), or are experiencing shortness of breath.  Your weight increases by 03 lb/1.4 kg in 1 day or 05 lb/2.3 kg in a week.  You notice increasing shortness of breath that is unusual for you. This may happen during rest, sleep, or with activity.  You develop chest pain (angina) or pain that is unusual for you.  You notice more swelling in your hands, feet, ankles, or abdomen.  You notice lasting (persistent) dizziness, blurred vision, headache, or unsteadiness.  You begin to cough up bloody mucus (sputum).  You are unable to sleep because it is hard to breathe.  You begin to feel a "jumping" or "fluttering" sensation (palpitations) in the chest that is unusual for you. MAKE SURE YOU:  Understand these instructions.  Will watch your condition.  Will get help right away if you are not doing well or get worse.   This information is not intended to replace advice given to you by your health care provider. Make sure you discuss any questions you have with your health care provider.   Document Released: 08/19/2002 Document Revised: 06/03/2013 Document Reviewed: 02/03/2013 Elsevier Interactive Patient Education [employer].

## 2015-08-04 NOTE — Telephone Encounter (Signed)
She was to have a BMP.   Should she have any follow up based on change made in ED?

## 2015-08-04 NOTE — Telephone Encounter (Signed)
Pt was to have labs tomorrow (ordered at last office visit). She was in ED last night and lab work done.  Lasix increased in ED to 60 mg every AM and 40 mg every PM.   Will forward to Dr. Angelena Form to review labs and to see if any follow up labs needed.

## 2015-08-04 NOTE — ED Notes (Signed)
Per ems pt's blood sugar was 43. Amp of d50 was given and blood sugar was then 173.

## 2015-08-04 NOTE — Telephone Encounter (Signed)
I would repeat in 10 days if she has had change in Lasix. cdm

## 2015-08-04 NOTE — Telephone Encounter (Signed)
Renal function stable. Was the lab tomorrow a BMET? If so, no need to repeat. cdm

## 2015-08-05 ENCOUNTER — Other Ambulatory Visit: Payer: Medicare Other

## 2015-08-05 DIAGNOSIS — F329 Major depressive disorder, single episode, unspecified: Secondary | ICD-10-CM | POA: Diagnosis not present

## 2015-08-05 DIAGNOSIS — E119 Type 2 diabetes mellitus without complications: Secondary | ICD-10-CM | POA: Diagnosis not present

## 2015-08-05 DIAGNOSIS — M1990 Unspecified osteoarthritis, unspecified site: Secondary | ICD-10-CM | POA: Diagnosis not present

## 2015-08-05 DIAGNOSIS — I10 Essential (primary) hypertension: Secondary | ICD-10-CM | POA: Diagnosis not present

## 2015-08-05 DIAGNOSIS — M6281 Muscle weakness (generalized): Secondary | ICD-10-CM | POA: Diagnosis not present

## 2015-08-05 DIAGNOSIS — L89620 Pressure ulcer of left heel, unstageable: Secondary | ICD-10-CM | POA: Diagnosis not present

## 2015-08-11 ENCOUNTER — Encounter (HOSPITAL_COMMUNITY): Payer: Self-pay

## 2015-08-11 ENCOUNTER — Emergency Department (HOSPITAL_COMMUNITY)
Admission: EM | Admit: 2015-08-11 | Discharge: 2015-08-11 | Disposition: A | Discharge status: Home / Self Care | Payer: Medicare Other | Attending: Emergency Medicine | Admitting: Emergency Medicine

## 2015-08-11 ENCOUNTER — Ambulatory Visit: Payer: Medicare Other | Admitting: Adult Health

## 2015-08-11 ENCOUNTER — Emergency Department (HOSPITAL_COMMUNITY): Payer: Medicare Other

## 2015-08-11 DIAGNOSIS — Z9049 Acquired absence of other specified parts of digestive tract: Secondary | ICD-10-CM | POA: Insufficient documentation

## 2015-08-11 DIAGNOSIS — Z792 Long term (current) use of antibiotics: Secondary | ICD-10-CM | POA: Diagnosis not present

## 2015-08-11 DIAGNOSIS — R739 Hyperglycemia, unspecified: Secondary | ICD-10-CM

## 2015-08-11 DIAGNOSIS — Z79899 Other long term (current) drug therapy: Secondary | ICD-10-CM | POA: Insufficient documentation

## 2015-08-11 DIAGNOSIS — I159 Secondary hypertension, unspecified: Secondary | ICD-10-CM | POA: Diagnosis not present

## 2015-08-11 DIAGNOSIS — E785 Hyperlipidemia, unspecified: Secondary | ICD-10-CM | POA: Insufficient documentation

## 2015-08-11 DIAGNOSIS — I110 Hypertensive heart disease with heart failure: Secondary | ICD-10-CM | POA: Insufficient documentation

## 2015-08-11 DIAGNOSIS — E1165 Type 2 diabetes mellitus with hyperglycemia: Secondary | ICD-10-CM | POA: Diagnosis not present

## 2015-08-11 DIAGNOSIS — I509 Heart failure, unspecified: Secondary | ICD-10-CM | POA: Diagnosis not present

## 2015-08-11 DIAGNOSIS — E039 Hypothyroidism, unspecified: Secondary | ICD-10-CM | POA: Diagnosis not present

## 2015-08-11 DIAGNOSIS — R51 Headache: Secondary | ICD-10-CM | POA: Diagnosis not present

## 2015-08-11 DIAGNOSIS — I10 Essential (primary) hypertension: Secondary | ICD-10-CM | POA: Diagnosis not present

## 2015-08-11 HISTORY — DX: Headache: R51

## 2015-08-11 HISTORY — DX: Headache, unspecified: R51.9

## 2015-08-11 HISTORY — DX: Heart failure, unspecified: I50.9

## 2015-08-11 LAB — COMPREHENSIVE METABOLIC PANEL
ALBUMIN: 4.3 g/dL (ref 3.5–5.0)
ALT: 12 U/L — AB (ref 14–54)
AST: 20 U/L (ref 15–41)
Alkaline Phosphatase: 143 U/L — ABNORMAL HIGH (ref 38–126)
Anion gap: 16 — ABNORMAL HIGH (ref 5–15)
BUN: 23 mg/dL — AB (ref 6–20)
CHLORIDE: 93 mmol/L — AB (ref 101–111)
CO2: 25 mmol/L (ref 22–32)
CREATININE: 1 mg/dL (ref 0.44–1.00)
Calcium: 9 mg/dL (ref 8.9–10.3)
GFR calc Af Amer: >60 mL/min (ref >60)
GFR, EST NON AFRICAN AMERICAN: 56 mL/min — AB (ref >60)
GLUCOSE: 405 mg/dL — AB (ref 65–99)
Potassium: 4.1 mmol/L (ref 3.5–5.1)
Sodium: 134 mmol/L — ABNORMAL LOW (ref 135–145)
Total Bilirubin: 1.6 mg/dL — ABNORMAL HIGH (ref 0.3–1.2)
Total Protein: 7.6 g/dL (ref 6.5–8.1)

## 2015-08-11 LAB — CBC WITH DIFFERENTIAL/PLATELET
BASOS ABS: 0 10*3/uL (ref 0.0–0.1)
Basophils Relative: 0 %
EOS PCT: 0 %
Eosinophils Absolute: 0 10*3/uL (ref 0.0–0.7)
HCT: 40.6 % (ref 36.0–46.0)
Hemoglobin: 13.7 g/dL (ref 12.0–15.0)
LYMPHS PCT: 10 %
Lymphs Abs: 1.1 10*3/uL (ref 0.7–4.0)
MCH: 27 pg (ref 26.0–34.0)
MCHC: 33.7 g/dL (ref 30.0–36.0)
MCV: 80.1 fL (ref 78.0–100.0)
Monocytes Absolute: 0.6 10*3/uL (ref 0.1–1.0)
Monocytes Relative: 5 %
NEUTROS ABS: 9.9 10*3/uL — AB (ref 1.7–7.7)
Neutrophils Relative %: 85 %
PLATELETS: 286 10*3/uL (ref 150–400)
RBC: 5.07 MIL/uL (ref 3.87–5.11)
RDW: 15.3 % (ref 11.5–15.5)
WBC: 11.6 10*3/uL — AB (ref 4.0–10.5)

## 2015-08-11 LAB — CBG MONITORING, ED
GLUCOSE-CAPILLARY: 370 mg/dL — AB (ref 65–99)
GLUCOSE-CAPILLARY: 382 mg/dL — AB (ref 65–99)
Glucose-Capillary: 435 mg/dL — ABNORMAL HIGH (ref 65–99)

## 2015-08-11 MED ORDER — KETOROLAC TROMETHAMINE 30 MG/ML IJ SOLN
15.0000 mg | Freq: Once | INTRAMUSCULAR | Status: AC
  Administered 2015-08-11: 15 mg via INTRAVENOUS
  Filled 2015-08-11: qty 1

## 2015-08-11 MED ORDER — SODIUM CHLORIDE 0.9 % IV BOLUS (SEPSIS)
1000.0000 mL | Freq: Once | INTRAVENOUS | Status: AC
  Administered 2015-08-11: 1000 mL via INTRAVENOUS

## 2015-08-11 MED ORDER — ONDANSETRON HCL 4 MG/2ML IJ SOLN
4.0000 mg | Freq: Once | INTRAMUSCULAR | Status: AC
  Administered 2015-08-11: 4 mg via INTRAVENOUS
  Filled 2015-08-11: qty 2

## 2015-08-11 MED ORDER — FUROSEMIDE 20 MG PO TABS: 20.0000 mg | ORAL_TABLET | Freq: Two times a day (BID) | ORAL | Status: DC

## 2015-08-11 MED ORDER — MAGNESIUM SULFATE 2 GM/50ML IV SOLN
2.0000 g | Freq: Once | INTRAVENOUS | Status: AC
  Administered 2015-08-11: 2 g via INTRAVENOUS
  Filled 2015-08-11: qty 50

## 2015-08-11 MED ORDER — INSULIN ASPART 100 UNIT/ML ~~LOC~~ SOLN
6.0000 [IU] | Freq: Once | SUBCUTANEOUS | Status: AC
  Administered 2015-08-11: 6 [IU] via SUBCUTANEOUS
  Filled 2015-08-11: qty 1

## 2015-08-11 NOTE — ED Provider Notes (Signed)
CSN: [id number]     Arrival date & time 08/11/15  1447 History   First MD Initiated Contact with Patient 08/11/15 1813     Chief Complaint  Patient presents with  . Hypertension     (Consider location/radiation/quality/duration/timing/severity/associated sxs/prior Treatment) Patient is a 70 y.o. female presenting with headaches. The history is provided by the patient.  Headache Pain location:  Generalized Severity currently:  7/10 Severity at highest:  7/10 Onset quality:  Gradual Duration:  6 weeks Timing:  Constant Progression:  Worsening Chronicity:  Chronic Similar to prior headaches: yes   Relieved by:  None tried Ineffective treatments:  Acetaminophen Associated symptoms: nausea, syncope and vomiting   Associated symptoms: no abdominal pain, no back pain, no cough, no eye pain, no fever, no neck pain, no neck stiffness and no URI     Past Medical History  Diagnosis Date  . Diabetes mellitus   . Hypertension   . Arthritis   . Shingles   . hypothyroidism   . Bronchitis   . Hyperlipidemia   . GERD (gastroesophageal reflux disease)   . PONV (postoperative nausea and vomiting)   . Headache   . CHF (congestive heart failure) [facility])    Past Surgical History  Procedure Laterality Date  . Cholecystectomy    . Ankle surgery      x 4  . Non cancerous mass removed      small intestine  . Tonsillectomy    . Nasal sinus surgery    . Cataract extraction Bilateral   . Appendectomy    . Knee arthroscopy    . Orif calcaneous fracture Left 12/29/2014    Procedure: OPEN REDUCTION INTERNAL FIXATION  LEFT CALCANEOUS FRACTURE;  Surgeon: Gaynelle Arabian, MD;  Location: [facility];  Service: Orthopedics;  Laterality: Left;   Family History  Problem Relation Age of Onset  . Diabetes Mother   . Asthma Mother   . Hypertension Father   . Heart disease Father   . Diabetes Father   . Heart disease Sister   . Hypertension Sister   . Asthma Sister   . Diabetes Brother   . CAD Brother    . Stroke Brother    Social History  Substance Use Topics  . Smoking status: Never Smoker   . Smokeless tobacco: Never Used  . Alcohol Use: No   OB History    No data available     Review of Systems  Constitutional: Negative for fever.  Eyes: Negative for pain.  Respiratory: Negative for cough.   Cardiovascular: Positive for syncope. Negative for chest pain.  Gastrointestinal: Positive for nausea and vomiting. Negative for abdominal pain.  Musculoskeletal: Negative for back pain, joint swelling, neck pain and neck stiffness.  Neurological: Positive for headaches.  All other systems reviewed and are negative.     Allergies  Sulfonamide derivatives; Bactrim; Erythromycin; Mucinex; Aspirin; Levofloxacin; and Tape  Home Medications   Prior to Admission medications   Medication Sig Start Date End Date Taking? Authorizing Provider  acetaminophen (TYLENOL) 500 MG tablet Take 500-1,000 mg by mouth every 6 (six) hours as needed for headache. Reported on 07/17/2015   Yes Historical Provider, MD  acetaminophen-codeine (TYLENOL #3) 300-30 MG tablet Take 1 tablet by mouth every 4 (four) hours as needed for moderate pain.   Yes Historical Provider, MD  albuterol (PROVENTIL HFA;VENTOLIN HFA) 108 (90 BASE) MCG/ACT inhaler Inhale 2 puffs into the lungs every 4 (four) hours as needed for wheezing or shortness of breath.  Yes Historical Provider, MD  carisoprodol (SOMA) 350 MG tablet Take 175-350 mg by mouth 3 (three) times daily as needed for muscle spasms. For cramps   Yes Historical Provider, MD  cefUROXime (CEFTIN) 250 MG tablet Take 250 mg by mouth 2 (two) times daily with a meal. 10 day course starting on 08/02/15   Yes Historical Provider, MD  esomeprazole (NEXIUM) 20 MG capsule Take 20 mg by mouth daily at 12 noon.   Yes Historical Provider, MD  fluticasone (FLONASE) 50 MCG/ACT nasal spray Place 2 sprays into the nose 2 (two) times daily as needed for allergies.  10/28/14  Yes Historical  Provider, MD  Homeopathic Products ([facility] [place]) CREA Apply 1 application topically daily.   Yes Historical Provider, MD  insulin regular human CONCENTRATED (HUMULIN R) 500 UNIT/ML SOLN injection Inject 60-150 Units into the skin 3 (three) times daily before meals. Sliding scale range is reported 60-150 units based on a sliding scale. She takes it 30 minutes before meals.   Yes Historical Provider, MD  levothyroxine (SYNTHROID, LEVOTHROID) 125 MCG tablet Take 125-187.5 mcg by mouth daily. Takes 125 mcg daily. On Sundays she takes 187.5 mcg   Yes Historical Provider, MD  meclizine (ANTIVERT) 25 MG tablet Take 25 mg by mouth 2 (two) times daily.   Yes Historical Provider, MD  nebivolol (BYSTOLIC) 10 MG tablet Take 10 mg by mouth daily.   Yes Historical Provider, MD  ondansetron (ZOFRAN) 4 MG tablet Take 1 tablet (4 mg total) by mouth every 6 (six) hours as needed for nausea. 01/01/15  Yes Arlee Muslim, PA-C  PARoxetine (PAXIL) 40 MG tablet Take 40 mg by mouth daily.   Yes Historical Provider, MD  Polyethylene Glycol 400 (BLINK TEARS OP) Place 1 drop into both eyes 4 (four) times daily as needed (Dry eyes).    Yes Historical Provider, MD  potassium chloride (K-DUR) 10 MEQ tablet Take 1 tablet (10 mEq total) by mouth daily. 05/04/15  Yes Rigoberto Noel, MD  quinapril (ACCUPRIL) 40 MG tablet Take 40 mg by mouth at bedtime.   Yes Historical Provider, MD  Senna-Psyllium (PERDIEM PO) Take 1 tablet by mouth daily as needed (constipation). Reported on 07/17/2015   Yes Historical Provider, MD  simvastatin (ZOCOR) 20 MG tablet Take 20 mg by mouth at bedtime. Reported on 07/17/2015   Yes Historical Provider, MD  furosemide (LASIX) 20 MG tablet Take 1 tablet (20 mg total) by mouth 2 (two) times daily. 08/11/15   Corene Cornea Tyrese Capriotti, MD   BP 161/65 mmHg  Pulse 96  Temp(Src) 98.2 F (36.8 C) (Oral)  Resp 14  Ht 5\' 8"  (1.727 m)  Wt 196 lb 2 oz (88.962 kg)  BMI 29.83 kg/m2  SpO2 94% Physical Exam  Constitutional: She  appears well-developed and well-nourished.  HENT:  Head: Normocephalic and atraumatic.  Neck: Normal range of motion.  Cardiovascular: Normal rate and regular rhythm.   Pulmonary/Chest: No stridor. No respiratory distress.  Abdominal: She exhibits no distension.  Neurological: She is alert.  No altered mental status, able to give full seemingly accurate history.  Face is symmetric, EOM's intact, pupils equal and reactive, vision intact, tongue and uvula midline without deviation Upper and Lower extremity motor 5/5, intact pain perception in distal extremities, 2+ reflexes in biceps, patella and achilles tendons. Finger to nose normal, heel to shin normal.   Nursing note and vitals reviewed.   ED Course  Procedures (including critical care time) Labs Review Labs Reviewed  CBC WITH DIFFERENTIAL/PLATELET -  Abnormal; Notable for the following:    WBC 11.6 (*)    Neutro Abs 9.9 (*)    All other components within normal limits  COMPREHENSIVE METABOLIC PANEL - Abnormal; Notable for the following:    Sodium 134 (*)    Chloride 93 (*)    Glucose, Bld 405 (*)    BUN 23 (*)    ALT 12 (*)    Alkaline Phosphatase 143 (*)    Total Bilirubin 1.6 (*)    GFR calc non Af Amer 56 (*)    Anion gap 16 (*)    All other components within normal limits  CBG MONITORING, ED - Abnormal; Notable for the following:    Glucose-Capillary 370 (*)    All other components within normal limits  CBG MONITORING, ED - Abnormal; Notable for the following:    Glucose-Capillary 435 (*)    All other components within normal limits  CBG MONITORING, ED - Abnormal; Notable for the following:    Glucose-Capillary 382 (*)    All other components within normal limits    Imaging Review Ct Head Wo Contrast  08/11/2015  CLINICAL DATA:  70 year old female with history of headache for the past 2 weeks. Elevated blood pressure. Vomiting. EXAM: CT HEAD WITHOUT CONTRAST TECHNIQUE: Contiguous axial images were obtained from the  base of the skull through the vertex without intravenous contrast. COMPARISON:  Head CT 08/04/2015. FINDINGS: Mild cerebral atrophy. Patchy and confluent areas of decreased attenuation are noted throughout the deep and periventricular white matter of the cerebral hemispheres bilaterally, compatible with chronic microvascular ischemic disease. No acute intracranial abnormalities. Specifically, no evidence of acute intracranial hemorrhage, no definite findings of acute/subacute cerebral ischemia, no mass, mass effect, hydrocephalus or abnormal intra or extra-axial fluid collections. Visualized paranasal sinuses and mastoids are well pneumatized, with exception of some mild multifocal mucosal thickening throughout the ethmoid sinuses. No acute displaced skull fractures are identified. IMPRESSION: 1. No acute intracranial abnormalities. 2. Mild cerebral atrophy with mild chronic microvascular ischemic changes in cerebral white matter. Electronically Signed   By: Vinnie Langton M.D.   On: 08/11/2015 20:43   I have personally reviewed and evaluated these images and lab results as part of my medical decision-making.   EKG Interpretation   Date/Time:  Wednesday August 11 2015 18:27:03 EST Ventricular Rate:  91 PR Interval:  143 QRS Duration: 89 QT Interval:  413 QTC Calculation: 508 R Axis:   42 Text Interpretation:  Sinus rhythm Prolonged QT interval Similar to  08/04/15 Confirmed by [facility] MD, Corene Cornea [phone]) on 08/11/2015 6:46:12 PM      MDM   Final diagnoses:  Secondary hypertension, unspecified  Hyperglycemia   HA and HTN, intermittent vomiting. Hyperglycemic here. Neuro ok. Will eval and treat for ha. Likely tension. QT of 507 so will start with magnesium and toradol.  Magnesium and Toradol completely resolved patient's headache and subsequently her blood pressures improved. Private ischemia and worsened initially however after taking her home dose of U5 100 insulin and improved. Symptoms totally  improved on the emergency department repeat neurologic exam still intact doubt any emergent causes for her headache or other symptoms. We'll discharge with PCP follow-up.    Merrily Pew, MD 08/11/15 [phone]

## 2015-08-11 NOTE — ED Notes (Signed)
Blood pressure today was 213/103, my home health nurse said i needed to come to the ER.  I have been vomiting for the past two days.

## 2015-08-11 NOTE — ED Notes (Signed)
Patient states that her MD is aware of her high blood pressure.

## 2015-08-11 NOTE — ED Notes (Signed)
MD at bedside. 

## 2015-08-13 ENCOUNTER — Other Ambulatory Visit: Payer: Medicare Other

## 2015-08-13 ENCOUNTER — Encounter (HOSPITAL_COMMUNITY): Payer: Self-pay

## 2015-08-13 ENCOUNTER — Emergency Department (HOSPITAL_COMMUNITY): Payer: Medicare Other

## 2015-08-13 ENCOUNTER — Observation Stay (HOSPITAL_COMMUNITY): Payer: Medicare Other

## 2015-08-13 ENCOUNTER — Observation Stay (HOSPITAL_COMMUNITY)
Admission: EM | Admit: 2015-08-13 | Discharge: 2015-08-14 | Disposition: A | Discharge status: Home / Self Care | Payer: Medicare Other | Attending: Family Medicine | Admitting: Family Medicine

## 2015-08-13 DIAGNOSIS — R55 Syncope and collapse: Secondary | ICD-10-CM | POA: Diagnosis not present

## 2015-08-13 DIAGNOSIS — T68XXXA Hypothermia, initial encounter: Secondary | ICD-10-CM | POA: Diagnosis not present

## 2015-08-13 DIAGNOSIS — S51811A Laceration without foreign body of right forearm, initial encounter: Secondary | ICD-10-CM | POA: Insufficient documentation

## 2015-08-13 DIAGNOSIS — Y92009 Unspecified place in unspecified non-institutional (private) residence as the place of occurrence of the external cause: Secondary | ICD-10-CM

## 2015-08-13 DIAGNOSIS — S0083XA Contusion of other part of head, initial encounter: Secondary | ICD-10-CM | POA: Diagnosis not present

## 2015-08-13 DIAGNOSIS — Y999 Unspecified external cause status: Secondary | ICD-10-CM | POA: Diagnosis not present

## 2015-08-13 DIAGNOSIS — R918 Other nonspecific abnormal finding of lung field: Secondary | ICD-10-CM | POA: Diagnosis not present

## 2015-08-13 DIAGNOSIS — S0990XA Unspecified injury of head, initial encounter: Secondary | ICD-10-CM | POA: Diagnosis not present

## 2015-08-13 DIAGNOSIS — M1990 Unspecified osteoarthritis, unspecified site: Secondary | ICD-10-CM | POA: Insufficient documentation

## 2015-08-13 DIAGNOSIS — Y92003 Bedroom of unspecified non-institutional (private) residence as the place of occurrence of the external cause: Secondary | ICD-10-CM | POA: Diagnosis not present

## 2015-08-13 DIAGNOSIS — I110 Hypertensive heart disease with heart failure: Secondary | ICD-10-CM | POA: Diagnosis not present

## 2015-08-13 DIAGNOSIS — I5032 Chronic diastolic (congestive) heart failure: Secondary | ICD-10-CM | POA: Diagnosis not present

## 2015-08-13 DIAGNOSIS — E11649 Type 2 diabetes mellitus with hypoglycemia without coma: Secondary | ICD-10-CM | POA: Diagnosis not present

## 2015-08-13 DIAGNOSIS — E162 Hypoglycemia, unspecified: Secondary | ICD-10-CM

## 2015-08-13 DIAGNOSIS — S0993XA Unspecified injury of face, initial encounter: Secondary | ICD-10-CM | POA: Diagnosis not present

## 2015-08-13 DIAGNOSIS — W1830XA Fall on same level, unspecified, initial encounter: Secondary | ICD-10-CM | POA: Diagnosis not present

## 2015-08-13 DIAGNOSIS — E1159 Type 2 diabetes mellitus with other circulatory complications: Secondary | ICD-10-CM | POA: Diagnosis present

## 2015-08-13 DIAGNOSIS — Z794 Long term (current) use of insulin: Secondary | ICD-10-CM | POA: Diagnosis not present

## 2015-08-13 DIAGNOSIS — E1165 Type 2 diabetes mellitus with hyperglycemia: Secondary | ICD-10-CM | POA: Diagnosis present

## 2015-08-13 DIAGNOSIS — I10 Essential (primary) hypertension: Secondary | ICD-10-CM

## 2015-08-13 DIAGNOSIS — Z79899 Other long term (current) drug therapy: Secondary | ICD-10-CM | POA: Insufficient documentation

## 2015-08-13 DIAGNOSIS — W19XXXA Unspecified fall, initial encounter: Secondary | ICD-10-CM | POA: Diagnosis not present

## 2015-08-13 DIAGNOSIS — N390 Urinary tract infection, site not specified: Secondary | ICD-10-CM | POA: Diagnosis present

## 2015-08-13 DIAGNOSIS — S51819A Laceration without foreign body of unspecified forearm, initial encounter: Secondary | ICD-10-CM | POA: Insufficient documentation

## 2015-08-13 DIAGNOSIS — S0093XA Contusion of unspecified part of head, initial encounter: Secondary | ICD-10-CM | POA: Diagnosis not present

## 2015-08-13 DIAGNOSIS — E161 Other hypoglycemia: Secondary | ICD-10-CM | POA: Diagnosis not present

## 2015-08-13 DIAGNOSIS — E039 Hypothyroidism, unspecified: Secondary | ICD-10-CM | POA: Diagnosis not present

## 2015-08-13 DIAGNOSIS — Y939 Activity, unspecified: Secondary | ICD-10-CM | POA: Insufficient documentation

## 2015-08-13 DIAGNOSIS — R030 Elevated blood-pressure reading, without diagnosis of hypertension: Secondary | ICD-10-CM | POA: Diagnosis not present

## 2015-08-13 HISTORY — DX: Chronic diastolic (congestive) heart failure: I50.32

## 2015-08-13 HISTORY — DX: Hypothyroidism, unspecified: E03.9

## 2015-08-13 HISTORY — DX: Syncope and collapse: R55

## 2015-08-13 HISTORY — DX: Dizziness and giddiness: R42

## 2015-08-13 HISTORY — DX: Type 2 diabetes mellitus with ketoacidosis without coma: E11.10

## 2015-08-13 LAB — CBC WITH DIFFERENTIAL/PLATELET
BASOS ABS: 0 10*3/uL (ref 0.0–0.1)
Basophils Relative: 0 %
EOS ABS: 0 10*3/uL (ref 0.0–0.7)
Eosinophils Relative: 0 %
HCT: 33 % — ABNORMAL LOW (ref 36.0–46.0)
HEMOGLOBIN: 11.2 g/dL — AB (ref 12.0–15.0)
LYMPHS ABS: 0.4 10*3/uL — AB (ref 0.7–4.0)
Lymphocytes Relative: 5 %
MCH: 27.2 pg (ref 26.0–34.0)
MCHC: 33.9 g/dL (ref 30.0–36.0)
MCV: 80.1 fL (ref 78.0–100.0)
Monocytes Absolute: 0.4 10*3/uL (ref 0.1–1.0)
Monocytes Relative: 5 %
NEUTROS PCT: 90 %
Neutro Abs: 6.3 10*3/uL (ref 1.7–7.7)
Platelets: 187 10*3/uL (ref 150–400)
RBC: 4.12 MIL/uL (ref 3.87–5.11)
RDW: 15.1 % (ref 11.5–15.5)
WBC: 7 10*3/uL (ref 4.0–10.5)

## 2015-08-13 LAB — COMPREHENSIVE METABOLIC PANEL
ALT: 11 U/L — AB (ref 14–54)
AST: 20 U/L (ref 15–41)
Albumin: 3.6 g/dL (ref 3.5–5.0)
Alkaline Phosphatase: 114 U/L (ref 38–126)
Anion gap: 8 (ref 5–15)
BUN: 28 mg/dL — ABNORMAL HIGH (ref 6–20)
CHLORIDE: 102 mmol/L (ref 101–111)
CO2: 28 mmol/L (ref 22–32)
CREATININE: 0.97 mg/dL (ref 0.44–1.00)
Calcium: 8.6 mg/dL — ABNORMAL LOW (ref 8.9–10.3)
GFR, EST NON AFRICAN AMERICAN: 58 mL/min — AB (ref >60)
Glucose, Bld: 56 mg/dL — ABNORMAL LOW (ref 65–99)
POTASSIUM: 3.6 mmol/L (ref 3.5–5.1)
Sodium: 138 mmol/L (ref 135–145)
TOTAL PROTEIN: 6.2 g/dL — AB (ref 6.5–8.1)
Total Bilirubin: 0.3 mg/dL (ref 0.3–1.2)

## 2015-08-13 LAB — CBG MONITORING, ED
GLUCOSE-CAPILLARY: 50 mg/dL — AB (ref 65–99)
GLUCOSE-CAPILLARY: 144 mg/dL — AB (ref 65–99)
GLUCOSE-CAPILLARY: 119 mg/dL — AB (ref 65–99)
GLUCOSE-CAPILLARY: 102 mg/dL — AB (ref 65–99)
GLUCOSE-CAPILLARY: 54 mg/dL — AB (ref 65–99)
Glucose-Capillary: 106 mg/dL — ABNORMAL HIGH (ref 65–99)
Glucose-Capillary: 90 mg/dL (ref 65–99)
Glucose-Capillary: 69 mg/dL (ref 65–99)
Glucose-Capillary: 93 mg/dL (ref 65–99)

## 2015-08-13 LAB — URINALYSIS, ROUTINE W REFLEX MICROSCOPIC
BILIRUBIN URINE: NEGATIVE
GLUCOSE, UA: 100 mg/dL — AB
KETONES UR: NEGATIVE mg/dL
Nitrite: NEGATIVE
PROTEIN: 100 mg/dL — AB
Specific Gravity, Urine: >1.03 — ABNORMAL HIGH (ref 1.005–1.030)
pH: 5 (ref 5.0–8.0)

## 2015-08-13 LAB — URINE MICROSCOPIC-ADD ON: Squamous Epithelial / LPF: NONE SEEN

## 2015-08-13 LAB — GLUCOSE, CAPILLARY: Glucose-Capillary: 378 mg/dL — ABNORMAL HIGH (ref 65–99)

## 2015-08-13 LAB — CK: CK TOTAL: 43 U/L (ref 38–234)

## 2015-08-13 LAB — TSH: TSH: 2.151 u[IU]/mL (ref 0.350–4.500)

## 2015-08-13 MED ORDER — LEVOTHYROXINE SODIUM 75 MCG PO TABS: 187.5000 ug | ORAL_TABLET | ORAL | Status: DC

## 2015-08-13 MED ORDER — HYDRALAZINE HCL 20 MG/ML IJ SOLN
10.0000 mg | Freq: Once | INTRAMUSCULAR | Status: AC
  Administered 2015-08-13: 10 mg via INTRAVENOUS
  Filled 2015-08-13: qty 1

## 2015-08-13 MED ORDER — ONDANSETRON HCL 4 MG/2ML IJ SOLN: 4.0000 mg | Freq: Four times a day (QID) | INTRAMUSCULAR | Status: DC | PRN

## 2015-08-13 MED ORDER — SIMVASTATIN 20 MG PO TABS
20.0000 mg | ORAL_TABLET | Freq: Every day | ORAL | Status: DC
Start: 2015-08-13 — End: 2015-08-14
  Administered 2015-08-13: 20 mg via ORAL
  Filled 2015-08-13: qty 1

## 2015-08-13 MED ORDER — ACETAMINOPHEN 325 MG PO TABS
650.0000 mg | ORAL_TABLET | Freq: Four times a day (QID) | ORAL | Status: DC | PRN
  Filled 2015-08-13: qty 2

## 2015-08-13 MED ORDER — POTASSIUM CHLORIDE ER 10 MEQ PO TBCR
10.0000 meq | EXTENDED_RELEASE_TABLET | Freq: Two times a day (BID) | ORAL | Status: DC
  Administered 2015-08-13 – 2015-08-14 (×3): 10 meq via ORAL
  Filled 2015-08-13 (×9): qty 1

## 2015-08-13 MED ORDER — PANTOPRAZOLE SODIUM 40 MG PO TBEC
40.0000 mg | DELAYED_RELEASE_TABLET | Freq: Every day | ORAL | Status: DC
  Administered 2015-08-13 – 2015-08-14 (×2): 40 mg via ORAL
  Filled 2015-08-13 (×2): qty 1

## 2015-08-13 MED ORDER — ACETAMINOPHEN 650 MG RE SUPP: 650.0000 mg | Freq: Four times a day (QID) | RECTAL | Status: DC | PRN

## 2015-08-13 MED ORDER — DEXTROSE 50 % IV SOLN
INTRAVENOUS | Status: AC
Start: 2015-08-13 — End: 2015-08-13
  Filled 2015-08-13: qty 50

## 2015-08-13 MED ORDER — HYDRALAZINE HCL 20 MG/ML IJ SOLN
10.0000 mg | INTRAMUSCULAR | Status: DC | PRN
  Administered 2015-08-13 – 2015-08-14 (×2): 10 mg via INTRAVENOUS
  Filled 2015-08-13 (×2): qty 1

## 2015-08-13 MED ORDER — CARISOPRODOL 350 MG PO TABS: 175.0000 mg | ORAL_TABLET | Freq: Three times a day (TID) | ORAL | Status: DC | PRN

## 2015-08-13 MED ORDER — LISINOPRIL 10 MG PO TABS
40.0000 mg | ORAL_TABLET | Freq: Every day | ORAL | Status: DC
  Administered 2015-08-13: 40 mg via ORAL
  Filled 2015-08-13: qty 4

## 2015-08-13 MED ORDER — NEBIVOLOL HCL 10 MG PO TABS
10.0000 mg | ORAL_TABLET | Freq: Every day | ORAL | Status: DC
  Administered 2015-08-13: 10 mg via ORAL
  Filled 2015-08-13 (×4): qty 1

## 2015-08-13 MED ORDER — FLUTICASONE PROPIONATE 50 MCG/ACT NA SUSP
2.0000 | Freq: Every day | NASAL | Status: DC
Start: 2015-08-13 — End: 2015-08-14
  Administered 2015-08-13 – 2015-08-14 (×2): 2 via NASAL
  Filled 2015-08-13: qty 16

## 2015-08-13 MED ORDER — POTASSIUM CHLORIDE ER 10 MEQ PO TBCR: 10.0000 meq | EXTENDED_RELEASE_TABLET | Freq: Every day | ORAL | Status: DC

## 2015-08-13 MED ORDER — FUROSEMIDE 40 MG PO TABS
40.0000 mg | ORAL_TABLET | Freq: Two times a day (BID) | ORAL | Status: DC
  Administered 2015-08-13: 40 mg via ORAL
  Filled 2015-08-13: qty 1

## 2015-08-13 MED ORDER — HYDROCODONE-ACETAMINOPHEN 5-325 MG PO TABS
1.0000 | ORAL_TABLET | ORAL | Status: DC | PRN
  Administered 2015-08-13: 2 via ORAL
  Administered 2015-08-13: 1 via ORAL
  Filled 2015-08-13: qty 2
  Filled 2015-08-13: qty 1

## 2015-08-13 MED ORDER — ALBUTEROL SULFATE (2.5 MG/3ML) 0.083% IN NEBU: 2.5000 mg | INHALATION_SOLUTION | RESPIRATORY_TRACT | Status: DC | PRN

## 2015-08-13 MED ORDER — PAROXETINE HCL 20 MG PO TABS
40.0000 mg | ORAL_TABLET | Freq: Every day | ORAL | Status: DC
  Administered 2015-08-13 – 2015-08-14 (×2): 40 mg via ORAL
  Filled 2015-08-13 (×5): qty 2

## 2015-08-13 MED ORDER — POLYVINYL ALCOHOL 1.4 % OP SOLN
Freq: Four times a day (QID) | OPHTHALMIC | Status: DC | PRN
  Filled 2015-08-13: qty 15

## 2015-08-13 MED ORDER — DEXTROSE 50 % IV SOLN
1.0000 | Freq: Once | INTRAVENOUS | Status: AC
  Administered 2015-08-13: 50 mL via INTRAVENOUS

## 2015-08-13 MED ORDER — DEXTROSE 10 % IV SOLN: INTRAVENOUS | Status: DC

## 2015-08-13 MED ORDER — LEVOTHYROXINE SODIUM 50 MCG PO TABS
125.0000 ug | ORAL_TABLET | Freq: Every day | ORAL | Status: DC
  Filled 2015-08-13: qty 3

## 2015-08-13 MED ORDER — LEVOTHYROXINE SODIUM 25 MCG PO TABS
125.0000 ug | ORAL_TABLET | ORAL | Status: DC
  Administered 2015-08-14: 125 ug via ORAL
  Filled 2015-08-13: qty 1

## 2015-08-13 MED ORDER — ALBUTEROL SULFATE HFA 108 (90 BASE) MCG/ACT IN AERS
2.0000 | INHALATION_SPRAY | RESPIRATORY_TRACT | Status: DC | PRN
  Filled 2015-08-13: qty 6.7

## 2015-08-13 MED ORDER — MECLIZINE HCL 12.5 MG PO TABS: 25.0000 mg | ORAL_TABLET | Freq: Two times a day (BID) | ORAL | Status: DC | PRN

## 2015-08-13 MED ORDER — ALUM & MAG HYDROXIDE-SIMETH 200-200-20 MG/5ML PO SUSP: 30.0000 mL | Freq: Four times a day (QID) | ORAL | Status: DC | PRN

## 2015-08-13 MED ORDER — ONDANSETRON HCL 4 MG PO TABS: 4.0000 mg | ORAL_TABLET | Freq: Four times a day (QID) | ORAL | Status: DC | PRN

## 2015-08-13 MED ORDER — DEXTROSE 5 % IV SOLN
1.0000 g | INTRAVENOUS | Status: DC
  Administered 2015-08-13: 1 g via INTRAVENOUS
  Filled 2015-08-13 (×3): qty 10

## 2015-08-13 MED ORDER — POTASSIUM CL IN DEXTROSE 5% 20 MEQ/L IV SOLN
20.0000 meq | INTRAVENOUS | Status: DC
  Administered 2015-08-13 (×2): 20 meq via INTRAVENOUS
  Filled 2015-08-13 (×6): qty 1000

## 2015-08-13 MED ORDER — DOCUSATE SODIUM 100 MG PO CAPS
100.0000 mg | ORAL_CAPSULE | Freq: Two times a day (BID) | ORAL | Status: DC
  Administered 2015-08-13 – 2015-08-14 (×3): 100 mg via ORAL
  Filled 2015-08-13 (×8): qty 1

## 2015-08-13 NOTE — ED Provider Notes (Addendum)
CSN: [id number]     Arrival date & time 08/13/15  0425 History   First MD Initiated Contact with Patient 08/13/15 [phone]     Chief Complaint  Patient presents with  . Hypoglycemia  . Fall     (Consider location/radiation/quality/duration/timing/severity/associated sxs/prior Treatment) HPI patient states she was seen in the ED about 10 days ago when she became "totally crazy". At that time she was hypertensive and she was given medication to lower her blood pressure. She was seen again in the ER on March 1 when she again had high blood pressure. She reported she had been having headaches for several weeks. She contributes the headaches to her hypertension. She had a CT scan done on March 1 that did not show any acute changes. She states she ate as usual yesterday, March 2. She states she felt fine except she did have 2 episodes of diarrhea prior to bedtime. She denies nausea, vomiting, coughing, or fever. She states she woke up on the floor in her bedroom and doesn't know how she got there. She states it took her about an hour for her to struggle to reach a phone to call 911. She complains of a headache and denies any neck pain. She denies any other injury from the fall. Tetanus is up-to-date  PCP Dr Willey Blade Endocrinology Dr Baldwin Crown  Past Medical History  Diagnosis Date  . Diabetes mellitus   . Hypertension   . Arthritis   . Shingles   . hypothyroidism   . Bronchitis   . Hyperlipidemia   . GERD (gastroesophageal reflux disease)   . PONV (postoperative nausea and vomiting)   . Headache   . CHF (congestive heart failure) ([place])   . DKA (diabetic ketoacidoses) ([place]) 09/27/2014  . Chronic diastolic heart failure ([place]) 07/13/2015  . Hypothyroid 10/31/2014  . Vertigo 10/31/2014  . Syncope 10/31/2014   Past Surgical History  Procedure Laterality Date  . Cholecystectomy    . Ankle surgery      x 4  . Non cancerous mass removed      small intestine  . Tonsillectomy    . Nasal sinus surgery      . Cataract extraction Bilateral   . Appendectomy    . Knee arthroscopy    . Orif calcaneous fracture Left 12/29/2014    Procedure: OPEN REDUCTION INTERNAL FIXATION  LEFT CALCANEOUS FRACTURE;  Surgeon: Gaynelle Arabian, MD;  Location: [facility];  Service: Orthopedics;  Laterality: Left;   Family History  Problem Relation Age of Onset  . Diabetes Mother   . Asthma Mother   . Hypertension Father   . Heart disease Father   . Diabetes Father   . Heart disease Sister   . Hypertension Sister   . Asthma Sister   . Diabetes Brother   . CAD Brother   . Stroke Brother    Social History  Substance Use Topics  . Smoking status: Never Smoker   . Smokeless tobacco: Never Used  . Alcohol Use: No   Lives at home Lives alone  OB History    No data available     Review of Systems  All other systems reviewed and are negative.     Allergies  Sulfonamide derivatives; Bactrim; Erythromycin; Mucinex; Aspirin; Levofloxacin; and Tape  Home Medications   Prior to Admission medications   Medication Sig Start Date End Date Taking? Authorizing Provider  acetaminophen (TYLENOL) 500 MG tablet Take 500-1,000 mg by mouth every 6 (six) hours as needed for  headache. Reported on 07/17/2015   Yes Historical Provider, MD  acetaminophen-codeine (TYLENOL #3) 300-30 MG tablet Take 1 tablet by mouth every 4 (four) hours as needed for moderate pain.   Yes Historical Provider, MD  albuterol (PROVENTIL HFA;VENTOLIN HFA) 108 (90 BASE) MCG/ACT inhaler Inhale 2 puffs into the lungs every 4 (four) hours as needed for wheezing or shortness of breath.   Yes Historical Provider, MD  carisoprodol (SOMA) 350 MG tablet Take 175-350 mg by mouth 3 (three) times daily as needed for muscle spasms. For cramps   Yes Historical Provider, MD  cefUROXime (CEFTIN) 250 MG tablet Take 250 mg by mouth 2 (two) times daily with a meal. 10 day course starting on 08/02/15   Yes Historical Provider, MD  esomeprazole (NEXIUM) 20 MG capsule Take 20  mg by mouth daily at 12 noon.   Yes Historical Provider, MD  fluticasone (FLONASE) 50 MCG/ACT nasal spray Place 2 sprays into the nose 2 (two) times daily as needed for allergies.  10/28/14  Yes Historical Provider, MD  furosemide (LASIX) 20 MG tablet Take 1 tablet (20 mg total) by mouth 2 (two) times daily. 08/11/15  Yes Merrily Pew, MD  Homeopathic Products (ARNICARE [place]) CREA Apply 1 application topically daily.   Yes Historical Provider, MD  insulin regular human CONCENTRATED (HUMULIN R) 500 UNIT/ML SOLN injection Inject 60-150 Units into the skin 3 (three) times daily before meals. Sliding scale range is reported 60-150 units based on a sliding scale. She takes it 30 minutes before meals.   Yes Historical Provider, MD  levothyroxine (SYNTHROID, LEVOTHROID) 125 MCG tablet Take 125-187.5 mcg by mouth daily. Takes 125 mcg daily. On Sundays she takes 187.5 mcg   Yes Historical Provider, MD  meclizine (ANTIVERT) 25 MG tablet Take 25 mg by mouth 2 (two) times daily.   Yes Historical Provider, MD  nebivolol (BYSTOLIC) 10 MG tablet Take 10 mg by mouth daily.   Yes Historical Provider, MD  ondansetron (ZOFRAN) 4 MG tablet Take 1 tablet (4 mg total) by mouth every 6 (six) hours as needed for nausea. 01/01/15  Yes Arlee Muslim, PA-C  PARoxetine (PAXIL) 40 MG tablet Take 40 mg by mouth daily.   Yes Historical Provider, MD  Polyethylene Glycol 400 (BLINK TEARS OP) Place 1 drop into both eyes 4 (four) times daily as needed (Dry eyes).    Yes Historical Provider, MD  potassium chloride (K-DUR) 10 MEQ tablet Take 1 tablet (10 mEq total) by mouth daily. 05/04/15  Yes Rigoberto Noel, MD  quinapril (ACCUPRIL) 40 MG tablet Take 40 mg by mouth at bedtime.   Yes Historical Provider, MD  Senna-Psyllium (PERDIEM PO) Take 1 tablet by mouth daily as needed (constipation). Reported on 07/17/2015   Yes Historical Provider, MD  simvastatin (ZOCOR) 20 MG tablet Take 20 mg by mouth at bedtime. Reported on 07/17/2015   Yes Historical  Provider, MD   BP 174/72 mmHg  Pulse 73  Temp(Src) 93.7 F (34.3 C) (Rectal)  Resp 20  Ht 5\' 8"  (1.727 m)  Wt 195 lb (88.451 kg)  BMI 29.66 kg/m2  SpO2 100%  Vital signs normal except for hypertension  Physical Exam  Constitutional: She is oriented to person, place, and time. She appears well-developed and well-nourished.  Non-toxic appearance. She does not appear ill. No distress.  HENT:  Head: Normocephalic.  Right Ear: External ear normal.  Left Ear: External ear normal.  Nose: Nose normal. No mucosal edema or rhinorrhea.  Mouth/Throat: Oropharynx is clear  and moist and mucous membranes are normal. No dental abscesses or uvula swelling.  Patient has faint abrasions on her right lateral cheek and her right forehead where she fell and hit her head tonight. There are no lacerations. No trauma to her tongue  Eyes: Conjunctivae and EOM are normal. Pupils are equal, round, and reactive to light.  Neck: Normal range of motion and full passive range of motion without pain. Neck supple.  Cardiovascular: Normal rate, regular rhythm and normal heart sounds.  Exam reveals no gallop and no friction rub.   No murmur heard. Pulmonary/Chest: Effort normal and breath sounds normal. No respiratory distress. She has no wheezes. She has no rhonchi. She has no rales. She exhibits no tenderness and no crepitus.  Abdominal: Soft. Normal appearance and bowel sounds are normal. She exhibits no distension. There is no tenderness. There is no rebound and no guarding.  Musculoskeletal: Normal range of motion. She exhibits no edema or tenderness.  Moves all extremities well. Patient's noted that her right leg is larger than the left which she states is chronic. She states she had several fractures in that leg and it is always bigger than the other one.  Neurological: She is alert and oriented to person, place, and time. She has normal strength. No cranial nerve deficit.  Skin: Skin is warm, dry and intact. No  rash noted. No erythema. No pallor.  Patient has a large skin tear on the right forearm.  Psychiatric: She has a normal mood and affect. Her speech is normal and behavior is normal. Her mood appears not anxious.  Nursing note and vitals reviewed.        ED Course  Procedures (including critical care time)  Medications  dextrose 10 % infusion (not administered)  dextrose 50 % solution 50 mL (50 mLs Intravenous Given 08/13/15 0634)     Patient was noted to be hypothermic with her temperature being 93.7 rectally. She was placed on a warming blanket. Patient was given crackers and peanut butter to eat to maintain her CBG  Patient's temperature improved to 97.4 orally. However her repeat CBG shows her glucose has dropped to 56. Patient is awake and alert. She was able to drink orange juice and eat more crackers. She was given at the [facility] and started on D10. We discussed admission today to make sure her blood sugar gets stabilized. Her CK is normal so she does not have rhabdomyolysis. She walked to the bathroom to get a urine sample however she missed the cup so her urine has not been collected yet.  07:15 Dr Caryn Section, admit to tele, obs, Dr Willey Blade attending   Labs Review Results for orders placed or performed during the hospital encounter of 08/13/15  Comprehensive metabolic panel  Result Value Ref Range   Sodium 138 135 - 145 mmol/L   Potassium 3.6 3.5 - 5.1 mmol/L   Chloride 102 101 - 111 mmol/L   CO2 28 22 - 32 mmol/L   Glucose, Bld 56 (L) 65 - 99 mg/dL   BUN 28 (H) 6 - 20 mg/dL   Creatinine, Ser 0.97 0.44 - 1.00 mg/dL   Calcium 8.6 (L) 8.9 - 10.3 mg/dL   Total Protein 6.2 (L) 6.5 - 8.1 g/dL   Albumin 3.6 3.5 - 5.0 g/dL   AST 20 15 - 41 U/L   ALT 11 (L) 14 - 54 U/L   Alkaline Phosphatase 114 38 - 126 U/L   Total Bilirubin 0.3 0.3 - 1.2 mg/dL  GFR calc non Af Amer 58 (L) >60 mL/min   GFR calc Af Amer >60 >60 mL/min   Anion gap 8 5 - 15  CBC with Differential  Result Value Ref  Range   WBC 7.0 4.0 - 10.5 K/uL   RBC 4.12 3.87 - 5.11 MIL/uL   Hemoglobin 11.2 (L) 12.0 - 15.0 g/dL   HCT 33.0 (L) 36.0 - 46.0 %   MCV 80.1 78.0 - 100.0 fL   MCH 27.2 26.0 - 34.0 pg   MCHC 33.9 30.0 - 36.0 g/dL   RDW 15.1 11.5 - 15.5 %   Platelets 187 150 - 400 K/uL   Neutrophils Relative % 90 %   Neutro Abs 6.3 1.7 - 7.7 K/uL   Lymphocytes Relative 5 %   Lymphs Abs 0.4 (L) 0.7 - 4.0 K/uL   Monocytes Relative 5 %   Monocytes Absolute 0.4 0.1 - 1.0 K/uL   Eosinophils Relative 0 %   Eosinophils Absolute 0.0 0.0 - 0.7 K/uL   Basophils Relative 0 %   Basophils Absolute 0.0 0.0 - 0.1 K/uL  CK  Result Value Ref Range   Total CK 43 38 - 234 U/L  CBG monitoring, ED  Result Value Ref Range   Glucose-Capillary 106 (H) 65 - 99 mg/dL   Comment 1 Notify RN    Comment 2 Document in Chart   CBG monitoring, ED  Result Value Ref Range   Glucose-Capillary 50 (L) 65 - 99 mg/dL   Comment 1 Notify RN    Comment 2 Document in Chart   CBG monitoring, ED  Result Value Ref Range   Glucose-Capillary 144 (H) 65 - 99 mg/dL   Laboratory interpretation all normal except mild anemia     Imaging Review Ct Head Wo Contrast  Ct Maxillofacial Wo Cm  08/13/2015  CLINICAL DATA:  Felt weak after getting up from bed. Status post fall. Hit head on dresser. Concern for maxillofacial injury. Initial encounter. EXAM: CT HEAD WITHOUT CONTRAST CT MAXILLOFACIAL WITHOUT CONTRAST TECHNIQUE: Multidetector CT imaging of the head and maxillofacial structures were performed using the standard protocol without intravenous contrast. Multiplanar CT image reconstructions of the maxillofacial structures were also generated. COMPARISON:  CT of the head performed 08/11/2015, and CT of the maxillofacial structures performed 07/17/2015 FINDINGS: CT HEAD FINDINGS There is no evidence of acute infarction, mass lesion, or intra- or extra-axial hemorrhage on CT. Prominence of the sulci suggests mild cortical volume loss. Mild  periventricular white matter change likely reflects small vessel ischemic microangiopathy. Mild cerebellar atrophy is noted. A small chronic lacunar infarct is noted at the right basal ganglia. The brainstem and fourth ventricle are within normal limits. The cerebral hemispheres demonstrate grossly normal gray-white differentiation. No mass effect or midline shift is seen. There is no evidence of fracture; visualized osseous structures are unremarkable in appearance. The visualized portions of the orbits are within normal limits. The paranasal sinuses and mastoid air cells are well-aerated. No significant soft tissue abnormalities are seen. CT MAXILLOFACIAL FINDINGS There is no evidence of fracture or dislocation. The maxilla and mandible appear intact. The nasal bone is unremarkable in appearance. The visualized dentition demonstrates no acute abnormality. There is incomplete fusion of the posterior arch of C1. The orbits are intact bilaterally. The visualized paranasal sinuses and mastoid air cells are well-aerated. No significant soft tissue abnormalities are seen. The parapharyngeal fat planes are preserved. The nasopharynx, oropharynx and hypopharynx are unremarkable in appearance. The visualized portions of the valleculae and piriform  sinuses are grossly unremarkable. The parotid and submandibular glands are within normal limits. No cervical lymphadenopathy is seen. IMPRESSION: 1. No evidence of traumatic intracranial injury or fracture. 2. No evidence of fracture or dislocation with regard to the maxillofacial structures. 3. Mild cortical volume loss and scattered small vessel ischemic microangiopathy. 4. 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 Electronically Signed   By: Garald Balding M.D.   On: 08/13/2015 06:37   I have personally reviewed and evaluated these images and lab results as part of my medical decision-making.    MDM   Final diagnoses:  Fall at home, initial encounter    Hypoglycemia  Hypothermia, initial encounter  Skin tear of forearm without complication, right, initial encounter  Contusion of head, initial encounter   Plan admission   CRITICAL CARE Performed by: Rolland Porter L Total critical care time: 36  minutes Critical care time was exclusive of separately billable procedures and treating other patients. Critical care was necessary to treat or prevent imminent or life-threatening deterioration. Critical care was time spent personally by me on the following activities: development of treatment plan with patient and/or surrogate as well as nursing, discussions with consultants, evaluation of patient's response to treatment, examination of patient, obtaining history from patient or surrogate, ordering and performing treatments and interventions, ordering and review of laboratory studies, ordering and review of radiographic studies, pulse oximetry and re-evaluation of patient's condition.   Rolland Porter, MD, Barbette Or, MD 08/13/15 [place], MD 08/13/15 2308

## 2015-08-13 NOTE — Progress Notes (Signed)
Pt is refusing to wear Telemetry box because the stickers make her "break out". She is also refusing our Lasix pills as well.

## 2015-08-13 NOTE — ED Notes (Signed)
Pt got up from bed and felt weak, states she fell, hit her head on her dresser in the bedroom.  Ems found pt to have cbg of 43.

## 2015-08-13 NOTE — ED Notes (Signed)
Eating meal, animated and conversant, reports all of family is now dead and she is lonely

## 2015-08-13 NOTE — H&P (Signed)
[facility] History and Physical  Jaclyn Herrera [id number] DOB: July 15, 1945 DOA: 08/13/2015  Referring physician: ED physician, Rolland Porter PCP: Asencion Noble, MD   Chief Complaint: Patient passed out at home; low blood sugar  HPI: Jaclyn Herrera is a 70 y.o. female with a history of insulin-requiring diabetes mellitus-followed by Dr. Forde Dandy, chronic diastolic heart failure-followed by Dr. Julianne Handler, hypothyroidism, hypertension, and degenerative joint disease. She presents to the emergency department after passing out at home. Patient states that she was in her usual state of health yesterday. She ate dinner yesterday evening. Her blood sugar was 242. She gave herself 5 units of insulin. Prior to bedtime, her blood sugar was 242. During the middle of the night, she got up to use the bathroom. The next thing she knew, she was waking up on the floor. She did lose consciousness and believes that she was unconscious for a few minutes. She was able to reach her phone to call EMS. When EMS arrived, reportedly, her blood sugar was 43. Patient has a bruise on the right side of her head from the fall and reports a mild headache. She initially had some blurred vision from the low blood sugar, but her vision is now within normal limits. She feels weak all over, but denies focal left or right-sided weakness. She denies facial droop, difficulty swallowing, difficulty speaking, chest pain, shortness of breath, numbness, vomiting, or pain with urination. She did have one episode of nausea and loose stools. She has chronic swelling in her legs. Patient has a history of syncopal episodes in the past. Her PCP, Dr. Willey Blade referred her to cardiology and pulmonology, but apparently there was no apparent etiology. She does not recall being evaluated by neurology.  In the ED, she was initially hypothermic with a temperature of 93.7. It is currently 97.4 following the bare hugger. Her CBG was 106 initially, but fell to 56.  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 Maxillofacial CT reveals no evidence of fracture or dislocation. EKG wasn't done but will be ordered. She is being admitted for further evaluation and management.    Review of Systems:  As above in history present illness. In addition, she has chronic swelling in her legs, chronic pain in her ankles from previous fractures and surgery, occasional shortness of breath; review of systems otherwise negative.   Past Medical History  Diagnosis Date  . Diabetes mellitus   . Hypertension   . Arthritis   . Shingles   . hypothyroidism   . Bronchitis   . Hyperlipidemia   . GERD (gastroesophageal reflux disease)   . PONV (postoperative nausea and vomiting)   . Headache   . CHF (congestive heart failure) ([place])   . DKA (diabetic ketoacidoses) ([place]) 09/27/2014  . Chronic diastolic heart failure ([place]) 07/13/2015  . Hypothyroid 10/31/2014  . Vertigo 10/31/2014  . Syncope 10/31/2014   Past Surgical History  Procedure Laterality Date  . Cholecystectomy    . Ankle surgery      x 4  . Non cancerous mass removed      small intestine  . Tonsillectomy    . Nasal sinus surgery    . Cataract extraction Bilateral   . Appendectomy    . Knee arthroscopy    . Orif calcaneous fracture Left 12/29/2014    Procedure: OPEN REDUCTION INTERNAL FIXATION  LEFT CALCANEOUS FRACTURE;  Surgeon: Gaynelle Arabian, MD;  Location: [facility];  Service: Orthopedics;  Laterality: Left;   Social History: She is widowed. She has 2 stepchildren. She denies tobacco, alcohol, and illicit drug use. She still drives.   Allergies  Allergen Reactions  . Sulfonamide Derivatives Anaphylaxis  . Bactrim [Sulfamethoxazole-Trimethoprim] Nausea Only  . Erythromycin Nausea And Vomiting  . Mucinex [Guaifenesin Er] Nausea And Vomiting  . Aspirin Rash  . Levofloxacin Rash  . Tape Rash    Family History  Problem Relation Age of  Onset  . Diabetes Mother   . Asthma Mother   . Hypertension Father   . Heart disease Father   . Diabetes Father   . Heart disease Sister   . Hypertension Sister   . Asthma Sister   . Diabetes Brother   . CAD Brother   . Stroke Brother     Prior to Admission medications   Medication Sig Start Date End Date Taking? Authorizing Provider  acetaminophen (TYLENOL) 500 MG tablet Take 500-1,000 mg by mouth every 6 (six) hours as needed for headache. Reported on 07/17/2015   Yes Historical Provider, MD  acetaminophen-codeine (TYLENOL #3) 300-30 MG tablet Take 1 tablet by mouth every 4 (four) hours as needed for moderate pain.   Yes Historical Provider, MD  albuterol (PROVENTIL HFA;VENTOLIN HFA) 108 (90 BASE) MCG/ACT inhaler Inhale 2 puffs into the lungs every 4 (four) hours as needed for wheezing or shortness of breath.   Yes Historical Provider, MD  carisoprodol (SOMA) 350 MG tablet Take 175-350 mg by mouth 3 (three) times daily as needed for muscle spasms. For cramps   Yes Historical Provider, MD  cefUROXime (CEFTIN) 250 MG tablet Take 250 mg by mouth 2 (two) times daily with a meal. 10 day course starting on 08/02/15   Yes Historical Provider, MD  esomeprazole (NEXIUM) 20 MG capsule Take 20 mg by mouth daily at 12 noon.   Yes Historical Provider, MD  fluticasone (FLONASE) 50 MCG/ACT nasal spray Place 2 sprays into the nose 2 (two) times daily as needed for allergies.  10/28/14  Yes Historical Provider, MD  furosemide (LASIX) 20 MG tablet Take 1 tablet (20 mg total) by mouth 2 (two) times daily. 08/11/15  Yes Merrily Pew, MD  Homeopathic Products ([facility] [place]) CREA Apply 1 application topically daily.   Yes Historical Provider, MD  insulin regular human CONCENTRATED (HUMULIN R) 500 UNIT/ML SOLN injection Inject 60-150 Units into the skin 3 (three) times daily before meals. Sliding scale range is reported 60-150 units based on a sliding scale. She takes it 30 minutes before meals.   Yes Historical  Provider, MD  levothyroxine (SYNTHROID, LEVOTHROID) 125 MCG tablet Take 125-187.5 mcg by mouth daily. Takes 125 mcg daily. On Sundays she takes 187.5 mcg   Yes Historical Provider, MD  meclizine (ANTIVERT) 25 MG tablet Take 25 mg by mouth 2 (two) times daily.   Yes Historical Provider, MD  nebivolol (BYSTOLIC) 10 MG tablet Take 10 mg by mouth daily.   Yes Historical Provider, MD  ondansetron (ZOFRAN) 4 MG tablet Take 1 tablet (4 mg total) by mouth every 6 (six) hours as needed for nausea. 01/01/15  Yes Arlee Muslim, PA-C  PARoxetine (PAXIL) 40 MG tablet Take 40 mg by mouth daily.   Yes Historical Provider, MD  Polyethylene Glycol 400 (BLINK TEARS OP) Place 1 drop into both eyes 4 (four) times daily as needed (Dry eyes).    Yes Historical Provider, MD  potassium chloride (K-DUR) 10 MEQ tablet Take 1 tablet (10 mEq total) by mouth  daily. 05/04/15  Yes Rigoberto Noel, MD  quinapril (ACCUPRIL) 40 MG tablet Take 40 mg by mouth at bedtime.   Yes Historical Provider, MD  Senna-Psyllium (PERDIEM PO) Take 1 tablet by mouth daily as needed (constipation). Reported on 07/17/2015   Yes Historical Provider, MD  simvastatin (ZOCOR) 20 MG tablet Take 20 mg by mouth at bedtime. Reported on 07/17/2015   Yes Historical Provider, MD   Physical Exam: Filed Vitals:   08/13/15 0500 08/13/15 0559 08/13/15 0600 08/13/15 0630  BP: 188/78  171/67 170/71  Pulse: 71  71 67  Temp:  97.4 F (36.3 C)    TempSrc:  Oral    Resp:      Height:      Weight:      SpO2: 100%  100% 100%    Wt Readings from Last 3 Encounters:  08/13/15 88.451 kg (195 lb)  08/11/15 88.962 kg (196 lb 2 oz)  08/04/15 92.08 kg (203 lb)    General:  Appears calm and comfortable; pleasant 70 year old Caucasian woman who is alert and oriented and in no acute distress. Scalp: There is a mild reddened area on the right side of her for head and right upper cheek, nondraining and mildly tender. Eyes: PERRL, normal lids, irises & conjunctiva; conjunctivae  are clear and sclerae are white. ENT: grossly normal hearing; oropharynx because my brains are mildly dry. No posterior pharyngeal exudates or erythema. Neck: no LAD, masses or thyromegaly Cardiovascular: S1, S2, with soft systolic murmur. No LE edema. Telemetry: SR, no arrhythmias  Respiratory: Clear anteriorly with decreased breath sounds in the bases. Normal respiratory effort. Abdomen: soft, positive bowel sounds, soft, nontender, nondistended. Skin: 2 small areas of erythema on the right side of her for head and right upper cheek; chronic scars on both knees. Musculoskeletal: grossly normal tone BUE/BLE; no acute hot red joints. Psychiatric: grossly normal mood and affect, speech fluent and appropriate Neurologic: grossly non-focal; cranial nerves II through XII are intact. Handgrip is 5 over 5 bilaterally. The patient is able to lift each leg against gravity approximately 30. No facial droop. No dysarthria. Speech is clear and fluent.           Labs on Admission:  Basic Metabolic Panel:  Recent Labs Lab 08/11/15 1855 08/13/15 0532  NA 134* 138  K 4.1 3.6  CL 93* 102  CO2 25 28  GLUCOSE 405* 56*  BUN 23* 28*  CREATININE 1.00 0.97  CALCIUM 9.0 8.6*   Liver Function Tests:  Recent Labs Lab 08/11/15 1855 08/13/15 0532  AST 20 20  ALT 12* 11*  ALKPHOS 143* 114  BILITOT 1.6* 0.3  PROT 7.6 6.2*  ALBUMIN 4.3 3.6   No results for input(s): LIPASE, AMYLASE in the last 168 hours. No results for input(s): AMMONIA in the last 168 hours. CBC:  Recent Labs Lab 08/11/15 1855 08/13/15 0532  WBC 11.6* 7.0  NEUTROABS 9.9* 6.3  HGB 13.7 11.2*  HCT 40.6 33.0*  MCV 80.1 80.1  PLT 286 187   Cardiac Enzymes:  Recent Labs Lab 08/13/15 0540  CKTOTAL 43    BNP (last 3 results)  Recent Labs  08/04/15 0135  BNP 424.0*    ProBNP (last 3 results)  Recent Labs  05/03/15 1501 07/06/15 1217  PROBNP 199.0* 638.0*    CBG:  Recent Labs Lab 08/11/15 2217  08/11/15 2316 08/13/15 0428 08/13/15 0629 08/13/15 0716  GLUCAP 435* 382* 106* 50* 144*    Radiological Exams on Admission: Ct  Head Wo Contrast  08/13/2015  CLINICAL DATA:  Felt weak after getting up from bed. Status post fall. Hit head on dresser. Concern for maxillofacial injury. Initial encounter. EXAM: CT HEAD WITHOUT CONTRAST CT MAXILLOFACIAL WITHOUT CONTRAST TECHNIQUE: Multidetector CT imaging of the head and maxillofacial structures were performed using the standard protocol without intravenous contrast. Multiplanar CT image reconstructions of the maxillofacial structures were also generated. COMPARISON:  CT of the head performed 08/11/2015, and CT of the maxillofacial structures performed 07/17/2015 FINDINGS: CT HEAD FINDINGS There is no evidence of acute infarction, mass lesion, or intra- or extra-axial hemorrhage on CT. Prominence of the sulci suggests mild cortical volume loss. Mild periventricular white matter change likely reflects small vessel ischemic microangiopathy. Mild cerebellar atrophy is noted. A small chronic lacunar infarct is noted at the right basal ganglia. The brainstem and fourth ventricle are within normal limits. The cerebral hemispheres demonstrate grossly normal gray-white differentiation. No mass effect or midline shift is seen. There is no evidence of fracture; visualized osseous structures are unremarkable in appearance. The visualized portions of the orbits are within normal limits. The paranasal sinuses and mastoid air cells are well-aerated. No significant soft tissue abnormalities are seen. CT MAXILLOFACIAL FINDINGS There is no evidence of fracture or dislocation. The maxilla and mandible appear intact. The nasal bone is unremarkable in appearance. The visualized dentition demonstrates no acute abnormality. There is incomplete fusion of the posterior arch of C1. The orbits are intact bilaterally. The visualized paranasal sinuses and mastoid air cells are well-aerated.  No significant soft tissue abnormalities are seen. The parapharyngeal fat planes are preserved. The nasopharynx, oropharynx and hypopharynx are unremarkable in appearance. The visualized portions of the valleculae and piriform sinuses are grossly unremarkable. The parotid and submandibular glands are within normal limits. No cervical lymphadenopathy is seen. IMPRESSION: 1. No evidence of traumatic intracranial injury or fracture. 2. No evidence of fracture or dislocation with regard to the maxillofacial structures. 3. Mild cortical volume loss and scattered small vessel ischemic microangiopathy. 4. 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 Electronically Signed   By: Garald Balding M.D.   On: 08/13/2015 06:37   Ct Head Wo Contrast  08/11/2015  CLINICAL DATA:  70 year old female with history of headache for the past 2 weeks. Elevated blood pressure. Vomiting. EXAM: CT HEAD WITHOUT CONTRAST TECHNIQUE: Contiguous axial images were obtained from the base of the skull through the vertex without intravenous contrast. COMPARISON:  Head CT 08/04/2015. FINDINGS: Mild cerebral atrophy. Patchy and confluent areas of decreased attenuation are noted throughout the deep and periventricular white matter of the cerebral hemispheres bilaterally, compatible with chronic microvascular ischemic disease. No acute intracranial abnormalities. Specifically, no evidence of acute intracranial hemorrhage, no definite findings of acute/subacute cerebral ischemia, no mass, mass effect, hydrocephalus or abnormal intra or extra-axial fluid collections. Visualized paranasal sinuses and mastoids are well pneumatized, with exception of some mild multifocal mucosal thickening throughout the ethmoid sinuses. No acute displaced skull fractures are identified. IMPRESSION: 1. No acute intracranial abnormalities. 2. Mild cerebral atrophy with mild chronic microvascular ischemic changes in cerebral white matter. Electronically Signed   By:  Vinnie Langton M.D.   On: 08/11/2015 20:43   Ct Maxillofacial Wo Cm  08/13/2015  CLINICAL DATA:  Felt weak after getting up from bed. Status post fall. Hit head on dresser. Concern for maxillofacial injury. Initial encounter. EXAM: CT HEAD WITHOUT CONTRAST CT MAXILLOFACIAL WITHOUT CONTRAST TECHNIQUE: Multidetector CT imaging of the head and maxillofacial structures were performed using  the standard protocol without intravenous contrast. Multiplanar CT image reconstructions of the maxillofacial structures were also generated. COMPARISON:  CT of the head performed 08/11/2015, and CT of the maxillofacial structures performed 07/17/2015 FINDINGS: CT HEAD FINDINGS There is no evidence of acute infarction, mass lesion, or intra- or extra-axial hemorrhage on CT. Prominence of the sulci suggests mild cortical volume loss. Mild periventricular white matter change likely reflects small vessel ischemic microangiopathy. Mild cerebellar atrophy is noted. A small chronic lacunar infarct is noted at the right basal ganglia. The brainstem and fourth ventricle are within normal limits. The cerebral hemispheres demonstrate grossly normal gray-white differentiation. No mass effect or midline shift is seen. There is no evidence of fracture; visualized osseous structures are unremarkable in appearance. The visualized portions of the orbits are within normal limits. The paranasal sinuses and mastoid air cells are well-aerated. No significant soft tissue abnormalities are seen. CT MAXILLOFACIAL FINDINGS There is no evidence of fracture or dislocation. The maxilla and mandible appear intact. The nasal bone is unremarkable in appearance. The visualized dentition demonstrates no acute abnormality. There is incomplete fusion of the posterior arch of C1. The orbits are intact bilaterally. The visualized paranasal sinuses and mastoid air cells are well-aerated. No significant soft tissue abnormalities are seen. The parapharyngeal fat planes  are preserved. The nasopharynx, oropharynx and hypopharynx are unremarkable in appearance. The visualized portions of the valleculae and piriform sinuses are grossly unremarkable. The parotid and submandibular glands are within normal limits. No cervical lymphadenopathy is seen. IMPRESSION: 1. No evidence of traumatic intracranial injury or fracture. 2. No evidence of fracture or dislocation with regard to the maxillofacial structures. 3. Mild cortical volume loss and scattered small vessel ischemic microangiopathy. 4. 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 Electronically Signed   By: Garald Balding M.D.   On: 08/13/2015 06:37    EKG:  Pending  Assessment/Plan Principal Problem:   Syncope and collapse Active Problems:   Hypoglycemia associated with diabetes (HCC)   Hypothermia   Chronic diastolic heart failure ([place])   Poorly controlled diabetes mellitus ([place])   Essential hypertension   Fall at home   1. Patient is a 70 year old who presents with syncope and collapse causing mild right scalp bruising. She was found to have a CBG of 43 by EMS. Hypoglycemia is likely the etiology of her syncope. She does have a history of syncope in the past and per her account, the workup has been essentially negative. 2-D echocardiogram 06/2015 revealed an EF of 60-65%. She is currently alert and oriented. CT of her head and face reveals no acute fractures. Her hypothermic and is likely from hypoglycemia and being found down. Her core temperature has improved following the warmer. She will be admitted on telemetry for observation and further management. 2. Patient was given D50 in the ED. We'll continue dextrose with D5 infusion. We'll ask the nursing staff to check her CBG every hour 6 or until her CBG is 160 or greater 2. Obviously will hold insulin. Will check a hemoglobin A1c. 3. We will restart her chronic blood pressure medications including quinapril and Bystolic. Will give IV hydralazine  1 for accelerated blood pressure. 4. We'll continue Lasix for treatment of chronic diastolic heart failure which appears to be compensated. 5. Although her hypothermia was likely from hypoglycemia, will order urinalysis and chest x-ray to rule out infection. 6. We'll continue Synthroid. We'll check a TSH. 7. Will order an EKG for evaluation. 8. Order PT for evaluation.  Code Status: Full code DVT Prophylaxis: SCDs Family Communication: Discussed with patient; family not available Disposition Plan: Discharge in the next 24-48 hours  Time spent: One hour  [facility] Pager [phone]

## 2015-08-13 NOTE — ED Notes (Signed)
Pt on bedpan and bed soiled- linen changed pt in fresh gown

## 2015-08-13 NOTE — ED Notes (Signed)
Report to Lauren RN

## 2015-08-13 NOTE — ED Notes (Signed)
Call for report- nurse unavailable- will call back

## 2015-08-13 NOTE — ED Notes (Signed)
Pt placed under bair hugger

## 2015-08-13 NOTE — ED Notes (Signed)
Pt ambulated to the restroom w/ this nurse at pt side. Pt missed the urine cap attempting to get a urine sample. Pt states she will try again in a little bit.

## 2015-08-13 NOTE — ED Notes (Signed)
Pt unhappy with temp of room- she has a bear hugger on with multiple blankets. She is not happy that she has had her lasix as it has given her a headache- blanket provided and PRN med for complaint of Headache

## 2015-08-13 NOTE — ED Notes (Signed)
Pt reports that all of her family is dead save for a 71 yr old sister with altzheimers, she does have nieces and nephews here. She becomes more alert and animated when speaking of her youth, people she has known and her deceased spouse who she loved very much

## 2015-08-13 NOTE — ED Notes (Signed)
Pt reports that she wants to die should she not be able to get better, reports that she has lived in this area for the last 8 months and that as soon as she received the lasix ( from [place] and [place] not real laxis) she had a headache.

## 2015-08-13 NOTE — ED Notes (Addendum)
Skin tear to the right forearm. Cleaned w/ shur clens & covered for EDP to look at. Pt says last tetanus shot was last year.

## 2015-08-13 NOTE — ED Notes (Signed)
Pt given crackers, peanut butter & a drink.

## 2015-08-13 NOTE — ED Notes (Signed)
Pt appears more comfortable, but reports no relief from headache

## 2015-08-13 NOTE — ED Notes (Signed)
Pt refused Tylenol for headache.

## 2015-08-13 NOTE — ED Notes (Signed)
Pt got up to get something to eat. & says she woke up on the floor. Per EMS pt cbg was 46, amp of d50 given. Pt complaining of head & right arm pain. Pt states she thinks she was in the floor about 1 hour before calling EMS

## 2015-08-13 NOTE — ED Notes (Signed)
Offered crackers peanut butter (pt refused) and ginger ale for low blood sugar until lunch arrives

## 2015-08-14 DIAGNOSIS — E162 Hypoglycemia, unspecified: Secondary | ICD-10-CM | POA: Diagnosis not present

## 2015-08-14 LAB — CBC
HEMATOCRIT: 33.4 % — AB (ref 36.0–46.0)
HEMOGLOBIN: 11.3 g/dL — AB (ref 12.0–15.0)
MCH: 27.5 pg (ref 26.0–34.0)
MCHC: 33.8 g/dL (ref 30.0–36.0)
MCV: 81.3 fL (ref 78.0–100.0)
Platelets: 181 10*3/uL (ref 150–400)
RBC: 4.11 MIL/uL (ref 3.87–5.11)
RDW: 15.6 % — AB (ref 11.5–15.5)
WBC: 5.6 10*3/uL (ref 4.0–10.5)

## 2015-08-14 LAB — GLUCOSE, CAPILLARY
GLUCOSE-CAPILLARY: 412 mg/dL — AB (ref 65–99)
GLUCOSE-CAPILLARY: 414 mg/dL — AB (ref 65–99)
Glucose-Capillary: 417 mg/dL — ABNORMAL HIGH (ref 65–99)
Glucose-Capillary: 524 mg/dL — ABNORMAL HIGH (ref 65–99)

## 2015-08-14 LAB — HEMOGLOBIN A1C
Hgb A1c MFr Bld: 7.8 % — ABNORMAL HIGH (ref 4.8–5.6)
Mean Plasma Glucose: 177 mg/dL

## 2015-08-14 LAB — BASIC METABOLIC PANEL
ANION GAP: 10 (ref 5–15)
BUN: 21 mg/dL — AB (ref 6–20)
CHLORIDE: 100 mmol/L — AB (ref 101–111)
CO2: 25 mmol/L (ref 22–32)
Calcium: 8.4 mg/dL — ABNORMAL LOW (ref 8.9–10.3)
Creatinine, Ser: 0.84 mg/dL (ref 0.44–1.00)
GFR calc Af Amer: >60 mL/min (ref >60)
Glucose, Bld: 474 mg/dL — ABNORMAL HIGH (ref 65–99)
POTASSIUM: 4.6 mmol/L (ref 3.5–5.1)
SODIUM: 135 mmol/L (ref 135–145)

## 2015-08-14 MED ORDER — TORSEMIDE 20 MG PO TABS
20.0000 mg | ORAL_TABLET | Freq: Every day | ORAL | Status: DC
Start: 2015-08-14 — End: 2015-09-15

## 2015-08-14 MED ORDER — INSULIN REGULAR HUMAN (CONC) 500 UNIT/ML ~~LOC~~ SOLN: 8.0000 [IU] | Freq: Every day | SUBCUTANEOUS | Status: DC

## 2015-08-14 NOTE — Progress Notes (Signed)
Patient alert and oriented, independent, VSS, pt. Tolerating diet well. No complaints of pain or nausea. Pt. Had IV removed tip intact. Pt. Had prescriptions given. Pt. Voiced understanding of discharge instructions with no further questions.

## 2015-08-14 NOTE — Discharge Summary (Signed)
Physician Discharge Summary  Jaclyn Herrera YNW:[id number] DOB: [birth year] DOA: 08/13/2015   Admit date: 08/13/2015 Discharge date: 08/14/2015  Discharge Diagnoses:  Principal Problem:   Syncope and collapse Active Problems:   Poorly controlled diabetes mellitus (HCC)   Essential hypertension   Hypothyroid   Chronic diastolic heart failure (HCC)   Hypoglycemia associated with diabetes (HCC)   Hypothermia   Fall at home   UTI (urinary tract infection)    Wt Readings from Last 3 Encounters:  08/14/15 196 lb (88.905 kg)  08/11/15 196 lb 2 oz (88.962 kg)  08/04/15 203 lb (92.08 kg)     Hospital Course:  This patient is a 70 year old female with diabetes who presented with syncope and hypoglycemia. Paramedics found her glucose to be 43. She states that she had skipped eating. She has been taking U500 10 to 15 units daily. Diabetes is managed by her endocrinologist. Hemoglobin A1c is 7.8. She was treated with dextrose. She remained stable neurologically after being hospitalized. Glucose increased to the 400 range. She feels back to baseline now. She has been hypertensive. She has refused furosemide. She has been treated for diastolic heart failure and pleural effusions with furosemide. Chest x-ray reveals no pulmonary edema or effusions now. She takes quinapril and nebivolol for hypertension. Renal function is stable with a BUN and creatinine 21 and 0.84. Potassium is 4.6.  Urinalysis revealed white cells and red cells but she has no dysuria. Culture is pending. She was treated with ceftriaxone on admission. She completed a course of cefuroxime 2 days ago for a recent sinusitis.  Condition at discharge is much improved. She will be seen in follow-up in one week.  Discharge exam reveals clear lungs. Heart regular with no murmurs. Abdomen soft and nontender. Extremities reveal no edema. Neuro at baseline.  Encouraged reducing insulin to 8 units daily. Advised follow-up with her endocrinologist  next week. Stressed the importance of maintaining proper calorie intake with 3 meals per day. Check glucoses 4 times daily for now.   Discharge Instructions     Medication List    STOP taking these medications        acetaminophen-codeine 300-30 MG tablet  Commonly known as:  TYLENOL #3     ARNICARE ARNICA Crea     furosemide 20 MG tablet  Commonly known as:  LASIX     meclizine 25 MG tablet  Commonly known as:  ANTIVERT      TAKE these medications        acetaminophen 500 MG tablet  Commonly known as:  TYLENOL  Take 500-1,000 mg by mouth every 6 (six) hours as needed for headache. Reported on 07/17/2015     albuterol 108 (90 Base) MCG/ACT inhaler  Commonly known as:  PROVENTIL HFA;VENTOLIN HFA  Inhale 2 puffs into the lungs every 4 (four) hours as needed for wheezing or shortness of breath.     BLINK TEARS OP  Place 1 drop into both eyes 4 (four) times daily as needed (Dry eyes).     carisoprodol 350 MG tablet  Commonly known as:  SOMA  Take 175-350 mg by mouth 3 (three) times daily as needed for muscle spasms. For cramps     cefUROXime 250 MG tablet  Commonly known as:  CEFTIN  Take 250 mg by mouth 2 (two) times daily with a meal. 10 day course starting on 08/02/15     esomeprazole 20 MG capsule  Commonly known as:  NEXIUM  Take 20 mg by mouth  daily at 12 noon.     fluticasone 50 MCG/ACT nasal spray  Commonly known as:  FLONASE  Place 2 sprays into the nose 2 (two) times daily as needed for allergies.     insulin regular human CONCENTRATED 500 UNIT/ML injection  Commonly known as:  HUMULIN R  Inject 0.02 mLs (10 Units total) into the skin daily. Sliding scale range is reported 60-150 units based on a sliding scale. She takes it 30 minutes before meals.     levothyroxine 125 MCG tablet  Commonly known as:  SYNTHROID, LEVOTHROID  Take 125-187.5 mcg by mouth daily. Takes 125 mcg daily. On Sundays she takes 187.5 mcg     nebivolol 10 MG tablet  Commonly known  as:  BYSTOLIC  Take 10 mg by mouth daily.     ondansetron 4 MG tablet  Commonly known as:  ZOFRAN  Take 1 tablet (4 mg total) by mouth every 6 (six) hours as needed for nausea.     PARoxetine 40 MG tablet  Commonly known as:  PAXIL  Take 40 mg by mouth daily.     PERDIEM PO  Take 1 tablet by mouth daily as needed (constipation). Reported on 07/17/2015     potassium chloride 10 MEQ tablet  Commonly known as:  K-DUR  Take 1 tablet (10 mEq total) by mouth daily.     quinapril 40 MG tablet  Commonly known as:  ACCUPRIL  Take 40 mg by mouth at bedtime.     simvastatin 20 MG tablet  Commonly known as:  ZOCOR  Take 20 mg by mouth at bedtime. Reported on 07/17/2015     torsemide 20 MG tablet  Commonly known as:  DEMADEX  Take 1 tablet (20 mg total) by mouth daily.         Blaize Epple 08/14/2015

## 2015-08-14 NOTE — Progress Notes (Signed)
Text paged Dr Marin Comment second to patient had two CBG > 160 through out the night await return call and/or new orders.Patient resting comfortably  With no complaints or concerns, patient informed of CBG readings.

## 2015-08-15 DIAGNOSIS — L89620 Pressure ulcer of left heel, unstageable: Secondary | ICD-10-CM | POA: Diagnosis not present

## 2015-08-15 DIAGNOSIS — F329 Major depressive disorder, single episode, unspecified: Secondary | ICD-10-CM | POA: Diagnosis not present

## 2015-08-15 DIAGNOSIS — M1990 Unspecified osteoarthritis, unspecified site: Secondary | ICD-10-CM | POA: Diagnosis not present

## 2015-08-15 DIAGNOSIS — I10 Essential (primary) hypertension: Secondary | ICD-10-CM | POA: Diagnosis not present

## 2015-08-15 DIAGNOSIS — E119 Type 2 diabetes mellitus without complications: Secondary | ICD-10-CM | POA: Diagnosis not present

## 2015-08-15 DIAGNOSIS — E039 Hypothyroidism, unspecified: Secondary | ICD-10-CM | POA: Diagnosis not present

## 2015-08-15 LAB — URINE CULTURE: Culture: 30000

## 2015-08-19 DIAGNOSIS — S92035D Nondisplaced avulsion fracture of tuberosity of left calcaneus, subsequent encounter for fracture with routine healing: Secondary | ICD-10-CM | POA: Diagnosis not present

## 2015-08-21 ENCOUNTER — Other Ambulatory Visit: Payer: Self-pay | Admitting: Pulmonary Disease

## 2015-08-23 DIAGNOSIS — L89620 Pressure ulcer of left heel, unstageable: Secondary | ICD-10-CM | POA: Diagnosis not present

## 2015-08-23 DIAGNOSIS — E119 Type 2 diabetes mellitus without complications: Secondary | ICD-10-CM | POA: Diagnosis not present

## 2015-08-23 DIAGNOSIS — Z6833 Body mass index (BMI) 33.0-33.9, adult: Secondary | ICD-10-CM | POA: Diagnosis not present

## 2015-08-23 DIAGNOSIS — F329 Major depressive disorder, single episode, unspecified: Secondary | ICD-10-CM | POA: Diagnosis not present

## 2015-08-23 DIAGNOSIS — I5032 Chronic diastolic (congestive) heart failure: Secondary | ICD-10-CM | POA: Diagnosis not present

## 2015-08-23 DIAGNOSIS — N390 Urinary tract infection, site not specified: Secondary | ICD-10-CM | POA: Diagnosis not present

## 2015-08-23 DIAGNOSIS — M1990 Unspecified osteoarthritis, unspecified site: Secondary | ICD-10-CM | POA: Diagnosis not present

## 2015-08-23 DIAGNOSIS — I10 Essential (primary) hypertension: Secondary | ICD-10-CM | POA: Diagnosis not present

## 2015-08-23 DIAGNOSIS — E039 Hypothyroidism, unspecified: Secondary | ICD-10-CM | POA: Diagnosis not present

## 2015-09-02 DIAGNOSIS — E119 Type 2 diabetes mellitus without complications: Secondary | ICD-10-CM | POA: Diagnosis not present

## 2015-09-02 DIAGNOSIS — E039 Hypothyroidism, unspecified: Secondary | ICD-10-CM | POA: Diagnosis not present

## 2015-09-02 DIAGNOSIS — I10 Essential (primary) hypertension: Secondary | ICD-10-CM | POA: Diagnosis not present

## 2015-09-02 DIAGNOSIS — F329 Major depressive disorder, single episode, unspecified: Secondary | ICD-10-CM | POA: Diagnosis not present

## 2015-09-02 DIAGNOSIS — L89620 Pressure ulcer of left heel, unstageable: Secondary | ICD-10-CM | POA: Diagnosis not present

## 2015-09-02 DIAGNOSIS — M1990 Unspecified osteoarthritis, unspecified site: Secondary | ICD-10-CM | POA: Diagnosis not present

## 2015-09-09 DIAGNOSIS — M1990 Unspecified osteoarthritis, unspecified site: Secondary | ICD-10-CM | POA: Diagnosis not present

## 2015-09-09 DIAGNOSIS — F329 Major depressive disorder, single episode, unspecified: Secondary | ICD-10-CM | POA: Diagnosis not present

## 2015-09-09 DIAGNOSIS — L89620 Pressure ulcer of left heel, unstageable: Secondary | ICD-10-CM | POA: Diagnosis not present

## 2015-09-09 DIAGNOSIS — I10 Essential (primary) hypertension: Secondary | ICD-10-CM | POA: Diagnosis not present

## 2015-09-09 DIAGNOSIS — E039 Hypothyroidism, unspecified: Secondary | ICD-10-CM | POA: Diagnosis not present

## 2015-09-09 DIAGNOSIS — E119 Type 2 diabetes mellitus without complications: Secondary | ICD-10-CM | POA: Diagnosis not present

## 2015-09-15 ENCOUNTER — Other Ambulatory Visit: Payer: Self-pay | Admitting: *Deleted

## 2015-09-15 ENCOUNTER — Ambulatory Visit (INDEPENDENT_AMBULATORY_CARE_PROVIDER_SITE_OTHER): Payer: Medicare Other | Admitting: Cardiovascular Disease

## 2015-09-15 ENCOUNTER — Encounter: Payer: Self-pay | Admitting: Cardiovascular Disease

## 2015-09-15 VITALS — BP 130/70 | HR 60 | Ht 68.0 in | Wt 196.0 lb

## 2015-09-15 DIAGNOSIS — I10 Essential (primary) hypertension: Secondary | ICD-10-CM

## 2015-09-15 DIAGNOSIS — I340 Nonrheumatic mitral (valve) insufficiency: Secondary | ICD-10-CM

## 2015-09-15 DIAGNOSIS — I5032 Chronic diastolic (congestive) heart failure: Secondary | ICD-10-CM | POA: Diagnosis not present

## 2015-09-15 LAB — BASIC METABOLIC PANEL
BUN: 34 mg/dL — ABNORMAL HIGH (ref 7–25)
CALCIUM: 9 mg/dL (ref 8.6–10.4)
CO2: 31 mmol/L (ref 20–31)
CREATININE: 1.2 mg/dL — AB (ref 0.60–0.93)
Chloride: 98 mmol/L (ref 98–110)
Glucose, Bld: 68 mg/dL (ref 65–99)
Potassium: 4.1 mmol/L (ref 3.5–5.3)
SODIUM: 141 mmol/L (ref 135–146)

## 2015-09-15 MED ORDER — POTASSIUM CHLORIDE ER 10 MEQ PO TBCR: 10.0000 meq | EXTENDED_RELEASE_TABLET | Freq: Every day | ORAL | Status: DC

## 2015-09-15 MED ORDER — TORSEMIDE 20 MG PO TABS: 20.0000 mg | ORAL_TABLET | Freq: Two times a day (BID) | ORAL | Status: DC

## 2015-09-15 MED ORDER — FUROSEMIDE 20 MG PO TABS: 20.0000 mg | ORAL_TABLET | Freq: Every day | ORAL | Status: DC

## 2015-09-15 NOTE — Progress Notes (Signed)
Chief Complaint  Patient presents with  . Follow-up     History of Present Illness: 70 yo female with history of DM, HTN, HLD, hypothyroidism, GERD, anemia here today for cardiac follow up. I saw her as a new patient 04/30/13  for evaluation of dyspnea. She has no prior documented cardiac disease. She told me that she has been having dyspnea with minimal exertion. She has recently gained 29 lbs over last 6 months. She also describes chest pressure, center of chest when walking. Resolves quickly with rest. She also has a dry hacking cough. She has a rescue inhaler for frequent episodes of bronchitis. She had a stress test 15 years ago. She had a cardiac cath that year and was told it was normal. Chronic right lower ext edema from orthopedic injury. Echo 05/15/13 with moderate LVH, normal LV function, grade 2 diastolic dysfunction, mild MR. Lexiscan stress myoview 05/22/13 with no ischemia. She was seen in our office by Cecilie Kicks, NP in November 2016 for chest pain, dyspnea. D-dimer was elevated which lead to a CTA of the chest. This showed no PE but there were bilateral pleural effusions. She was then seen by Dr. Elsworth Soho in the pulmonary office and underwent thoracentesis on 06/04/15. She was started on Lasix and Norvasc was stopped. Echo 06/1915 with normal LV systolic function, elevated filling pressures and mild MR. With higher dose of lasix, her LE edema improved and her weight was down. Admitted to [facility] 08/13/15 after syncopal episode. Chest xray with no pulmonary edema or pleural effusions. Lasix was stopped. She has been on torsemide 20 mg BID and Lasix 20 mg daily (by her choice).   She is here today for follow up.No SOB. No chest pain or LE edema. She feels great.   Primary Care Physician: Asencion Noble, MD  Endocrine: Forde Dandy   Past Medical History  Diagnosis Date  . Diabetes mellitus   . Hypertension   . Arthritis   . Shingles   . hypothyroidism   . Bronchitis   . Hyperlipidemia   . GERD  (gastroesophageal reflux disease)   . PONV (postoperative nausea and vomiting)   . Headache   . CHF (congestive heart failure) ([place])   . DKA (diabetic ketoacidoses) ([place]) 09/27/2014  . Chronic diastolic heart failure ([place]) 07/13/2015  . Hypothyroid 10/31/2014  . Vertigo 10/31/2014  . Syncope 10/31/2014    Past Surgical History  Procedure Laterality Date  . Cholecystectomy    . Ankle surgery      x 4  . Non cancerous mass removed      small intestine  . Tonsillectomy    . Nasal sinus surgery    . Cataract extraction Bilateral   . Appendectomy    . Knee arthroscopy    . Orif calcaneous fracture Left 12/29/2014    Procedure: OPEN REDUCTION INTERNAL FIXATION  LEFT CALCANEOUS FRACTURE;  Surgeon: Gaynelle Arabian, MD;  Location: [facility] ORS;  Service: Orthopedics;  Laterality: Left;    Current Outpatient Prescriptions  Medication Sig Dispense Refill  . acetaminophen (TYLENOL) 500 MG tablet Take 500-1,000 mg by mouth every 6 (six) hours as needed for headache. Reported on 07/17/2015    . albuterol (PROVENTIL HFA;VENTOLIN HFA) 108 (90 BASE) MCG/ACT inhaler Inhale 2 puffs into the lungs every 4 (four) hours as needed for wheezing or shortness of breath.    . carisoprodol (SOMA) 350 MG tablet Take 175-350 mg by mouth 3 (three) times daily as needed for muscle spasms. For cramps    .  esomeprazole (NEXIUM) 20 MG capsule Take 20 mg by mouth daily at 12 noon.    . fluticasone (FLONASE) 50 MCG/ACT nasal spray Place 2 sprays into the nose 2 (two) times daily as needed for allergies.     Marland Kitchen insulin regular human CONCENTRATED (HUMULIN R) 500 UNIT/ML injection Inject 0.02 mLs (10 Units total) into the skin daily. Sliding scale range is reported 60-150 units based on a sliding scale. She takes it 30 minutes before meals. 20 mL 0  . levothyroxine (SYNTHROID, LEVOTHROID) 125 MCG tablet Take 125-187.5 mcg by mouth daily. Takes 125 mcg daily. On Sundays she takes 187.5 mcg    . nebivolol (BYSTOLIC) 10 MG tablet Take 10  mg by mouth daily.    . ondansetron (ZOFRAN) 4 MG tablet Take 1 tablet (4 mg total) by mouth every 6 (six) hours as needed for nausea. 40 tablet 0  . PARoxetine (PAXIL) 40 MG tablet Take 40 mg by mouth daily.    . Polyethylene Glycol 400 (BLINK TEARS OP) Place 1 drop into both eyes 4 (four) times daily as needed (Dry eyes).     . potassium chloride (K-DUR) 10 MEQ tablet Take 1 tablet (10 mEq total) by mouth daily. 90 tablet 3  . quinapril (ACCUPRIL) 40 MG tablet Take 40 mg by mouth at bedtime.    . Senna-Psyllium (PERDIEM PO) Take 1 tablet by mouth daily as needed (constipation). Reported on 07/17/2015    . simvastatin (ZOCOR) 20 MG tablet Take 20 mg by mouth at bedtime. Reported on 07/17/2015    . torsemide (DEMADEX) 20 MG tablet Take 1 tablet (20 mg total) by mouth daily. 30 tablet 12  . furosemide (LASIX) 20 MG tablet Take 20 mg by mouth 2 (two) times daily.     No current facility-administered medications for this visit.    Allergies  Allergen Reactions  . Sulfonamide Derivatives Anaphylaxis  . Bactrim [Sulfamethoxazole-Trimethoprim] Nausea Only  . Erythromycin Nausea And Vomiting  . Mucinex [Guaifenesin Er] Nausea And Vomiting  . Aspirin Rash  . Levofloxacin Rash  . Tape Rash    Social History   Social History  . Marital Status: Widowed    Spouse Name: N/A  . Number of Children: N/A  . Years of Education: N/A   Occupational History  . Retired-[employer]    Social History Main Topics  . Smoking status: Never Smoker   . Smokeless tobacco: Never Used  . Alcohol Use: No  . Drug Use: No  . Sexual Activity: Not Currently   Other Topics Concern  . Not on file   Social History Narrative    Family History  Problem Relation Age of Onset  . Diabetes Mother   . Asthma Mother   . Hypertension Father   . Heart disease Father   . Diabetes Father   . Heart disease Sister   . Hypertension Sister   . Asthma Sister   . Diabetes Brother   . CAD Brother   . Stroke Brother      Review of Systems:  As stated in the HPI and otherwise negative.   BP 130/70 mmHg  Pulse 60  Ht 5\' 8"  (1.727 m)  Wt 196 lb (88.905 kg)  BMI 29.81 kg/m2  SpO2 96%  Physical Examination: General: Well developed, well nourished, NAD HEENT: OP clear, mucus membranes moist SKIN: warm, dry. No rashes. Neuro: No focal deficits Musculoskeletal: Muscle strength 5/5 all ext Psychiatric: Mood and affect normal Neck: No JVD, no carotid bruits, no thyromegaly,  no lymphadenopathy. Lungs:Clear bilaterally, no wheezes, rhonci, crackles Cardiovascular: Regular rate and rhythm. No murmurs, gallops or rubs. Abdomen:Soft. Bowel sounds present. Non-tender.  Extremities: Trace bilateral lower extremity edema.   Echo 07/01/15: Left ventricle: The cavity size was normal. Wall thickness was increased in a pattern of mild LVH. Systolic function was normal. The estimated ejection fraction was in the range of 60% to 65%. Doppler parameters are consistent with elevated ventricular end-diastolic filling pressure. - Mitral valve: Calcified annulus. Mildly thickened leaflets . There was mild to moderate regurgitation. - Left atrium: The atrium was moderately dilated. - Atrial septum: No defect or patent foramen ovale was identified.  Lexiscan stress myoview 05/22/13: Stress Procedure: The patient received IV Lexiscan 0.4 mg over 15-seconds. Technetium 82m Sestamibi injected at 30-seconds. This patient had sob with the Lexiscan injection. Quantitative spect images were obtained after a 45 minute delay.  Stress ECG: No significant change from baseline ECG  QPS  Raw Data Images: Normal; no motion artifact; normal heart/lung ratio.  Stress Images: Normal homogeneous uptake in all areas of the myocardium.  Rest Images: Normal homogeneous uptake in all areas of the myocardium.  Subtraction (SDS): Normal  Transient Ischemic Dilatation (Normal <1.22): 1.06  Lung/Heart Ratio (Normal <0.45): 0.40   Quantitative Gated Spect Images  QGS EDV: 92 ml  QGS ESV: 28 ml  Impression  Exercise Capacity: Lexiscan with no exercise.  BP Response: Normal blood pressure response.  Clinical Symptoms: There is dyspnea.  ECG Impression: No significant ST segment change suggestive of ischemia.  Comparison with Prior Nuclear Study: No images to compare  Overall Impression: Normal stress nuclear study.  LV Ejection Fraction: 70%. LV Wall Motion: NL LV Function; NL Wall Motion   EKG:  EKG is not ordered today. The ekg ordered today demonstrates   Recent Labs: 07/06/2015: Pro B Natriuretic peptide (BNP) 638.0* 08/04/2015: B Natriuretic Peptide 424.0* 08/13/2015: ALT 11*; TSH 2.151 08/14/2015: BUN 21*; Creatinine, Ser 0.84; Hemoglobin 11.3*; Platelets 181; Potassium 4.6; Sodium 135   Lipid Panel No results found for: CHOL, TRIG, HDL, CHOLHDL, VLDL, LDLCALC, LDLDIRECT   Wt Readings from Last 3 Encounters:  09/15/15 196 lb (88.905 kg)  08/14/15 196 lb (88.905 kg)  08/11/15 196 lb 2 oz (88.962 kg)     Other studies Reviewed: Additional studies/ records that were reviewed today include: . Review of the above records demonstrates:    Assessment and Plan:   1. Acute on chronic diastolic CHF: Her weight is stable. Will check BMET today. LE edema is improved. Will continue torsemide 20 mg BID. She wishes to also use Lasix 20 mg in the am. She will weigh herself daily an adjust Lasix as needed. Continue potassium.   2. Chest pain: Resolved. Likely non-cardiac. She is known to have normal coronary arteries. Stress test 2014 with no ischemia.    3. Mitral regurgitation: Mild by echo 2017  4. HTN: BP controlled. No changes today.   Current medicines are reviewed at length with the patient today.  The patient does not have concerns regarding medicines.  The following changes have been made:  no change  Labs/ tests ordered today include:   Orders Placed This Encounter  Procedures  . Basic Metabolic  Panel (BMET)     Disposition:   FU with me in 12  months   Signed, Lauree Chandler, MD 09/15/2015 12:33 PM    [address], [place], [place]  [address] Phone: [phone]; Fax: [phone]

## 2015-09-15 NOTE — Patient Instructions (Signed)
Medication Instructions:  Your physician recommends that you continue on your current medications as directed. Please refer to the Current Medication list given to you today.   Labwork: Lab work to be done today--BMP  Testing/Procedures: none  Follow-Up: Your physician wants you to follow-up in: 6 months.  You will receive a reminder letter in the mail two months in advance. If you don't receive a letter, please call our office to schedule the follow-up appointment.   Any Other Special Instructions Will Be Listed Below (If Applicable).     If you need a refill on your cardiac medications before your next appointment, please call your pharmacy.   

## 2015-09-17 DIAGNOSIS — E119 Type 2 diabetes mellitus without complications: Secondary | ICD-10-CM | POA: Diagnosis not present

## 2015-09-17 DIAGNOSIS — E039 Hypothyroidism, unspecified: Secondary | ICD-10-CM | POA: Diagnosis not present

## 2015-09-17 DIAGNOSIS — M1990 Unspecified osteoarthritis, unspecified site: Secondary | ICD-10-CM | POA: Diagnosis not present

## 2015-09-17 DIAGNOSIS — F329 Major depressive disorder, single episode, unspecified: Secondary | ICD-10-CM | POA: Diagnosis not present

## 2015-09-17 DIAGNOSIS — I10 Essential (primary) hypertension: Secondary | ICD-10-CM | POA: Diagnosis not present

## 2015-09-17 DIAGNOSIS — L89620 Pressure ulcer of left heel, unstageable: Secondary | ICD-10-CM | POA: Diagnosis not present

## 2015-09-23 ENCOUNTER — Other Ambulatory Visit: Payer: Medicare Other

## 2015-09-23 DIAGNOSIS — F329 Major depressive disorder, single episode, unspecified: Secondary | ICD-10-CM | POA: Diagnosis not present

## 2015-09-23 DIAGNOSIS — E119 Type 2 diabetes mellitus without complications: Secondary | ICD-10-CM | POA: Diagnosis not present

## 2015-09-23 DIAGNOSIS — E039 Hypothyroidism, unspecified: Secondary | ICD-10-CM | POA: Diagnosis not present

## 2015-09-23 DIAGNOSIS — L89620 Pressure ulcer of left heel, unstageable: Secondary | ICD-10-CM | POA: Diagnosis not present

## 2015-09-23 DIAGNOSIS — M1990 Unspecified osteoarthritis, unspecified site: Secondary | ICD-10-CM | POA: Diagnosis not present

## 2015-09-23 DIAGNOSIS — I10 Essential (primary) hypertension: Secondary | ICD-10-CM | POA: Diagnosis not present

## 2015-09-24 ENCOUNTER — Other Ambulatory Visit: Payer: Medicare Other

## 2015-09-30 ENCOUNTER — Telehealth: Payer: Self-pay | Admitting: Adult Health

## 2015-09-30 NOTE — Telephone Encounter (Signed)
lmtcb x1 for pt. 

## 2015-10-01 DIAGNOSIS — M1990 Unspecified osteoarthritis, unspecified site: Secondary | ICD-10-CM | POA: Diagnosis not present

## 2015-10-01 DIAGNOSIS — I10 Essential (primary) hypertension: Secondary | ICD-10-CM | POA: Diagnosis not present

## 2015-10-01 DIAGNOSIS — E119 Type 2 diabetes mellitus without complications: Secondary | ICD-10-CM | POA: Diagnosis not present

## 2015-10-01 DIAGNOSIS — E039 Hypothyroidism, unspecified: Secondary | ICD-10-CM | POA: Diagnosis not present

## 2015-10-01 DIAGNOSIS — F329 Major depressive disorder, single episode, unspecified: Secondary | ICD-10-CM | POA: Diagnosis not present

## 2015-10-01 DIAGNOSIS — L89620 Pressure ulcer of left heel, unstageable: Secondary | ICD-10-CM | POA: Diagnosis not present

## 2015-10-01 MED ORDER — NEBIVOLOL HCL 10 MG PO TABS: 10.0000 mg | ORAL_TABLET | Freq: Every day | ORAL | Status: DC

## 2015-10-01 NOTE — Telephone Encounter (Signed)
That is fine to refill  I can do PA for bystolic but we can change to bisoprolol if covered. After that rx will need to see PCP for futur bp control .  Please contact office for sooner follow up if symptoms do not improve or worsen or seek emergency care

## 2015-10-01 NOTE — Telephone Encounter (Signed)
Spoke with pt, states her PCP Dr. Willey Blade wrote her a rx for bystolic but refuses to do a PA per pt.  Pt states she was told by PCP that since he did not start her on this medication that he could fill it but would not do a PA for medication. Pt has enough bystolic for today and tomorrow.  Pt has not contacted her cardiologist for samples or rx.   We do not have samples of bystolic in sample cabinet.  I advised pt to contact cardiologist, but pt has requested to see if TP is ok with refilling this medication.    Pt uses [facility] in [place].    TP please advise if you're ok with continuing to fill this medication for pt.  Thanks!

## 2015-10-01 NOTE — Telephone Encounter (Signed)
Patient returning lindseys call. Please call back  [phone]

## 2015-10-01 NOTE — Telephone Encounter (Signed)
Refill of Bystolic sent to pharmacy. Patient will need to get refilled through PCP for further refills. Left detailed message on voicemail advising patient that Bystolic has been refilled. Nothing further needed.

## 2015-10-04 ENCOUNTER — Telehealth: Payer: Self-pay | Admitting: Adult Health

## 2015-10-04 NOTE — Telephone Encounter (Signed)
Pt says she will take last pill on tonight and the number she gave to insurance company is [phone].Jaclyn Herrera

## 2015-10-04 NOTE — Telephone Encounter (Signed)
Pt returning call.Jaclyn Herrera ° °

## 2015-10-04 NOTE — Telephone Encounter (Signed)
Pt says that insurance denied this please advise please give pt a call and give her an update on this.Jaclyn Herrera

## 2015-10-04 NOTE — Telephone Encounter (Signed)
lmtcb X1 for pt  

## 2015-10-05 NOTE — Telephone Encounter (Signed)
Spoke with pt, states she needs a PA for bystolic.   Pt uses [facility] in [place].   atc number listed below by patient, number is not a working number. Called [facility], pa form being faxed to our office, verified fax #. Will await fax.

## 2015-10-06 NOTE — Telephone Encounter (Signed)
Called [facility] in [place] and spoke with [place] for PA information to call: ID # [id number], [phone] She also mentioned the covered alternatives: Atenolol, Carvedilol, Metoprolol  Tammy please advise if you would like to change pt to a covered alternative, thanks.  Per 4.20.17 phone note: Melvenia Needles, NP at 10/01/2015 5:06 PM     Status: Signed       Expand All Collapse All   That is fine to refill  I can do PA for bystolic but we can change to bisoprolol if covered. After that rx will need to see PCP for futur bp control .  Please contact office for sooner follow up if symptoms do not improve or worsen or seek emergency care

## 2015-10-07 DIAGNOSIS — E039 Hypothyroidism, unspecified: Secondary | ICD-10-CM | POA: Diagnosis not present

## 2015-10-07 DIAGNOSIS — I10 Essential (primary) hypertension: Secondary | ICD-10-CM | POA: Diagnosis not present

## 2015-10-07 DIAGNOSIS — F329 Major depressive disorder, single episode, unspecified: Secondary | ICD-10-CM | POA: Diagnosis not present

## 2015-10-07 DIAGNOSIS — M1990 Unspecified osteoarthritis, unspecified site: Secondary | ICD-10-CM | POA: Diagnosis not present

## 2015-10-07 DIAGNOSIS — E119 Type 2 diabetes mellitus without complications: Secondary | ICD-10-CM | POA: Diagnosis not present

## 2015-10-07 DIAGNOSIS — L89620 Pressure ulcer of left heel, unstageable: Secondary | ICD-10-CM | POA: Diagnosis not present

## 2015-10-07 MED ORDER — METOPROLOL TARTRATE 25 MG PO TABS: 25.0000 mg | ORAL_TABLET | Freq: Two times a day (BID) | ORAL | Status: DC

## 2015-10-07 NOTE — Telephone Encounter (Signed)
May Try Metoprolol 25mg  Twice daily  #60 .  Give her 3 refills and then see if PCP will follow after this for ongoing b/p issues  Please contact office for sooner follow up if symptoms do not improve or worsen or seek emergency care

## 2015-10-07 NOTE — Telephone Encounter (Signed)
Spoke with pt and gave her TP's recommendations. Pt is hesitant to start another new medication as other BP meds have caused her problems, but she is willing to try. Advised pt that if this rx causes her any issues she needs to call office as soon as possible. Rx sent to [facility]. Nothing further needed.

## 2015-10-13 DIAGNOSIS — E119 Type 2 diabetes mellitus without complications: Secondary | ICD-10-CM | POA: Diagnosis not present

## 2015-10-13 DIAGNOSIS — F329 Major depressive disorder, single episode, unspecified: Secondary | ICD-10-CM | POA: Diagnosis not present

## 2015-10-13 DIAGNOSIS — I10 Essential (primary) hypertension: Secondary | ICD-10-CM | POA: Diagnosis not present

## 2015-10-13 DIAGNOSIS — M1990 Unspecified osteoarthritis, unspecified site: Secondary | ICD-10-CM | POA: Diagnosis not present

## 2015-10-13 DIAGNOSIS — E039 Hypothyroidism, unspecified: Secondary | ICD-10-CM | POA: Diagnosis not present

## 2015-10-13 DIAGNOSIS — L89620 Pressure ulcer of left heel, unstageable: Secondary | ICD-10-CM | POA: Diagnosis not present

## 2015-10-21 ENCOUNTER — Ambulatory Visit: Payer: Self-pay | Admitting: Orthopedic Surgery

## 2015-11-01 ENCOUNTER — Encounter (HOSPITAL_COMMUNITY): Payer: Self-pay

## 2015-11-01 ENCOUNTER — Encounter (HOSPITAL_COMMUNITY)
Admission: RE | Admit: 2015-11-01 | Discharge: 2015-11-01 | Disposition: A | Discharge status: Home / Self Care | Payer: Medicare Other | Source: Ambulatory Visit | Attending: Orthopedic Surgery | Admitting: Orthopedic Surgery

## 2015-11-01 HISTORY — DX: Pneumonia, unspecified organism: J18.9

## 2015-11-01 HISTORY — DX: Anemia, unspecified: D64.9

## 2015-11-01 LAB — BASIC METABOLIC PANEL
ANION GAP: 11 (ref 5–15)
BUN: 43 mg/dL — ABNORMAL HIGH (ref 6–20)
CALCIUM: 9.1 mg/dL (ref 8.9–10.3)
CHLORIDE: 97 mmol/L — AB (ref 101–111)
CO2: 27 mmol/L (ref 22–32)
CREATININE: 1.29 mg/dL — AB (ref 0.44–1.00)
GFR calc non Af Amer: 41 mL/min — ABNORMAL LOW (ref >60)
GFR, EST AFRICAN AMERICAN: 47 mL/min — AB (ref >60)
Glucose, Bld: 554 mg/dL (ref 65–99)
Potassium: 5 mmol/L (ref 3.5–5.1)
SODIUM: 135 mmol/L (ref 135–145)

## 2015-11-01 LAB — CBC
HEMATOCRIT: 33.4 % — AB (ref 36.0–46.0)
Hemoglobin: 11 g/dL — ABNORMAL LOW (ref 12.0–15.0)
MCH: 26.8 pg (ref 26.0–34.0)
MCHC: 32.9 g/dL (ref 30.0–36.0)
MCV: 81.5 fL (ref 78.0–100.0)
Platelets: 173 10*3/uL (ref 150–400)
RBC: 4.1 MIL/uL (ref 3.87–5.11)
RDW: 15.4 % (ref 11.5–15.5)
WBC: 5.4 10*3/uL (ref 4.0–10.5)

## 2015-11-01 LAB — SURGICAL PCR SCREEN
MRSA, PCR: POSITIVE — AB
STAPHYLOCOCCUS AUREUS: POSITIVE — AB

## 2015-11-01 NOTE — Progress Notes (Signed)
Dr Joylene Draft did not return page.  Called [phone] and had Dr Joylene Draft paged again to [phone] regarding abnormal glucose results on patient.

## 2015-11-01 NOTE — Patient Instructions (Addendum)
BRIANNIE LAGOW  11/01/2015   Your procedure is scheduled on: 11/03/2015    Report to [facility]  Entrance take [place]  elevators to 3rd floor to  [place] at   [place] AM.  Call this number if you have problems the morning of surgery [phone]   Remember: ONLY 1 PERSON MAY GO WITH YOU TO SHORT STAY TO GET  READY MORNING OF [place].  Do not eat food or drink liquids :After Midnight.             Eat a good healthy snack prior to bedtime.       Take these medicines the morning of surgery with A SIP OF WATER:   Albuterol Inhaler if needed and bring, Nexium, Allegra if needed, Flonase if needed, Synthroid,  , Paxil, Blink tears if needed DO NOT TAKE ANY DIABETIC MEDICATIONS DAY OF YOUR SURGERY                               You may not have any metal on your body including hair pins and              piercings  Do not wear jewelry, make-up, lotions, powders or perfumes, deodorant             Do not wear nail polish.  Do not shave  48 hours prior to surgery.               Do not bring valuables to the hospital. [place].  Contacts, dentures or bridgework may not be worn into surgery.  Leave suitcase in the car. After surgery it may be brought to your room.         Special Instructions: coughing and deep breathing exercises, leg exercises               Please read over the following fact sheets you were given: _____________________________________________________________________             [facility] - Preparing for Surgery Before surgery, you can play an important role.  Because skin is not sterile, your skin needs to be as free of germs as possible.  You can reduce the number of germs on your skin by washing with CHG (chlorahexidine gluconate) soap before surgery.  CHG is an antiseptic cleaner which kills germs and bonds with the skin to continue killing germs even after washing. Please DO NOT  use if you have an allergy to CHG or antibacterial soaps.  If your skin becomes reddened/irritated stop using the CHG and inform your nurse when you arrive at Short Stay. Do not shave (including legs and underarms) for at least 48 hours prior to the first CHG shower.  You may shave your face/neck. Please follow these instructions carefully:  1.  Shower with CHG Soap the night before surgery and the  morning of Surgery.  2.  If you choose to wash your hair, wash your hair first as usual with your  normal  shampoo.  3.  After you shampoo, rinse your hair and body thoroughly to remove the  shampoo.  4.  Use CHG as you would any other liquid soap.  You can apply chg directly  to the skin and wash                       Gently with a scrungie or clean washcloth.  5.  Apply the CHG Soap to your body ONLY FROM THE NECK DOWN.   Do not use on face/ open                           Wound or open sores. Avoid contact with eyes, ears mouth and genitals (private parts).                       Wash face,  Genitals (private parts) with your normal soap.             6.  Wash thoroughly, paying special attention to the area where your surgery  will be performed.  7.  Thoroughly rinse your body with warm water from the neck down.  8.  DO NOT shower/wash with your normal soap after using and rinsing off  the CHG Soap.                9.  Pat yourself dry with a clean towel.            10.  Wear clean pajamas.            11.  Place clean sheets on your bed the night of your first shower and do not  sleep with pets. Day of Surgery : Do not apply any lotions/deodorants the morning of surgery.  Please wear clean clothes to the hospital/surgery center.  FAILURE TO FOLLOW THESE INSTRUCTIONS MAY RESULT IN THE CANCELLATION OF YOUR SURGERY PATIENT SIGNATURE_________________________________  NURSE  SIGNATURE__________________________________  ________________________________________________________________________   Adam Phenix  An incentive spirometer is a tool that can help keep your lungs clear and active. This tool measures how well you are filling your lungs with each breath. Taking long deep breaths may help reverse or decrease the chance of developing breathing (pulmonary) problems (especially infection) following:  A long period of time when you are unable to move or be active. BEFORE THE PROCEDURE   If the spirometer includes an indicator to show your best effort, your nurse or respiratory therapist will set it to a desired goal.  If possible, sit up straight or lean slightly forward. Try not to slouch.  Hold the incentive spirometer in an upright position. INSTRUCTIONS FOR USE  1. Sit on the edge of your bed if possible, or sit up as far as you can in bed or on a chair. 2. Hold the incentive spirometer in an upright position. 3. Breathe out normally. 4. Place the mouthpiece in your mouth and seal your lips tightly around it. 5. Breathe in slowly and as deeply as possible, raising the piston or the ball toward the top of the column. 6. Hold your breath for 3-5 seconds or for as long as possible. Allow the piston or ball to fall to the bottom of the column. 7. Remove the mouthpiece from your mouth and breathe out normally. 8. Rest for a few seconds and repeat Steps 1 through 7 at least 10 times every 1-2 hours when you are awake. Take your time and take a few normal breaths between deep breaths. 9. The spirometer may include an indicator to show  your best effort. Use the indicator as a goal to work toward during each repetition. 10. After each set of 10 deep breaths, practice coughing to be sure your lungs are clear. If you have an incision (the cut made at the time of surgery), support your incision when coughing by placing a pillow or rolled up towels firmly  against it. Once you are able to get out of bed, walk around indoors and cough well. You may stop using the incentive spirometer when instructed by your caregiver.  RISKS AND COMPLICATIONS  Take your time so you do not get dizzy or light-headed.  If you are in pain, you may need to take or ask for pain medication before doing incentive spirometry. It is harder to take a deep breath if you are having pain. AFTER USE  Rest and breathe slowly and easily.  It can be helpful to keep track of a log of your progress. Your caregiver can provide you with a simple table to help with this. If you are using the spirometer at home, follow these instructions: [place] IF:   You are having difficultly using the spirometer.  You have trouble using the spirometer as often as instructed.  Your pain medication is not giving enough relief while using the spirometer.  You develop fever of 100.5 F (38.1 C) or higher. SEEK IMMEDIATE MEDICAL CARE IF:   You cough up bloody sputum that had not been present before.  You develop fever of 102 F (38.9 C) or greater.  You develop worsening pain at or near the incision site. MAKE SURE YOU:   Understand these instructions.  Will watch your condition.  Will get help right away if you are not doing well or get worse. Document Released: 10/09/2006 Document Revised: 08/21/2011 Document Reviewed: 12/10/2006 [facility] Patient Information 2014 [place], [place].   ________________________________________________________________________

## 2015-11-01 NOTE — Progress Notes (Signed)
Called patient and left patient a voice mail message on phone number of [phone] regarding abnormal glucose result today of 554.  Asked patient to return call to know that she received message and Dr Wynelle Link is aware of glucose of 554 and instructed patient to go to the Emergency Room if she feels bad.

## 2015-11-01 NOTE — Progress Notes (Addendum)
Dr Joylene Draft returned call ( on call for Dr Forde Dandy) and made him aware glucose was 554 on 11/01/15 at preop appointment.  Dr Joylene Draft states patient is very Insulin Resistant. And she has missed last 3-4 visits at office of Dr Forde Dandy Dr Joylene Draft instructed me to call Dr Forde Dandy on 11/02/2015 with HGBA1C results .  Dr Joylene Draft aware that I have left 2 messages for patient to call me and have called her friend Jamelle Haring to make sure I had correct phone number for patient , which I do and Jamelle Haring to give patient a call also and ask her to call me.  Dr Orene Desanctis ( anesthesia) called and made him aware of above and he stated for me to let the anesthesiologist on 11/02/2015 be aware of above along with the HGBA1C results.  Dr Orene Desanctis instructed me that I did not have to call patient back any more.Marland Kitchen

## 2015-11-01 NOTE — Progress Notes (Addendum)
EKG-08/13/15- [facility]  06/21/15- [place]  08/13/15- 1V CXR- EPIC  LOV- 09/15/15- Cardiologty in [facility]

## 2015-11-01 NOTE — Progress Notes (Signed)
PCR screen results done 11/01/15 routed via EPIC to Dr Wynelle Link.

## 2015-11-01 NOTE — Progress Notes (Signed)
BMP done 11/01/15 faxed via EPIC to Dr Wynelle Link.

## 2015-11-01 NOTE — Progress Notes (Signed)
When reviewing medications with patient at preop appointment patient reports no longer taking Lopressor and it has been greater than 30 days since she has taken.  Removed from preop medication list.

## 2015-11-01 NOTE — Progress Notes (Signed)
Called and spoke with friend of patient - who is listed as emergency contact for patient - Jamelle Haring - she will call patient and have her call me at [phone].  The number for patient is the correct phone number per friend- [phone].  I have left another message for patient to call me.  Jamelle Haring - is aware that glucose was 554 on today's preop labs that is the reason I am trying to get in touch with patient.

## 2015-11-01 NOTE — Progress Notes (Addendum)
Made DR Massagee ( anesthesia) aware of Glucose results of 554 on BMP results from 11/01/15 and Dr Wynelle Link had been notified since critical value.  Order given to recheck glucose am of surgery on 11/03/2015.  Dr Orene Desanctis also requested that I notify PCP and Endocrinologist.  And to report back to the anesthesiologist on  Call either tonite or on 11/02/2015 of what I had found out.  Dr Orene Desanctis also aware that I have left a message for patient to call me.  I also placed a call to office of Dr Asencion Noble ( office gone for day ) who is listed as PCP for patient and called Dr Baldwin Crown office and had Dr Joylene Draft paged who is MD on call for Dr Forde Dandy who is listed as patient's endocrinologist.

## 2015-11-01 NOTE — Progress Notes (Addendum)
CRITICAL VALUE ALERT  Critical value received:  Glucose 554   Date of notification:  11/01/2015   Time of notification:  1551pm  Critical value read back:yes   Nurse who received alert:  Gillian Shields RN   MD notified (1st page):  Dr Wynelle Link   Time of first page:  1552 pm  MD notified (2nd page):not applicable   Time of second page:not applicable   Responding MD:  Dr Wynelle Link   Time MD responded:  1552pm

## 2015-11-02 LAB — HEMOGLOBIN A1C
HEMOGLOBIN A1C: 9.3 % — AB (ref 4.8–5.6)
MEAN PLASMA GLUCOSE: 220 mg/dL

## 2015-11-02 NOTE — Progress Notes (Signed)
HGBA1C done 11/01/15 routed via EPIC to Dr Reynold Bowen and to Dr Wynelle Link.

## 2015-11-02 NOTE — Progress Notes (Signed)
Faxed to [phone] to Dr Forde Dandy the [facility] and BMP results done 11/01/15.

## 2015-11-02 NOTE — Progress Notes (Signed)
Called office and they will deliver to Dr Wynelle Link( who is in office today ) message to please note the HGBA1C results done 11/01/2015 on patient.

## 2015-11-02 NOTE — Progress Notes (Signed)
Anesthesia ( Dr Conrad [place] and Dr Delma Post) aware HGBA1C result from 11/01/2015.

## 2015-11-02 NOTE — Progress Notes (Signed)
Spoke with patient by phone and patient stated she was fine.  I told her Dr Joylene Draft ( with Dr Forde Dandy) , Dr Forde Dandy and Dr Wynelle Link were all aware of lab results from 11/01/15.

## 2015-11-03 ENCOUNTER — Ambulatory Visit (HOSPITAL_COMMUNITY): Payer: Medicare Other | Admitting: Certified Registered Nurse Anesthetist

## 2015-11-03 ENCOUNTER — Inpatient Hospital Stay (HOSPITAL_COMMUNITY)
Admission: RE | Admit: 2015-11-03 | Discharge: 2015-11-05 | DRG: 623 | Disposition: A | Discharge status: Home Health | Payer: Medicare Other | Source: Ambulatory Visit | Attending: Orthopedic Surgery | Admitting: Orthopedic Surgery

## 2015-11-03 ENCOUNTER — Encounter (HOSPITAL_COMMUNITY): Payer: Self-pay | Admitting: *Deleted

## 2015-11-03 ENCOUNTER — Encounter (HOSPITAL_COMMUNITY)
Admission: RE | Disposition: A | Discharge status: Home Health | Payer: Self-pay | Source: Ambulatory Visit | Attending: Orthopedic Surgery

## 2015-11-03 DIAGNOSIS — Z886 Allergy status to analgesic agent status: Secondary | ICD-10-CM

## 2015-11-03 DIAGNOSIS — Z978 Presence of other specified devices: Secondary | ICD-10-CM | POA: Diagnosis not present

## 2015-11-03 DIAGNOSIS — Z882 Allergy status to sulfonamides status: Secondary | ICD-10-CM

## 2015-11-03 DIAGNOSIS — I10 Essential (primary) hypertension: Secondary | ICD-10-CM | POA: Diagnosis not present

## 2015-11-03 DIAGNOSIS — Z91040 Latex allergy status: Secondary | ICD-10-CM | POA: Diagnosis not present

## 2015-11-03 DIAGNOSIS — I5032 Chronic diastolic (congestive) heart failure: Secondary | ICD-10-CM | POA: Diagnosis present

## 2015-11-03 DIAGNOSIS — Z472 Encounter for removal of internal fixation device: Secondary | ICD-10-CM | POA: Diagnosis not present

## 2015-11-03 DIAGNOSIS — T84498A Other mechanical complication of other internal orthopedic devices, implants and grafts, initial encounter: Secondary | ICD-10-CM | POA: Diagnosis not present

## 2015-11-03 DIAGNOSIS — Z881 Allergy status to other antibiotic agents status: Secondary | ICD-10-CM

## 2015-11-03 DIAGNOSIS — E11621 Type 2 diabetes mellitus with foot ulcer: Secondary | ICD-10-CM | POA: Diagnosis present

## 2015-11-03 DIAGNOSIS — S92035D Nondisplaced avulsion fracture of tuberosity of left calcaneus, subsequent encounter for fracture with routine healing: Secondary | ICD-10-CM | POA: Diagnosis not present

## 2015-11-03 DIAGNOSIS — I110 Hypertensive heart disease with heart failure: Secondary | ICD-10-CM | POA: Diagnosis present

## 2015-11-03 DIAGNOSIS — L97529 Non-pressure chronic ulcer of other part of left foot with unspecified severity: Secondary | ICD-10-CM | POA: Diagnosis not present

## 2015-11-03 DIAGNOSIS — K219 Gastro-esophageal reflux disease without esophagitis: Secondary | ICD-10-CM | POA: Diagnosis present

## 2015-11-03 DIAGNOSIS — Z9109 Other allergy status, other than to drugs and biological substances: Secondary | ICD-10-CM

## 2015-11-03 DIAGNOSIS — E039 Hypothyroidism, unspecified: Secondary | ICD-10-CM | POA: Diagnosis present

## 2015-11-03 DIAGNOSIS — L97429 Non-pressure chronic ulcer of left heel and midfoot with unspecified severity: Secondary | ICD-10-CM | POA: Diagnosis present

## 2015-11-03 DIAGNOSIS — E785 Hyperlipidemia, unspecified: Secondary | ICD-10-CM | POA: Diagnosis present

## 2015-11-03 HISTORY — PX: INCISION AND DRAINAGE OF WOUND: SHX1803

## 2015-11-03 HISTORY — PX: HARDWARE REMOVAL: SHX979

## 2015-11-03 LAB — CBC
HCT: 29.2 % — ABNORMAL LOW (ref 36.0–46.0)
HEMOGLOBIN: 9.8 g/dL — AB (ref 12.0–15.0)
MCH: 27.3 pg (ref 26.0–34.0)
MCHC: 33.6 g/dL (ref 30.0–36.0)
MCV: 81.3 fL (ref 78.0–100.0)
PLATELETS: 148 10*3/uL — AB (ref 150–400)
RBC: 3.59 MIL/uL — AB (ref 3.87–5.11)
RDW: 15.3 % (ref 11.5–15.5)
WBC: 5.4 10*3/uL (ref 4.0–10.5)

## 2015-11-03 LAB — GLUCOSE, CAPILLARY
GLUCOSE-CAPILLARY: 151 mg/dL — AB (ref 65–99)
GLUCOSE-CAPILLARY: 231 mg/dL — AB (ref 65–99)
GLUCOSE-CAPILLARY: 269 mg/dL — AB (ref 65–99)
GLUCOSE-CAPILLARY: 464 mg/dL — AB (ref 65–99)
GLUCOSE-CAPILLARY: 485 mg/dL — AB (ref 65–99)
Glucose-Capillary: 177 mg/dL — ABNORMAL HIGH (ref 65–99)
Glucose-Capillary: 475 mg/dL — ABNORMAL HIGH (ref 65–99)
Glucose-Capillary: 569 mg/dL (ref 65–99)

## 2015-11-03 LAB — CREATININE, SERUM
CREATININE: 1.3 mg/dL — AB (ref 0.44–1.00)
GFR calc Af Amer: 47 mL/min — ABNORMAL LOW (ref >60)
GFR, EST NON AFRICAN AMERICAN: 40 mL/min — AB (ref >60)

## 2015-11-03 SURGERY — IRRIGATION AND DEBRIDEMENT WOUND
Anesthesia: General | Site: Foot | Laterality: Left

## 2015-11-03 MED ORDER — LEVOTHYROXINE SODIUM 125 MCG PO TABS: 187.5000 ug | ORAL_TABLET | ORAL | Status: DC

## 2015-11-03 MED ORDER — ROCURONIUM BROMIDE 100 MG/10ML IV SOLN
INTRAVENOUS | Status: AC
  Filled 2015-11-03: qty 1

## 2015-11-03 MED ORDER — FENTANYL CITRATE (PF) 100 MCG/2ML IJ SOLN: 25.0000 ug | INTRAMUSCULAR | Status: DC | PRN

## 2015-11-03 MED ORDER — PROCHLORPERAZINE EDISYLATE 5 MG/ML IJ SOLN: 10.0000 mg | Freq: Once | INTRAMUSCULAR | Status: DC | PRN

## 2015-11-03 MED ORDER — SIMVASTATIN 20 MG PO TABS
20.0000 mg | ORAL_TABLET | Freq: Every day | ORAL | Status: DC
  Administered 2015-11-03 – 2015-11-04 (×2): 20 mg via ORAL
  Filled 2015-11-03 (×2): qty 1

## 2015-11-03 MED ORDER — FENTANYL CITRATE (PF) 100 MCG/2ML IJ SOLN
INTRAMUSCULAR | Status: DC | PRN
  Administered 2015-11-03 (×2): 50 ug via INTRAVENOUS

## 2015-11-03 MED ORDER — ACETAMINOPHEN 325 MG PO TABS: 650.0000 mg | ORAL_TABLET | Freq: Four times a day (QID) | ORAL | Status: DC | PRN

## 2015-11-03 MED ORDER — ONDANSETRON HCL 4 MG/2ML IJ SOLN: 4.0000 mg | Freq: Four times a day (QID) | INTRAMUSCULAR | Status: DC | PRN

## 2015-11-03 MED ORDER — FLUTICASONE PROPIONATE 50 MCG/ACT NA SUSP: 2.0000 | Freq: Two times a day (BID) | NASAL | Status: DC | PRN

## 2015-11-03 MED ORDER — METOCLOPRAMIDE HCL 5 MG/ML IJ SOLN
INTRAMUSCULAR | Status: DC | PRN
  Administered 2015-11-03: 10 mg via INTRAVENOUS

## 2015-11-03 MED ORDER — FENTANYL CITRATE (PF) 100 MCG/2ML IJ SOLN
INTRAMUSCULAR | Status: DC
Start: 2015-11-03 — End: 2015-11-03
  Filled 2015-11-03: qty 2

## 2015-11-03 MED ORDER — LEVOTHYROXINE SODIUM 125 MCG PO TABS
125.0000 ug | ORAL_TABLET | ORAL | Status: DC
  Administered 2015-11-04 – 2015-11-05 (×2): 125 ug via ORAL
  Filled 2015-11-03 (×2): qty 1

## 2015-11-03 MED ORDER — MORPHINE SULFATE (PF) 2 MG/ML IV SOLN
1.0000 mg | INTRAVENOUS | Status: DC | PRN
  Administered 2015-11-03: 1 mg via INTRAVENOUS
  Filled 2015-11-03: qty 1

## 2015-11-03 MED ORDER — PROPOFOL 10 MG/ML IV BOLUS
INTRAVENOUS | Status: DC | PRN
  Administered 2015-11-03: 200 mg via INTRAVENOUS

## 2015-11-03 MED ORDER — PANTOPRAZOLE SODIUM 40 MG PO TBEC: 40.0000 mg | DELAYED_RELEASE_TABLET | Freq: Every day | ORAL | Status: DC

## 2015-11-03 MED ORDER — ACETAMINOPHEN 650 MG RE SUPP: 650.0000 mg | Freq: Four times a day (QID) | RECTAL | Status: DC | PRN

## 2015-11-03 MED ORDER — LIDOCAINE HCL (CARDIAC) 20 MG/ML IV SOLN
INTRAVENOUS | Status: AC
  Filled 2015-11-03: qty 5

## 2015-11-03 MED ORDER — SODIUM CHLORIDE 0.9 % IV SOLN
INTRAVENOUS | Status: DC
  Administered 2015-11-03 – 2015-11-04 (×3): via INTRAVENOUS

## 2015-11-03 MED ORDER — VANCOMYCIN HCL IN DEXTROSE 1-5 GM/200ML-% IV SOLN
INTRAVENOUS | Status: AC
  Filled 2015-11-03: qty 200

## 2015-11-03 MED ORDER — ACETAMINOPHEN 10 MG/ML IV SOLN
INTRAVENOUS | Status: AC
  Filled 2015-11-03: qty 100

## 2015-11-03 MED ORDER — METOCLOPRAMIDE HCL 5 MG/ML IJ SOLN: 5.0000 mg | Freq: Three times a day (TID) | INTRAMUSCULAR | Status: DC | PRN

## 2015-11-03 MED ORDER — PROPOFOL 10 MG/ML IV BOLUS
INTRAVENOUS | Status: AC
  Filled 2015-11-03: qty 20

## 2015-11-03 MED ORDER — FENTANYL CITRATE (PF) 100 MCG/2ML IJ SOLN
INTRAMUSCULAR | Status: AC
  Filled 2015-11-03: qty 2

## 2015-11-03 MED ORDER — POVIDONE-IODINE 10 % EX SWAB: 2.0000 "application " | Freq: Once | CUTANEOUS | Status: DC

## 2015-11-03 MED ORDER — ACETAMINOPHEN 10 MG/ML IV SOLN
1000.0000 mg | Freq: Once | INTRAVENOUS | Status: AC
  Administered 2015-11-03: 1000 mg via INTRAVENOUS

## 2015-11-03 MED ORDER — ACETAMINOPHEN 500 MG PO TABS
1000.0000 mg | ORAL_TABLET | Freq: Four times a day (QID) | ORAL | Status: AC
  Administered 2015-11-03 – 2015-11-04 (×4): 1000 mg via ORAL
  Filled 2015-11-03 (×4): qty 2

## 2015-11-03 MED ORDER — CEFAZOLIN SODIUM-DEXTROSE 2-4 GM/100ML-% IV SOLN
2.0000 g | INTRAVENOUS | Status: AC
  Administered 2015-11-03: 2 g via INTRAVENOUS
  Filled 2015-11-03: qty 100

## 2015-11-03 MED ORDER — METHOCARBAMOL 500 MG PO TABS
500.0000 mg | ORAL_TABLET | Freq: Four times a day (QID) | ORAL | Status: DC | PRN
  Administered 2015-11-04 – 2015-11-05 (×2): 500 mg via ORAL
  Filled 2015-11-03 (×2): qty 1

## 2015-11-03 MED ORDER — METOCLOPRAMIDE HCL 5 MG/ML IJ SOLN
INTRAMUSCULAR | Status: AC
  Filled 2015-11-03: qty 2

## 2015-11-03 MED ORDER — CHLORHEXIDINE GLUCONATE 4 % EX LIQD: 60.0000 mL | Freq: Once | CUTANEOUS | Status: DC

## 2015-11-03 MED ORDER — DEXAMETHASONE SODIUM PHOSPHATE 4 MG/ML IJ SOLN
INTRAMUSCULAR | Status: DC | PRN
  Administered 2015-11-03: 4 mg via INTRAVENOUS

## 2015-11-03 MED ORDER — PAROXETINE HCL 20 MG PO TABS
40.0000 mg | ORAL_TABLET | Freq: Every day | ORAL | Status: DC
  Administered 2015-11-04 – 2015-11-05 (×2): 40 mg via ORAL
  Filled 2015-11-03 (×3): qty 2

## 2015-11-03 MED ORDER — VANCOMYCIN HCL IN DEXTROSE 1-5 GM/200ML-% IV SOLN
1000.0000 mg | Freq: Two times a day (BID) | INTRAVENOUS | Status: DC
  Administered 2015-11-03: 1000 mg via INTRAVENOUS
  Filled 2015-11-03 (×2): qty 200

## 2015-11-03 MED ORDER — SUGAMMADEX SODIUM 200 MG/2ML IV SOLN
INTRAVENOUS | Status: DC | PRN
  Administered 2015-11-03: 200 mg via INTRAVENOUS

## 2015-11-03 MED ORDER — LACTATED RINGERS IV SOLN
INTRAVENOUS | Status: DC | PRN
  Administered 2015-11-03: 11:00:00 via INTRAVENOUS

## 2015-11-03 MED ORDER — DEXAMETHASONE SODIUM PHOSPHATE 10 MG/ML IJ SOLN: 10.0000 mg | Freq: Once | INTRAMUSCULAR | Status: DC

## 2015-11-03 MED ORDER — SODIUM CHLORIDE 0.9 % IV SOLN: INTRAVENOUS | Status: DC

## 2015-11-03 MED ORDER — 0.9 % SODIUM CHLORIDE (POUR BTL) OPTIME
TOPICAL | Status: DC | PRN
  Administered 2015-11-03: 1000 mL

## 2015-11-03 MED ORDER — ONDANSETRON HCL 4 MG/2ML IJ SOLN
INTRAMUSCULAR | Status: DC | PRN
  Administered 2015-11-03: 4 mg via INTRAVENOUS

## 2015-11-03 MED ORDER — POTASSIUM CHLORIDE ER 10 MEQ PO TBCR
10.0000 meq | EXTENDED_RELEASE_TABLET | Freq: Every day | ORAL | Status: DC
  Administered 2015-11-04 – 2015-11-05 (×2): 10 meq via ORAL
  Filled 2015-11-03 (×4): qty 1

## 2015-11-03 MED ORDER — ONDANSETRON HCL 4 MG PO TABS: 4.0000 mg | ORAL_TABLET | Freq: Four times a day (QID) | ORAL | Status: DC | PRN

## 2015-11-03 MED ORDER — INSULIN ASPART 100 UNIT/ML ~~LOC~~ SOLN
5.0000 [IU] | Freq: Once | SUBCUTANEOUS | Status: AC
  Administered 2015-11-03: 5 [IU] via SUBCUTANEOUS

## 2015-11-03 MED ORDER — FUROSEMIDE 20 MG PO TABS
20.0000 mg | ORAL_TABLET | Freq: Every day | ORAL | Status: DC
  Administered 2015-11-04 – 2015-11-05 (×2): 20 mg via ORAL
  Filled 2015-11-03 (×2): qty 1

## 2015-11-03 MED ORDER — INSULIN ASPART 100 UNIT/ML ~~LOC~~ SOLN
SUBCUTANEOUS | Status: AC
  Filled 2015-11-03: qty 1

## 2015-11-03 MED ORDER — VANCOMYCIN HCL IN DEXTROSE 1-5 GM/200ML-% IV SOLN
1000.0000 mg | Freq: Once | INTRAVENOUS | Status: AC
  Administered 2015-11-03: 1000 mg via INTRAVENOUS

## 2015-11-03 MED ORDER — METHOCARBAMOL 1000 MG/10ML IJ SOLN
500.0000 mg | Freq: Four times a day (QID) | INTRAVENOUS | Status: DC | PRN
  Filled 2015-11-03: qty 5

## 2015-11-03 MED ORDER — ENOXAPARIN SODIUM 40 MG/0.4ML ~~LOC~~ SOLN
40.0000 mg | SUBCUTANEOUS | Status: DC
  Administered 2015-11-04 – 2015-11-05 (×2): 40 mg via SUBCUTANEOUS
  Filled 2015-11-03 (×2): qty 0.4

## 2015-11-03 MED ORDER — INSULIN REGULAR HUMAN (CONC) 500 UNIT/ML ~~LOC~~ SOLN: 5.0000 [IU] | Freq: Three times a day (TID) | SUBCUTANEOUS | Status: DC

## 2015-11-03 MED ORDER — LIDOCAINE HCL (CARDIAC) 20 MG/ML IV SOLN
INTRAVENOUS | Status: DC | PRN
  Administered 2015-11-03: 50 mg via INTRAVENOUS

## 2015-11-03 MED ORDER — METOCLOPRAMIDE HCL 5 MG PO TABS: 5.0000 mg | ORAL_TABLET | Freq: Three times a day (TID) | ORAL | Status: DC | PRN

## 2015-11-03 MED ORDER — TORSEMIDE 20 MG PO TABS
20.0000 mg | ORAL_TABLET | Freq: Every day | ORAL | Status: DC
  Administered 2015-11-04: 20 mg via ORAL
  Filled 2015-11-03 (×3): qty 1

## 2015-11-03 MED ORDER — ALBUTEROL SULFATE (2.5 MG/3ML) 0.083% IN NEBU: 2.5000 mg | INHALATION_SOLUTION | RESPIRATORY_TRACT | Status: DC | PRN

## 2015-11-03 MED ORDER — OXYCODONE HCL 5 MG PO TABS
5.0000 mg | ORAL_TABLET | ORAL | Status: DC | PRN
  Filled 2015-11-03: qty 2

## 2015-11-03 MED ORDER — ONDANSETRON HCL 4 MG/2ML IJ SOLN
INTRAMUSCULAR | Status: AC
  Filled 2015-11-03: qty 2

## 2015-11-03 MED ORDER — LORATADINE 10 MG PO TABS: 10.0000 mg | ORAL_TABLET | Freq: Every day | ORAL | Status: DC | PRN

## 2015-11-03 MED ORDER — INSULIN ASPART 100 UNIT/ML ~~LOC~~ SOLN: 0.0000 [IU] | Freq: Three times a day (TID) | SUBCUTANEOUS | Status: DC

## 2015-11-03 MED ORDER — ROCURONIUM BROMIDE 100 MG/10ML IV SOLN
INTRAVENOUS | Status: DC | PRN
  Administered 2015-11-03: 40 mg via INTRAVENOUS

## 2015-11-03 MED ORDER — CEFAZOLIN SODIUM-DEXTROSE 2-4 GM/100ML-% IV SOLN
INTRAVENOUS | Status: AC
  Filled 2015-11-03: qty 100

## 2015-11-03 MED ORDER — DEXAMETHASONE SODIUM PHOSPHATE 10 MG/ML IJ SOLN
INTRAMUSCULAR | Status: AC
  Filled 2015-11-03: qty 1

## 2015-11-03 MED ORDER — INSULIN REGULAR HUMAN (CONC) 500 UNIT/ML ~~LOC~~ SOPN
25.0000 [IU] | PEN_INJECTOR | Freq: Three times a day (TID) | SUBCUTANEOUS | Status: DC
  Administered 2015-11-03: 35 [IU] via SUBCUTANEOUS
  Administered 2015-11-04: 10 [IU] via SUBCUTANEOUS
  Filled 2015-11-03: qty 3

## 2015-11-03 SURGICAL SUPPLY — 39 items
BANDAGE ACE 6X5 VEL STRL LF (GAUZE/BANDAGES/DRESSINGS) ×2 IMPLANT
BANDAGE ESMARK 6X9 LF (GAUZE/BANDAGES/DRESSINGS) ×1 IMPLANT
BNDG ESMARK 6X9 LF (GAUZE/BANDAGES/DRESSINGS) ×2
CUFF TOURN SGL QUICK 18 (TOURNIQUET CUFF) IMPLANT
CUFF TOURN SGL QUICK 34 (TOURNIQUET CUFF)
CUFF TRNQT CYL 34X4X40X1 (TOURNIQUET CUFF) IMPLANT
DRAPE C-ARM 42X120 X-RAY (DRAPES) ×2 IMPLANT
DRAPE C-ARMOR (DRAPES) ×2 IMPLANT
DRAPE EXTREMITY T 121X128X90 (DRAPE) ×2 IMPLANT
DRAPE INCISE IOBAN 66X45 STRL (DRAPES) ×2 IMPLANT
DRAPE ORTHO SPLIT 77X108 STRL (DRAPES)
DRAPE SURG ORHT 6 SPLT 77X108 (DRAPES) IMPLANT
DRSG ADAPTIC 3X8 NADH LF (GAUZE/BANDAGES/DRESSINGS) ×2 IMPLANT
DRSG PAD ABDOMINAL 8X10 ST (GAUZE/BANDAGES/DRESSINGS) ×2 IMPLANT
DURAPREP 26ML APPLICATOR (WOUND CARE) ×2 IMPLANT
ELECT REM PT RETURN 9FT ADLT (ELECTROSURGICAL) ×2
ELECTRODE REM PT RTRN 9FT ADLT (ELECTROSURGICAL) ×1 IMPLANT
GAUZE SPONGE 4X4 12PLY STRL (GAUZE/BANDAGES/DRESSINGS) ×2 IMPLANT
GLOVE BIO SURGEON STRL SZ7.5 (GLOVE) ×2 IMPLANT
GLOVE BIO SURGEON STRL SZ8 (GLOVE) ×4 IMPLANT
GLOVE BIOGEL PI IND STRL 8 (GLOVE) ×3 IMPLANT
GLOVE BIOGEL PI INDICATOR 8 (GLOVE) ×3
GOWN STRL REUS W/TWL LRG LVL3 (GOWN DISPOSABLE) ×2 IMPLANT
GOWN STRL REUS W/TWL XL LVL3 (GOWN DISPOSABLE) ×2 IMPLANT
KIT BASIN OR (CUSTOM PROCEDURE TRAY) ×2 IMPLANT
MANIFOLD NEPTUNE II (INSTRUMENTS) ×2 IMPLANT
NS IRRIG 1000ML POUR BTL (IV SOLUTION) ×2 IMPLANT
PACK TOTAL JOINT (CUSTOM PROCEDURE TRAY) ×2 IMPLANT
PADDING CAST COTTON 6X4 STRL (CAST SUPPLIES) ×2 IMPLANT
POSITIONER SURGICAL ARM (MISCELLANEOUS) ×2 IMPLANT
STAPLER VISISTAT 35W (STAPLE) IMPLANT
STRIP CLOSURE SKIN 1/2X4 (GAUZE/BANDAGES/DRESSINGS) ×2 IMPLANT
SUT MNCRL AB 4-0 PS2 18 (SUTURE) ×2 IMPLANT
SUT VIC AB 0 CT1 36 (SUTURE) ×4 IMPLANT
SUT VIC AB 2-0 CT1 27 (SUTURE) ×2
SUT VIC AB 2-0 CT1 TAPERPNT 27 (SUTURE) ×2 IMPLANT
TOWEL OR 17X26 10 PK STRL BLUE (TOWEL DISPOSABLE) ×4 IMPLANT
UNDERPAD 30X30 INCONTINENT (UNDERPADS AND DIAPERS) ×2 IMPLANT
WATER STERILE IRR 1500ML POUR (IV SOLUTION) ×2 IMPLANT

## 2015-11-03 NOTE — Anesthesia Postprocedure Evaluation (Signed)
Anesthesia Post Note  Patient: Jaclyn Herrera  Procedure(s) Performed: Procedure(s) (LRB): IRRIGATION AND DEBRIDEMENT LEFT FOOT ULCER (Left) HARDWARE REMOVAL LEFT FOOT appliction of wound vac (Left)  Patient location during evaluation: PACU Anesthesia Type: General Level of consciousness: awake and alert Pain management: pain level controlled Vital Signs Assessment: post-procedure vital signs reviewed and stable Respiratory status: spontaneous breathing, nonlabored ventilation, respiratory function stable and patient connected to nasal cannula oxygen Cardiovascular status: blood pressure returned to baseline and stable Postop Assessment: no signs of nausea or vomiting Anesthetic complications: no    Last Vitals:  Filed Vitals:   11/03/15 1400 11/03/15 1415  BP: 137/59 163/57  Pulse: 80 81  Temp: 36.9 C   Resp: 16 16    Last Pain:  Filed Vitals:   11/03/15 1419  PainSc: 3                  [name] J

## 2015-11-03 NOTE — H&P (Signed)
Jaclyn Herrera is an 70 y.o. female.   Chief Complaint: Left foot ulcer HPI: Jaclyn Herrera is a 70 yo female who had ORIF of a left calcaneus fracture several months ago She healed the bone uneventfully but has had a persistent draining wound on her left heel overlying the screw heads. We have done several months of dressing changes but the wound will not heal. She presents now for hardware removal, irrigation and debridement and wound closure vs. VAC placement  Past Medical History  Diagnosis Date  . Diabetes mellitus   . Hypertension   . Arthritis   . Shingles   . hypothyroidism   . Bronchitis   . Hyperlipidemia   . GERD (gastroesophageal reflux disease)   . Headache   . CHF (congestive heart failure) ([place])   . DKA (diabetic ketoacidoses) ([place]) 09/27/2014  . Chronic diastolic heart failure ([place]) 07/13/2015  . Hypothyroid 10/31/2014  . Vertigo 10/31/2014  . Syncope 10/31/2014  . Pneumonia     hx of   . 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 PONV (postoperative nausea and vomiting)     and slow to wake up after foot surgery     Past Surgical History  Procedure Laterality Date  . Cholecystectomy    . Ankle surgery      x 4  . Non cancerous mass removed      small intestine  . Tonsillectomy    . Nasal sinus surgery    . Cataract extraction Bilateral   . Appendectomy    . Knee arthroscopy    . Orif calcaneous fracture Left 12/29/2014    Procedure: OPEN REDUCTION INTERNAL FIXATION  LEFT CALCANEOUS FRACTURE;  Surgeon: Gaynelle Arabian, MD;  Location: [facility];  Service: Orthopedics;  Laterality: Left;    Family History  Problem Relation Age of Onset  . Diabetes Mother   . Asthma Mother   . Hypertension Father   . Heart disease Father   . Diabetes Father   . Heart disease Sister   . Hypertension Sister   . Asthma Sister   . Diabetes Brother   . CAD Brother   . Stroke Brother    Social History:  reports that she has never smoked. She has never used smokeless  tobacco. She reports that she does not drink alcohol or use illicit drugs.  Allergies:  Allergies  Allergen Reactions  . Sulfonamide Derivatives Anaphylaxis  . Bactrim [Sulfamethoxazole-Trimethoprim] Nausea Only  . Erythromycin Nausea And Vomiting  . Latex Itching and Other (See Comments)    If she wears gloves too long her hands start itching  . Mucinex [Guaifenesin Er] Nausea And Vomiting  . Aspirin Rash  . Levofloxacin Rash  . Tape Rash    No prescriptions prior to admission    Results for orders placed or performed during the hospital encounter of 11/01/15 (from the past 48 hour(s))  Surgical pcr screen     Status: Abnormal   Collection Time: 11/01/15  2:10 PM  Result Value Ref Range   MRSA, PCR POSITIVE (A) NEGATIVE    Comment: AFTERHOURS   Staphylococcus aureus POSITIVE (A) NEGATIVE    Comment:        The Xpert SA Assay (FDA approved for NASAL specimens in patients over 58 years of age), is one component of a comprehensive surveillance program.  Test performance has been validated by [facility] for patients greater than or equal to 64 year old. It  is not intended to diagnose infection nor to guide or monitor treatment.   CBC     Status: Abnormal   Collection Time: 11/01/15  2:15 PM  Result Value Ref Range   WBC 5.4 4.0 - 10.5 K/uL   RBC 4.10 3.87 - 5.11 MIL/uL   Hemoglobin 11.0 (L) 12.0 - 15.0 g/dL   HCT 33.4 (L) 36.0 - 46.0 %   MCV 81.5 78.0 - 100.0 fL   MCH 26.8 26.0 - 34.0 pg   MCHC 32.9 30.0 - 36.0 g/dL   RDW 15.4 11.5 - 15.5 %   Platelets 173 150 - 400 K/uL  Basic metabolic panel     Status: Abnormal   Collection Time: 11/01/15  2:15 PM  Result Value Ref Range   Sodium 135 135 - 145 mmol/L   Potassium 5.0 3.5 - 5.1 mmol/L   Chloride 97 (L) 101 - 111 mmol/L   CO2 27 22 - 32 mmol/L   Glucose, Bld 554 (HH) 65 - 99 mg/dL    Comment: CRITICAL RESULT CALLED TO, READ BACK BY AND VERIFIED WITH: PHILLIPS,C. RN _0  ON 5.22.17 BY MCCOY,N.    BUN 43 (H)  6 - 20 mg/dL   Creatinine, Ser 1.29 (H) 0.44 - 1.00 mg/dL   Calcium 9.1 8.9 - 10.3 mg/dL   GFR calc non Af Amer 41 (L) >60 mL/min   GFR calc Af Amer 47 (L) >60 mL/min    Comment: (NOTE) The eGFR has been calculated using the CKD EPI equation. This calculation has not been validated in all clinical situations. eGFR's persistently <60 mL/min signify possible Chronic Kidney Disease.    Anion gap 11 5 - 15  Hemoglobin A1c     Status: Abnormal   Collection Time: 11/01/15  2:15 PM  Result Value Ref Range   Hgb A1c MFr Bld 9.3 (H) 4.8 - 5.6 %    Comment: (NOTE)         Pre-diabetes: 5.7 - 6.4         Diabetes: >6.4         Glycemic control for adults with diabetes: <7.0    Mean Plasma Glucose 220 mg/dL    Comment: (NOTE) Performed At: [facility] [place], [place] [id number] Lindon Romp MD BO:[id number]    No results found.  ROS  There were no vitals taken for this visit. Physical Exam Physical Examination: General appearance - alert, well appearing, and in no distress Mental status - alert, oriented to person, place, and time Chest - clear to auscultation, no wheezes, rales or rhonchi, symmetric air entry Heart - normal rate, regular rhythm, normal S1, S2, no murmurs, rubs, clicks or gallops Abdomen - soft, nontender, nondistended, no masses or organomegaly Neurological - alert, oriented, normal speech, no focal findings or movement disorder noted Left heel with 1 x 2 cm ulcer with visible screw head. No purulence or erythema  Assessment/Plan Left heel ulcer with retained hardware- Plan hardware removal with I and D of wound and possible VAC placement.  Gearlean Alf, MD 11/03/2015, 7:15 AM

## 2015-11-03 NOTE — Interval H&P Note (Signed)
History and Physical Interval Note:  11/03/2015 11:10 AM  Jaclyn Herrera  has presented today for surgery, with the diagnosis of left calcaneous retained hardware and heel ulcer  The various methods of treatment have been discussed with the patient and family. After consideration of risks, benefits and other options for treatment, the patient has consented to  Procedure(s): IRRIGATION AND DEBRIDEMENT LEFT FOOT ULCER (Left) HARDWARE REMOVAL LEFT FOOT (Left) as a surgical intervention .  The patient's history has been reviewed, patient examined, no change in status, stable for surgery.  I have reviewed the patient's chart and labs.  Questions were answered to the patient's satisfaction.     Gearlean Alf

## 2015-11-03 NOTE — Op Note (Signed)
Jaclyn Herrera, Jaclyn Herrera                ACCOUNT NO.:  [id number]  MEDICAL RECORD NO.:  [id number]  LOCATION:  [id number]                         FACILITY:  [facility]  PHYSICIAN:  Gaynelle Arabian, M.D.    DATE OF BIRTH:  06-18-1945  DATE OF PROCEDURE:  11/03/2015 DATE OF DISCHARGE:                              OPERATIVE REPORT   PREOPERATIVE DIAGNOSIS:  Retained hardware, left calcaneus with left heel ulcer.  POSTOPERATIVE DIAGNOSIS:  Retained hardware, left calcaneus with left heel ulcer.  PROCEDURE:  Irrigation and debridement of left heel wound with hardware removal and placement of VAC dressing.  SURGEON:  Gaynelle Arabian, MD  ASSISTANT:  Alexzandrew L. Dara Lords, PA-C  ANESTHESIA:  General.  ESTIMATED BLOOD LOSS:  Minimal.  DRAIN:  VAC dressing.  COMPLICATIONS:  None.  CONDITION:  Stable to recovery.  BRIEF CLINICAL NOTE:  [name] is a 70 year old female, diabetic who had a calcaneus fracture several months ago.  She healed the bone uneventfully, but then developed a left heel ulcer.  It has not healed with the dressing changes and has had persistent scant drainage.  She presents now for hardware removal and debridement of the ulcer with possible VAC placement.  PROCEDURE IN DETAIL:  After successful administration of general anesthetic, the patient was placed in the prone position with body prominences well padded.  Her left leg is isolated from her thigh with plastic drapes and then prepped and draped in the usual sterile fashion. I did a racquet-type incision, ellipsing the nonviable tissue overlying the posterior aspect of the heel.  There was about 1 x 2 cm ellipsed area which was cut with a knife through skin down to bone.  I extended the incision distally in vertical fashion to gain better exposure of the screws.  The screws are identified and the screws and washers removed. The bone had fully healed.  Bone quality looked normal.  Any nonviable tissue was debrided from the  wound bed.  I then thoroughly irrigated with saline solution. Minor skin bleeders were stopped with electrocautery.  I was satisfied with the appearance of the tissue and placed a sponge for wound VAC, and then placed a VAC dressing.  We hooked it up to suction and it had an excellent seal.  She was then placed into a bulky dressing, awakened and transported to recovery in stable condition.     Gaynelle Arabian, M.D.     FA/MEDQ  D:  11/03/2015  T:  11/03/2015  Job:  [id number]

## 2015-11-03 NOTE — Progress Notes (Signed)
Pharmacy Home Medication Reconciliation Communication High Risk Medication: Humulin U-500 Insulin  Home dose of Humulin U-500 insulin was reordered for this patient. The dose was verified with the patient as listed below:  Type of syringe used at home:  U100 syringe (30 unit syringe per patient) Number or line on syringe to which patient draws up insulin: typically 5-7 units, unable to give me exact cut-off for CBG values and insulin dosing   This equates to 25-35 units of U-500 insulin  Other comments pertinent to patient home dosing:  Jaclyn Herrera 11/03/2015  4:02 PM

## 2015-11-03 NOTE — Transfer of Care (Signed)
Immediate Anesthesia Transfer of Care Note  Patient: Jaclyn Herrera  Procedure(s) Performed: Procedure(s): IRRIGATION AND DEBRIDEMENT LEFT FOOT ULCER (Left) HARDWARE REMOVAL LEFT FOOT (Left)  Patient Location: PACU  Anesthesia Type:General  Level of Consciousness: awake, alert , oriented and patient cooperative  Airway & Oxygen Therapy: Patient Spontanous Breathing and Patient connected to face mask oxygen  Post-op Assessment: Report given to RN, Post -op Vital signs reviewed and stable and Patient moving all extremities  Post vital signs: Reviewed and stable  Last Vitals:  Filed Vitals:   11/03/15 0934  BP: 174/74  Pulse: 84  Temp: 36.8 C  Resp: 18    Last Pain: There were no vitals filed for this visit.    Patients Stated Pain Goal: 5 ([id number])  Complications: No apparent anesthesia complications

## 2015-11-03 NOTE — Progress Notes (Signed)
Recheck of FSBS at 1400 is 269 after pt received 5 units of Novolog in PACU. Pt concerned about her home dose of  Humulin-R (U-500) was not ordered to cover her FSBS. Discussed with Dr Wynelle Link about FSBS and medication regimen, MD said he would place in orders for patients home dose of Humulin-R. Pharmacist in at bedside to confirm home insulin dose with patient. Awaiting the dose from pharmacy, [facility] on 6th floor aware of all the above in report.

## 2015-11-03 NOTE — Progress Notes (Signed)
Per Dr. Wynelle Link, do not administer sliding scale since patient is receiving Humilin 500 (gave 35 units).

## 2015-11-03 NOTE — Anesthesia Preprocedure Evaluation (Addendum)
Anesthesia Evaluation  Patient identified by MRN, date of birth, ID band Patient awake  General Assessment Comment:Past Medical History Diagnosis Date . Diabetes mellitus  . Hypertension  . Arthritis  . Shingles  . hypothyroidism  . Bronchitis  . Hyperlipidemia  . GERD (gastroesophageal reflux disease)  . Headache  . CHF (congestive heart failure) ([place])  . DKA (diabetic ketoacidoses) ([place]) 09/27/2014 . Chronic diastolic heart failure ([place]) 07/13/2015 . Hypothyroid 10/31/2014 . Vertigo 10/31/2014 . Syncope 10/31/2014 . Pneumonia    hx of  . 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 PONV (postoperative nausea and vomiting)    and slow to wake up after foot surgery        Reviewed: Allergy & Precautions, NPO status , Patient's Chart, lab work & pertinent test results  History of Anesthesia Complications (+) PONV and history of anesthetic complications  Airway Mallampati: II  TM Distance: >3 FB Neck ROM: Full    Dental no notable dental hx.    Pulmonary shortness of breath, pneumonia,    Pulmonary exam normal breath sounds clear to auscultation       Cardiovascular hypertension, Pt. on medications +CHF  Normal cardiovascular exam Rhythm:Regular Rate:Normal  Cardiac evaluation April 2017: acute on chronic diastolic CHF  ECHO [id number]: Study Conclusions  - Left ventricle: The cavity size was normal. Wall thickness was  increased in a pattern of mild LVH. Systolic function was normal.  The estimated ejection fraction was in the range of 60% to 65%.  Doppler parameters are consistent with elevated ventricular  end-diastolic filling pressure. - Mitral valve: Calcified annulus. Mildly thickened leaflets .  There was mild to moderate regurgitation. - Left atrium: The atrium was moderately dilated. - Atrial septum: No defect or patent  foramen ovale was identified.   Neuro/Psych  Headaches, PSYCHIATRIC DISORDERS Depression    GI/Hepatic Neg liver ROS, GERD  Medicated,  Endo/Other  diabetes, Type 2, Insulin DependentHypothyroidism   Renal/GU Cr 1.29 K 5.0  Glu 151  negative genitourinary   Musculoskeletal  (+) Arthritis ,   Abdominal   Peds negative pediatric ROS (+)  Hematology  (+) anemia ,   Anesthesia Other Findings   Reproductive/Obstetrics negative OB ROS                         Anesthesia Physical Anesthesia Plan  ASA: III  Anesthesia Plan: General   Post-op Pain Management:    Induction: Intravenous  Airway Management Planned: Oral ETT  Additional Equipment:   Intra-op Plan:   Post-operative Plan: Extubation in OR  Informed Consent: I have reviewed the patients History and Physical, chart, labs and discussed the procedure including the risks, benefits and alternatives for the proposed anesthesia with the patient or authorized representative who has indicated his/her understanding and acceptance.   Dental advisory given  Plan Discussed with: CRNA  Anesthesia Plan Comments: (Difficult intubation 2016. Glidescope available.)        Anesthesia Quick Evaluation

## 2015-11-03 NOTE — Anesthesia Procedure Notes (Signed)
Procedure Name: Intubation Date/Time: 11/03/2015 11:22 AM Performed by: Deliah Boston Pre-anesthesia Checklist: Patient identified, Emergency Drugs available, Suction available and Patient being monitored Patient Re-evaluated:Patient Re-evaluated prior to inductionOxygen Delivery Method: Circle System Utilized Preoxygenation: Pre-oxygenation with 100% oxygen Intubation Type: IV induction Ventilation: Mask ventilation without difficulty Laryngoscope Size: Mac and 3 Grade View: Grade III Tube type: Oral Tube size: 7.5 mm Number of attempts: 1 Airway Equipment and Method: Stylet and Video-laryngoscopy Placement Confirmation: ETT inserted through vocal cords under direct vision,  positive ETCO2 and breath sounds checked- equal and bilateral Secured at: 22 cm Tube secured with: Tape Dental Injury: Teeth and Oropharynx as per pre-operative assessment  Difficulty Due To: Difficulty was anticipated and Difficult Airway- due to anterior larynx Comments: DVL x 1 with mac3, grade 3 view, DVL x 1 with glidecope mac 3, grade 1 view.

## 2015-11-03 NOTE — Brief Op Note (Signed)
11/03/2015  12:08 PM  PATIENT:  Jaclyn Herrera  70 y.o. female  PRE-OPERATIVE DIAGNOSIS:  left calcaneous retained hardware and heel ulcer  POST-OPERATIVE DIAGNOSIS:  * No post-op diagnosis entered *  PROCEDURE:  Procedure(s): IRRIGATION AND DEBRIDEMENT LEFT FOOT ULCER (Left) HARDWARE REMOVAL LEFT FOOT (Left)  SURGEON:  Surgeon(s) and Role:    * Gaynelle Arabian, MD - Primary  PHYSICIAN ASSISTANT:   ASSISTANTS: Arlee Muslim, PA-C   ANESTHESIA:   general  EBL:   minimal  BLOOD ADMINISTERED:none  DRAINS: Wound VAC   LOCAL MEDICATIONS USED:  NONE  SPECIMEN:  No Specimen  COUNTS:  YES  TOURNIQUET:    DICTATION: .Other Dictation: Dictation Number [id number]  PLAN OF CARE: Admit to inpatient   PATIENT DISPOSITION:  PACU - hemodynamically stable.

## 2015-11-03 NOTE — Progress Notes (Signed)
Placed call to [facility] for MD on call to inform that patient's blood glucose was now 569 one hour after administration of Humulin 500. Waiting for return call.

## 2015-11-04 LAB — BASIC METABOLIC PANEL
Anion gap: 7 (ref 5–15)
BUN: 38 mg/dL — AB (ref 6–20)
CALCIUM: 8.4 mg/dL — AB (ref 8.9–10.3)
CO2: 24 mmol/L (ref 22–32)
CREATININE: 1.16 mg/dL — AB (ref 0.44–1.00)
Chloride: 104 mmol/L (ref 101–111)
GFR calc Af Amer: 54 mL/min — ABNORMAL LOW (ref >60)
GFR, EST NON AFRICAN AMERICAN: 46 mL/min — AB (ref >60)
GLUCOSE: 457 mg/dL — AB (ref 65–99)
Potassium: 5 mmol/L (ref 3.5–5.1)
Sodium: 135 mmol/L (ref 135–145)

## 2015-11-04 LAB — GLUCOSE, CAPILLARY
GLUCOSE-CAPILLARY: 469 mg/dL — AB (ref 65–99)
GLUCOSE-CAPILLARY: 440 mg/dL — AB (ref 65–99)
Glucose-Capillary: 417 mg/dL — ABNORMAL HIGH (ref 65–99)

## 2015-11-04 MED ORDER — DOCUSATE SODIUM 100 MG PO CAPS
100.0000 mg | ORAL_CAPSULE | Freq: Two times a day (BID) | ORAL | Status: DC
  Administered 2015-11-04 – 2015-11-05 (×2): 100 mg via ORAL
  Filled 2015-11-04 (×2): qty 1

## 2015-11-04 MED ORDER — POLYETHYLENE GLYCOL 3350 17 G PO PACK
17.0000 g | PACK | Freq: Every day | ORAL | Status: DC | PRN
  Administered 2015-11-04: 17 g via ORAL

## 2015-11-04 MED ORDER — NON FORMULARY: 20.0000 mg | Freq: Every day | Status: DC

## 2015-11-04 MED ORDER — VANCOMYCIN HCL 10 G IV SOLR
1500.0000 mg | INTRAVENOUS | Status: DC
  Administered 2015-11-04: 1500 mg via INTRAVENOUS
  Filled 2015-11-04: qty 1500

## 2015-11-04 MED ORDER — ESOMEPRAZOLE MAGNESIUM 20 MG PO CPDR
20.0000 mg | DELAYED_RELEASE_CAPSULE | Freq: Every day | ORAL | Status: DC
  Administered 2015-11-04 – 2015-11-05 (×2): 20 mg via ORAL
  Filled 2015-11-04 (×2): qty 1

## 2015-11-04 MED ORDER — INSULIN REGULAR HUMAN (CONC) 500 UNIT/ML ~~LOC~~ SOPN
25.0000 [IU] | PEN_INJECTOR | Freq: Three times a day (TID) | SUBCUTANEOUS | Status: DC
  Administered 2015-11-04 (×2): 50 [IU] via SUBCUTANEOUS
  Administered 2015-11-05: 25 [IU] via SUBCUTANEOUS
  Administered 2015-11-05: 35 [IU] via SUBCUTANEOUS

## 2015-11-04 NOTE — Progress Notes (Signed)
Inpatient Diabetes Program Recommendations  AACE/ADA: New Consensus Statement on Inpatient Glycemic Control (2015)  Target Ranges:  Prepandial:   less than 140 mg/dL      Peak postprandial:   less than 180 mg/dL (1-2 hours)      Critically ill patients:  140 - 180 mg/dL   Results for Jaclyn Herrera, Jaclyn Herrera (MRN [id number]) as of 11/04/2015 13:35  Ref. Range 11/04/2015 07:47 11/04/2015 11:59  Glucose-Capillary Latest Ref Range: 65-99 mg/dL 469 (H) 440 (H)    Home DM Meds: U-500 insulin 25-50 units tidwc (patient draws up to 5-10 units on her insulin syringes at home)  Current Insulin Orders: U-500 insulin 25-50 units tidwc      -Spoke with patient about her home insulin regimen.  Sees Dr. Forde Dandy with [facility] for DM care.  Patient states she takes anywhere from 5-10 units of her U-500 insulin at home with meals.  Titrates her dose up or down based on her CBG and what she eats.  Uses insulin syringes at home to draw up her U-500 insulin from a vial.  If she draws up 5 units on the syringe, this would be equivalent to 25 units of U-100 insulin, 6 units would be 30 units, 7 units would be 35 units, 8 units would be 40 units, 9 units would be 45 units, and 10 units would be 50 units.  -Here in hospital we are giving patient U-500 insulin from the pen.  Doses reviewed with the RN and pharmacy today.  U-500 orders re-entered by pharmacy for clarification.     --Will follow patient during hospitalization--  Wyn Quaker RN, MSN, CDE Diabetes Coordinator Inpatient Glycemic Control Team Team Pager: [phone] (8a-5p)

## 2015-11-04 NOTE — Care Management Note (Signed)
Case Management Note  Patient Details  Name: BRITTANNI RULISON MRN: [id number] Date of Birth: [birth year]  Subjective/Objective:  Received wound vac form signed from Dr's office-i have faxed w/confirmation to Rickie-[employer] wound vac liason-await completed wound vac form & auth for delivery to hospital-she is aware of d/c tomorrow. Arville Go is the chosen [facility] agency rep Tim aware of orders, & d/c in am.PT eval-await recc. Patient has rw-she states 70years old.May need 3n1.                 Action/Plan:d/c plan home.   Expected Discharge Date:                 Expected Discharge Plan:  [facility]  In-House Referral:     Discharge planning Services  CM Consult  Post Acute Care Choice:    Choice offered to:  Patient  DME Arranged:    DME Agency:     HH Arranged:  RN [place] Agency:  [place]  Status of Service:  In process, will continue to follow  Medicare Important Message Given:    Date Medicare IM Given:    Medicare IM give by:    Date Additional Medicare IM Given:    Additional Medicare Important Message give by:     If discussed at [place] of Stay Meetings, dates discussed:    Additional Comments:  Dessa Phi, RN 11/04/2015, 3:02 PM

## 2015-11-04 NOTE — Evaluation (Signed)
Physical Therapy Evaluation Patient Details Name: Jaclyn Herrera MRN: [id number] DOB: 22-Feb-1946 Today's Date: 11/04/2015   History of Present Illness  Irrigation and debridement of left heel wound with hardware removal and VAC placement. S/P ORIF L calcaneous 7/16 with continued drainage from surgical site.  Clinical Impression  The patient  Has a wound VAC in place on the Left heel. The [facility]  Is now an added burden and safety risk for patient who is home alone except 4 hours /day . The patient also reports multiple falls at home. The patient now has to manage the  a RW and a wound VAC. The patient will benefit from PT to address the  Problems listed in the note below. The patient will benefit from post acute rehab  To ensure safety and wound healing of the L heel. Discussed with patient caregiver availability which is reportedly limited.    Follow Up Recommendations SNF;Supervision/Assistance - 24 hour    Equipment Recommendations  None recommended by PT    Recommendations for Other Services       Precautions / Restrictions Precautions Precautions: Fall Precaution Comments: multiple falls per patient Restrictions Other Position/Activity Restrictions: Drew's note of 5/25 states WBAT on Left leg. no orders writen in the chart.      Mobility  Bed Mobility Overal bed mobility: Needs Assistance Bed Mobility: Supine to Sit     Supine to sit: Min assist     General bed mobility comments: for VAC tubing  Transfers Overall transfer level: Needs assistance Equipment used: Rolling walker (2 wheeled) Transfers: Sit to/from [facility] Sit to Stand: Min assist Stand pivot transfers: Min assist       General transfer comment: cues for safety  Ambulation/Gait Ambulation/Gait assistance: Min assist Ambulation Distance (Feet): 60 Feet Assistive device: Rolling walker (2 wheeled) Gait Pattern/deviations: Step-to pattern Gait velocity: decr.   General Gait  Details: cues for preassure through forefoot. gait is slow, cues  for safety  Stairs            Wheelchair Mobility    Modified Rankin (Stroke Patients Only)       Balance                                             Pertinent Vitals/Pain Pain Assessment: 0-10 Pain Score: 3  Pain Location: L heel Pain Descriptors / Indicators: Aching Pain Intervention(s): Limited activity within patient's tolerance;Monitored during session;Repositioned    Home Living Family/patient expects to be discharged to:: Private residence Living Arrangements: Alone Available Help at Discharge: Personal care attendant (for 4 hours /day) Type of Home: House Home Access: Stairs to enter     Home Layout: One level Home Equipment: Environmental consultant - 2 wheels;Bedside commode Additional Comments: patient has aide that assists with groceries, family assisting with meals, limited outside resources.    Prior Function Level of Independence: Needs assistance   Gait / Transfers Assistance Needed: with RW  ADL's / Homemaking Assistance Needed: aide  4 hours /day        Hand Dominance        Extremity/Trunk Assessment   Upper Extremity Assessment: Overall WFL for tasks assessed           Lower Extremity Assessment: LLE deficits/detail   LLE Deficits / Details: VAC in place  Cervical / Trunk Assessment: Normal  Communication  Cognition Arousal/Alertness: Awake/alert Behavior During Therapy: WFL for tasks assessed/performed Overall Cognitive Status: Within Functional Limits for tasks assessed                      General Comments      Exercises        Assessment/Plan    PT Assessment Patient needs continued PT services  PT Diagnosis Difficulty walking;Acute pain   PT Problem List Decreased strength;Decreased activity tolerance;Decreased balance;Decreased mobility;Decreased safety awareness;Decreased knowledge of use of DME;Decreased cognition  PT  Treatment Interventions DME instruction;Gait training;Functional mobility training;Stair training;Therapeutic activities;Therapeutic exercise;Patient/family education   PT Goals (Current goals can be found in the Care Plan section) Acute Rehab PT Goals Patient Stated Goal: to walk again without a RW PT Goal Formulation: With patient Time For Goal Achievement: 11/11/15 Potential to Achieve Goals: Good    Frequency Min 6X/week   Barriers to discharge Other (comment) now has to manage a wound VAC while ambulating with  a RW., has falls history.    Co-evaluation               End of Session Equipment Utilized During Treatment: Gait belt Activity Tolerance: Patient tolerated treatment well Patient left: in chair;with call bell/phone within reach;with chair alarm set Nurse Communication: Mobility status         Time: 1515-1600 PT Time Calculation (min) (ACUTE ONLY): 45 min   Charges:   PT Evaluation $PT Eval Low Complexity: 1 Procedure PT Treatments $Gait Training: 8-22 mins $Self Care/Home Management: 8-22   PT G Codes:        Claretha Cooper 11/04/2015, 4:27 PM Tresa Endo PT [phone]

## 2015-11-04 NOTE — Care Management Note (Signed)
Case Management Note  Patient Details  Name: Jaclyn Herrera MRN: [id number] Date of Birth: [birth year]  Subjective/Objective:70 y/o f admitted w/DM, s/p I&D l foot-wound vac in place. Ordered for HHRN-home wound vac-[employer]-rep Rickie aware of home wound vac, & d/c in am. Awaiting home wound vac form to be signed by attending & faxed back to CM-faxed to fax#[phone],tc to [phone](Carolyn)-aware of form & has sent message to office to sign & fax back to fax#[phone](4e-floor where CM is) Patient wants Arville Go to provide [facility] for wound vac-already ordered-referral sent to [facility]-rep Tim,awaiting to confirm if able to accept.  PT eval placed to confirm dme needs.Patient from home alone.                     Action/Plan:d/c plan home   Expected Discharge Date:                 Expected Discharge Plan:  [place]  In-House Referral:     Discharge planning Services  CM Consult  Post Acute Care Choice:    Choice offered to:  Patient  DME Arranged:    DME Agency:     HH Arranged:    [place] Agency:     Status of Service:  In process, will continue to follow  Medicare Important Message Given:    Date Medicare IM Given:    Medicare IM give by:    Date Additional Medicare IM Given:    Additional Medicare Important Message give by:     If discussed at [place] of Stay Meetings, dates discussed:    Additional Comments:  Dessa Phi, RN 11/04/2015, 12:35 PM

## 2015-11-04 NOTE — Consult Note (Signed)
WOC wound consult note Reason for Consult:Post operative NPWT dressing in place.  Patient is anticipating discharge tomorrow with [facility] Arville Go) following.  First surgical dressing change to be performed by surgeon or PA, so I will order small NPWT dressing to the bedside tonight.  MD can remove dressing during rounds and Nursing staff can place a saline dressing until I can be paged to replace dressing. (See pager # below) Wound type: Surgical Pressure Ulcer POA: No Measurement:not seen today, so no measurements taken Wound bed:not seen today Drainage (amount, consistency, odor) serosanguinous in tubing. Periwound:Not seen today Dressing procedure/placement/frequency: NPWT dressing to be changed tomorrow.  MD will remove and WOC nurse will replace. [place] nursing team will remain available for replacement of dressing following surgeon's removal of same.   Thanks, Maudie Flakes, MSN, RN, [address], Arther Abbott  Pager# [phone]

## 2015-11-04 NOTE — Progress Notes (Signed)
Pharmacy Antibiotic Note  Jaclyn Herrera is a 70 y.o. female admitted on 11/03/2015 with wound infection.  Pharmacy has been consulted for vancomycin  Dosing. She has nonhealing L heel ulcer s/p I&D with removal of hardware from previous calcaneus fx and placement of wound vac  Day # 2 vancomycin  - Renal: scr is elevated but improved from pre-op labs (? Related to IVF)  Plan:  change vancomycin to 1500mg  IV q24h for goal trough 10-53mcg/ml  Monitor renal function  Check trough if remains on vancomycin > 4 days or change in renal function    Height: 5\' 8"  (172.7 cm) Weight: 190 lb (86.183 kg) IBW/kg (Calculated) : 63.9  Temp (24hrs), Avg:98 F (36.7 C), Min:97.7 F (36.5 C), Max:98.5 F (36.9 C)   Recent Labs Lab 11/01/15 1415 11/03/15 2108 11/04/15 0353  WBC 5.4 5.4  --   CREATININE 1.29* 1.30* 1.16*    Estimated Creatinine Clearance: 51.1 mL/min (by C-G formula based on Cr of 1.16).    Allergies  Allergen Reactions  . Sulfonamide Derivatives Anaphylaxis  . Bactrim [Sulfamethoxazole-Trimethoprim] Nausea Only  . Erythromycin Nausea And Vomiting  . Latex Itching and Other (See Comments)    If she wears gloves too long her hands start itching  . Mucinex [Guaifenesin Er] Nausea And Vomiting  . Aspirin Rash  . Levofloxacin Rash  . Tape Rash    Antimicrobials this admission: 5/24 >> vanco >>  Dose adjustments this admission:  Microbiology results: 5/24 tissue cx of L heel:  5/22 MRSA PCR: positive  Thank you for allowing pharmacy to be a part of this patient's care.  Doreene Eland, PharmD, BCPS.   Pager: [phone] 11/04/2015 7:46 AM

## 2015-11-04 NOTE — Progress Notes (Signed)
   Subjective: 1 Day Post-Op Procedure(s) (LRB): IRRIGATION AND DEBRIDEMENT LEFT FOOT ULCER (Left) HARDWARE REMOVAL LEFT FOOT appliction of wound vac (Left) Patient reports pain as mild.   Patient seen in rounds by Dr. Wynelle Link. Patient is well, but has had some minor complaints of pain in the foot and heel, requiring pain medications We will start therapy today.  Plan is to go Home after hospital stay. Will get DC planning to start working on arranging a home VAC for wound care.  Objective: Vital signs in last 24 hours: Temp:  [97.7 F (36.5 C)-98.5 F (36.9 C)] 97.8 F (36.6 C) (05/25 0554) Pulse Rate:  [71-103] 73 (05/25 0554) Resp:  [12-21] 20 (05/25 0554) BP: (137-200)/(51-85) 151/63 mmHg (05/25 0554) SpO2:  [100 %] 100 % (05/25 0554) Weight:  [86.183 kg (190 lb)] 86.183 kg (190 lb) (05/24 1002)  Intake/Output from previous day: 05/24 0701 - 05/25 0700 In: 1916.3 [P.O.:840; I.V.:1076.3] Out: 1460 [Urine:1450; Blood:10]   Recent Labs  11/01/15 1415 11/03/15 2108  HGB 11.0* 9.8*    Recent Labs  11/01/15 1415 11/03/15 2108  WBC 5.4 5.4  RBC 4.10 3.59*  HCT 33.4* 29.2*  PLT 173 148*    Recent Labs  11/01/15 1415 11/03/15 2108 11/04/15 0353  NA 135  --  135  K 5.0  --  5.0  CL 97*  --  104  CO2 27  --  24  BUN 43*  --  38*  CREATININE 1.29* 1.30* 1.16*  GLUCOSE 554*  --  457*  CALCIUM 9.1  --  8.4*   No results for input(s): LABPT, INR in the last 72 hours.  EXAM General - Patient is Alert and Appropriate Extremity - Neurovascular intact Dorsiflexion/Plantar flexion intact Dressing - dressing C/D/I Motor Function - intact, moving foot and toes well on exam.  VAC tubing in place.  Past Medical History  Diagnosis Date  . Diabetes mellitus   . Hypertension   . Arthritis   . Shingles   . hypothyroidism   . Bronchitis   . Hyperlipidemia   . GERD (gastroesophageal reflux disease)   . Headache   . CHF (congestive heart failure) ([place])   . DKA  (diabetic ketoacidoses) ([place]) 09/27/2014  . Chronic diastolic heart failure ([place]) 07/13/2015  . Hypothyroid 10/31/2014  . Vertigo 10/31/2014  . Syncope 10/31/2014  . Pneumonia     hx of   . 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 PONV (postoperative nausea and vomiting)     and slow to wake up after foot surgery     Assessment/Plan: 1 Day Post-Op Procedure(s) (LRB): IRRIGATION AND DEBRIDEMENT LEFT FOOT ULCER (Left) HARDWARE REMOVAL LEFT FOOT appliction of wound vac (Left) Active Problems:   Type 2 diabetes mellitus with left diabetic foot ulcer (HCC)  Estimated body mass index is 28.9 kg/(m^2) as calculated from the following:   Height as of this encounter: 5\' 8"  (1.727 m).   Weight as of this encounter: 86.183 kg (190 lb).  Continue Vancomycin Continue the Wound VAC Arrange for a Home Wound VAC  DVT Prophylaxis - Lovenox Weight-Bearing as tolerated to left leg D/C O2 and Pulse OX and try on Room Air Possibly home tomorrow.  Arlee Muslim, PA-C Orthopaedic Surgery 11/04/2015, 8:44 AM

## 2015-11-05 LAB — GLUCOSE, CAPILLARY
GLUCOSE-CAPILLARY: 114 mg/dL — AB (ref 65–99)
GLUCOSE-CAPILLARY: 72 mg/dL (ref 65–99)
GLUCOSE-CAPILLARY: 251 mg/dL — AB (ref 65–99)
GLUCOSE-CAPILLARY: 79 mg/dL (ref 65–99)
Glucose-Capillary: 71 mg/dL (ref 65–99)

## 2015-11-05 MED ORDER — OXYCODONE HCL 5 MG PO TABS: 5.0000 mg | ORAL_TABLET | ORAL | Status: DC | PRN

## 2015-11-05 NOTE — Care Management Note (Signed)
Case Management Note  Patient Details  Name: LAQUISHIA GIMLIN MRN: [id number] Date of Birth: 21-Jan-1946  Subjective/Objective:  Awaiting wound measurements to complete home hound vac process for auth of home wound vac to hospital.[facility] aware of HHRN-dsg changes, & d/c today.                 Action/Plan:d/c home w/HHC.   Expected Discharge Date:                Expected Discharge Plan:  [place]  In-House Referral:     Discharge planning Services  CM Consult  Post Acute Care Choice:    Choice offered to:  Patient  DME Arranged:    DME Agency:     HH Arranged:  RN [place] Agency:  [place]  Status of Service:  In process, will continue to follow  Medicare Important Message Given:    Date Medicare IM Given:    Medicare IM give by:    Date Additional Medicare IM Given:    Additional Medicare Important Message give by:     If discussed at [place] of Stay Meetings, dates discussed:    Additional Comments:  Dessa Phi, RN 11/05/2015, 8:22 AM

## 2015-11-05 NOTE — Progress Notes (Signed)
Physical Therapy Treatment Patient Details Name: Jaclyn Herrera MRN: [id number] DOB: 1946-03-22 Today's Date: 11/05/2015    History of Present Illness Irrigation and debridement of left heel wound with hardware removal and VAC placement. S/P ORIF L calcaneous 7/16 with continued drainage from surgical site.    PT Comments    Assisted OOB to bathroom then amb in hallway.  Practiced one step.  Pt moving well and plans to D/C to home.   Follow Up Recommendations        Equipment Recommendations       Recommendations for Other Services       Precautions / Restrictions Precautions Precautions: Fall Restrictions Weight Bearing Restrictions: No Other Position/Activity Restrictions: Drew's note of 5/25 states WBAT on Left leg. no orders writen in the chart.    Mobility  Bed Mobility Overal bed mobility: Modified Independent             General bed mobility comments: increased time  Transfers Overall transfer level: Needs assistance Equipment used: Rolling walker (2 wheeled) Transfers: Sit to/from Stand Sit to Stand: Supervision         General transfer comment: good safety cognition and use of hands  Ambulation/Gait Ambulation/Gait assistance: Supervision Ambulation Distance (Feet): 82 Feet Assistive device: Rolling walker (2 wheeled) Gait Pattern/deviations: Step-to pattern;Decreased stance time - left Gait velocity: decreased   General Gait Details: increased time with decrease WBing thru L LE by choice.    Stairs Stairs: Yes Stairs assistance: Min guard Stair Management: No rails;Alternating pattern;Forwards;With walker Number of Stairs: 1 General stair comments: increased time and no VC's needed for proper tech.    Wheelchair Mobility    Modified Rankin (Stroke Patients Only)       Balance                                    Cognition Arousal/Alertness: Awake/alert Behavior During Therapy: WFL for tasks  assessed/performed Overall Cognitive Status: Within Functional Limits for tasks assessed                      Exercises      General Comments        Pertinent Vitals/Pain Pain Assessment: No/denies pain    Home Living                      Prior Function            PT Goals (current goals can now be found in the care plan section) Progress towards PT goals: Progressing toward goals    Frequency  Min 6X/week    PT Plan Current plan remains appropriate    Co-evaluation             End of Session Equipment Utilized During Treatment: Gait belt Activity Tolerance: Patient tolerated treatment well Patient left: in chair;with call bell/phone within reach     Time: 1141-1212 PT Time Calculation (min) (ACUTE ONLY): 31 min  Charges:  $Gait Training: 8-22 mins $Therapeutic Activity: 8-22 mins                    G Codes:      Rica Koyanagi  PTA [facility]  Acute  Rehab Pager      [phone]

## 2015-11-05 NOTE — Care Management Note (Addendum)
Case Management Note  Patient Details  Name: Jaclyn Herrera MRN: [id number] Date of Birth: 05-30-1946               Action/Plan:  Contacted Dr Wynelle Link office and pt will not need HHPT post dc. Pt to follow up in surgeon's office and PT will be arranged at a later date. NCM spoke to pt and friend at bedside. She has her RW at home. Updated pt on delivery of wound vac. Arville Go will do a start of care on 11/06/2015.   Expected Discharge Date:  11/05/15               Expected Discharge Plan:  [place]  In-House Referral:  NA  Discharge planning Services  CM Consult  Post Acute Care Choice:  Home Health Choice offered to:  Patient  DME Arranged:  Vac DME Agency:  [facility]  HH Arranged:  RN HH Agency:  [place]  Status of Service:  Completed, signed off  Medicare Important Message Given:    Date Medicare IM Given:    Medicare IM give by:    Date Additional Medicare IM Given:    Additional Medicare Important Message give by:     If discussed at [place] of Stay Meetings, dates discussed:    Additional Comments:  Erenest Rasher, RN 11/05/2015, 4:42 PM

## 2015-11-05 NOTE — Progress Notes (Addendum)
Subjective: 2 Days Post-Op Procedure(s) (LRB): IRRIGATION AND DEBRIDEMENT LEFT FOOT ULCER (Left) HARDWARE REMOVAL LEFT FOOT appliction of wound vac (Left) Patient reports pain as mild.   Patient seen in rounds with Dr. Wynelle Link. Patient is well, and has had no acute complaints or problems Patient is ready to go home today. Placed a saline dressing over wound today on rounds and will have the Wound Care Team reapply the [facility] prior to discharge. Home arrangements ordered for [facility] at home.  Will VAC dressing changes on Monday, Wednesday, Friday. Will have patient seen in the office this Friday for a VAC dressing change to be done by Dr. Wynelle Link.  Asked the patient to bring the [facility] dressing packet to the office on Friday for a dressing change on wound check by the surgeon.  Objective: Vital signs in last 24 hours: Temp:  [97.8 F (36.6 C)-98.3 F (36.8 C)] 97.9 F (36.6 C) (05/26 0554) Pulse Rate:  [68-74] 68 (05/26 0554) Resp:  [15-18] 15 (05/26 0554) BP: (148-167)/(52-55) 158/53 mmHg (05/26 0554) SpO2:  [97 %-100 %] 100 % (05/26 0554)  Intake/Output from previous day:  Intake/Output Summary (Last 24 hours) at 11/05/15 0751 Last data filed at 11/05/15 0556  Gross per 24 hour  Intake 2176.5 ml  Output   2300 ml  Net -123.5 ml    Labs:  Recent Labs  11/03/15 2108  HGB 9.8*    Recent Labs  11/03/15 2108  WBC 5.4  RBC 3.59*  HCT 29.2*  PLT 148*    Recent Labs  11/03/15 2108 11/04/15 0353  NA  --  135  K  --  5.0  CL  --  104  CO2  --  24  BUN  --  38*  CREATININE 1.30* 1.16*  GLUCOSE  --  457*  CALCIUM  --  8.4*   No results for input(s): LABPT, INR in the last 72 hours.  EXAM: General - Patient is Alert, Appropriate and Oriented Extremity - Neurovascular intact Sensation intact distally Incision - clean, dry, no drainage, wound edges look good, tissues appear healthy. Wound on left heel region Measurements - 1 cm x 1 cm opening and 1/2 cm in  depth. Motor Function - intact, moving foot and toes well on exam.   Assessment/Plan: 2 Days Post-Op Procedure(s) (LRB): IRRIGATION AND DEBRIDEMENT LEFT FOOT ULCER (Left) HARDWARE REMOVAL LEFT FOOT appliction of wound vac (Left) Procedure(s) (LRB): IRRIGATION AND DEBRIDEMENT LEFT FOOT ULCER (Left) HARDWARE REMOVAL LEFT FOOT appliction of wound vac (Left) Past Medical History  Diagnosis Date  . Diabetes mellitus   . Hypertension   . Arthritis   . Shingles   . hypothyroidism   . Bronchitis   . Hyperlipidemia   . GERD (gastroesophageal reflux disease)   . Headache   . CHF (congestive heart failure) ([place])   . DKA (diabetic ketoacidoses) ([place]) 09/27/2014  . Chronic diastolic heart failure ([place]) 07/13/2015  . Hypothyroid 10/31/2014  . Vertigo 10/31/2014  . Syncope 10/31/2014  . Pneumonia     hx of   . 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 PONV (postoperative nausea and vomiting)     and slow to wake up after foot surgery    Active Problems:   Type 2 diabetes mellitus with left diabetic foot ulcer ([place])  Estimated body mass index is 28.9 kg/(m^2) as calculated from the following:   Height as of this encounter: 5\' 8"  (1.727 m).  Weight as of this encounter: 86.183 kg (190 lb). Discharge home with home health  Home arrangements ordered for [facility] at home.  Will VAC dressing changes on Monday, Wednesday, Friday. Will have patient seen in the office this Friday for a VAC dressing change to be done by Dr. Wynelle Link.  Asked the patient to bring the [facility] dressing packet to the office on Friday for a dressing change on wound check by the surgeon. Diet - Diabetic diet Follow up - next Friday 11/12/2015 Activity - WBAT Disposition - Home Condition Upon Discharge - Stable D/C Meds - See DC Summary Aspirin 325 mg daily for three weeks.  Arlee Muslim, PA-C Orthopaedic Surgery 11/05/2015, 7:51 AM

## 2015-11-05 NOTE — Discharge Summary (Signed)
Physician Discharge Summary   Patient ID: Jaclyn Herrera MRN: [id number] DOB/AGE: 1945/08/26 70 y.o.  Admit date: 11/03/2015 Discharge date: 11/05/2015  Primary Diagnosis:   left calcaneous retained hardware and heel ulcer  Admission Diagnoses:  Past Medical History  Diagnosis Date  . Diabetes mellitus   . Hypertension   . Arthritis   . Shingles   . hypothyroidism   . Bronchitis   . Hyperlipidemia   . GERD (gastroesophageal reflux disease)   . Headache   . CHF (congestive heart failure) ([place])   . DKA (diabetic ketoacidoses) ([place]) 09/27/2014  . Chronic diastolic heart failure ([place]) 07/13/2015  . Hypothyroid 10/31/2014  . Vertigo 10/31/2014  . Syncope 10/31/2014  . Pneumonia     hx of   . 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 PONV (postoperative nausea and vomiting)     and slow to wake up after foot surgery    Discharge Diagnoses:   Active Problems:   Type 2 diabetes mellitus with left diabetic foot ulcer ([place])  Procedure:  Procedure(s) (LRB): IRRIGATION AND DEBRIDEMENT LEFT FOOT ULCER (Left) HARDWARE REMOVAL LEFT FOOT appliction of wound vac (Left)   Consults: None  HPI: Jaclyn Herrera is a 70 year old female, diabetic who had a calcaneus fracture several months ago. She healed the bone uneventfully, but then developed a left heel ulcer. It has not healed with the dressing changes and has had persistent scant drainage. She presents now for hardware removal and debridement of the ulcer with possible VAC placement.   Laboratory Data: Hospital Outpatient Visit on 11/01/2015  Component Date Value Ref Range Status  . WBC 11/01/2015 5.4  4.0 - 10.5 K/uL Final  . RBC 11/01/2015 4.10  3.87 - 5.11 MIL/uL Final  . Hemoglobin 11/01/2015 11.0* 12.0 - 15.0 g/dL Final  . HCT 11/01/2015 33.4* 36.0 - 46.0 % Final  . MCV 11/01/2015 81.5  78.0 - 100.0 fL Final  . MCH 11/01/2015 26.8  26.0 - 34.0 pg Final  . MCHC 11/01/2015 32.9  30.0 - 36.0 g/dL Final  . RDW  11/01/2015 15.4  11.5 - 15.5 % Final  . Platelets 11/01/2015 173  150 - 400 K/uL Final  . Sodium 11/01/2015 135  135 - 145 mmol/L Final  . Potassium 11/01/2015 5.0  3.5 - 5.1 mmol/L Final  . Chloride 11/01/2015 97* 101 - 111 mmol/L Final  . CO2 11/01/2015 27  22 - 32 mmol/L Final  . Glucose, Bld 11/01/2015 554* 65 - 99 mg/dL Final   Comment: CRITICAL RESULT CALLED TO, READ BACK BY AND VERIFIED WITH: PHILLIPS,C. RN _0  ON 5.22.17 BY MCCOY,N.   . BUN 11/01/2015 43* 6 - 20 mg/dL Final  . Creatinine, Ser 11/01/2015 1.29* 0.44 - 1.00 mg/dL Final  . Calcium 11/01/2015 9.1  8.9 - 10.3 mg/dL Final  . GFR calc non Af Amer 11/01/2015 41* >60 mL/min Final  . GFR calc Af Amer 11/01/2015 47* >60 mL/min Final   Comment: (NOTE) The eGFR has been calculated using the CKD EPI equation. This calculation has not been validated in all clinical situations. eGFR's persistently <60 mL/min signify possible Chronic Kidney Disease.   . Anion gap 11/01/2015 11  5 - 15 Final  . Hgb A1c MFr Bld 11/01/2015 9.3* 4.8 - 5.6 % Final   Comment: (NOTE)         Pre-diabetes: 5.7 - 6.4         Diabetes: >6.4  Glycemic control for adults with diabetes: <7.0   . Mean Plasma Glucose 11/01/2015 220   Final   Comment: (NOTE) Performed At: [facility] [place], [place] [id number] Lindon Romp MD [id number]   . MRSA, PCR 11/01/2015 POSITIVE* NEGATIVE Final   AFTERHOURS  . Staphylococcus aureus 11/01/2015 POSITIVE* NEGATIVE Final   Comment:        The Xpert SA Assay (FDA approved for NASAL specimens in patients over 39 years of age), is one component of a comprehensive surveillance program.  Test performance has been validated by [facility] for patients greater than or equal to 77 year old. It is not intended to diagnose infection nor to guide or monitor treatment.     Recent Labs  11/03/15 2108  HGB 9.8*    Recent Labs  11/03/15 2108  WBC 5.4  RBC 3.59*  HCT  29.2*  PLT 148*    Recent Labs  11/03/15 2108 11/04/15 0353  NA  --  135  K  --  5.0  CL  --  104  CO2  --  24  BUN  --  38*  CREATININE 1.30* 1.16*  GLUCOSE  --  457*  CALCIUM  --  8.4*   No results for input(s): LABPT, INR in the last 72 hours.  X-Rays:No results found.  EKG: Orders placed or performed during the hospital encounter of 08/13/15  . EKG 12-Lead  . EKG 12-Lead  . EKG 12-Lead  . EKG 12-Lead     Hospital Course: Patient was admitted to [facility] and taken to the OR and underwent the above state procedure without complications.  Patient tolerated the procedure well and was later transferred to the recovery room and then to the orthopaedic floor for postoperative care.  They were given PO and IV analgesics for pain control following their surgery.  They were stared on Vancomycin antibiotics. The patient was allowed to be WBAT with therapy. Discharge planning was consulted to help with postop disposition and equipment needs.  Home VAC dressing arrangements were also ordered.  Patient had a good night on the evening of surgery and started to get up OOB with therapy on day one. Patient was seen in rounds on day two.  The VAC was changed by Dr. Wynelle Link and the patient was ready to go home.  They were given discharge instructions and dressing directions.   Discharge home with home health  Home arrangements ordered for [facility] at home. Will VAC dressing changes on Monday, Wednesday, Friday. Will have patient seen in the office next Friday for a VAC dressing change to be done by Dr. Wynelle Link. Asked the patient to bring the [facility] dressing packet to the office on Friday 11/12/2015 for a dressing change on wound check by the surgeon. Diet - Diabetic diet Follow up - next Friday 11/12/2015 Activity - WBAT Disposition - Home Condition Upon Discharge - Stable D/C Meds - See DC Summary Aspirin 325 mg daily for three weeks.  Discharge Instructions    Call MD / Call 911     Complete by:  As directed   If you experience chest pain or shortness of breath, CALL 911 and be transported to the hospital emergency room.  If you develope a fever above 101 F, pus (white drainage) or increased drainage or redness at the wound, or calf pain, call your surgeon's office.     Constipation Prevention    Complete by:  As directed   Drink plenty  of fluids.  Prune juice may be helpful.  You may use a stool softener, such as Colace (over the counter) 100 mg twice a day.  Use MiraLax (over the counter) for constipation as needed.     Diet - low sodium heart healthy    Complete by:  As directed      Discharge instructions    Complete by:  As directed   Pick up stool softner and laxative for home use following surgery while on pain medications. Do not submerge incision under water. Please use good hand washing techniques while changing dressing each day. May shower starting three days after surgery. Please use a clean towel to pat the incision dry following showers. Continue to use ice for pain and swelling after surgery. Do not use any lotions or creams on the incision until instructed by your surgeon.  Aspirin 325 mg daily for three weeks, then can discontinue the Aspirin.  Postoperative Constipation Protocol  Constipation - defined medically as fewer than three stools per week and severe constipation as less than one stool per week.  One of the most common issues patients have following surgery is constipation.  Even if you have a regular bowel pattern at home, your normal regimen is likely to be disrupted due to multiple reasons following surgery.  Combination of anesthesia, postoperative narcotics, change in appetite and fluid intake all can affect your bowels.  In order to avoid complications following surgery, here are some recommendations in order to help you during your recovery period.  Colace (docusate) - Pick up an over-the-counter form of Colace or another stool softener  and take twice a day as long as you are requiring postoperative pain medications.  Take with a full glass of water daily.  If you experience loose stools or diarrhea, hold the colace until you stool forms back up.  If your symptoms do not get better within 1 week or if they get worse, check with your doctor.  Dulcolax (bisacodyl) - Pick up over-the-counter and take as directed by the product packaging as needed to assist with the movement of your bowels.  Take with a full glass of water.  Use this product as needed if not relieved by Colace only.   MiraLax (polyethylene glycol) - Pick up over-the-counter to have on hand.  MiraLax is a solution that will increase the amount of water in your bowels to assist with bowel movements.  Take as directed and can mix with a glass of water, juice, soda, coffee, or tea.  Take if you go more than two days without a movement. Do not use MiraLax more than once per day. Call your doctor if you are still constipated or irregular after using this medication for 7 days in a row.  If you continue to have problems with postoperative constipation, please contact the office for further assistance and recommendations.  If you experience "the worst abdominal pain ever" or develop nausea or vomiting, please contact the office immediatly for further recommendations for treatment.  Home arrangements ordered for [facility] at home.  Will need VAC dressing changes on Monday, Wednesday, Friday. Will have patient seen in the office next Friday 11/12/2015 for a VAC dressing change to be done by Dr. Wynelle Link.  Asked the patient to bring the [facility] dressing packet to the office on Friday for a dressing change on wound check by the surgeon.     Driving restrictions    Complete by:  As directed   No driving until  released by the physician.     Increase activity slowly as tolerated    Complete by:  As directed      Lifting restrictions    Complete by:  As directed   No lifting until released by the  physician.     Patient may shower    Complete by:  As directed   You may shower without a dressing once there is no drainage.  Do not wash over the wound.  If drainage remains, do not shower until drainage stops.     Weight bearing as tolerated    Complete by:  As directed   Laterality:  left  Extremity:  Lower            Medication List    TAKE these medications        acetaminophen 500 MG tablet  Commonly known as:  TYLENOL  Take 500-1,000 mg by mouth every 6 (six) hours as needed (For pain.).     albuterol 108 (90 Base) MCG/ACT inhaler  Commonly known as:  PROVENTIL HFA;VENTOLIN HFA  Inhale 2 puffs into the lungs every 4 (four) hours as needed for wheezing or shortness of breath.     ARNICARE EX  Apply 1 application topically daily as needed (For bruising.).     BLINK TEARS OP  Place 1 drop into both eyes 4 (four) times daily as needed (Dry eyes).     carisoprodol 350 MG tablet  Commonly known as:  SOMA  Take 175-350 mg by mouth 3 (three) times daily as needed (For muscle spasms or cramping.).     esomeprazole 20 MG capsule  Commonly known as:  NEXIUM  Take 20 mg by mouth daily.     fexofenadine 180 MG tablet  Commonly known as:  ALLEGRA  Take 180 mg by mouth daily as needed for allergies or rhinitis.     fluticasone 50 MCG/ACT nasal spray  Commonly known as:  FLONASE  Place 2 sprays into the nose 2 (two) times daily as needed for allergies.     furosemide 20 MG tablet  Commonly known as:  LASIX  Take 1 tablet (20 mg total) by mouth daily.     insulin regular human CONCENTRATED 500 UNIT/ML injection  Commonly known as:  HUMULIN R  Inject 0.02 mLs (10 Units total) into the skin daily. Sliding scale range is reported 60-150 units based on a sliding scale. She takes it 30 minutes before meals.     levothyroxine 125 MCG tablet  Commonly known as:  SYNTHROID, LEVOTHROID  Take 125-187.5 mcg by mouth daily before breakfast. Take one tablet daily except for on  Sundays. Take one and a half tablets on that day.     ondansetron 4 MG tablet  Commonly known as:  ZOFRAN  Take 1 tablet (4 mg total) by mouth every 6 (six) hours as needed for nausea.     oxyCODONE 5 MG immediate release tablet  Commonly known as:  Oxy IR/ROXICODONE  Take 1-2 tablets (5-10 mg total) by mouth every 4 (four) hours as needed for moderate pain or severe pain.     PARoxetine 40 MG tablet  Commonly known as:  PAXIL  Take 40 mg by mouth daily.     PERDIEM PO  Take 1 tablet by mouth daily as needed (For constipation.).     potassium chloride 10 MEQ tablet  Commonly known as:  K-DUR  Take 1 tablet (10 mEq total) by mouth daily.  quinapril 40 MG tablet  Commonly known as:  ACCUPRIL  Take 40 mg by mouth daily.     simvastatin 20 MG tablet  Commonly known as:  ZOCOR  Take 20 mg by mouth at bedtime.     torsemide 20 MG tablet  Commonly known as:  DEMADEX  Take 1 tablet (20 mg total) by mouth 2 (two) times daily.     VITAMIN C PO  Take 1 tablet by mouth daily.           Follow-up Information    Follow up with Gearlean Alf, MD. Schedule an appointment as soon as possible for a visit on 11/12/2015.   Specialty:  Orthopedic Surgery   Why:  Call office at [phone] to setup appointment with Dr. Wynelle Link for wound re-check and VAC change.   Contact information:   [address] [place] [address] [phone]       Signed: Arlee Muslim, PA-C Orthopaedic Surgery 11/05/2015, 8:05 AM

## 2015-11-05 NOTE — Consult Note (Signed)
WOC wound consult note Reason for Consult: Replace NPWT dressing to anterior heel of left foot Wound type:surgical Pressure Ulcer POA: Yes/No Measurement: 3cm x 2cm x 1.5cm with 1.5cm closely approximated (with sutures) incision at 6 o'clock Wound bed: red, moist Drainage (amount, consistency, odor) serosanguinous, small amount Periwound:intact, dry Dressing procedure/placement/frequency: NPWT dressing removed (first surgical dressing change) by Dr. Maureen Ralphs this am.  Wound cleansed with NS, and gently patted dry.  Defect filled with 1 piece of black granufoam, secured with drape. periwound skin protected with drape, then NPWT is bridged to lateral left foot with a second piece of black granufoam. All foam is secured with drape and the T.R.A.C. pad is placed at lateral foot. Continuous negative pressure is initiated at 134mmHg and a seal is achieved. Tubing is secured with a 4-inch ACE wrap to avoid falls.  Patient and bedside RN ask this writer to assess a "bite" on her inner buttocks that she has been treating with H2O2. Area on medial left buttock at gluteal cleft ans is a pustule measuring .8cm round. Patient reports scratching at area in her sleep.  For this reason, we have covered it with a soft silicone foam dressing and instructed her to use warm compresses and continue with the 1/2 strength H2O2 until resolved.  She is asked to show her MD if it does not resovle in 7 days.  [place] nursing team will not follow, but will remain available to this patient, the nursing and medical teams.  Please re-consult if needed. Thanks, Maudie Flakes, MSN, RN, [place], Arther Abbott  Pager# [phone]

## 2015-11-05 NOTE — Care Management Note (Signed)
Case Management Note  Patient Details  Name: [name] MRN: [id number] Date of Birth: 1945/07/05  Subjective/Objective: Received call from [place] from [facility]-she provided purchase [phone].I have faxed w/confirmation wound vac emasurements to [phone]. They will release the wound vac for delivery. I have contacted [facility] [phone] Marcie Bal for delivery of wound vac to hospital-[facility] will call me back with Estimated Time of arrival likely between 5-6p.                 Action/Plan:d/c home w/HHC/wound vac.   Expected Discharge Date:                 Expected Discharge Plan:  [place]  In-House Referral:     Discharge planning Services  CM Consult  Post Acute Care Choice:    Choice offered to:  Patient  DME Arranged:  Vac DME Agency:     HH Arranged:  RN, PT HH Agency:  [place]  Status of Service:  Completed, signed off  Medicare Important Message Given:    Date Medicare IM Given:    Medicare IM give by:    Date Additional Medicare IM Given:    Additional Medicare Important Message give by:     If discussed at [place] of Stay Meetings, dates discussed:    Additional Comments:  Dessa Phi, RN 11/05/2015, 2:57 PM

## 2015-11-05 NOTE — Care Management Note (Signed)
Case Management Note  Patient Details  Name: Jaclyn Herrera MRN: [id number] Date of Birth: August 14, 1945  Subjective/Objective: Just received call from [facility]-Celena-they will be releasing the order(she will call me back w/purchasing #), therefore delivery to the hospital of the wound vac by [facility] will be around 5-6p. Celena will contact Ricki-[facility] wound vac hospital liason to confirm all paperwork-i have contacted Ricki & told her to expect a call from [facility].  I have also updated current CM(Alesia, & nurse-Dee. Arville Go rep Tim aware of current timing of delivery of wound vac.                  Action/Plan:d/c home w/HHC/wound vac.   Expected Discharge Date:                Expected Discharge Plan:  [place]  In-House Referral:     Discharge planning Services  CM Consult  Post Acute Care Choice:    Choice offered to:  Patient  DME Arranged:    DME Agency:     HH Arranged:  RN [place] Agency:  [place]  Status of Service:  In process, will continue to follow  Medicare Important Message Given:    Date Medicare IM Given:    Medicare IM give by:    Date Additional Medicare IM Given:    Additional Medicare Important Message give by:     If discussed at [place] of Stay Meetings, dates discussed:    Additional Comments:  Dessa Phi, RN 11/05/2015, 2:40 PM

## 2015-11-05 NOTE — Discharge Instructions (Signed)
Diabetes and Foot Care Diabetes may cause you to have problems because of poor blood supply (circulation) to your feet and legs. This may cause the skin on your feet to become thinner, break easier, and heal more slowly. Your skin may become dry, and the skin may peel and crack. You may also have nerve damage in your legs and feet causing decreased feeling in them. You may not notice minor injuries to your feet that could lead to infections or more serious problems. Taking care of your feet is one of the most important things you can do for yourself.  HOME CARE INSTRUCTIONS  Wear shoes at all times, even in the house. Do not go barefoot. Bare feet are easily injured.  Check your feet daily for blisters, cuts, and redness. If you cannot see the bottom of your feet, use a mirror or ask someone for help.  Wash your feet with warm water (do not use hot water) and mild soap. Then pat your feet and the areas between your toes until they are completely dry. Do not soak your feet as this can dry your skin.  Apply a moisturizing lotion or petroleum jelly (that does not contain alcohol and is unscented) to the skin on your feet and to dry, brittle toenails. Do not apply lotion between your toes.  Trim your toenails straight across. Do not dig under them or around the cuticle. File the edges of your nails with an emery board or nail file.  Do not cut corns or calluses or try to remove them with medicine.  Wear clean socks or stockings every day. Make sure they are not too tight. Do not wear knee-high stockings since they may decrease blood flow to your legs.  Wear shoes that fit properly and have enough cushioning. To break in new shoes, wear them for just a few hours a day. This prevents you from injuring your feet. Always look in your shoes before you put them on to be sure there are no objects inside.  Do not cross your legs. This may decrease the blood flow to your feet.  If you find a minor scrape,  cut, or break in the skin on your feet, keep it and the skin around it clean and dry. These areas may be cleansed with mild soap and water. Do not cleanse the area with peroxide, alcohol, or iodine.  When you remove an adhesive bandage, be sure not to damage the skin around it.  If you have a wound, look at it several times a day to make sure it is healing.  Do not use heating pads or hot water bottles. They may burn your skin. If you have lost feeling in your feet or legs, you may not know it is happening until it is too late.  Make sure your health care provider performs a complete foot exam at least annually or more often if you have foot problems. Report any cuts, sores, or bruises to your health care provider immediately. SEEK MEDICAL CARE IF:   You have an injury that is not healing.  You have cuts or breaks in the skin.  You have an ingrown nail.  You notice redness on your legs or feet.  You feel burning or tingling in your legs or feet.  You have pain or cramps in your legs and feet.  Your legs or feet are numb.  Your feet always feel cold. SEEK IMMEDIATE MEDICAL CARE IF:   There is increasing redness,  swelling, or pain in or around a wound.  There is a red line that goes up your leg.  Pus is coming from a wound.  You develop a fever or as directed by your health care provider.  You notice a bad smell coming from an ulcer or wound.   This information is not intended to replace advice given to you by your health care provider. Make sure you discuss any questions you have with your health care provider.   Document Released: 05/26/2000 Document Revised: 01/29/2013 Document Reviewed: 11/05/2012 Elsevier Interactive Patient Education 2016 [employer]  Skin Ulcer A skin ulcer is an open sore that can be shallow or deep. Skin ulcers sometimes become infected and are difficult to treat. It may be 1 month or longer before real healing progress is made. CAUSES    Injury.  Problems with the veins or arteries.  Diabetes.  Insect bites.  Bedsores.  Inflammatory conditions. SYMPTOMS   Pain, redness, swelling, and tenderness around the ulcer.  Fever.  Bleeding from the ulcer.  Yellow or clear fluid coming from the ulcer. DIAGNOSIS  There are many types of skin ulcers. Any open sores will be examined. Certain tests will be done to determine the kind of ulcer you have. The right treatment depends on the type of ulcer you have. TREATMENT  Treatment is a long-term challenge. It may include:  Wearing an elastic wrap, compression stockings, or gel cast over the ulcer area.  Taking antibiotic medicines or putting antibiotic creams on the affected area if there is an infection. HOME CARE INSTRUCTIONS  Put on your bandages (dressings), wraps, or casts over the ulcer as directed by your caregiver.  Change all dressings as directed by your caregiver.  Take all medicines as directed by your caregiver.  Keep the affected area clean and dry.  Avoid injuries to the affected area.  Eat a well-balanced, healthy diet that includes plenty of fruit and vegetables.  If you smoke, consider quitting or decreasing the amount of cigarettes you smoke.  Once the ulcer heals, get regular exercise as directed by your caregiver.  Work with your caregiver to make sure your blood pressure, cholesterol, and diabetes are well-controlled.  Keep your skin moisturized. Dry skin can crack and lead to skin ulcers. SEEK IMMEDIATE MEDICAL CARE IF:   Your pain gets worse.  You have swelling, redness, or fluids around the ulcer.  You have chills.  You have a fever. MAKE SURE YOU:   Understand these instructions.  Will watch your condition.  Will get help right away if you are not doing well or get worse.   This information is not intended to replace advice given to you by your health care provider. Make sure you discuss any questions you have with your  health care provider.   Document Released: 07/06/2004 Document Revised: 08/21/2011 Document Reviewed: 01/13/2011 Elsevier Interactive Patient Education 2016 [place] Closure Therapy Home Guide Vacuum-assisted closure therapy [facility] therapy) is a device that helps wounds heal. It is used on wounds that cannot be closed with stitches. They often heal slowly. VAC therapy helps the wound stay clean and healthy while its edges slowly grow back together. VAC therapy uses a bandage (dressing) that is made of foam. It is put inside the wound. Then, a drape is placed over the wound. This drape sticks to your skin (adhesive) to keep air out. A tube is hooked up to a small pump and is attached to the drape.  The pump sucks fluid and germs from the wound. It can also decrease any bad smell that comes from the wound. RISKS AND COMPLICATIONS VAC therapy is usually safe to use at home. Your skin may get sore from the adhesive drape. That is the most common problem. However, more serious problems can develop, such as:   Bleeding. This can happen if the dressing in the wound comes into contact with blood vessels. A little bleeding may occur when the dressing is being changed. This is normal now and then. Major bleeding can happen if a large blood vessel breaks. This is more likely if you are taking blood-thinning medicine. Emergency surgery may be needed.  Infection. This can happen if the dressing has an air leak that is not repaired within a couple of hours.  Dehydration. This can happen if the pump sucks out too much body fluid. DRESSING CHANGES Your dressing will have to be changed. Sometimes this is needed once a day. Other times, a dressing change must be done 3 times a week. How often you change your dressing will depend on what your wound is like. A trained caregiver will most likely change the dressing. However, a family member or friend may be trained to change the dressing. Below are  steps to change a dressing in order to prevent an infection. The steps apply to you or the person that changes your dressing.  Wash your hands with soap and water before and after each dressing change.  Wear gloves and protective clothing. This may include eye protection.  Do not allow anyone to change your dressing if they have an infection or a skin condition. Even a small cut can be a problem. To change the dressing:   Turn off the pump.  Take off the adhesive drape.  Disconnect the tube from the dressing.  Take out the dressing that is inside the wound. If the dressing sticks, use a germ-free (sterile), saltwater solution to wet the dressing. This helps it come out more easily. If it hurts when the dressing is changed, take pain medicine 30 minutes before the dressing change.  Cleanse the wound with normal saline or sterile water.  Apply a skin barrier film to the skin that will be covered with the drape. This will protect the skin.  Put a new dressing into the wound.  Apply a new drape and tube.  Replace the container in the pump that collects fluid if it is full. Do this at least once per week.  Turn the pump back on.  Your doctor will decide what setting of suction is best. Do not change the settings on the machine without talking to your nurse or doctor. HOME CARE INSTRUCTIONS   The VAC pump has an alarm. It goes off if there are any problems such as a leak.  Ask your caregiver what to do if the alarm goes off.  Call your caregiver right away if the alarm goes off and you cannot fix the problem.  Do not turn off the pump for more than 2 hours.  Check your wound carefully at each dressing change for signs of infection. Watch for redness, swelling, or any fluid leaking from the wound. If you develop an infection:  You may have to stop VAC therapy.  The wound will need to be cleaned and washed out.  You will have to take antibiotic medicine.  Ask your caregiver  what activities you should or should not do while you are getting VAC  therapy. This will depend on your particular wound.  Ask if it is okay to turn off the pump so you can take a shower. If it is okay, make sure the wound is covered with plastic. The wound area must stay dry.  Drink enough fluids to keep your urine clear or pale yellow.  Eat foods that contain a lot of protein. Examples are meat, poultry, seafood, eggs, nuts, beans, and peas. Protein can help your wound heal. SEEK MEDICAL CARE IF:  Your wound itches or hurts.  Dressing changes are often painful or bleeding often occurs.  You have a headache.  You have diarrhea.  You have a sore throat.  You have a rash.  You feel nauseous.  You feel dizzy or weak. SEEK IMMEDIATE MEDICAL CARE IF:   You have very bad pain.  You have bleeding that will not stop.  Your wound smells bad.  You have redness, swelling, or fluid leaking from your wound.  Your alarm goes off and you do not know what to do.  You have a fever.   This information is not intended to replace advice given to you by your health care provider. Make sure you discuss any questions you have with your health care provider.   Document Released: 08/21/2011 Document Reviewed: 08/21/2011 Elsevier Interactive Patient Education 2016 [place] arrangements ordered for [facility] at home.  Will VAC dressing changes on Monday, Wednesday, Friday. Will have patient seen in the office next Friday 11/12/2015 for a VAC dressing change to be done by Dr. Wynelle Link.  Asked the patient to bring the [facility] dressing packet to the office on Friday 11/12/2015 for a dressing change on wound check by the surgeon.

## 2015-11-05 NOTE — Care Management Note (Signed)
Case Management Note  Patient Details  Name: Jaclyn Herrera MRN: [id number] Date of Birth: [birth year]  Subjective/Objective:  [employer] rep Rickie-has faxed wound measurements to [facility] for auth for home wound vac. CM will call [facility] @ 1p [phone], rel#[id number] for update on status of auth & delivery. Just noted PT recc SNF. CM will discuss w/patient if going to SNF is what she wants. [facility] is still following, rep Tim aware.                 Action/Plan:Current d/c plan home w/HHC   Expected Discharge Date:                 Expected Discharge Plan:  [place]  In-House Referral:     Discharge planning Services  CM Consult  Post Acute Care Choice:    Choice offered to:  Patient  DME Arranged:    DME Agency:     HH Arranged:  RN [place] Agency:  [place]  Status of Service:  In process, will continue to follow  Medicare Important Message Given:    Date Medicare IM Given:    Medicare IM give by:    Date Additional Medicare IM Given:    Additional Medicare Important Message give by:     If discussed at [place] of Stay Meetings, dates discussed:    Additional Comments:  Dessa Phi, RN 11/05/2015, 12:07 PM

## 2015-11-06 DIAGNOSIS — I5032 Chronic diastolic (congestive) heart failure: Secondary | ICD-10-CM | POA: Diagnosis not present

## 2015-11-06 DIAGNOSIS — E11621 Type 2 diabetes mellitus with foot ulcer: Secondary | ICD-10-CM | POA: Diagnosis not present

## 2015-11-06 DIAGNOSIS — L97422 Non-pressure chronic ulcer of left heel and midfoot with fat layer exposed: Secondary | ICD-10-CM | POA: Diagnosis not present

## 2015-11-06 DIAGNOSIS — I110 Hypertensive heart disease with heart failure: Secondary | ICD-10-CM | POA: Diagnosis not present

## 2015-11-06 DIAGNOSIS — M1990 Unspecified osteoarthritis, unspecified site: Secondary | ICD-10-CM | POA: Diagnosis not present

## 2015-11-06 DIAGNOSIS — T8469XD Infection and inflammatory reaction due to internal fixation device of other site, subsequent encounter: Secondary | ICD-10-CM | POA: Diagnosis not present

## 2015-11-07 DIAGNOSIS — I110 Hypertensive heart disease with heart failure: Secondary | ICD-10-CM | POA: Diagnosis not present

## 2015-11-07 DIAGNOSIS — E11621 Type 2 diabetes mellitus with foot ulcer: Secondary | ICD-10-CM | POA: Diagnosis not present

## 2015-11-07 DIAGNOSIS — I5032 Chronic diastolic (congestive) heart failure: Secondary | ICD-10-CM | POA: Diagnosis not present

## 2015-11-07 DIAGNOSIS — T8469XD Infection and inflammatory reaction due to internal fixation device of other site, subsequent encounter: Secondary | ICD-10-CM | POA: Diagnosis not present

## 2015-11-07 DIAGNOSIS — M1990 Unspecified osteoarthritis, unspecified site: Secondary | ICD-10-CM | POA: Diagnosis not present

## 2015-11-07 DIAGNOSIS — L97422 Non-pressure chronic ulcer of left heel and midfoot with fat layer exposed: Secondary | ICD-10-CM | POA: Diagnosis not present

## 2015-11-08 DIAGNOSIS — M1990 Unspecified osteoarthritis, unspecified site: Secondary | ICD-10-CM | POA: Diagnosis not present

## 2015-11-08 DIAGNOSIS — T8469XD Infection and inflammatory reaction due to internal fixation device of other site, subsequent encounter: Secondary | ICD-10-CM | POA: Diagnosis not present

## 2015-11-08 DIAGNOSIS — L97422 Non-pressure chronic ulcer of left heel and midfoot with fat layer exposed: Secondary | ICD-10-CM | POA: Diagnosis not present

## 2015-11-08 DIAGNOSIS — I110 Hypertensive heart disease with heart failure: Secondary | ICD-10-CM | POA: Diagnosis not present

## 2015-11-08 DIAGNOSIS — E11621 Type 2 diabetes mellitus with foot ulcer: Secondary | ICD-10-CM | POA: Diagnosis not present

## 2015-11-08 DIAGNOSIS — I5032 Chronic diastolic (congestive) heart failure: Secondary | ICD-10-CM | POA: Diagnosis not present

## 2015-11-08 LAB — AEROBIC/ANAEROBIC CULTURE W GRAM STAIN (SURGICAL/DEEP WOUND)

## 2015-11-08 LAB — AEROBIC/ANAEROBIC CULTURE (SURGICAL/DEEP WOUND)

## 2015-11-10 DIAGNOSIS — L97422 Non-pressure chronic ulcer of left heel and midfoot with fat layer exposed: Secondary | ICD-10-CM | POA: Diagnosis not present

## 2015-11-10 DIAGNOSIS — I5032 Chronic diastolic (congestive) heart failure: Secondary | ICD-10-CM | POA: Diagnosis not present

## 2015-11-10 DIAGNOSIS — E11621 Type 2 diabetes mellitus with foot ulcer: Secondary | ICD-10-CM | POA: Diagnosis not present

## 2015-11-10 DIAGNOSIS — M1990 Unspecified osteoarthritis, unspecified site: Secondary | ICD-10-CM | POA: Diagnosis not present

## 2015-11-10 DIAGNOSIS — I110 Hypertensive heart disease with heart failure: Secondary | ICD-10-CM | POA: Diagnosis not present

## 2015-11-10 DIAGNOSIS — T8469XD Infection and inflammatory reaction due to internal fixation device of other site, subsequent encounter: Secondary | ICD-10-CM | POA: Diagnosis not present

## 2015-11-12 ENCOUNTER — Other Ambulatory Visit (INDEPENDENT_AMBULATORY_CARE_PROVIDER_SITE_OTHER): Payer: Self-pay | Admitting: Otolaryngology

## 2015-11-12 DIAGNOSIS — S92035D Nondisplaced avulsion fracture of tuberosity of left calcaneus, subsequent encounter for fracture with routine healing: Secondary | ICD-10-CM | POA: Diagnosis not present

## 2015-11-12 DIAGNOSIS — T84498D Other mechanical complication of other internal orthopedic devices, implants and grafts, subsequent encounter: Secondary | ICD-10-CM | POA: Diagnosis not present

## 2015-11-12 DIAGNOSIS — E041 Nontoxic single thyroid nodule: Secondary | ICD-10-CM

## 2015-11-15 DIAGNOSIS — L97422 Non-pressure chronic ulcer of left heel and midfoot with fat layer exposed: Secondary | ICD-10-CM | POA: Diagnosis not present

## 2015-11-15 DIAGNOSIS — T8469XD Infection and inflammatory reaction due to internal fixation device of other site, subsequent encounter: Secondary | ICD-10-CM | POA: Diagnosis not present

## 2015-11-15 DIAGNOSIS — E11621 Type 2 diabetes mellitus with foot ulcer: Secondary | ICD-10-CM | POA: Diagnosis not present

## 2015-11-15 DIAGNOSIS — M1990 Unspecified osteoarthritis, unspecified site: Secondary | ICD-10-CM | POA: Diagnosis not present

## 2015-11-15 DIAGNOSIS — I5032 Chronic diastolic (congestive) heart failure: Secondary | ICD-10-CM | POA: Diagnosis not present

## 2015-11-15 DIAGNOSIS — I110 Hypertensive heart disease with heart failure: Secondary | ICD-10-CM | POA: Diagnosis not present

## 2015-11-17 ENCOUNTER — Ambulatory Visit (HOSPITAL_COMMUNITY)
Admission: RE | Admit: 2015-11-17 | Discharge: 2015-11-17 | Disposition: A | Discharge status: Home / Self Care | Payer: Medicare Other | Source: Ambulatory Visit | Attending: Otolaryngology | Admitting: Otolaryngology

## 2015-11-17 DIAGNOSIS — E11621 Type 2 diabetes mellitus with foot ulcer: Secondary | ICD-10-CM | POA: Diagnosis not present

## 2015-11-17 DIAGNOSIS — M1990 Unspecified osteoarthritis, unspecified site: Secondary | ICD-10-CM | POA: Diagnosis not present

## 2015-11-17 DIAGNOSIS — E042 Nontoxic multinodular goiter: Secondary | ICD-10-CM | POA: Insufficient documentation

## 2015-11-17 DIAGNOSIS — I110 Hypertensive heart disease with heart failure: Secondary | ICD-10-CM | POA: Diagnosis not present

## 2015-11-17 DIAGNOSIS — E041 Nontoxic single thyroid nodule: Secondary | ICD-10-CM

## 2015-11-17 DIAGNOSIS — L97422 Non-pressure chronic ulcer of left heel and midfoot with fat layer exposed: Secondary | ICD-10-CM | POA: Diagnosis not present

## 2015-11-17 DIAGNOSIS — T8469XD Infection and inflammatory reaction due to internal fixation device of other site, subsequent encounter: Secondary | ICD-10-CM | POA: Diagnosis not present

## 2015-11-17 DIAGNOSIS — I5032 Chronic diastolic (congestive) heart failure: Secondary | ICD-10-CM | POA: Diagnosis not present

## 2015-11-18 DIAGNOSIS — S92035D Nondisplaced avulsion fracture of tuberosity of left calcaneus, subsequent encounter for fracture with routine healing: Secondary | ICD-10-CM | POA: Diagnosis not present

## 2015-11-18 DIAGNOSIS — L97421 Non-pressure chronic ulcer of left heel and midfoot limited to breakdown of skin: Secondary | ICD-10-CM | POA: Diagnosis not present

## 2015-11-18 DIAGNOSIS — T84498D Other mechanical complication of other internal orthopedic devices, implants and grafts, subsequent encounter: Secondary | ICD-10-CM | POA: Diagnosis not present

## 2015-11-19 DIAGNOSIS — I110 Hypertensive heart disease with heart failure: Secondary | ICD-10-CM | POA: Diagnosis not present

## 2015-11-19 DIAGNOSIS — I5032 Chronic diastolic (congestive) heart failure: Secondary | ICD-10-CM | POA: Diagnosis not present

## 2015-11-19 DIAGNOSIS — M1990 Unspecified osteoarthritis, unspecified site: Secondary | ICD-10-CM | POA: Diagnosis not present

## 2015-11-19 DIAGNOSIS — T8469XD Infection and inflammatory reaction due to internal fixation device of other site, subsequent encounter: Secondary | ICD-10-CM | POA: Diagnosis not present

## 2015-11-19 DIAGNOSIS — E11621 Type 2 diabetes mellitus with foot ulcer: Secondary | ICD-10-CM | POA: Diagnosis not present

## 2015-11-19 DIAGNOSIS — L97422 Non-pressure chronic ulcer of left heel and midfoot with fat layer exposed: Secondary | ICD-10-CM | POA: Diagnosis not present

## 2015-11-22 DIAGNOSIS — I110 Hypertensive heart disease with heart failure: Secondary | ICD-10-CM | POA: Diagnosis not present

## 2015-11-22 DIAGNOSIS — I5032 Chronic diastolic (congestive) heart failure: Secondary | ICD-10-CM | POA: Diagnosis not present

## 2015-11-22 DIAGNOSIS — L97422 Non-pressure chronic ulcer of left heel and midfoot with fat layer exposed: Secondary | ICD-10-CM | POA: Diagnosis not present

## 2015-11-22 DIAGNOSIS — M1990 Unspecified osteoarthritis, unspecified site: Secondary | ICD-10-CM | POA: Diagnosis not present

## 2015-11-22 DIAGNOSIS — E11621 Type 2 diabetes mellitus with foot ulcer: Secondary | ICD-10-CM | POA: Diagnosis not present

## 2015-11-22 DIAGNOSIS — T8469XD Infection and inflammatory reaction due to internal fixation device of other site, subsequent encounter: Secondary | ICD-10-CM | POA: Diagnosis not present

## 2015-11-24 DIAGNOSIS — E784 Other hyperlipidemia: Secondary | ICD-10-CM | POA: Diagnosis not present

## 2015-11-24 DIAGNOSIS — E042 Nontoxic multinodular goiter: Secondary | ICD-10-CM | POA: Diagnosis not present

## 2015-11-24 DIAGNOSIS — E1140 Type 2 diabetes mellitus with diabetic neuropathy, unspecified: Secondary | ICD-10-CM | POA: Diagnosis not present

## 2015-11-24 DIAGNOSIS — I10 Essential (primary) hypertension: Secondary | ICD-10-CM | POA: Diagnosis not present

## 2015-11-24 DIAGNOSIS — L97429 Non-pressure chronic ulcer of left heel and midfoot with unspecified severity: Secondary | ICD-10-CM | POA: Diagnosis not present

## 2015-11-24 DIAGNOSIS — D588 Other specified hereditary hemolytic anemias: Secondary | ICD-10-CM | POA: Diagnosis not present

## 2015-11-24 DIAGNOSIS — Z1389 Encounter for screening for other disorder: Secondary | ICD-10-CM | POA: Diagnosis not present

## 2015-11-24 DIAGNOSIS — N08 Glomerular disorders in diseases classified elsewhere: Secondary | ICD-10-CM | POA: Diagnosis not present

## 2015-11-24 DIAGNOSIS — F329 Major depressive disorder, single episode, unspecified: Secondary | ICD-10-CM | POA: Diagnosis not present

## 2015-11-24 DIAGNOSIS — I5033 Acute on chronic diastolic (congestive) heart failure: Secondary | ICD-10-CM | POA: Diagnosis not present

## 2015-11-24 DIAGNOSIS — E1142 Type 2 diabetes mellitus with diabetic polyneuropathy: Secondary | ICD-10-CM | POA: Diagnosis not present

## 2015-11-24 DIAGNOSIS — Z6830 Body mass index (BMI) 30.0-30.9, adult: Secondary | ICD-10-CM | POA: Diagnosis not present

## 2015-11-25 DIAGNOSIS — L97422 Non-pressure chronic ulcer of left heel and midfoot with fat layer exposed: Secondary | ICD-10-CM | POA: Diagnosis not present

## 2015-11-25 DIAGNOSIS — E11621 Type 2 diabetes mellitus with foot ulcer: Secondary | ICD-10-CM | POA: Diagnosis not present

## 2015-11-25 DIAGNOSIS — T8469XD Infection and inflammatory reaction due to internal fixation device of other site, subsequent encounter: Secondary | ICD-10-CM | POA: Diagnosis not present

## 2015-11-25 DIAGNOSIS — M1990 Unspecified osteoarthritis, unspecified site: Secondary | ICD-10-CM | POA: Diagnosis not present

## 2015-11-25 DIAGNOSIS — I5032 Chronic diastolic (congestive) heart failure: Secondary | ICD-10-CM | POA: Diagnosis not present

## 2015-11-25 DIAGNOSIS — I110 Hypertensive heart disease with heart failure: Secondary | ICD-10-CM | POA: Diagnosis not present

## 2015-11-26 DIAGNOSIS — Z4789 Encounter for other orthopedic aftercare: Secondary | ICD-10-CM | POA: Diagnosis not present

## 2015-11-26 DIAGNOSIS — T84498D Other mechanical complication of other internal orthopedic devices, implants and grafts, subsequent encounter: Secondary | ICD-10-CM | POA: Diagnosis not present

## 2015-11-27 DIAGNOSIS — L97422 Non-pressure chronic ulcer of left heel and midfoot with fat layer exposed: Secondary | ICD-10-CM | POA: Diagnosis not present

## 2015-11-27 DIAGNOSIS — I110 Hypertensive heart disease with heart failure: Secondary | ICD-10-CM | POA: Diagnosis not present

## 2015-11-27 DIAGNOSIS — I5032 Chronic diastolic (congestive) heart failure: Secondary | ICD-10-CM | POA: Diagnosis not present

## 2015-11-27 DIAGNOSIS — E11621 Type 2 diabetes mellitus with foot ulcer: Secondary | ICD-10-CM | POA: Diagnosis not present

## 2015-11-27 DIAGNOSIS — M1990 Unspecified osteoarthritis, unspecified site: Secondary | ICD-10-CM | POA: Diagnosis not present

## 2015-11-27 DIAGNOSIS — T8469XD Infection and inflammatory reaction due to internal fixation device of other site, subsequent encounter: Secondary | ICD-10-CM | POA: Diagnosis not present

## 2015-11-29 DIAGNOSIS — L97422 Non-pressure chronic ulcer of left heel and midfoot with fat layer exposed: Secondary | ICD-10-CM | POA: Diagnosis not present

## 2015-11-29 DIAGNOSIS — I5032 Chronic diastolic (congestive) heart failure: Secondary | ICD-10-CM | POA: Diagnosis not present

## 2015-11-29 DIAGNOSIS — M1990 Unspecified osteoarthritis, unspecified site: Secondary | ICD-10-CM | POA: Diagnosis not present

## 2015-11-29 DIAGNOSIS — I110 Hypertensive heart disease with heart failure: Secondary | ICD-10-CM | POA: Diagnosis not present

## 2015-11-29 DIAGNOSIS — T8469XD Infection and inflammatory reaction due to internal fixation device of other site, subsequent encounter: Secondary | ICD-10-CM | POA: Diagnosis not present

## 2015-11-29 DIAGNOSIS — E11621 Type 2 diabetes mellitus with foot ulcer: Secondary | ICD-10-CM | POA: Diagnosis not present

## 2015-12-01 DIAGNOSIS — I110 Hypertensive heart disease with heart failure: Secondary | ICD-10-CM | POA: Diagnosis not present

## 2015-12-01 DIAGNOSIS — I5032 Chronic diastolic (congestive) heart failure: Secondary | ICD-10-CM | POA: Diagnosis not present

## 2015-12-01 DIAGNOSIS — L97422 Non-pressure chronic ulcer of left heel and midfoot with fat layer exposed: Secondary | ICD-10-CM | POA: Diagnosis not present

## 2015-12-01 DIAGNOSIS — M1990 Unspecified osteoarthritis, unspecified site: Secondary | ICD-10-CM | POA: Diagnosis not present

## 2015-12-01 DIAGNOSIS — E11621 Type 2 diabetes mellitus with foot ulcer: Secondary | ICD-10-CM | POA: Diagnosis not present

## 2015-12-01 DIAGNOSIS — T8469XD Infection and inflammatory reaction due to internal fixation device of other site, subsequent encounter: Secondary | ICD-10-CM | POA: Diagnosis not present

## 2015-12-02 ENCOUNTER — Ambulatory Visit (INDEPENDENT_AMBULATORY_CARE_PROVIDER_SITE_OTHER): Payer: Medicare Other | Admitting: Otolaryngology

## 2015-12-02 DIAGNOSIS — H608X3 Other otitis externa, bilateral: Secondary | ICD-10-CM

## 2015-12-02 DIAGNOSIS — D440 Neoplasm of uncertain behavior of thyroid gland: Secondary | ICD-10-CM | POA: Diagnosis not present

## 2015-12-02 DIAGNOSIS — Z4789 Encounter for other orthopedic aftercare: Secondary | ICD-10-CM | POA: Diagnosis not present

## 2015-12-02 DIAGNOSIS — K1230 Oral mucositis (ulcerative), unspecified: Secondary | ICD-10-CM

## 2015-12-02 DIAGNOSIS — T84498D Other mechanical complication of other internal orthopedic devices, implants and grafts, subsequent encounter: Secondary | ICD-10-CM | POA: Diagnosis not present

## 2015-12-03 DIAGNOSIS — T8469XD Infection and inflammatory reaction due to internal fixation device of other site, subsequent encounter: Secondary | ICD-10-CM | POA: Diagnosis not present

## 2015-12-03 DIAGNOSIS — I110 Hypertensive heart disease with heart failure: Secondary | ICD-10-CM | POA: Diagnosis not present

## 2015-12-03 DIAGNOSIS — I5032 Chronic diastolic (congestive) heart failure: Secondary | ICD-10-CM | POA: Diagnosis not present

## 2015-12-03 DIAGNOSIS — E11621 Type 2 diabetes mellitus with foot ulcer: Secondary | ICD-10-CM | POA: Diagnosis not present

## 2015-12-03 DIAGNOSIS — M1990 Unspecified osteoarthritis, unspecified site: Secondary | ICD-10-CM | POA: Diagnosis not present

## 2015-12-03 DIAGNOSIS — L97422 Non-pressure chronic ulcer of left heel and midfoot with fat layer exposed: Secondary | ICD-10-CM | POA: Diagnosis not present

## 2015-12-06 ENCOUNTER — Other Ambulatory Visit: Payer: Medicare Other

## 2015-12-06 DIAGNOSIS — T8469XD Infection and inflammatory reaction due to internal fixation device of other site, subsequent encounter: Secondary | ICD-10-CM | POA: Diagnosis not present

## 2015-12-06 DIAGNOSIS — I110 Hypertensive heart disease with heart failure: Secondary | ICD-10-CM | POA: Diagnosis not present

## 2015-12-06 DIAGNOSIS — M1990 Unspecified osteoarthritis, unspecified site: Secondary | ICD-10-CM | POA: Diagnosis not present

## 2015-12-06 DIAGNOSIS — I5032 Chronic diastolic (congestive) heart failure: Secondary | ICD-10-CM | POA: Diagnosis not present

## 2015-12-06 DIAGNOSIS — E11621 Type 2 diabetes mellitus with foot ulcer: Secondary | ICD-10-CM | POA: Diagnosis not present

## 2015-12-06 DIAGNOSIS — L97422 Non-pressure chronic ulcer of left heel and midfoot with fat layer exposed: Secondary | ICD-10-CM | POA: Diagnosis not present

## 2015-12-08 DIAGNOSIS — I110 Hypertensive heart disease with heart failure: Secondary | ICD-10-CM | POA: Diagnosis not present

## 2015-12-08 DIAGNOSIS — L97422 Non-pressure chronic ulcer of left heel and midfoot with fat layer exposed: Secondary | ICD-10-CM | POA: Diagnosis not present

## 2015-12-08 DIAGNOSIS — E11621 Type 2 diabetes mellitus with foot ulcer: Secondary | ICD-10-CM | POA: Diagnosis not present

## 2015-12-08 DIAGNOSIS — T8469XD Infection and inflammatory reaction due to internal fixation device of other site, subsequent encounter: Secondary | ICD-10-CM | POA: Diagnosis not present

## 2015-12-08 DIAGNOSIS — I5032 Chronic diastolic (congestive) heart failure: Secondary | ICD-10-CM | POA: Diagnosis not present

## 2015-12-08 DIAGNOSIS — M1990 Unspecified osteoarthritis, unspecified site: Secondary | ICD-10-CM | POA: Diagnosis not present

## 2015-12-09 DIAGNOSIS — Z4789 Encounter for other orthopedic aftercare: Secondary | ICD-10-CM | POA: Diagnosis not present

## 2015-12-10 DIAGNOSIS — I5032 Chronic diastolic (congestive) heart failure: Secondary | ICD-10-CM | POA: Diagnosis not present

## 2015-12-10 DIAGNOSIS — E11621 Type 2 diabetes mellitus with foot ulcer: Secondary | ICD-10-CM | POA: Diagnosis not present

## 2015-12-10 DIAGNOSIS — I110 Hypertensive heart disease with heart failure: Secondary | ICD-10-CM | POA: Diagnosis not present

## 2015-12-10 DIAGNOSIS — T8469XD Infection and inflammatory reaction due to internal fixation device of other site, subsequent encounter: Secondary | ICD-10-CM | POA: Diagnosis not present

## 2015-12-10 DIAGNOSIS — M1990 Unspecified osteoarthritis, unspecified site: Secondary | ICD-10-CM | POA: Diagnosis not present

## 2015-12-10 DIAGNOSIS — L97422 Non-pressure chronic ulcer of left heel and midfoot with fat layer exposed: Secondary | ICD-10-CM | POA: Diagnosis not present

## 2015-12-13 DIAGNOSIS — L97422 Non-pressure chronic ulcer of left heel and midfoot with fat layer exposed: Secondary | ICD-10-CM | POA: Diagnosis not present

## 2015-12-13 DIAGNOSIS — I110 Hypertensive heart disease with heart failure: Secondary | ICD-10-CM | POA: Diagnosis not present

## 2015-12-13 DIAGNOSIS — I5032 Chronic diastolic (congestive) heart failure: Secondary | ICD-10-CM | POA: Diagnosis not present

## 2015-12-13 DIAGNOSIS — T8469XD Infection and inflammatory reaction due to internal fixation device of other site, subsequent encounter: Secondary | ICD-10-CM | POA: Diagnosis not present

## 2015-12-13 DIAGNOSIS — E11621 Type 2 diabetes mellitus with foot ulcer: Secondary | ICD-10-CM | POA: Diagnosis not present

## 2015-12-13 DIAGNOSIS — M1990 Unspecified osteoarthritis, unspecified site: Secondary | ICD-10-CM | POA: Diagnosis not present

## 2015-12-15 DIAGNOSIS — I5032 Chronic diastolic (congestive) heart failure: Secondary | ICD-10-CM | POA: Diagnosis not present

## 2015-12-15 DIAGNOSIS — T8469XD Infection and inflammatory reaction due to internal fixation device of other site, subsequent encounter: Secondary | ICD-10-CM | POA: Diagnosis not present

## 2015-12-15 DIAGNOSIS — L97422 Non-pressure chronic ulcer of left heel and midfoot with fat layer exposed: Secondary | ICD-10-CM | POA: Diagnosis not present

## 2015-12-15 DIAGNOSIS — I110 Hypertensive heart disease with heart failure: Secondary | ICD-10-CM | POA: Diagnosis not present

## 2015-12-15 DIAGNOSIS — M1990 Unspecified osteoarthritis, unspecified site: Secondary | ICD-10-CM | POA: Diagnosis not present

## 2015-12-15 DIAGNOSIS — E11621 Type 2 diabetes mellitus with foot ulcer: Secondary | ICD-10-CM | POA: Diagnosis not present

## 2015-12-17 DIAGNOSIS — I110 Hypertensive heart disease with heart failure: Secondary | ICD-10-CM | POA: Diagnosis not present

## 2015-12-17 DIAGNOSIS — T8469XD Infection and inflammatory reaction due to internal fixation device of other site, subsequent encounter: Secondary | ICD-10-CM | POA: Diagnosis not present

## 2015-12-17 DIAGNOSIS — Z79899 Other long term (current) drug therapy: Secondary | ICD-10-CM | POA: Diagnosis not present

## 2015-12-17 DIAGNOSIS — L97422 Non-pressure chronic ulcer of left heel and midfoot with fat layer exposed: Secondary | ICD-10-CM | POA: Diagnosis not present

## 2015-12-17 DIAGNOSIS — E119 Type 2 diabetes mellitus without complications: Secondary | ICD-10-CM | POA: Diagnosis not present

## 2015-12-17 DIAGNOSIS — E785 Hyperlipidemia, unspecified: Secondary | ICD-10-CM | POA: Diagnosis not present

## 2015-12-17 DIAGNOSIS — M1990 Unspecified osteoarthritis, unspecified site: Secondary | ICD-10-CM | POA: Diagnosis not present

## 2015-12-17 DIAGNOSIS — E11621 Type 2 diabetes mellitus with foot ulcer: Secondary | ICD-10-CM | POA: Diagnosis not present

## 2015-12-17 DIAGNOSIS — I5032 Chronic diastolic (congestive) heart failure: Secondary | ICD-10-CM | POA: Diagnosis not present

## 2015-12-20 DIAGNOSIS — M1990 Unspecified osteoarthritis, unspecified site: Secondary | ICD-10-CM | POA: Diagnosis not present

## 2015-12-20 DIAGNOSIS — E11621 Type 2 diabetes mellitus with foot ulcer: Secondary | ICD-10-CM | POA: Diagnosis not present

## 2015-12-20 DIAGNOSIS — L97422 Non-pressure chronic ulcer of left heel and midfoot with fat layer exposed: Secondary | ICD-10-CM | POA: Diagnosis not present

## 2015-12-20 DIAGNOSIS — I5032 Chronic diastolic (congestive) heart failure: Secondary | ICD-10-CM | POA: Diagnosis not present

## 2015-12-20 DIAGNOSIS — T8469XD Infection and inflammatory reaction due to internal fixation device of other site, subsequent encounter: Secondary | ICD-10-CM | POA: Diagnosis not present

## 2015-12-20 DIAGNOSIS — I110 Hypertensive heart disease with heart failure: Secondary | ICD-10-CM | POA: Diagnosis not present

## 2015-12-22 DIAGNOSIS — T8469XD Infection and inflammatory reaction due to internal fixation device of other site, subsequent encounter: Secondary | ICD-10-CM | POA: Diagnosis not present

## 2015-12-22 DIAGNOSIS — M1990 Unspecified osteoarthritis, unspecified site: Secondary | ICD-10-CM | POA: Diagnosis not present

## 2015-12-22 DIAGNOSIS — I110 Hypertensive heart disease with heart failure: Secondary | ICD-10-CM | POA: Diagnosis not present

## 2015-12-22 DIAGNOSIS — L97422 Non-pressure chronic ulcer of left heel and midfoot with fat layer exposed: Secondary | ICD-10-CM | POA: Diagnosis not present

## 2015-12-22 DIAGNOSIS — I5032 Chronic diastolic (congestive) heart failure: Secondary | ICD-10-CM | POA: Diagnosis not present

## 2015-12-22 DIAGNOSIS — E11621 Type 2 diabetes mellitus with foot ulcer: Secondary | ICD-10-CM | POA: Diagnosis not present

## 2015-12-23 DIAGNOSIS — N390 Urinary tract infection, site not specified: Secondary | ICD-10-CM | POA: Diagnosis not present

## 2015-12-23 DIAGNOSIS — I10 Essential (primary) hypertension: Secondary | ICD-10-CM | POA: Diagnosis not present

## 2015-12-23 DIAGNOSIS — Z4789 Encounter for other orthopedic aftercare: Secondary | ICD-10-CM | POA: Diagnosis not present

## 2015-12-23 DIAGNOSIS — Z9889 Other specified postprocedural states: Secondary | ICD-10-CM | POA: Diagnosis not present

## 2015-12-23 DIAGNOSIS — I5032 Chronic diastolic (congestive) heart failure: Secondary | ICD-10-CM | POA: Diagnosis not present

## 2015-12-27 DIAGNOSIS — L97422 Non-pressure chronic ulcer of left heel and midfoot with fat layer exposed: Secondary | ICD-10-CM | POA: Diagnosis not present

## 2015-12-27 DIAGNOSIS — I110 Hypertensive heart disease with heart failure: Secondary | ICD-10-CM | POA: Diagnosis not present

## 2015-12-27 DIAGNOSIS — E11621 Type 2 diabetes mellitus with foot ulcer: Secondary | ICD-10-CM | POA: Diagnosis not present

## 2015-12-27 DIAGNOSIS — T8469XD Infection and inflammatory reaction due to internal fixation device of other site, subsequent encounter: Secondary | ICD-10-CM | POA: Diagnosis not present

## 2015-12-27 DIAGNOSIS — I5032 Chronic diastolic (congestive) heart failure: Secondary | ICD-10-CM | POA: Diagnosis not present

## 2015-12-27 DIAGNOSIS — M1990 Unspecified osteoarthritis, unspecified site: Secondary | ICD-10-CM | POA: Diagnosis not present

## 2015-12-29 DIAGNOSIS — M1990 Unspecified osteoarthritis, unspecified site: Secondary | ICD-10-CM | POA: Diagnosis not present

## 2015-12-29 DIAGNOSIS — I5032 Chronic diastolic (congestive) heart failure: Secondary | ICD-10-CM | POA: Diagnosis not present

## 2015-12-29 DIAGNOSIS — I110 Hypertensive heart disease with heart failure: Secondary | ICD-10-CM | POA: Diagnosis not present

## 2015-12-29 DIAGNOSIS — L97422 Non-pressure chronic ulcer of left heel and midfoot with fat layer exposed: Secondary | ICD-10-CM | POA: Diagnosis not present

## 2015-12-29 DIAGNOSIS — T8469XD Infection and inflammatory reaction due to internal fixation device of other site, subsequent encounter: Secondary | ICD-10-CM | POA: Diagnosis not present

## 2015-12-29 DIAGNOSIS — E11621 Type 2 diabetes mellitus with foot ulcer: Secondary | ICD-10-CM | POA: Diagnosis not present

## 2015-12-31 DIAGNOSIS — M1990 Unspecified osteoarthritis, unspecified site: Secondary | ICD-10-CM | POA: Diagnosis not present

## 2015-12-31 DIAGNOSIS — L97422 Non-pressure chronic ulcer of left heel and midfoot with fat layer exposed: Secondary | ICD-10-CM | POA: Diagnosis not present

## 2015-12-31 DIAGNOSIS — E11621 Type 2 diabetes mellitus with foot ulcer: Secondary | ICD-10-CM | POA: Diagnosis not present

## 2015-12-31 DIAGNOSIS — I5032 Chronic diastolic (congestive) heart failure: Secondary | ICD-10-CM | POA: Diagnosis not present

## 2015-12-31 DIAGNOSIS — T8469XD Infection and inflammatory reaction due to internal fixation device of other site, subsequent encounter: Secondary | ICD-10-CM | POA: Diagnosis not present

## 2015-12-31 DIAGNOSIS — I110 Hypertensive heart disease with heart failure: Secondary | ICD-10-CM | POA: Diagnosis not present

## 2016-01-05 DIAGNOSIS — L97422 Non-pressure chronic ulcer of left heel and midfoot with fat layer exposed: Secondary | ICD-10-CM | POA: Diagnosis not present

## 2016-01-05 DIAGNOSIS — Z794 Long term (current) use of insulin: Secondary | ICD-10-CM | POA: Diagnosis not present

## 2016-01-05 DIAGNOSIS — I5032 Chronic diastolic (congestive) heart failure: Secondary | ICD-10-CM | POA: Diagnosis not present

## 2016-01-05 DIAGNOSIS — E11621 Type 2 diabetes mellitus with foot ulcer: Secondary | ICD-10-CM | POA: Diagnosis not present

## 2016-01-05 DIAGNOSIS — Z4800 Encounter for change or removal of nonsurgical wound dressing: Secondary | ICD-10-CM | POA: Diagnosis not present

## 2016-01-05 DIAGNOSIS — I110 Hypertensive heart disease with heart failure: Secondary | ICD-10-CM | POA: Diagnosis not present

## 2016-01-06 DIAGNOSIS — T84498D Other mechanical complication of other internal orthopedic devices, implants and grafts, subsequent encounter: Secondary | ICD-10-CM | POA: Diagnosis not present

## 2016-01-06 DIAGNOSIS — Z4789 Encounter for other orthopedic aftercare: Secondary | ICD-10-CM | POA: Diagnosis not present

## 2016-01-11 DIAGNOSIS — Z4800 Encounter for change or removal of nonsurgical wound dressing: Secondary | ICD-10-CM | POA: Diagnosis not present

## 2016-01-11 DIAGNOSIS — I5032 Chronic diastolic (congestive) heart failure: Secondary | ICD-10-CM | POA: Diagnosis not present

## 2016-01-11 DIAGNOSIS — I110 Hypertensive heart disease with heart failure: Secondary | ICD-10-CM | POA: Diagnosis not present

## 2016-01-11 DIAGNOSIS — L97422 Non-pressure chronic ulcer of left heel and midfoot with fat layer exposed: Secondary | ICD-10-CM | POA: Diagnosis not present

## 2016-01-11 DIAGNOSIS — E11621 Type 2 diabetes mellitus with foot ulcer: Secondary | ICD-10-CM | POA: Diagnosis not present

## 2016-01-11 DIAGNOSIS — Z794 Long term (current) use of insulin: Secondary | ICD-10-CM | POA: Diagnosis not present

## 2016-01-13 DIAGNOSIS — L97422 Non-pressure chronic ulcer of left heel and midfoot with fat layer exposed: Secondary | ICD-10-CM | POA: Diagnosis not present

## 2016-01-13 DIAGNOSIS — Z4800 Encounter for change or removal of nonsurgical wound dressing: Secondary | ICD-10-CM | POA: Diagnosis not present

## 2016-01-13 DIAGNOSIS — Z794 Long term (current) use of insulin: Secondary | ICD-10-CM | POA: Diagnosis not present

## 2016-01-13 DIAGNOSIS — E11621 Type 2 diabetes mellitus with foot ulcer: Secondary | ICD-10-CM | POA: Diagnosis not present

## 2016-01-13 DIAGNOSIS — I110 Hypertensive heart disease with heart failure: Secondary | ICD-10-CM | POA: Diagnosis not present

## 2016-01-13 DIAGNOSIS — I5032 Chronic diastolic (congestive) heart failure: Secondary | ICD-10-CM | POA: Diagnosis not present

## 2016-01-17 ENCOUNTER — Inpatient Hospital Stay (HOSPITAL_COMMUNITY)
Admission: EM | Admit: 2016-01-17 | Discharge: 2016-01-21 | DRG: 101 | Disposition: A | Discharge status: Home Health | Payer: Medicare Other | Attending: Internal Medicine | Admitting: Internal Medicine

## 2016-01-17 ENCOUNTER — Emergency Department (HOSPITAL_COMMUNITY): Payer: Medicare Other

## 2016-01-17 ENCOUNTER — Encounter (HOSPITAL_COMMUNITY): Payer: Self-pay

## 2016-01-17 DIAGNOSIS — R52 Pain, unspecified: Secondary | ICD-10-CM

## 2016-01-17 DIAGNOSIS — E538 Deficiency of other specified B group vitamins: Secondary | ICD-10-CM | POA: Diagnosis present

## 2016-01-17 DIAGNOSIS — Z794 Long term (current) use of insulin: Secondary | ICD-10-CM

## 2016-01-17 DIAGNOSIS — R269 Unspecified abnormalities of gait and mobility: Secondary | ICD-10-CM | POA: Diagnosis not present

## 2016-01-17 DIAGNOSIS — S0181XA Laceration without foreign body of other part of head, initial encounter: Secondary | ICD-10-CM | POA: Diagnosis present

## 2016-01-17 DIAGNOSIS — S92424A Nondisplaced fracture of distal phalanx of right great toe, initial encounter for closed fracture: Secondary | ICD-10-CM | POA: Diagnosis not present

## 2016-01-17 DIAGNOSIS — Z823 Family history of stroke: Secondary | ICD-10-CM

## 2016-01-17 DIAGNOSIS — E11621 Type 2 diabetes mellitus with foot ulcer: Secondary | ICD-10-CM | POA: Diagnosis not present

## 2016-01-17 DIAGNOSIS — M109 Gout, unspecified: Secondary | ICD-10-CM | POA: Diagnosis present

## 2016-01-17 DIAGNOSIS — I110 Hypertensive heart disease with heart failure: Secondary | ICD-10-CM | POA: Diagnosis present

## 2016-01-17 DIAGNOSIS — E860 Dehydration: Secondary | ICD-10-CM | POA: Diagnosis present

## 2016-01-17 DIAGNOSIS — R569 Unspecified convulsions: Principal | ICD-10-CM | POA: Diagnosis present

## 2016-01-17 DIAGNOSIS — E039 Hypothyroidism, unspecified: Secondary | ICD-10-CM | POA: Diagnosis present

## 2016-01-17 DIAGNOSIS — E785 Hyperlipidemia, unspecified: Secondary | ICD-10-CM | POA: Diagnosis present

## 2016-01-17 DIAGNOSIS — S0003XA Contusion of scalp, initial encounter: Secondary | ICD-10-CM | POA: Diagnosis present

## 2016-01-17 DIAGNOSIS — S92401A Displaced unspecified fracture of right great toe, initial encounter for closed fracture: Secondary | ICD-10-CM | POA: Diagnosis present

## 2016-01-17 DIAGNOSIS — S92414A Nondisplaced fracture of proximal phalanx of right great toe, initial encounter for closed fracture: Secondary | ICD-10-CM | POA: Diagnosis not present

## 2016-01-17 DIAGNOSIS — E1310 Other specified diabetes mellitus with ketoacidosis without coma: Secondary | ICD-10-CM

## 2016-01-17 DIAGNOSIS — E869 Volume depletion, unspecified: Secondary | ICD-10-CM | POA: Diagnosis not present

## 2016-01-17 DIAGNOSIS — K219 Gastro-esophageal reflux disease without esophagitis: Secondary | ICD-10-CM | POA: Diagnosis present

## 2016-01-17 DIAGNOSIS — Z8249 Family history of ischemic heart disease and other diseases of the circulatory system: Secondary | ICD-10-CM | POA: Diagnosis not present

## 2016-01-17 DIAGNOSIS — Y92009 Unspecified place in unspecified non-institutional (private) residence as the place of occurrence of the external cause: Secondary | ICD-10-CM | POA: Diagnosis not present

## 2016-01-17 DIAGNOSIS — S92416A Nondisplaced fracture of proximal phalanx of unspecified great toe, initial encounter for closed fracture: Secondary | ICD-10-CM

## 2016-01-17 DIAGNOSIS — L97422 Non-pressure chronic ulcer of left heel and midfoot with fat layer exposed: Secondary | ICD-10-CM | POA: Diagnosis not present

## 2016-01-17 DIAGNOSIS — Z825 Family history of asthma and other chronic lower respiratory diseases: Secondary | ICD-10-CM

## 2016-01-17 DIAGNOSIS — Z4800 Encounter for change or removal of nonsurgical wound dressing: Secondary | ICD-10-CM | POA: Diagnosis not present

## 2016-01-17 DIAGNOSIS — W19XXXA Unspecified fall, initial encounter: Secondary | ICD-10-CM | POA: Diagnosis present

## 2016-01-17 DIAGNOSIS — G9340 Encephalopathy, unspecified: Secondary | ICD-10-CM | POA: Diagnosis not present

## 2016-01-17 DIAGNOSIS — R739 Hyperglycemia, unspecified: Secondary | ICD-10-CM

## 2016-01-17 DIAGNOSIS — E875 Hyperkalemia: Secondary | ICD-10-CM | POA: Diagnosis present

## 2016-01-17 DIAGNOSIS — I482 Chronic atrial fibrillation: Secondary | ICD-10-CM | POA: Diagnosis not present

## 2016-01-17 DIAGNOSIS — R4182 Altered mental status, unspecified: Secondary | ICD-10-CM | POA: Diagnosis not present

## 2016-01-17 DIAGNOSIS — E1165 Type 2 diabetes mellitus with hyperglycemia: Secondary | ICD-10-CM | POA: Diagnosis not present

## 2016-01-17 DIAGNOSIS — E1100 Type 2 diabetes mellitus with hyperosmolarity without nonketotic hyperglycemic-hyperosmolar coma (NKHHC): Secondary | ICD-10-CM | POA: Diagnosis not present

## 2016-01-17 DIAGNOSIS — S0990XA Unspecified injury of head, initial encounter: Secondary | ICD-10-CM | POA: Diagnosis not present

## 2016-01-17 DIAGNOSIS — E119 Type 2 diabetes mellitus without complications: Secondary | ICD-10-CM | POA: Diagnosis not present

## 2016-01-17 DIAGNOSIS — I5032 Chronic diastolic (congestive) heart failure: Secondary | ICD-10-CM | POA: Diagnosis present

## 2016-01-17 DIAGNOSIS — Z833 Family history of diabetes mellitus: Secondary | ICD-10-CM

## 2016-01-17 DIAGNOSIS — S299XXA Unspecified injury of thorax, initial encounter: Secondary | ICD-10-CM | POA: Diagnosis not present

## 2016-01-17 LAB — APTT: APTT: 29 s (ref 24–36)

## 2016-01-17 LAB — I-STAT CHEM 8, ED
BUN: 36 mg/dL — AB (ref 6–20)
CHLORIDE: 102 mmol/L (ref 101–111)
Calcium, Ion: 1.19 mmol/L (ref 1.12–1.23)
Creatinine, Ser: 1 mg/dL (ref 0.44–1.00)
Glucose, Bld: 558 mg/dL (ref 65–99)
HEMATOCRIT: 29 % — AB (ref 36.0–46.0)
Hemoglobin: 9.9 g/dL — ABNORMAL LOW (ref 12.0–15.0)
POTASSIUM: 5.5 mmol/L — AB (ref 3.5–5.1)
SODIUM: 134 mmol/L — AB (ref 135–145)
TCO2: 19 mmol/L (ref 0–100)

## 2016-01-17 LAB — DIFFERENTIAL
BASOS ABS: 0 10*3/uL (ref 0.0–0.1)
Basophils Relative: 0 %
EOS ABS: 0 10*3/uL (ref 0.0–0.7)
EOS PCT: 1 %
LYMPHS ABS: 0.4 10*3/uL — AB (ref 0.7–4.0)
LYMPHS PCT: 8 %
MONOS PCT: 7 %
Monocytes Absolute: 0.4 10*3/uL (ref 0.1–1.0)
NEUTROS ABS: 4.9 10*3/uL (ref 1.7–7.7)
Neutrophils Relative %: 84 %

## 2016-01-17 LAB — URINALYSIS, ROUTINE W REFLEX MICROSCOPIC
Bilirubin Urine: NEGATIVE
Glucose, UA: >1000 mg/dL — AB
Ketones, ur: 15 mg/dL — AB
Leukocytes, UA: NEGATIVE
NITRITE: NEGATIVE
PH: 5.5 (ref 5.0–8.0)
Protein, ur: NEGATIVE mg/dL
SPECIFIC GRAVITY, URINE: 1.02 (ref 1.005–1.030)

## 2016-01-17 LAB — CBC
HEMATOCRIT: 29.5 % — AB (ref 36.0–46.0)
HEMOGLOBIN: 10.4 g/dL — AB (ref 12.0–15.0)
MCH: 29.1 pg (ref 26.0–34.0)
MCHC: 35.3 g/dL (ref 30.0–36.0)
MCV: 82.4 fL (ref 78.0–100.0)
Platelets: 149 10*3/uL — ABNORMAL LOW (ref 150–400)
RBC: 3.58 MIL/uL — ABNORMAL LOW (ref 3.87–5.11)
RDW: 14.2 % (ref 11.5–15.5)
WBC: 5.7 10*3/uL (ref 4.0–10.5)

## 2016-01-17 LAB — COMPREHENSIVE METABOLIC PANEL
ALBUMIN: 3.6 g/dL (ref 3.5–5.0)
ALK PHOS: 169 U/L — AB (ref 38–126)
ALT: 12 U/L — ABNORMAL LOW (ref 14–54)
ANION GAP: 9 (ref 5–15)
AST: 16 U/L (ref 15–41)
BILIRUBIN TOTAL: 0.9 mg/dL (ref 0.3–1.2)
BUN: 39 mg/dL — AB (ref 6–20)
CALCIUM: 8.1 mg/dL — AB (ref 8.9–10.3)
CO2: 19 mmol/L — ABNORMAL LOW (ref 22–32)
Chloride: 102 mmol/L (ref 101–111)
Creatinine, Ser: 1.24 mg/dL — ABNORMAL HIGH (ref 0.44–1.00)
GFR calc Af Amer: 49 mL/min — ABNORMAL LOW (ref >60)
GFR, EST NON AFRICAN AMERICAN: 43 mL/min — AB (ref >60)
GLUCOSE: 565 mg/dL — AB (ref 65–99)
POTASSIUM: 5.3 mmol/L — AB (ref 3.5–5.1)
Sodium: 130 mmol/L — ABNORMAL LOW (ref 135–145)
TOTAL PROTEIN: 6.1 g/dL — AB (ref 6.5–8.1)

## 2016-01-17 LAB — URINE MICROSCOPIC-ADD ON

## 2016-01-17 LAB — GLUCOSE, CAPILLARY: Glucose-Capillary: 99 mg/dL (ref 65–99)

## 2016-01-17 LAB — TSH: TSH: 1.655 u[IU]/mL (ref 0.350–4.500)

## 2016-01-17 LAB — CBG MONITORING, ED
GLUCOSE-CAPILLARY: 393 mg/dL — AB (ref 65–99)
Glucose-Capillary: 497 mg/dL — ABNORMAL HIGH (ref 65–99)

## 2016-01-17 LAB — ETHANOL: Alcohol, Ethyl (B): <5 mg/dL (ref <5)

## 2016-01-17 LAB — MAGNESIUM: Magnesium: 2.3 mg/dL (ref 1.7–2.4)

## 2016-01-17 LAB — PROTIME-INR
INR: 0.98
Prothrombin Time: 13 seconds (ref 11.4–15.2)

## 2016-01-17 LAB — CK: CK TOTAL: 24 U/L — AB (ref 38–234)

## 2016-01-17 LAB — RAPID URINE DRUG SCREEN, HOSP PERFORMED
Amphetamines: NOT DETECTED
Barbiturates: NOT DETECTED
Benzodiazepines: NOT DETECTED
Cocaine: NOT DETECTED
OPIATES: NOT DETECTED
Tetrahydrocannabinol: NOT DETECTED

## 2016-01-17 LAB — TROPONIN I

## 2016-01-17 LAB — PHOSPHORUS: Phosphorus: 3.7 mg/dL (ref 2.5–4.6)

## 2016-01-17 MED ORDER — LEVOTHYROXINE SODIUM 25 MCG PO TABS
125.0000 ug | ORAL_TABLET | ORAL | Status: DC
  Administered 2016-01-18 – 2016-01-21 (×4): 125 ug via ORAL
  Filled 2016-01-17 (×4): qty 1

## 2016-01-17 MED ORDER — LEVOTHYROXINE SODIUM 75 MCG PO TABS: 187.5000 ug | ORAL_TABLET | ORAL | Status: DC

## 2016-01-17 MED ORDER — PAROXETINE HCL 20 MG PO TABS
40.0000 mg | ORAL_TABLET | Freq: Every day | ORAL | Status: DC
  Administered 2016-01-17 – 2016-01-21 (×5): 40 mg via ORAL
  Filled 2016-01-17 (×5): qty 2

## 2016-01-17 MED ORDER — INSULIN ASPART 100 UNIT/ML ~~LOC~~ SOLN
10.0000 [IU] | Freq: Once | SUBCUTANEOUS | Status: AC
  Administered 2016-01-17: 10 [IU] via SUBCUTANEOUS
  Filled 2016-01-17: qty 1

## 2016-01-17 MED ORDER — INSULIN ASPART 100 UNIT/ML ~~LOC~~ SOLN
0.0000 [IU] | SUBCUTANEOUS | Status: DC
  Administered 2016-01-18 (×2): 11 [IU] via SUBCUTANEOUS
  Administered 2016-01-18 (×2): 2 [IU] via SUBCUTANEOUS
  Administered 2016-01-19 (×2): 3 [IU] via SUBCUTANEOUS
  Administered 2016-01-19: 5 [IU] via SUBCUTANEOUS
  Administered 2016-01-20 (×4): 3 [IU] via SUBCUTANEOUS
  Administered 2016-01-20: 11 [IU] via SUBCUTANEOUS
  Administered 2016-01-20: 3 [IU] via SUBCUTANEOUS
  Administered 2016-01-21: 2 [IU] via SUBCUTANEOUS
  Administered 2016-01-21 (×2): 3 [IU] via SUBCUTANEOUS

## 2016-01-17 MED ORDER — CARVEDILOL 3.125 MG PO TABS
3.1250 mg | ORAL_TABLET | Freq: Two times a day (BID) | ORAL | Status: DC
  Administered 2016-01-17 – 2016-01-21 (×8): 3.125 mg via ORAL
  Filled 2016-01-17 (×8): qty 1

## 2016-01-17 MED ORDER — LEVOTHYROXINE SODIUM 125 MCG PO TABS: 125.0000 ug | ORAL_TABLET | Freq: Every day | ORAL | Status: DC

## 2016-01-17 MED ORDER — FLUTICASONE PROPIONATE 50 MCG/ACT NA SUSP
2.0000 | Freq: Every day | NASAL | Status: DC
  Filled 2016-01-17: qty 16

## 2016-01-17 MED ORDER — SODIUM CHLORIDE 0.9 % IV SOLN
INTRAVENOUS | Status: AC
  Administered 2016-01-17: 21:00:00 via INTRAVENOUS
  Administered 2016-01-18: 1000 mL via INTRAVENOUS

## 2016-01-17 MED ORDER — LEVETIRACETAM IN NACL 1000 MG/100ML IV SOLN
1000.0000 mg | Freq: Once | INTRAVENOUS | Status: AC
  Administered 2016-01-17: 1000 mg via INTRAVENOUS
  Filled 2016-01-17: qty 100

## 2016-01-17 MED ORDER — LEVETIRACETAM 500 MG/5ML IV SOLN
1000.0000 mg | Freq: Two times a day (BID) | INTRAVENOUS | Status: DC
  Filled 2016-01-17: qty 10

## 2016-01-17 MED ORDER — ENOXAPARIN SODIUM 40 MG/0.4ML ~~LOC~~ SOLN
40.0000 mg | SUBCUTANEOUS | Status: DC
  Administered 2016-01-17 – 2016-01-20 (×4): 40 mg via SUBCUTANEOUS
  Filled 2016-01-17 (×4): qty 0.4

## 2016-01-17 MED ORDER — SODIUM CHLORIDE 0.9 % IV SOLN
INTRAVENOUS | Status: AC
  Administered 2016-01-17: 21:00:00 via INTRAVENOUS

## 2016-01-17 MED ORDER — SIMVASTATIN 20 MG PO TABS
20.0000 mg | ORAL_TABLET | Freq: Every day | ORAL | Status: DC
  Administered 2016-01-17 – 2016-01-20 (×4): 20 mg via ORAL
  Filled 2016-01-17 (×4): qty 1

## 2016-01-17 MED ORDER — PANTOPRAZOLE SODIUM 40 MG PO TBEC
40.0000 mg | DELAYED_RELEASE_TABLET | Freq: Every day | ORAL | Status: DC
  Administered 2016-01-17 – 2016-01-21 (×5): 40 mg via ORAL
  Filled 2016-01-17 (×5): qty 1

## 2016-01-17 MED ORDER — SODIUM CHLORIDE 0.9 % IV BOLUS (SEPSIS)
1000.0000 mL | Freq: Once | INTRAVENOUS | Status: AC
  Administered 2016-01-17: 1000 mL via INTRAVENOUS

## 2016-01-17 MED ORDER — QUINAPRIL HCL 10 MG PO TABS
40.0000 mg | ORAL_TABLET | Freq: Every day | ORAL | Status: DC
  Filled 2016-01-17 (×2): qty 4

## 2016-01-17 MED ORDER — INSULIN GLARGINE 100 UNIT/ML ~~LOC~~ SOLN
20.0000 [IU] | Freq: Every day | SUBCUTANEOUS | Status: DC
  Administered 2016-01-17 – 2016-01-20 (×4): 20 [IU] via SUBCUTANEOUS
  Filled 2016-01-17 (×7): qty 0.2

## 2016-01-17 MED ORDER — INSULIN ASPART 100 UNIT/ML ~~LOC~~ SOLN
10.0000 [IU] | SUBCUTANEOUS | Status: DC
Start: 2016-01-17 — End: 2016-01-18
  Administered 2016-01-17: 10 [IU] via SUBCUTANEOUS

## 2016-01-17 MED ORDER — LORATADINE 10 MG PO TABS
10.0000 mg | ORAL_TABLET | Freq: Every day | ORAL | Status: DC
  Administered 2016-01-17 – 2016-01-21 (×5): 10 mg via ORAL
  Filled 2016-01-17 (×5): qty 1

## 2016-01-17 MED ORDER — ALBUTEROL SULFATE (2.5 MG/3ML) 0.083% IN NEBU: 3.0000 mL | INHALATION_SOLUTION | RESPIRATORY_TRACT | Status: DC | PRN

## 2016-01-17 NOTE — ED Notes (Signed)
Seizure pad in place, friend at the bedside

## 2016-01-17 NOTE — ED Triage Notes (Signed)
Pt here via EMS for evaluation of seizure like activity today. Pr has no history of seizures. Pt did fall Sunday and hit her head. Pt is diabetic and blood sugar per EMS was high. Pt was given Ativan 2 mg by EMS prior to arrival.

## 2016-01-17 NOTE — ED Notes (Signed)
CRITICAL VALUE ALERT  Critical value received:  Glucose 565  Date of notification:  01/17/16  Time of notification:  [id number]  Critical value read back:Yes.    Nurse who received alert:  Rcockram  MD notified : Dr Christy Gentles

## 2016-01-17 NOTE — ED Notes (Signed)
Pt has no children, call best friend with any questions, Jamelle Haring [phone]

## 2016-01-17 NOTE — ED Notes (Signed)
MD at the bedside  

## 2016-01-17 NOTE — ED Notes (Signed)
Pt gone to CT by tech

## 2016-01-17 NOTE — ED Provider Notes (Signed)
[facility] DEPT Provider Note   CSN: [id number] Arrival date & time: 01/17/16  1543  First Provider Contact:  First MD Initiated Contact with Patient 01/17/16 1608      LEVEL 5 CAVEAT DUE TO ALTERED MENTAL STATUS   History   Chief Complaint Chief Complaint  Patient presents with  . Seizures    HPI Jaclyn Herrera is a 70 y.o. female.  The history is provided by the patient and a caregiver ([place]).  Seizures   This is a new problem. Episode onset: EARLIER TODAY. The problem has been gradually improving. There were 2 to 3 seizures. The most recent episode lasted 30 to 120 seconds. Associated symptoms include confusion. The seizure(s) had left-sided and right-sided focality. There has been no fever. Associated symptoms comments: RECENT HEAD INJURY . Medications administered prior to arrival include lorazepam IV.  Patient presents with new onset seizure She had two seizures today, each one last 1-2 minutes Per caregiver, it started on right side of body and then progressed to left side of body It was noted her glucose was high and she was given insulin at home EMS arrived and gave Ativan for seizures Relevant history is she had mechanical fall yesterday and hit her head After the fall she was on the ground for about an hour and she had to "scoot" around No new injuries from fall other than laceration to forehead and hematoma to scalp   Past Medical History:  Diagnosis Date  . 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 Arthritis   . Bronchitis   . CHF (congestive heart failure) ([place])   . Chronic diastolic heart failure ([place]) 07/13/2015  . Diabetes mellitus   . DKA (diabetic ketoacidoses) ([place]) 09/27/2014  . GERD (gastroesophageal reflux disease)   . Headache   . Hyperlipidemia   . Hypertension   . Hypothyroid 10/31/2014  . hypothyroidism   . Pneumonia    hx of   . PONV (postoperative nausea and vomiting)    and slow to wake up after  foot surgery   . Shingles   . Syncope 10/31/2014  . Vertigo 10/31/2014    Patient Active Problem List   Diagnosis Date Noted  . Type 2 diabetes mellitus with left diabetic foot ulcer ([place]) 11/03/2015  . Syncope and collapse 08/13/2015  . Hypoglycemia associated with diabetes ([place]) 08/13/2015  . Hypothermia 08/13/2015  . Fall at home 08/13/2015  . UTI (urinary tract infection) 08/13/2015  . Chronic diastolic heart failure ([place]) 07/13/2015  . Pleural effusion 05/03/2015  . Calcaneal fracture 12/29/2014  . Calcaneus fracture 12/29/2014  . Headache 10/31/2014  . Headache, migraine, intractable 10/31/2014  . Syncope 10/31/2014  . Vertigo 10/31/2014  . Poorly controlled diabetes mellitus ([place]) 10/31/2014  . Essential hypertension 10/31/2014  . HLD (hyperlipidemia) 10/31/2014  . Depression 10/31/2014  . GERD (gastroesophageal reflux disease) 10/31/2014  . Hypothyroid 10/31/2014  . Neck pain   . DKA (diabetic ketoacidoses) ([place]) 09/27/2014  . Nausea and vomiting 09/27/2014  . Diarrhea 09/27/2014  . Hyperkalemia 09/27/2014  . Diabetes mellitus ([place]) 09/27/2014  . Chest pain 04/30/2013  . Dyspnea 04/30/2013  . DIABETES MELLITUS, TYPE II 12/18/2008  . GERD 12/18/2008  . CONSTIPATION, CHRONIC 12/18/2008  . DYSPHAGIA 12/18/2008  . COLONIC POLYPS, HX OF 12/18/2008    Past Surgical History:  Procedure Laterality Date  . ANKLE SURGERY     x 4  . APPENDECTOMY    . CATARACT  EXTRACTION Bilateral   . CHOLECYSTECTOMY    . HARDWARE REMOVAL Left 11/03/2015   Procedure: HARDWARE REMOVAL LEFT FOOT appliction of wound vac;  Surgeon: Gaynelle Arabian, MD;  Location: [facility];  Service: Orthopedics;  Laterality: Left;  . INCISION AND DRAINAGE OF WOUND Left 11/03/2015   Procedure: IRRIGATION AND DEBRIDEMENT LEFT FOOT ULCER;  Surgeon: Gaynelle Arabian, MD;  Location: [facility];  Service: Orthopedics;  Laterality: Left;  . KNEE ARTHROSCOPY    . NASAL SINUS SURGERY    . non cancerous mass removed     small  intestine  . ORIF CALCANEOUS FRACTURE Left 12/29/2014   Procedure: OPEN REDUCTION INTERNAL FIXATION  LEFT CALCANEOUS FRACTURE;  Surgeon: Gaynelle Arabian, MD;  Location: [facility];  Service: Orthopedics;  Laterality: Left;  . TONSILLECTOMY      OB History    No data available       Home Medications    Prior to Admission medications   Medication Sig Start Date End Date Taking? Authorizing Provider  acetaminophen (TYLENOL) 500 MG tablet Take 500-1,000 mg by mouth every 6 (six) hours as needed (For pain.).     Historical Provider, MD  albuterol (PROVENTIL HFA;VENTOLIN HFA) 108 (90 BASE) MCG/ACT inhaler Inhale 2 puffs into the lungs every 4 (four) hours as needed for wheezing or shortness of breath.    Historical Provider, MD  Ascorbic Acid (VITAMIN C PO) Take 1 tablet by mouth daily.    Historical Provider, MD  carisoprodol (SOMA) 350 MG tablet Take 175-350 mg by mouth 3 (three) times daily as needed (For muscle spasms or cramping.).     Historical Provider, MD  esomeprazole (NEXIUM) 20 MG capsule Take 20 mg by mouth daily.     Historical Provider, MD  fexofenadine (ALLEGRA) 180 MG tablet Take 180 mg by mouth daily as needed for allergies or rhinitis.    Historical Provider, MD  fluticasone (FLONASE) 50 MCG/ACT nasal spray Place 2 sprays into the nose 2 (two) times daily as needed for allergies.  10/28/14   Historical Provider, MD  furosemide (LASIX) 20 MG tablet Take 1 tablet (20 mg total) by mouth daily. 09/15/15   Burnell Blanks, MD  Homeopathic Products (ARNICARE EX) Apply 1 application topically daily as needed (For bruising.).    Historical Provider, MD  insulin regular human CONCENTRATED (HUMULIN R) 500 UNIT/ML injection Inject 0.02 mLs (10 Units total) into the skin daily. Sliding scale range is reported 60-150 units based on a sliding scale. She takes it 30 minutes before meals. Patient taking differently: Inject 5-10 Units into the skin 3 (three) times daily with meals.  08/14/15   Asencion Noble, MD  levothyroxine (SYNTHROID, LEVOTHROID) 125 MCG tablet Take 125-187.5 mcg by mouth daily before breakfast. Take one tablet daily except for on Sundays. Take one and a half tablets on that day.    Historical Provider, MD  ondansetron (ZOFRAN) 4 MG tablet Take 1 tablet (4 mg total) by mouth every 6 (six) hours as needed for nausea. 01/01/15   Arlee Muslim, PA-C  oxyCODONE (OXY IR/ROXICODONE) 5 MG immediate release tablet Take 1-2 tablets (5-10 mg total) by mouth every 4 (four) hours as needed for moderate pain or severe pain. 11/05/15   Arlee Muslim, PA-C  PARoxetine (PAXIL) 40 MG tablet Take 40 mg by mouth daily.    Historical Provider, MD  Polyethylene Glycol 400 (BLINK TEARS OP) Place 1 drop into both eyes 4 (four) times daily as needed (Dry eyes).  Historical Provider, MD  potassium chloride (K-DUR) 10 MEQ tablet Take 1 tablet (10 mEq total) by mouth daily. 09/15/15   Burnell Blanks, MD  quinapril (ACCUPRIL) 40 MG tablet Take 40 mg by mouth daily.     Historical Provider, MD  Senna-Psyllium (PERDIEM PO) Take 1 tablet by mouth daily as needed (For constipation.).     Historical Provider, MD  simvastatin (ZOCOR) 20 MG tablet Take 20 mg by mouth at bedtime.     Historical Provider, MD  torsemide (DEMADEX) 20 MG tablet Take 1 tablet (20 mg total) by mouth 2 (two) times daily. Patient taking differently: Take 20 mg by mouth daily.  09/15/15   Burnell Blanks, MD    Family History Family History  Problem Relation Age of Onset  . Diabetes Mother   . Asthma Mother   . Hypertension Father   . Heart disease Father   . Diabetes Father   . Heart disease Sister   . Hypertension Sister   . Asthma Sister   . Diabetes Brother   . CAD Brother   . Stroke Brother     Social History Social History  Substance Use Topics  . Smoking status: Never Smoker  . Smokeless tobacco: Never Used  . Alcohol use No     Allergies   Sulfonamide derivatives; Bactrim  [sulfamethoxazole-trimethoprim]; Erythromycin; Latex; Mucinex [guaifenesin er]; Aspirin; Levofloxacin; and Tape   Review of Systems Review of Systems  Unable to perform ROS: Mental status change  Constitutional: Negative for fever.  Neurological: Positive for seizures.  Psychiatric/Behavioral: Positive for confusion.     Physical Exam Updated Vital Signs BP (!) 147/51 (BP Location: Right Arm)   Pulse 91   Temp 98.2 F (36.8 C)   Resp 16   Ht 5\' 8"  (1.727 m)   Wt 81.6 kg   SpO2 100%   BMI 27.37 kg/m   Physical Exam CONSTITUTIONAL: elderly, frail HEAD: healing wound to forehead.  She has hematoma to posterior scalp. EYES: EOMI/PERRL ENMT: Mucous membranes dry NECK: supple no meningeal signs SPINE/BACK:entire spine nontender CV: S1/S2 noted, no murmurs/rubs/gallops noted LUNGS: Lungs are clear to auscultation bilaterally, no apparent distress ABDOMEN: soft, nontender NEURO: Pt is somnolent but arousable and answers most questions appropriately but then drifts to sleep.  No facial droop.  No arm drift.  She has difficulty with raising right LE which is new for her.  She can fully raise left LE EXTREMITIES: pulses normal/equal, full ROM, no deformities noted.  No focal tenderness.  Pelvis stable SKIN: warm, color normal, mild erythema to sacral region PSYCH: no abnormalities of mood noted, alert and oriented to situation   ED Treatments / Results  Labs (all labs ordered are listed, but only abnormal results are displayed) Labs Reviewed  CBC - Abnormal; Notable for the following:       Result Value   RBC 3.58 (*)    Hemoglobin 10.4 (*)    HCT 29.5 (*)    Platelets 149 (*)    All other components within normal limits  DIFFERENTIAL - Abnormal; Notable for the following:    Lymphs Abs 0.4 (*)    All other components within normal limits  COMPREHENSIVE METABOLIC PANEL - Abnormal; Notable for the following:    Sodium 130 (*)    Potassium 5.3 (*)    CO2 19 (*)     Glucose, Bld 565 (*)    BUN 39 (*)    Creatinine, Ser 1.24 (*)    Calcium  8.1 (*)    Total Protein 6.1 (*)    ALT 12 (*)    Alkaline Phosphatase 169 (*)    GFR calc non Af Amer 43 (*)    GFR calc Af Amer 49 (*)    All other components within normal limits  URINALYSIS, ROUTINE W REFLEX MICROSCOPIC (NOT AT [facility]) - Abnormal; Notable for the following:    Glucose, UA >1000 (*)    Hgb urine dipstick TRACE (*)    Ketones, ur 15 (*)    All other components within normal limits  CK - Abnormal; Notable for the following:    Total CK 24 (*)    All other components within normal limits  URINE MICROSCOPIC-ADD ON - Abnormal; Notable for the following:    Squamous Epithelial / LPF 0-5 (*)    Bacteria, UA RARE (*)    All other components within normal limits  CBG MONITORING, ED - Abnormal; Notable for the following:    Glucose-Capillary 497 (*)    All other components within normal limits  CBG MONITORING, ED - Abnormal; Notable for the following:    Glucose-Capillary 393 (*)    All other components within normal limits  I-STAT CHEM 8, ED - Abnormal; Notable for the following:    Sodium 134 (*)    Potassium 5.5 (*)    BUN 36 (*)    Glucose, Bld 558 (*)    Hemoglobin 9.9 (*)    HCT 29.0 (*)    All other components within normal limits  ETHANOL  PROTIME-INR  APTT  URINE RAPID DRUG SCREEN, HOSP PERFORMED  TROPONIN I  CBG MONITORING, ED    EKG  EKG Interpretation  Date/Time:  Monday January 17 2016 15:57:31 EDT Ventricular Rate:  89 PR Interval:    QRS Duration: 82 QT Interval:  387 QTC Calculation: 471 R Axis:   6 Text Interpretation:  Sinus rhythm ?hyperacute T waves Abnormal ekg Confirmed by Christy Gentles  MD, Joyclyn Plazola ([id number]) on 01/17/2016 4:20:01 PM       Radiology Ct Head Wo Contrast  Result Date: 01/17/2016 CLINICAL DATA:  Via EMS for evaluation seizure activity. No history of seizures. Fell on Sunday and hit her head. Knot on the forehead and left posterior. EXAM: CT HEAD  WITHOUT CONTRAST TECHNIQUE: Contiguous axial images were obtained from the base of the skull through the vertex without intravenous contrast. COMPARISON:  08/13/2015 FINDINGS: There is central and cortical atrophy. Periventricular white matter changes are consistent with small vessel disease. There is no intra or extra-axial fluid collection or mass lesion. The basilar cisterns and ventricles have a normal appearance. There is no CT evidence for acute infarction or hemorrhage. Large left parietal scalp hematoma is present. No underlying calvarial fracture. Paranasal sinuses and mastoid air cells are normally aerated. IMPRESSION: 1. Atrophy and small vessel disease. 2.  No evidence for acute intracranial abnormality. 3. Large left posterior parietal scalp hematoma. Electronically Signed   By: Nolon Nations M.D.   On: 01/17/2016 18:26   Dg Chest Port 1 View  Result Date: 01/17/2016 CLINICAL DATA:  70 year old female status post fall, seizure yesterday. Initial encounter. EXAM: PORTABLE CHEST 1 VIEW COMPARISON:  08/13/2015 and earlier. FINDINGS: Portable AP semi upright view at 1624 hours. Stable lung volumes. Improved bibasilar ventilation. Pulmonary vascular congestion without overt edema. Normal cardiac size and mediastinal contours. Visualized tracheal air column is within normal limits. No pneumothorax or pleural effusion. IMPRESSION: No acute cardiopulmonary abnormality. Electronically Signed   By: Lemmie Evens  [place] Crane M.D.   On: 01/17/2016 16:45    Procedures Procedures (including critical care time)  Medications Ordered in ED Medications  levETIRAcetam (KEPPRA) IVPB 1000 mg/100 mL premix (not administered)  sodium chloride 0.9 % bolus 1,000 mL (0 mLs Intravenous Stopped 01/17/16 1730)  insulin aspart (novoLOG) injection 10 Units (10 Units Subcutaneous Given 01/17/16 1807)     Initial Impression / Assessment and Plan / ED Course  I have reviewed the triage vital signs and the nursing notes.  Pertinent labs  & imaging results that were available during my care of the patient were reviewed by me and considered in my medical decision making (see chart for details).  Clinical Course    Pt with new onset seizure She has already been given ativan Will follow closely 7:37 PM Pt with negative CT head She is hyperglycemic, mildly hyperKalemic, and she is dehydrated She is awake/alert, GCS 15 Her right LE weakness is improved from prior She is hemodynamically stable Mild HyperK should improve with IV fluids/insulin I have spoken to dr Nicole Kindred with neuro - recommends IV keppra 1gm and can manage at [facility] D/w dr Marthenia Rolling for admission to [facility] Pt is a dr fagan patient    Final Clinical Impressions(s) / ED Diagnoses   Final diagnoses:  Seizure ([place])  Hyperglycemia  Dehydration  Hyperkalemia    New Prescriptions New Prescriptions   No medications on file     Ripley Fraise, MD 01/17/16 1939

## 2016-01-17 NOTE — H&P (Signed)
History and Physical  Jaclyn Herrera [id number] DOB: February 07, 1946 DOA: 01/17/2016  Referring physician: ER Physician PCP: Asencion Noble, MD  Outpatient Specialists:    Patient coming from: Home  Chief Complaint: Seizure  HPI: 70 year old with history of DM, HTN, HL, diastolic CHF and anemia. Patient is not able to give any coherent history. Collateral information reveals that patient had 3 seizures earlier today. No prior history of seizures. No fever or chills, no SOB, no Chest pain, no GI symptoms or urinary symptoms. Not clear if there is associated headache. CT Head revealed no evidence for acute intracranial abnormality, but revealed large left posterior parietal scalp hematoma (following seizure). Blood sugar is greater than 500, potassium is 5.5 and no anion gap.  ER physician discussed with the Neurologist on call and it was advised that the MRI of the brain can wait till tomorrow.  ED Course: Hydration. Insulin. IV Keppra.  Pertinent labs: As above. EKG: Independently reviewed.  Imaging: independently reviewed.  Review of Systems: As in HPI. Negative for fever, visual changes, sore throat, rash, new muscle aches, chest pain, SOB, dysuria, bleeding, n/v/abdominal pain.  Past Medical History:  Diagnosis Date  . 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 Arthritis   . Bronchitis   . CHF (congestive heart failure) ([place])   . Chronic diastolic heart failure ([place]) 07/13/2015  . Diabetes mellitus   . DKA (diabetic ketoacidoses) ([place]) 09/27/2014  . GERD (gastroesophageal reflux disease)   . Headache   . Hyperlipidemia   . Hypertension   . Hypothyroid 10/31/2014  . hypothyroidism   . Pneumonia    hx of   . PONV (postoperative nausea and vomiting)    and slow to wake up after foot surgery   . Shingles   . Syncope 10/31/2014  . Vertigo 10/31/2014    Past Surgical History:  Procedure Laterality Date  . ANKLE SURGERY     x 4  . APPENDECTOMY    . CATARACT  EXTRACTION Bilateral   . CHOLECYSTECTOMY    . HARDWARE REMOVAL Left 11/03/2015   Procedure: HARDWARE REMOVAL LEFT FOOT appliction of wound vac;  Surgeon: Gaynelle Arabian, MD;  Location: [facility];  Service: Orthopedics;  Laterality: Left;  . INCISION AND DRAINAGE OF WOUND Left 11/03/2015   Procedure: IRRIGATION AND DEBRIDEMENT LEFT FOOT ULCER;  Surgeon: Gaynelle Arabian, MD;  Location: [facility];  Service: Orthopedics;  Laterality: Left;  . KNEE ARTHROSCOPY    . NASAL SINUS SURGERY    . non cancerous mass removed     small intestine  . ORIF CALCANEOUS FRACTURE Left 12/29/2014   Procedure: OPEN REDUCTION INTERNAL FIXATION  LEFT CALCANEOUS FRACTURE;  Surgeon: Gaynelle Arabian, MD;  Location: [facility];  Service: Orthopedics;  Laterality: Left;  . TONSILLECTOMY       reports that she has never smoked. She has never used smokeless tobacco. She reports that she does not drink alcohol or use drugs.  Allergies  Allergen Reactions  . Sulfonamide Derivatives Anaphylaxis  . Bactrim [Sulfamethoxazole-Trimethoprim] Nausea Only  . Erythromycin Nausea And Vomiting  . Latex Itching and Other (See Comments)    If she wears gloves too long her hands start itching  . Mucinex [Guaifenesin Er] Nausea And Vomiting  . Aspirin Rash  . Levofloxacin Rash  . Tape Rash    Family History  Problem Relation Age of Onset  . Diabetes Mother   . Asthma Mother   . Hypertension Father   .  Heart disease Father   . Diabetes Father   . Heart disease Sister   . Hypertension Sister   . Asthma Sister   . Diabetes Brother   . CAD Brother   . Stroke Brother      Prior to Admission medications   Medication Sig Start Date End Date Taking? Authorizing Provider  acetaminophen (TYLENOL) 500 MG tablet Take 500-1,000 mg by mouth every 6 (six) hours as needed (For pain.).    Yes Historical Provider, MD  albuterol (PROVENTIL HFA;VENTOLIN HFA) 108 (90 BASE) MCG/ACT inhaler Inhale 2 puffs into the lungs every 4 (four) hours as needed for  wheezing or shortness of breath.   Yes Historical Provider, MD  carvedilol (COREG) 3.125 MG tablet Take 3.125 mg by mouth 2 (two) times daily with a meal.  12/23/15  Yes Historical Provider, MD  esomeprazole (NEXIUM) 20 MG capsule Take 20 mg by mouth daily.    Yes Historical Provider, MD  fexofenadine (ALLEGRA) 180 MG tablet Take 180 mg by mouth daily as needed for allergies or rhinitis.   Yes Historical Provider, MD  fluticasone (FLONASE) 50 MCG/ACT nasal spray Place 2 sprays into the nose 2 (two) times daily as needed for allergies.  10/28/14  Yes Historical Provider, MD  insulin regular human CONCENTRATED (HUMULIN R) 500 UNIT/ML injection Inject 0.02 mLs (10 Units total) into the skin daily. Sliding scale range is reported 60-150 units based on a sliding scale. She takes it 30 minutes before meals. Patient taking differently: Inject 5-10 Units into the skin 3 (three) times daily with meals.  08/14/15  Yes Asencion Noble, MD  levothyroxine (SYNTHROID, LEVOTHROID) 125 MCG tablet Take 125-187.5 mcg by mouth daily before breakfast. Take one tablet daily except for on Sundays. Take one and a half tablets on that day.   Yes Historical Provider, MD  ondansetron (ZOFRAN) 4 MG tablet Take 1 tablet (4 mg total) by mouth every 6 (six) hours as needed for nausea. 01/01/15  Yes Arlee Muslim, PA-C  PARoxetine (PAXIL) 40 MG tablet Take 40 mg by mouth daily.   Yes Historical Provider, MD  Polyethylene Glycol 400 (BLINK TEARS OP) Place 1 drop into both eyes 4 (four) times daily as needed (Dry eyes).    Yes Historical Provider, MD  potassium chloride (K-DUR) 10 MEQ tablet Take 1 tablet (10 mEq total) by mouth daily. 09/15/15  Yes Burnell Blanks, MD  quinapril (ACCUPRIL) 40 MG tablet Take 40 mg by mouth daily.    Yes Historical Provider, MD  Senna-Psyllium (PERDIEM PO) Take 1 tablet by mouth daily as needed (For constipation.).    Yes Historical Provider, MD  simvastatin (ZOCOR) 20 MG tablet Take 20 mg by mouth at  bedtime.    Yes Historical Provider, MD  torsemide (DEMADEX) 20 MG tablet Take 1 tablet (20 mg total) by mouth 2 (two) times daily. Patient taking differently: Take 20 mg by mouth daily.  09/15/15  Yes Burnell Blanks, MD    Physical Exam: Vitals:   01/17/16 1730 01/17/16 1800 01/17/16 1830 01/17/16 1839  BP: (!) 144/49 (!) 136/51 (!) 133/53 (!) 133/53  Pulse:    72  Resp:  19 17 18   Temp:      SpO2:    100%  Weight:      Height:       Constitutional:  . Appears calm and comfortable. Frontal hematoma. Eyes:  . No pallor. No jaundice.  ENMT:  . external ears, nose appear normal. Dry buccal mucosa  Neck:  . Neck is supple. No JVD Respiratory:  . CTA bilaterally, no w/r/r.  . Respiratory effort normal. No retractions or accessory muscle use Cardiovascular:  . S1S2 . No LE extremity edema   Abdomen:  . Abdomen is soft and non tender. Organs are difficult to assess. Neurologic:  . Awake and alert. . Moves all limbs.  Wt Readings from Last 3 Encounters:  01/17/16 81.6 kg (180 lb)  11/03/15 86.2 kg (190 lb)  11/01/15 86.2 kg (190 lb)    I have personally reviewed following labs and imaging studies  Labs on Admission:  CBC:  Recent Labs Lab 01/17/16 1628 01/17/16 1643  WBC 5.7  --   NEUTROABS 4.9  --   HGB 10.4* 9.9*  HCT 29.5* 29.0*  MCV 82.4  --   PLT 149*  --    Basic Metabolic Panel:  Recent Labs Lab 01/17/16 1628 01/17/16 1643  NA 130* 134*  K 5.3* 5.5*  CL 102 102  CO2 19*  --   GLUCOSE 565* 558*  BUN 39* 36*  CREATININE 1.24* 1.00  CALCIUM 8.1*  --    Liver Function Tests:  Recent Labs Lab 01/17/16 1628  AST 16  ALT 12*  ALKPHOS 169*  BILITOT 0.9  PROT 6.1*  ALBUMIN 3.6   No results for input(s): LIPASE, AMYLASE in the last 168 hours. No results for input(s): AMMONIA in the last 168 hours. Coagulation Profile:  Recent Labs Lab 01/17/16 1628  INR 0.98   Cardiac Enzymes:  Recent Labs Lab 01/17/16 1628  CKTOTAL 24*    TROPONINI <0.03   BNP (last 3 results)  Recent Labs  05/03/15 1501 07/06/15 1217  PROBNP 199.0* 638.0*   HbA1C: No results for input(s): HGBA1C in the last 72 hours. CBG:  Recent Labs Lab 01/17/16 1609 01/17/16 1838  GLUCAP 497* 393*   Lipid Profile: No results for input(s): CHOL, HDL, LDLCALC, TRIG, CHOLHDL, LDLDIRECT in the last 72 hours. Thyroid Function Tests: No results for input(s): TSH, T4TOTAL, FREET4, T3FREE, THYROIDAB in the last 72 hours. Anemia Panel: No results for input(s): VITAMINB12, FOLATE, FERRITIN, TIBC, IRON, RETICCTPCT in the last 72 hours. Urine analysis:    Component Value Date/Time   COLORURINE YELLOW 01/17/2016 1657   APPEARANCEUR CLEAR 01/17/2016 1657   LABSPEC 1.020 01/17/2016 1657   PHURINE 5.5 01/17/2016 1657   GLUCOSEU >1000 (A) 01/17/2016 1657   HGBUR TRACE (A) 01/17/2016 1657   BILIRUBINUR NEGATIVE 01/17/2016 1657   KETONESUR 15 (A) 01/17/2016 1657   PROTEINUR NEGATIVE 01/17/2016 1657   UROBILINOGEN 0.2 12/29/2014 1717   NITRITE NEGATIVE 01/17/2016 1657   LEUKOCYTESUR NEGATIVE 01/17/2016 1657   Sepsis Labs: @LABRCNTIP (procalcitonin:4,lacticidven:4) )No results found for this or any previous visit (from the past 240 hour(s)).    Radiological Exams on Admission: Ct Head Wo Contrast  Result Date: 01/17/2016 CLINICAL DATA:  Via EMS for evaluation seizure activity. No history of seizures. Fell on Sunday and hit her head. Knot on the forehead and left posterior. EXAM: CT HEAD WITHOUT CONTRAST TECHNIQUE: Contiguous axial images were obtained from the base of the skull through the vertex without intravenous contrast. COMPARISON:  08/13/2015 FINDINGS: There is central and cortical atrophy. Periventricular white matter changes are consistent with small vessel disease. There is no intra or extra-axial fluid collection or mass lesion. The basilar cisterns and ventricles have a normal appearance. There is no CT evidence for acute infarction or  hemorrhage. Large left parietal scalp hematoma is present. No underlying calvarial fracture.  Paranasal sinuses and mastoid air cells are normally aerated. IMPRESSION: 1. Atrophy and small vessel disease. 2.  No evidence for acute intracranial abnormality. 3. Large left posterior parietal scalp hematoma. Electronically Signed   By: Nolon Nations M.D.   On: 01/17/2016 18:26   Dg Chest Port 1 View  Result Date: 01/17/2016 CLINICAL DATA:  70 year old female status post fall, seizure yesterday. Initial encounter. EXAM: PORTABLE CHEST 1 VIEW COMPARISON:  08/13/2015 and earlier. FINDINGS: Portable AP semi upright view at 1624 hours. Stable lung volumes. Improved bibasilar ventilation. Pulmonary vascular congestion without overt edema. Normal cardiac size and mediastinal contours. Visualized tracheal air column is within normal limits. No pneumothorax or pleural effusion. IMPRESSION: No acute cardiopulmonary abnormality. Electronically Signed   By: Genevie Ann M.D.   On: 01/17/2016 16:45    EKG: Independently reviewed.  Active Problems:   Seizure ([place])   Assessment/Plan 1. Seizure 2. Uncontrolled DM 3. Volume depletion 4. Hyperkalemia (but may be related to elevated blood sugar)   Admit patient to ICU  Hydrate patient (Review IVF after MRI brain please)  Seizure precaution  IV anti-seizure (Keppra) Medication as per Neurology  EEG  Please consult Neurology in am  Monitor and control blood sugar and have low threshold to change to IV insulin drip  DVT prophylaxis: [place] Lovenox Code Status: Full Family Communication: None available Disposition Plan: Undetermined   Consults called: Consult Neurology in am   Admission status: Inpatient    Time spent: Greater than 60 minutes  Dana Allan, MD  [facility] Pager #: [phone] 7PM-7AM contact night coverage as above   01/17/2016, 8:13 PM

## 2016-01-17 NOTE — ED Notes (Signed)
Friend at bedside, pt sleeping, states pt fell yesterday, knot and bruising on forehead and knot to the left side of head.

## 2016-01-18 ENCOUNTER — Inpatient Hospital Stay (HOSPITAL_COMMUNITY): Payer: Medicare Other

## 2016-01-18 ENCOUNTER — Inpatient Hospital Stay (HOSPITAL_COMMUNITY)
Admit: 2016-01-18 | Discharge: 2016-01-18 | Disposition: A | Discharge status: Home / Self Care | Payer: Medicare Other | Attending: Internal Medicine | Admitting: Internal Medicine

## 2016-01-18 DIAGNOSIS — R569 Unspecified convulsions: Principal | ICD-10-CM

## 2016-01-18 DIAGNOSIS — E1100 Type 2 diabetes mellitus with hyperosmolarity without nonketotic hyperglycemic-hyperosmolar coma (NKHHC): Secondary | ICD-10-CM

## 2016-01-18 LAB — GLUCOSE, CAPILLARY
Glucose-Capillary: 123 mg/dL — ABNORMAL HIGH (ref 65–99)
Glucose-Capillary: 146 mg/dL — ABNORMAL HIGH (ref 65–99)
Glucose-Capillary: 308 mg/dL — ABNORMAL HIGH (ref 65–99)
Glucose-Capillary: 311 mg/dL — ABNORMAL HIGH (ref 65–99)
Glucose-Capillary: 71 mg/dL (ref 65–99)
Glucose-Capillary: 75 mg/dL (ref 65–99)

## 2016-01-18 LAB — COMPREHENSIVE METABOLIC PANEL
ALT: 9 U/L — ABNORMAL LOW (ref 14–54)
AST: 12 U/L — ABNORMAL LOW (ref 15–41)
Albumin: 3.2 g/dL — ABNORMAL LOW (ref 3.5–5.0)
Alkaline Phosphatase: 122 U/L (ref 38–126)
Anion gap: 7 (ref 5–15)
BUN: 35 mg/dL — ABNORMAL HIGH (ref 6–20)
CO2: 24 mmol/L (ref 22–32)
Calcium: 8.5 mg/dL — ABNORMAL LOW (ref 8.9–10.3)
Chloride: 112 mmol/L — ABNORMAL HIGH (ref 101–111)
Creatinine, Ser: 0.94 mg/dL (ref 0.44–1.00)
GFR calc Af Amer: >60 mL/min (ref >60)
GFR calc non Af Amer: 60 mL/min — ABNORMAL LOW (ref >60)
Glucose, Bld: 48 mg/dL — ABNORMAL LOW (ref 65–99)
Potassium: 4.7 mmol/L (ref 3.5–5.1)
Sodium: 143 mmol/L (ref 135–145)
Total Bilirubin: 0.3 mg/dL (ref 0.3–1.2)
Total Protein: 5.4 g/dL — ABNORMAL LOW (ref 6.5–8.1)

## 2016-01-18 LAB — MRSA PCR SCREENING: MRSA by PCR: POSITIVE — AB

## 2016-01-18 LAB — CBC
HCT: 28.2 % — ABNORMAL LOW (ref 36.0–46.0)
Hemoglobin: 9.7 g/dL — ABNORMAL LOW (ref 12.0–15.0)
MCH: 28.4 pg (ref 26.0–34.0)
MCHC: 34.4 g/dL (ref 30.0–36.0)
MCV: 82.5 fL (ref 78.0–100.0)
Platelets: 148 10*3/uL — ABNORMAL LOW (ref 150–400)
RBC: 3.42 MIL/uL — ABNORMAL LOW (ref 3.87–5.11)
RDW: 14.5 % (ref 11.5–15.5)
WBC: 5.9 10*3/uL (ref 4.0–10.5)

## 2016-01-18 MED ORDER — GADOBENATE DIMEGLUMINE 529 MG/ML IV SOLN
18.0000 mL | Freq: Once | INTRAVENOUS | Status: AC | PRN
  Administered 2016-01-18: 18 mL via INTRAVENOUS

## 2016-01-18 MED ORDER — INSULIN ASPART 100 UNIT/ML ~~LOC~~ SOLN
10.0000 [IU] | Freq: Three times a day (TID) | SUBCUTANEOUS | Status: DC
  Administered 2016-01-18 – 2016-01-21 (×8): 10 [IU] via SUBCUTANEOUS

## 2016-01-18 MED ORDER — CHLORHEXIDINE GLUCONATE CLOTH 2 % EX PADS
6.0000 | MEDICATED_PAD | Freq: Every day | CUTANEOUS | Status: DC
  Administered 2016-01-19 – 2016-01-21 (×2): 6 via TOPICAL

## 2016-01-18 MED ORDER — MUPIROCIN 2 % EX OINT
1.0000 "application " | TOPICAL_OINTMENT | Freq: Two times a day (BID) | CUTANEOUS | Status: DC
  Administered 2016-01-18 – 2016-01-20 (×6): 1 via NASAL
  Filled 2016-01-18: qty 22

## 2016-01-18 MED ORDER — LISINOPRIL 10 MG PO TABS
40.0000 mg | ORAL_TABLET | Freq: Every day | ORAL | Status: DC
  Administered 2016-01-18 – 2016-01-21 (×4): 40 mg via ORAL
  Filled 2016-01-18 (×4): qty 4

## 2016-01-18 MED ORDER — LEVETIRACETAM IN NACL 1000 MG/100ML IV SOLN
1000.0000 mg | Freq: Two times a day (BID) | INTRAVENOUS | Status: DC
  Administered 2016-01-18 – 2016-01-19 (×4): 1000 mg via INTRAVENOUS
  Filled 2016-01-18 (×5): qty 100

## 2016-01-18 NOTE — Progress Notes (Signed)
EEG Completed; Results Pending  

## 2016-01-18 NOTE — Progress Notes (Addendum)
Inpatient Diabetes Program Recommendations  AACE/ADA: New Consensus Statement on Inpatient Glycemic Control (2015)  Target Ranges:  Prepandial:   less than 140 mg/dL      Peak postprandial:   less than 180 mg/dL (1-2 hours)      Critically ill patients:  140 - 180 mg/dL  Results for CLARYCE, STAREK (MRN [id number]) as of 01/18/2016 10:23  Ref. Range 01/17/2016 16:09 01/17/2016 18:38 01/17/2016 21:47 01/18/2016 00:08 01/18/2016 05:36 01/18/2016 07:42  Glucose-Capillary Latest Ref Range: 65 - 99 mg/dL 497 (H) 393 (H) 99 71 75 123 (H)    Review of Glycemic Control  Diabetes history: DM2 Outpatient Diabetes medications: Humulin R U500 25-50 units TID with meals (patient uses U-100 insulin syringe to draw up from vial so actual dosages are 25-50 units TID with meals) Current orders for Inpatient glycemic control: Lantus 20 units QHS, Novolog 0-15 units Q4H, Novolog 10 units Q4H   Inpatient Diabetes Program Recommendations: Insulin - Meal Coverage: Note patient is ordered Novolog 10 units Q4H for tube feeding coverage. However, patient is not ordered any tube feeding. If patient is eating at least 50% of meals, may want to order Novolog 10 units TID with meals for meal coverage.  Addendum 01/18/16@14 :58- Went by to talk with patient about glycemic control and outpatient DM regimen but patient is currently having an EEG done at bedside and can not be stimulated at this time. Talked with Mysti, RN and she reports that patient is eating well and that the Novolog 10 units Q4H was discontinued earlier today. Will text page Dr. Marin Comment to ask for Novolog 10 units TID with meals for meal coverage. Patient likely will need basal insulin increased but will follow glucose trends at this time.  Thanks, Barnie Alderman, RN, MSN, CDE Diabetes Coordinator Inpatient Diabetes Program [phone] (Team Pager from [place] to [place]) [phone] ([facility] office) [phone] [facility] office) [phone] [facility] office)

## 2016-01-18 NOTE — Progress Notes (Signed)
Neuro consult ordered. Pt has refused to be seen by on-call neurologist, so MD was not added to tx team. MD notified. No new orders.

## 2016-01-18 NOTE — Progress Notes (Signed)
[facility] PROGRESS NOTE  Jaclyn Herrera [id number] DOB: [birth year]    PCP:   Asencion Noble, MD   HPI: Jaclyn Herrera is an 70 y.o. female with hx CHF, DM, GERD, lives at home, still driving, admitted for several episodes of seizures in the setting of hyperosmolar.  Interestingly, she was aware of the coming seizures, describing progressive from her arm and legs and became generalized.  Work up is on the way with neurology consultation pending.   Rewiew of Systems:  Constitutional: Negative for malaise, fever and chills. No significant weight loss or weight gain Eyes: Negative for eye pain, redness and discharge, diplopia, visual changes, or flashes of light. ENMT: Negative for ear pain, hoarseness, nasal congestion, sinus pressure and sore throat. No headaches; tinnitus, drooling, or problem swallowing. Cardiovascular: Negative for chest pain, palpitations, diaphoresis, dyspnea and peripheral edema. ; No orthopnea, PND Respiratory: Negative for cough, hemoptysis, wheezing and stridor. No pleuritic chestpain. Gastrointestinal: Negative for nausea, vomiting, diarrhea, constipation, abdominal pain, melena, blood in stool, hematemesis, jaundice and rectal bleeding.    Genitourinary: Negative for frequency, dysuria, incontinence,flank pain and hematuria; Musculoskeletal: Negative for back pain and neck pain. Negative for swelling and trauma.;  Skin: . Negative for pruritus, rash, abrasions, bruising and skin lesion.; ulcerations Neuro: Negative for headache, lightheadedness and neck stiffness. Negative for weakness, altered level of consciousness , altered mental status, extremity weakness, burning feet, involuntary movement, seizure and syncope.  Psych: negative for anxiety, depression, insomnia, tearfulness, panic attacks, hallucinations, paranoia, suicidal or homicidal ideation    Past Medical History:  Diagnosis Date  . 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  Arthritis   . Bronchitis   . CHF (congestive heart failure) ([place])   . Chronic diastolic heart failure ([place]) 07/13/2015  . Diabetes mellitus   . DKA (diabetic ketoacidoses) ([place]) 09/27/2014  . GERD (gastroesophageal reflux disease)   . Headache   . Hyperlipidemia   . Hypertension   . Hypothyroid 10/31/2014  . hypothyroidism   . Pneumonia    hx of   . PONV (postoperative nausea and vomiting)    and slow to wake up after foot surgery   . Shingles   . Syncope 10/31/2014  . Vertigo 10/31/2014    Past Surgical History:  Procedure Laterality Date  . ANKLE SURGERY     x 4  . APPENDECTOMY    . CATARACT EXTRACTION Bilateral   . CHOLECYSTECTOMY    . HARDWARE REMOVAL Left 11/03/2015   Procedure: HARDWARE REMOVAL LEFT FOOT appliction of wound vac;  Surgeon: Gaynelle Arabian, MD;  Location: [facility];  Service: Orthopedics;  Laterality: Left;  . INCISION AND DRAINAGE OF WOUND Left 11/03/2015   Procedure: IRRIGATION AND DEBRIDEMENT LEFT FOOT ULCER;  Surgeon: Gaynelle Arabian, MD;  Location: [facility];  Service: Orthopedics;  Laterality: Left;  . KNEE ARTHROSCOPY    . NASAL SINUS SURGERY    . non cancerous mass removed     small intestine  . ORIF CALCANEOUS FRACTURE Left 12/29/2014   Procedure: OPEN REDUCTION INTERNAL FIXATION  LEFT CALCANEOUS FRACTURE;  Surgeon: Gaynelle Arabian, MD;  Location: [facility];  Service: Orthopedics;  Laterality: Left;  . TONSILLECTOMY      Medications:  HOME MEDS: Prior to Admission medications   Medication Sig Start Date End Date Taking? Authorizing Provider  acetaminophen (TYLENOL) 500 MG tablet Take 500-1,000 mg by mouth every 6 (six) hours as needed (For pain.).    Yes Historical Provider,  MD  albuterol (PROVENTIL HFA;VENTOLIN HFA) 108 (90 BASE) MCG/ACT inhaler Inhale 2 puffs into the lungs every 4 (four) hours as needed for wheezing or shortness of breath.   Yes Historical Provider, MD  carvedilol (COREG) 3.125 MG tablet Take 3.125 mg by mouth 2 (two) times daily with a meal.   12/23/15  Yes Historical Provider, MD  esomeprazole (NEXIUM) 20 MG capsule Take 20 mg by mouth daily.    Yes Historical Provider, MD  fexofenadine (ALLEGRA) 180 MG tablet Take 180 mg by mouth daily as needed for allergies or rhinitis.   Yes Historical Provider, MD  fluticasone (FLONASE) 50 MCG/ACT nasal spray Place 2 sprays into the nose 2 (two) times daily as needed for allergies.  10/28/14  Yes Historical Provider, MD  insulin regular human CONCENTRATED (HUMULIN R) 500 UNIT/ML injection Inject 0.02 mLs (10 Units total) into the skin daily. Sliding scale range is reported 60-150 units based on a sliding scale. She takes it 30 minutes before meals. Patient taking differently: Inject 5-10 Units into the skin 3 (three) times daily with meals.  08/14/15  Yes Asencion Noble, MD  levothyroxine (SYNTHROID, LEVOTHROID) 125 MCG tablet Take 125-187.5 mcg by mouth daily before breakfast. Take one tablet daily except for on Sundays. Take one and a half tablets on that day.   Yes Historical Provider, MD  ondansetron (ZOFRAN) 4 MG tablet Take 1 tablet (4 mg total) by mouth every 6 (six) hours as needed for nausea. 01/01/15  Yes Arlee Muslim, PA-C  PARoxetine (PAXIL) 40 MG tablet Take 40 mg by mouth daily.   Yes Historical Provider, MD  Polyethylene Glycol 400 (BLINK TEARS OP) Place 1 drop into both eyes 4 (four) times daily as needed (Dry eyes).    Yes Historical Provider, MD  potassium chloride (K-DUR) 10 MEQ tablet Take 1 tablet (10 mEq total) by mouth daily. 09/15/15  Yes Burnell Blanks, MD  quinapril (ACCUPRIL) 40 MG tablet Take 40 mg by mouth daily.    Yes Historical Provider, MD  Senna-Psyllium (PERDIEM PO) Take 1 tablet by mouth daily as needed (For constipation.).    Yes Historical Provider, MD  simvastatin (ZOCOR) 20 MG tablet Take 20 mg by mouth at bedtime.    Yes Historical Provider, MD  torsemide (DEMADEX) 20 MG tablet Take 1 tablet (20 mg total) by mouth 2 (two) times daily. Patient taking differently:  Take 20 mg by mouth daily.  09/15/15  Yes Burnell Blanks, MD     Allergies:  Allergies  Allergen Reactions  . Sulfonamide Derivatives Anaphylaxis  . Bactrim [Sulfamethoxazole-Trimethoprim] Nausea Only  . Erythromycin Nausea And Vomiting  . Latex Itching and Other (See Comments)    If she wears gloves too long her hands start itching  . Mucinex [Guaifenesin Er] Nausea And Vomiting  . Aspirin Rash  . Levofloxacin Rash  . Tape Rash    Social History:   reports that she has never smoked. She has never used smokeless tobacco. She reports that she does not drink alcohol or use drugs.  Family History: Family History  Problem Relation Age of Onset  . Diabetes Mother   . Asthma Mother   . Hypertension Father   . Heart disease Father   . Diabetes Father   . Heart disease Sister   . Hypertension Sister   . Asthma Sister   . Diabetes Brother   . CAD Brother   . Stroke Brother      Physical Exam: Vitals:   01/18/16  0400 01/18/16 0500 01/18/16 0600 01/18/16 0749  BP: (!) 152/60 (!) 147/56 (!) 164/60   Pulse: (!) 58 (!) 58 61   Resp: (!) 0 (!) 0 15   Temp: 97.6 F (36.4 C)   97.6 F (36.4 C)  TempSrc: Oral   Oral  SpO2: 99% 99% 99%   Weight:  89.5 kg (197 lb 5 oz)    Height:       Blood pressure (!) 164/60, pulse 61, temperature 97.6 F (36.4 C), temperature source Oral, resp. rate 15, height 5\' 8"  (1.727 m), weight 89.5 kg (197 lb 5 oz), SpO2 99 %.  GEN:  Pleasant patient lying in the stretcher in no acute distress; cooperative with exam. PSYCH:  alert and oriented x4; does not appear anxious or depressed; affect is appropriate. HEENT: Mucous membranes pink and anicteric; PERRLA; EOM intact; no cervical lymphadenopathy nor thyromegaly or carotid bruit; no JVD; There were no stridor. Neck is very supple. Breasts:: Not examined CHEST WALL: No tenderness CHEST: Normal respiration, clear to auscultation bilaterally.  HEART: Regular rate and rhythm.  There are no  murmur, rub, or gallops.   BACK: No kyphosis or scoliosis; no CVA tenderness ABDOMEN: soft and non-tender; no masses, no organomegaly, normal abdominal bowel sounds; no pannus; no intertriginous candida. There is no rebound and no distention. Rectal Exam: Not done EXTREMITIES: No bone or joint deformity; age-appropriate arthropathy of the hands and knees; no edema; no ulcerations.  There is no calf tenderness. Genitalia: not examined PULSES: 2+ and symmetric SKIN: Normal hydration no rash or ulceration CNS: Cranial nerves 2-12 grossly intact no focal lateralizing neurologic deficit.  Speech is fluent; uvula elevated with phonation, facial symmetry and tongue midline. DTR are normal bilaterally, cerebella exam is intact, barbinski is negative and strengths are equaled bilaterally.  No sensory loss.   Labs on Admission:  Basic Metabolic Panel:  Recent Labs Lab 01/17/16 1628 01/17/16 1643 01/18/16 0406  NA 130* 134* 143  K 5.3* 5.5* 4.7  CL 102 102 112*  CO2 19*  --  24  GLUCOSE 565* 558* 48*  BUN 39* 36* 35*  CREATININE 1.24* 1.00 0.94  CALCIUM 8.1*  --  8.5*  MG 2.3  --   --   PHOS 3.7  --   --    Liver Function Tests:  Recent Labs Lab 01/17/16 1628 01/18/16 0406  AST 16 12*  ALT 12* 9*  ALKPHOS 169* 122  BILITOT 0.9 0.3  PROT 6.1* 5.4*  ALBUMIN 3.6 3.2*   CBC:  Recent Labs Lab 01/17/16 1628 01/17/16 1643 01/18/16 0406  WBC 5.7  --  5.9  NEUTROABS 4.9  --   --   HGB 10.4* 9.9* 9.7*  HCT 29.5* 29.0* 28.2*  MCV 82.4  --  82.5  PLT 149*  --  148*   Cardiac Enzymes:  Recent Labs Lab 01/17/16 1628  CKTOTAL 24*  TROPONINI <0.03    CBG:  Recent Labs Lab 01/17/16 1838 01/17/16 2147 01/18/16 0008 01/18/16 0536 01/18/16 0742  GLUCAP 393* 99 71 75 123*     Radiological Exams on Admission: Ct Head Wo Contrast  Result Date: 01/17/2016 CLINICAL DATA:  Via EMS for evaluation seizure activity. No history of seizures. Fell on Sunday and hit her head. Knot  on the forehead and left posterior. EXAM: CT HEAD WITHOUT CONTRAST TECHNIQUE: Contiguous axial images were obtained from the base of the skull through the vertex without intravenous contrast. COMPARISON:  08/13/2015 FINDINGS: There is central and  cortical atrophy. Periventricular white matter changes are consistent with small vessel disease. There is no intra or extra-axial fluid collection or mass lesion. The basilar cisterns and ventricles have a normal appearance. There is no CT evidence for acute infarction or hemorrhage. Large left parietal scalp hematoma is present. No underlying calvarial fracture. Paranasal sinuses and mastoid air cells are normally aerated. IMPRESSION: 1. Atrophy and small vessel disease. 2.  No evidence for acute intracranial abnormality. 3. Large left posterior parietal scalp hematoma. Electronically Signed   By: Nolon Nations M.D.   On: 01/17/2016 18:26   Dg Chest Port 1 View  Result Date: 01/17/2016 CLINICAL DATA:  69 year old female status post fall, seizure yesterday. Initial encounter. EXAM: PORTABLE CHEST 1 VIEW COMPARISON:  08/13/2015 and earlier. FINDINGS: Portable AP semi upright view at 1624 hours. Stable lung volumes. Improved bibasilar ventilation. Pulmonary vascular congestion without overt edema. Normal cardiac size and mediastinal contours. Visualized tracheal air column is within normal limits. No pneumothorax or pleural effusion. IMPRESSION: No acute cardiopulmonary abnormality. Electronically Signed   By: Genevie Ann M.D.   On: 01/17/2016 16:45   Assessment/Plan  PLAN:  Seizures:  I am not convinced she needs to be on ACD, given it was in the setting of severe metabolic derrangements with hyperosmolar.  Her seizure was somewhat similar to a [place] March Seizure.  Will continue with Keppra for now, and defer to neurology.  She is getting MRI.  I told her not to drive when discharge until cleared by neurology.  EEG pending.   DM:  BS 123 now.  Mental status  is clear.  Other plans as per orders. Code Status: FULL Haskel Khan, MD.  FACP [facility] Pager [phone] 7pm to 7am.  01/18/2016, 9:01 AM

## 2016-01-19 DIAGNOSIS — R269 Unspecified abnormalities of gait and mobility: Secondary | ICD-10-CM | POA: Diagnosis not present

## 2016-01-19 DIAGNOSIS — G9340 Encephalopathy, unspecified: Secondary | ICD-10-CM | POA: Diagnosis not present

## 2016-01-19 DIAGNOSIS — R569 Unspecified convulsions: Secondary | ICD-10-CM | POA: Diagnosis not present

## 2016-01-19 LAB — GLUCOSE, CAPILLARY
Glucose-Capillary: 52 mg/dL — ABNORMAL LOW (ref 65–99)
Glucose-Capillary: 113 mg/dL — ABNORMAL HIGH (ref 65–99)
Glucose-Capillary: 200 mg/dL — ABNORMAL HIGH (ref 65–99)
Glucose-Capillary: 157 mg/dL — ABNORMAL HIGH (ref 65–99)
Glucose-Capillary: 201 mg/dL — ABNORMAL HIGH (ref 65–99)
Glucose-Capillary: 77 mg/dL (ref 65–99)
Glucose-Capillary: 95 mg/dL (ref 65–99)

## 2016-01-19 LAB — RETICULOCYTES
RBC.: 3.6 MIL/uL — ABNORMAL LOW (ref 3.87–5.11)
RETIC CT PCT: 3.2 % — AB (ref 0.4–3.1)
Retic Count, Absolute: 115.2 10*3/uL (ref 19.0–186.0)

## 2016-01-19 LAB — IRON AND TIBC
Iron: 54 ug/dL (ref 28–170)
SATURATION RATIOS: 19 % (ref 10.4–31.8)
TIBC: 287 ug/dL (ref 250–450)
UIBC: 233 ug/dL

## 2016-01-19 LAB — FERRITIN: FERRITIN: 91 ng/mL (ref 11–307)

## 2016-01-19 LAB — FOLATE: Folate: 22.5 ng/mL (ref >5.9)

## 2016-01-19 LAB — VITAMIN B12: Vitamin B-12: 168 pg/mL — ABNORMAL LOW (ref 180–914)

## 2016-01-19 MED ORDER — ACETAMINOPHEN 500 MG PO TABS
1000.0000 mg | ORAL_TABLET | ORAL | Status: DC | PRN
  Administered 2016-01-19 – 2016-01-20 (×2): 1000 mg via ORAL
  Filled 2016-01-19 (×2): qty 2

## 2016-01-19 NOTE — Consult Note (Signed)
Duvall A. Merlene Laughter, MD     [email or web address]          Jaclyn Herrera is an 70 y.o. female.   ASSESSMENT/PLAN: This is most likely a case of provoked seizures due to hyperglycemia and/or acute head injury. Therefore, the risk of recurrent long-term unprovoked seizures is low likely does not warrant long-term antiepileptic medications. Even if this seizure is unprovoked, the risk of epilepsy still low given the normal imaging and normal EEG. For now, we will continue with the [place] which has been initiated. We will see the patient in the office in 2 months and make another reassessment at that time. We will likely wean her off her medications if she has not had any further events.   The patient is a 70 year old white female who lives by herself. She typically ambulates without assisted devices. The patient got to sit bathroom at night on Sunday and apparently ran into the mirror and sustained frontal and occipital head injuries. She did not appear to have lost consciousness. The following day she tells me that she had a seizure. The spell was witnessed by her caregiver. They describe the patient having what appears to be focal seizures with shaking/clonic activity and tonic activity involving the right side with subsequent limited progress to the left side. She had a few of these events that day. Interestingly, she does not report having loss of consciousness or alteration of consciousness with these events. The patient reports having significant body aches however. She complains of significant headaches. There is no previous history of seizures. No reports of GI symptoms, chest pain or shortness of breath. The review of systems otherwise negative.    GENERAL: This a very pleasant female in no acute distress.  HEENT: Supple. Frontal laceration 1 inch mid forehead region. Large scalp hematoma left parietal region.  ABDOMEN: soft  EXTREMITIES: Nonpitting edema right ankle  status post multiple surgery. Slightly reduced range of motion. There is marked arthritic changes of the knees bilaterally.  BACK: Normal.  SKIN: Normal by inspection.    MENTAL STATUS: Alert and oriented. Speech, language and cognition are generally intact. Judgment and insight normal.   CRANIAL NERVES: Pupils are equal, round and reactive to light and accommodation; extra ocular movements are full, there is no significant nystagmus; visual fields are full; upper and lower facial muscles are normal in strength and symmetric, there is no flattening of the nasolabial folds; tongue is midline; uvula is midline; shoulder elevation is normal.  MOTOR: Normal tone, bulk and strength; no pronator drift.  COORDINATION: Left finger to nose is normal, right finger to nose is normal, No rest tremor; no intention tremor; no postural tremor; no bradykinesia.  REFLEXES: Deep tendon reflexes are symmetrical and normal. Babinski reflexes are flexor bilaterally.   SENSATION: Normal to light touch.    Blood pressure (!) 172/67, pulse 74, temperature 97.6 F (36.4 C), temperature source Oral, resp. rate 17, height 5' 8"  (1.727 m), weight 203 lb 11.3 oz (92.4 kg), SpO2 100 %.  Past Medical History:  Diagnosis Date  . 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 Arthritis   . Bronchitis   . CHF (congestive heart failure) ([place])   . Chronic diastolic heart failure ([place]) 07/13/2015  . Diabetes mellitus   . DKA (diabetic ketoacidoses) ([place]) 09/27/2014  . GERD (gastroesophageal reflux disease)   . Headache   . Hyperlipidemia   . Hypertension   . Hypothyroid 10/31/2014  .  hypothyroidism   . Pneumonia    hx of   . PONV (postoperative nausea and vomiting)    and slow to wake up after foot surgery   . Shingles   . Syncope 10/31/2014  . Vertigo 10/31/2014    Past Surgical History:  Procedure Laterality Date  . ANKLE SURGERY     x 4  . APPENDECTOMY    . CATARACT EXTRACTION Bilateral   .  CHOLECYSTECTOMY    . HARDWARE REMOVAL Left 11/03/2015   Procedure: HARDWARE REMOVAL LEFT FOOT appliction of wound vac;  Surgeon: Gaynelle Arabian, MD;  Location: [facility];  Service: Orthopedics;  Laterality: Left;  . INCISION AND DRAINAGE OF WOUND Left 11/03/2015   Procedure: IRRIGATION AND DEBRIDEMENT LEFT FOOT ULCER;  Surgeon: Gaynelle Arabian, MD;  Location: [facility];  Service: Orthopedics;  Laterality: Left;  . KNEE ARTHROSCOPY    . NASAL SINUS SURGERY    . non cancerous mass removed     small intestine  . ORIF CALCANEOUS FRACTURE Left 12/29/2014   Procedure: OPEN REDUCTION INTERNAL FIXATION  LEFT CALCANEOUS FRACTURE;  Surgeon: Gaynelle Arabian, MD;  Location: [facility];  Service: Orthopedics;  Laterality: Left;  . TONSILLECTOMY      Family History  Problem Relation Age of Onset  . Diabetes Mother   . Asthma Mother   . Hypertension Father   . Heart disease Father   . Diabetes Father   . Heart disease Sister   . Hypertension Sister   . Asthma Sister   . Diabetes Brother   . CAD Brother   . Stroke Brother     Social History:  reports that she has never smoked. She has never used smokeless tobacco. She reports that she does not drink alcohol or use drugs.  Allergies:  Allergies  Allergen Reactions  . Sulfonamide Derivatives Anaphylaxis  . Bactrim [Sulfamethoxazole-Trimethoprim] Nausea Only  . Erythromycin Nausea And Vomiting  . Latex Itching and Other (See Comments)    If she wears gloves too long her hands start itching  . Mucinex [Guaifenesin Er] Nausea And Vomiting  . Aspirin Rash  . Levofloxacin Rash  . Tape Rash    Medications: Prior to Admission medications   Medication Sig Start Date End Date Taking? Authorizing Provider  acetaminophen (TYLENOL) 500 MG tablet Take 500-1,000 mg by mouth every 6 (six) hours as needed (For pain.).    Yes Historical Provider, MD  albuterol (PROVENTIL HFA;VENTOLIN HFA) 108 (90 BASE) MCG/ACT inhaler Inhale 2 puffs into the lungs every 4 (four) hours  as needed for wheezing or shortness of breath.   Yes Historical Provider, MD  carvedilol (COREG) 3.125 MG tablet Take 3.125 mg by mouth 2 (two) times daily with a meal.  12/23/15  Yes Historical Provider, MD  esomeprazole (NEXIUM) 20 MG capsule Take 20 mg by mouth daily.    Yes Historical Provider, MD  fexofenadine (ALLEGRA) 180 MG tablet Take 180 mg by mouth daily as needed for allergies or rhinitis.   Yes Historical Provider, MD  fluticasone (FLONASE) 50 MCG/ACT nasal spray Place 2 sprays into the nose 2 (two) times daily as needed for allergies.  10/28/14  Yes Historical Provider, MD  insulin regular human CONCENTRATED (HUMULIN R) 500 UNIT/ML injection Inject 0.02 mLs (10 Units total) into the skin daily. Sliding scale range is reported 60-150 units based on a sliding scale. She takes it 30 minutes before meals. Patient taking differently: Inject 5-10 Units into the skin 3 (three) times daily with meals.  08/14/15  Yes Asencion Noble, MD  levothyroxine (SYNTHROID, LEVOTHROID) 125 MCG tablet Take 125-187.5 mcg by mouth daily before breakfast. Take one tablet daily except for on Sundays. Take one and a half tablets on that day.   Yes Historical Provider, MD  ondansetron (ZOFRAN) 4 MG tablet Take 1 tablet (4 mg total) by mouth every 6 (six) hours as needed for nausea. 01/01/15  Yes Arlee Muslim, PA-C  PARoxetine (PAXIL) 40 MG tablet Take 40 mg by mouth daily.   Yes Historical Provider, MD  Polyethylene Glycol 400 (BLINK TEARS OP) Place 1 drop into both eyes 4 (four) times daily as needed (Dry eyes).    Yes Historical Provider, MD  potassium chloride (K-DUR) 10 MEQ tablet Take 1 tablet (10 mEq total) by mouth daily. 09/15/15  Yes Burnell Blanks, MD  quinapril (ACCUPRIL) 40 MG tablet Take 40 mg by mouth daily.    Yes Historical Provider, MD  Senna-Psyllium (PERDIEM PO) Take 1 tablet by mouth daily as needed (For constipation.).    Yes Historical Provider, MD  simvastatin (ZOCOR) 20 MG tablet Take 20 mg by  mouth at bedtime.    Yes Historical Provider, MD  torsemide (DEMADEX) 20 MG tablet Take 1 tablet (20 mg total) by mouth 2 (two) times daily. Patient taking differently: Take 20 mg by mouth daily.  09/15/15  Yes Burnell Blanks, MD    Scheduled Meds: . carvedilol  3.125 mg Oral BID WC  . Chlorhexidine Gluconate Cloth  6 each Topical Q0600  . enoxaparin (LOVENOX) injection  40 mg Subcutaneous Q24H  . fluticasone  2 spray Each Nare Daily  . insulin aspart  0-15 Units Subcutaneous Q4H  . insulin aspart  10 Units Subcutaneous TID WC  . insulin glargine  20 Units Subcutaneous QHS  . levETIRAcetam  1,000 mg Intravenous Q12H  . levothyroxine  125 mcg Oral Once per day on Mon Tue Wed Thu Fri Sat   And  . [START ON 01/23/2016] levothyroxine  187.5 mcg Oral Once per day on Sun  . lisinopril  40 mg Oral Daily  . loratadine  10 mg Oral Daily  . mupirocin ointment  1 application Nasal BID  . pantoprazole  40 mg Oral Daily  . PARoxetine  40 mg Oral Daily  . simvastatin  20 mg Oral QHS   Continuous Infusions:  PRN Meds:.albuterol     Results for orders placed or performed during the hospital encounter of 01/17/16 (from the past 48 hour(s))  CBG monitoring, ED     Status: Abnormal   Collection Time: 01/17/16  4:09 PM  Result Value Ref Range   Glucose-Capillary 497 (H) 65 - 99 mg/dL  Ethanol     Status: None   Collection Time: 01/17/16  4:28 PM  Result Value Ref Range   Alcohol, Ethyl (B) <5 <5 mg/dL    Comment:        LOWEST DETECTABLE LIMIT FOR SERUM ALCOHOL IS 5 mg/dL FOR MEDICAL PURPOSES ONLY   Protime-INR     Status: None   Collection Time: 01/17/16  4:28 PM  Result Value Ref Range   Prothrombin Time 13.0 11.4 - 15.2 seconds   INR 0.98   APTT     Status: None   Collection Time: 01/17/16  4:28 PM  Result Value Ref Range   aPTT 29 24 - 36 seconds  CBC     Status: Abnormal   Collection Time: 01/17/16  4:28 PM  Result Value Ref Range  WBC 5.7 4.0 - 10.5 K/uL   RBC 3.58  (L) 3.87 - 5.11 MIL/uL   Hemoglobin 10.4 (L) 12.0 - 15.0 g/dL   HCT 29.5 (L) 36.0 - 46.0 %   MCV 82.4 78.0 - 100.0 fL   MCH 29.1 26.0 - 34.0 pg   MCHC 35.3 30.0 - 36.0 g/dL   RDW 14.2 11.5 - 15.5 %   Platelets 149 (L) 150 - 400 K/uL  Differential     Status: Abnormal   Collection Time: 01/17/16  4:28 PM  Result Value Ref Range   Neutrophils Relative % 84 %   Neutro Abs 4.9 1.7 - 7.7 K/uL   Lymphocytes Relative 8 %   Lymphs Abs 0.4 (L) 0.7 - 4.0 K/uL   Monocytes Relative 7 %   Monocytes Absolute 0.4 0.1 - 1.0 K/uL   Eosinophils Relative 1 %   Eosinophils Absolute 0.0 0.0 - 0.7 K/uL   Basophils Relative 0 %   Basophils Absolute 0.0 0.0 - 0.1 K/uL  Comprehensive metabolic panel     Status: Abnormal   Collection Time: 01/17/16  4:28 PM  Result Value Ref Range   Sodium 130 (L) 135 - 145 mmol/L   Potassium 5.3 (H) 3.5 - 5.1 mmol/L   Chloride 102 101 - 111 mmol/L   CO2 19 (L) 22 - 32 mmol/L   Glucose, Bld 565 (HH) 65 - 99 mg/dL    Comment: CRITICAL RESULT CALLED TO, READ BACK BY AND VERIFIED WITH: COCKRAM,R ON 01/17/16 AT 1740 BY LOY,C    BUN 39 (H) 6 - 20 mg/dL   Creatinine, Ser 1.24 (H) 0.44 - 1.00 mg/dL   Calcium 8.1 (L) 8.9 - 10.3 mg/dL   Total Protein 6.1 (L) 6.5 - 8.1 g/dL   Albumin 3.6 3.5 - 5.0 g/dL   AST 16 15 - 41 U/L   ALT 12 (L) 14 - 54 U/L   Alkaline Phosphatase 169 (H) 38 - 126 U/L   Total Bilirubin 0.9 0.3 - 1.2 mg/dL   GFR calc non Af Amer 43 (L) >60 mL/min   GFR calc Af Amer 49 (L) >60 mL/min    Comment: (NOTE) The eGFR has been calculated using the CKD EPI equation. This calculation has not been validated in all clinical situations. eGFR's persistently <60 mL/min signify possible Chronic Kidney Disease.    Anion gap 9 5 - 15  Troponin I     Status: None   Collection Time: 01/17/16  4:28 PM  Result Value Ref Range   Troponin I <0.03 <0.03 ng/mL  CK     Status: Abnormal   Collection Time: 01/17/16  4:28 PM  Result Value Ref Range   Total CK 24 (L) 38 -  234 U/L  Magnesium     Status: None   Collection Time: 01/17/16  4:28 PM  Result Value Ref Range   Magnesium 2.3 1.7 - 2.4 mg/dL  Phosphorus     Status: None   Collection Time: 01/17/16  4:28 PM  Result Value Ref Range   Phosphorus 3.7 2.5 - 4.6 mg/dL  TSH     Status: None   Collection Time: 01/17/16  4:28 PM  Result Value Ref Range   TSH 1.655 0.350 - 4.500 uIU/mL  I-Stat Chem 8, ED  (not at [facility])     Status: Abnormal   Collection Time: 01/17/16  4:43 PM  Result Value Ref Range   Sodium 134 (L) 135 - 145 mmol/L   Potassium 5.5 (H)  3.5 - 5.1 mmol/L   Chloride 102 101 - 111 mmol/L   BUN 36 (H) 6 - 20 mg/dL   Creatinine, Ser 1.00 0.44 - 1.00 mg/dL   Glucose, Bld 558 (HH) 65 - 99 mg/dL   Calcium, Ion 1.19 1.12 - 1.23 mmol/L   TCO2 19 0 - 100 mmol/L   Hemoglobin 9.9 (L) 12.0 - 15.0 g/dL   HCT 29.0 (L) 36.0 - 46.0 %  Urine rapid drug screen (hosp performed)not at [facility]     Status: None   Collection Time: 01/17/16  4:57 PM  Result Value Ref Range   Opiates NONE DETECTED NONE DETECTED   Cocaine NONE DETECTED NONE DETECTED   Benzodiazepines NONE DETECTED NONE DETECTED   Amphetamines NONE DETECTED NONE DETECTED   Tetrahydrocannabinol NONE DETECTED NONE DETECTED   Barbiturates NONE DETECTED NONE DETECTED    Comment:        DRUG SCREEN FOR MEDICAL PURPOSES ONLY.  IF CONFIRMATION IS NEEDED FOR ANY PURPOSE, NOTIFY LAB WITHIN 5 DAYS.        LOWEST DETECTABLE LIMITS FOR URINE DRUG SCREEN Drug Class       Cutoff (ng/mL) Amphetamine      1000 Barbiturate      200 Benzodiazepine   811 Tricyclics       572 Opiates          300 Cocaine          300 THC              50   Urinalysis, Routine w reflex microscopic (not at [facility])     Status: Abnormal   Collection Time: 01/17/16  4:57 PM  Result Value Ref Range   Color, Urine YELLOW YELLOW   APPearance CLEAR CLEAR   Specific Gravity, Urine 1.020 1.005 - 1.030   pH 5.5 5.0 - 8.0   Glucose, UA >1000 (A) NEGATIVE mg/dL   Hgb urine  dipstick TRACE (A) NEGATIVE   Bilirubin Urine NEGATIVE NEGATIVE   Ketones, ur 15 (A) NEGATIVE mg/dL   Protein, ur NEGATIVE NEGATIVE mg/dL   Nitrite NEGATIVE NEGATIVE   Leukocytes, UA NEGATIVE NEGATIVE  Urine microscopic-add on     Status: Abnormal   Collection Time: 01/17/16  4:57 PM  Result Value Ref Range   Squamous Epithelial / LPF 0-5 (A) NONE SEEN   WBC, UA 0-5 0 - 5 WBC/hpf   RBC / HPF 0-5 0 - 5 RBC/hpf   Bacteria, UA RARE (A) NONE SEEN  POC CBG, ED     Status: Abnormal   Collection Time: 01/17/16  6:38 PM  Result Value Ref Range   Glucose-Capillary 393 (H) 65 - 99 mg/dL  Glucose, capillary     Status: None   Collection Time: 01/17/16  9:47 PM  Result Value Ref Range   Glucose-Capillary 99 65 - 99 mg/dL  MRSA PCR Screening     Status: Abnormal   Collection Time: 01/17/16 10:03 PM  Result Value Ref Range   MRSA by PCR POSITIVE (A) NEGATIVE    Comment:        The GeneXpert MRSA Assay (FDA approved for NASAL specimens only), is one component of a comprehensive MRSA colonization surveillance program. It is not intended to diagnose MRSA infection nor to guide or monitor treatment for MRSA infections.   Glucose, capillary     Status: None   Collection Time: 01/18/16 12:08 AM  Result Value Ref Range   Glucose-Capillary 71 65 - 99 mg/dL  Comprehensive metabolic  panel     Status: Abnormal   Collection Time: 01/18/16  4:06 AM  Result Value Ref Range   Sodium 143 135 - 145 mmol/L    Comment: DELTA CHECK NOTED   Potassium 4.7 3.5 - 5.1 mmol/L   Chloride 112 (H) 101 - 111 mmol/L   CO2 24 22 - 32 mmol/L   Glucose, Bld 48 (L) 65 - 99 mg/dL   BUN 35 (H) 6 - 20 mg/dL   Creatinine, Ser 0.94 0.44 - 1.00 mg/dL   Calcium 8.5 (L) 8.9 - 10.3 mg/dL   Total Protein 5.4 (L) 6.5 - 8.1 g/dL   Albumin 3.2 (L) 3.5 - 5.0 g/dL   AST 12 (L) 15 - 41 U/L   ALT 9 (L) 14 - 54 U/L   Alkaline Phosphatase 122 38 - 126 U/L   Total Bilirubin 0.3 0.3 - 1.2 mg/dL   GFR calc non Af Amer 60 (L) >60  mL/min   GFR calc Af Amer >60 >60 mL/min    Comment: (NOTE) The eGFR has been calculated using the CKD EPI equation. This calculation has not been validated in all clinical situations. eGFR's persistently <60 mL/min signify possible Chronic Kidney Disease.    Anion gap 7 5 - 15  CBC     Status: Abnormal   Collection Time: 01/18/16  4:06 AM  Result Value Ref Range   WBC 5.9 4.0 - 10.5 K/uL   RBC 3.42 (L) 3.87 - 5.11 MIL/uL   Hemoglobin 9.7 (L) 12.0 - 15.0 g/dL   HCT 28.2 (L) 36.0 - 46.0 %   MCV 82.5 78.0 - 100.0 fL   MCH 28.4 26.0 - 34.0 pg   MCHC 34.4 30.0 - 36.0 g/dL   RDW 14.5 11.5 - 15.5 %   Platelets 148 (L) 150 - 400 K/uL  Glucose, capillary     Status: None   Collection Time: 01/18/16  5:36 AM  Result Value Ref Range   Glucose-Capillary 75 65 - 99 mg/dL  Glucose, capillary     Status: Abnormal   Collection Time: 01/18/16  7:42 AM  Result Value Ref Range   Glucose-Capillary 123 (H) 65 - 99 mg/dL   Comment 1 Notify RN    Comment 2 Document in Chart   Glucose, capillary     Status: Abnormal   Collection Time: 01/18/16 11:29 AM  Result Value Ref Range   Glucose-Capillary 311 (H) 65 - 99 mg/dL   Comment 1 Notify RN    Comment 2 Document in Chart   Glucose, capillary     Status: Abnormal   Collection Time: 01/18/16  4:39 PM  Result Value Ref Range   Glucose-Capillary 308 (H) 65 - 99 mg/dL   Comment 1 Notify RN    Comment 2 Document in Chart   Glucose, capillary     Status: Abnormal   Collection Time: 01/18/16  8:15 PM  Result Value Ref Range   Glucose-Capillary 146 (H) 65 - 99 mg/dL  Glucose, capillary     Status: Abnormal   Collection Time: 01/19/16 12:26 AM  Result Value Ref Range   Glucose-Capillary 52 (L) 65 - 99 mg/dL  Glucose, capillary     Status: Abnormal   Collection Time: 01/19/16  1:25 AM  Result Value Ref Range   Glucose-Capillary 113 (H) 65 - 99 mg/dL  Glucose, capillary     Status: Abnormal   Collection Time: 01/19/16  5:18 AM  Result Value Ref  Range   Glucose-Capillary 200 (H)  65 - 99 mg/dL  Glucose, capillary     Status: Abnormal   Collection Time: 01/19/16  7:53 AM  Result Value Ref Range   Glucose-Capillary 157 (H) 65 - 99 mg/dL   Comment 1 Notify RN   Vitamin B12     Status: Abnormal   Collection Time: 01/19/16  8:04 AM  Result Value Ref Range   Vitamin B-12 168 (L) 180 - 914 pg/mL    Comment: (NOTE) This assay is not validated for testing neonatal or myeloproliferative syndrome specimens for Vitamin B12 levels. Performed at [facility]   Folate     Status: None   Collection Time: 01/19/16  8:04 AM  Result Value Ref Range   Folate 22.5 >5.9 ng/mL    Comment: Performed at [facility]  Iron and TIBC     Status: None   Collection Time: 01/19/16  8:04 AM  Result Value Ref Range   Iron 54 28 - 170 ug/dL   TIBC 287 250 - 450 ug/dL   Saturation Ratios 19 10.4 - 31.8 %   UIBC 233 ug/dL    Comment: Performed at [facility]  Ferritin     Status: None   Collection Time: 01/19/16  8:04 AM  Result Value Ref Range   Ferritin 91 11 - 307 ng/mL    Comment: Performed at [facility]  Reticulocytes     Status: Abnormal   Collection Time: 01/19/16  8:04 AM  Result Value Ref Range   Retic Ct Pct 3.2 (H) 0.4 - 3.1 %   RBC. 3.60 (L) 3.87 - 5.11 MIL/uL   Retic Count, Manual 115.2 19.0 - 186.0 K/uL  Glucose, capillary     Status: Abnormal   Collection Time: 01/19/16 11:24 AM  Result Value Ref Range   Glucose-Capillary 201 (H) 65 - 99 mg/dL   Comment 1 Notify RN     Studies/Results: HEAD CT FINDINGS: There is central and cortical atrophy. Periventricular white matter changes are consistent with small vessel disease. There is no intra or extra-axial fluid collection or mass lesion. The basilar cisterns and ventricles have a normal appearance. There is no CT evidence for acute infarction or hemorrhage. Large left parietal scalp hematoma is present. No underlying calvarial fracture. Paranasal  sinuses and mastoid air cells are normally aerated.  IMPRESSION: 1. Atrophy and small vessel disease. 2.  No evidence for acute intracranial abnormality. 3. Large left posterior parietal scalp hematoma.     BRAIN MRI FINDINGS: Image quality degraded by motion most notably the postcontrast images.  Generalized atrophy.  Negative for hydrocephalus  Negative for acute infarct. Minimal chronic ischemic change in the white matter.  Negative for intracranial hemorrhage.  No subdural fluid collection.  Negative for mass or edema.  No shift of the midline structures.  Postcontrast imaging degraded by motion.  Normal enhancement.  Large left parietal scalp hematoma similar to yesterday.  Paranasal sinuses clear. Orbital contents normal. Bilateral cataract extraction.  IMPRESSION: Large left parietal scalp hematoma.  No acute intracranial abnormality.     Necia Kamm A. Merlene Laughter, M.D.  Diplomate, Tax adviser of Psychiatry and Neurology ( Neurology). 01/19/2016, 2:22 PM

## 2016-01-19 NOTE — Progress Notes (Signed)
Subjective: She has had no further seizures. She feels some discomfort with her scalp hematoma.  Objective: Vital signs in last 24 hours: Vitals:   01/19/16 0400 01/19/16 0500 01/19/16 0533 01/19/16 0600  BP:   (!) 161/67 (!) 165/65  Pulse:      Resp: (!) 31  12 20   Temp: 97.6 F (36.4 C)     TempSrc: Oral     SpO2:      Weight:  203 lb 11.3 oz (92.4 kg)    Height:       Weight change: 23 lb 11.3 oz (10.8 kg)  Intake/Output Summary (Last 24 hours) at 01/19/16 0749 Last data filed at 01/19/16 0500  Gross per 24 hour  Intake             1070 ml  Output             2301 ml  Net            -1231 ml    Physical Exam: Alert. No distress. Left posterior parietal scalp hematoma is tender. No bleeding. Speech intact. Cognitive status intact. Lungs clear. Heart regular with no murmurs. Hypertensive this morning. Abdomen nontender with no hepatosplenomegaly. Right lower leg is chronically larger than the left. No calf tenderness. No edema. No focal weakness noted  Lab Results:    Results for orders placed or performed during the hospital encounter of 01/17/16 (from the past 24 hour(s))  Glucose, capillary     Status: Abnormal   Collection Time: 01/18/16 11:29 AM  Result Value Ref Range   Glucose-Capillary 311 (H) 65 - 99 mg/dL   Comment 1 Notify RN    Comment 2 Document in Chart   Glucose, capillary     Status: Abnormal   Collection Time: 01/18/16  4:39 PM  Result Value Ref Range   Glucose-Capillary 308 (H) 65 - 99 mg/dL   Comment 1 Notify RN    Comment 2 Document in Chart   Glucose, capillary     Status: Abnormal   Collection Time: 01/18/16  8:15 PM  Result Value Ref Range   Glucose-Capillary 146 (H) 65 - 99 mg/dL  Glucose, capillary     Status: Abnormal   Collection Time: 01/19/16 12:26 AM  Result Value Ref Range   Glucose-Capillary 52 (L) 65 - 99 mg/dL  Glucose, capillary     Status: Abnormal   Collection Time: 01/19/16  1:25 AM  Result Value Ref Range    Glucose-Capillary 113 (H) 65 - 99 mg/dL  Glucose, capillary     Status: Abnormal   Collection Time: 01/19/16  5:18 AM  Result Value Ref Range   Glucose-Capillary 200 (H) 65 - 99 mg/dL     ABGS  Recent Labs  01/17/16 1643  TCO2 19   CULTURES Recent Results (from the past 240 hour(s))  MRSA PCR Screening     Status: Abnormal   Collection Time: 01/17/16 10:03 PM  Result Value Ref Range Status   MRSA by PCR POSITIVE (A) NEGATIVE Final    Comment:        The GeneXpert MRSA Assay (FDA approved for NASAL specimens only), is one component of a comprehensive MRSA colonization surveillance program. It is not intended to diagnose MRSA infection nor to guide or monitor treatment for MRSA infections.    Studies/Results: Ct Head Wo Contrast  Result Date: 01/17/2016 CLINICAL DATA:  Via EMS for evaluation seizure activity. No history of seizures. Fell on Sunday and hit her head. Knot  on the forehead and left posterior. EXAM: CT HEAD WITHOUT CONTRAST TECHNIQUE: Contiguous axial images were obtained from the base of the skull through the vertex without intravenous contrast. COMPARISON:  08/13/2015 FINDINGS: There is central and cortical atrophy. Periventricular white matter changes are consistent with small vessel disease. There is no intra or extra-axial fluid collection or mass lesion. The basilar cisterns and ventricles have a normal appearance. There is no CT evidence for acute infarction or hemorrhage. Large left parietal scalp hematoma is present. No underlying calvarial fracture. Paranasal sinuses and mastoid air cells are normally aerated. IMPRESSION: 1. Atrophy and small vessel disease. 2.  No evidence for acute intracranial abnormality. 3. Large left posterior parietal scalp hematoma. Electronically Signed   By: Nolon Nations M.D.   On: 01/17/2016 18:26   Mr Jeri Cos [id number] Contrast  Result Date: 01/18/2016 CLINICAL DATA:  Seizure.  Fell and hit head yesterday. EXAM: MRI HEAD WITHOUT AND  WITH CONTRAST TECHNIQUE: Multiplanar, multiecho pulse sequences of the brain and surrounding structures were obtained without and with intravenous contrast. CONTRAST:  74mL MULTIHANCE GADOBENATE DIMEGLUMINE 529 MG/ML IV SOLN COMPARISON:  CT head 01/17/2016 FINDINGS: Image quality degraded by motion most notably the postcontrast images. Generalized atrophy.  Negative for hydrocephalus Negative for acute infarct. Minimal chronic ischemic change in the white matter. Negative for intracranial hemorrhage.  No subdural fluid collection. Negative for mass or edema.  No shift of the midline structures. Postcontrast imaging degraded by motion.  Normal enhancement. Large left parietal scalp hematoma similar to yesterday. Paranasal sinuses clear. Orbital contents normal. Bilateral cataract extraction. IMPRESSION: Large left parietal scalp hematoma. No acute intracranial abnormality. Electronically Signed   By: Franchot Gallo M.D.   On: 01/18/2016 11:24   Dg Chest Port 1 View  Result Date: 01/17/2016 CLINICAL DATA:  70 year old female status post fall, seizure yesterday. Initial encounter. EXAM: PORTABLE CHEST 1 VIEW COMPARISON:  08/13/2015 and earlier. FINDINGS: Portable AP semi upright view at 1624 hours. Stable lung volumes. Improved bibasilar ventilation. Pulmonary vascular congestion without overt edema. Normal cardiac size and mediastinal contours. Visualized tracheal air column is within normal limits. No pneumothorax or pleural effusion. IMPRESSION: No acute cardiopulmonary abnormality. Electronically Signed   By: Genevie Ann M.D.   On: 01/17/2016 16:45   Micro Results: Recent Results (from the past 240 hour(s))  MRSA PCR Screening     Status: Abnormal   Collection Time: 01/17/16 10:03 PM  Result Value Ref Range Status   MRSA by PCR POSITIVE (A) NEGATIVE Final    Comment:        The GeneXpert MRSA Assay (FDA approved for NASAL specimens only), is one component of a comprehensive MRSA  colonization surveillance program. It is not intended to diagnose MRSA infection nor to guide or monitor treatment for MRSA infections.    Studies/Results: Ct Head Wo Contrast  Result Date: 01/17/2016 CLINICAL DATA:  Via EMS for evaluation seizure activity. No history of seizures. Fell on Sunday and hit her head. Knot on the forehead and left posterior. EXAM: CT HEAD WITHOUT CONTRAST TECHNIQUE: Contiguous axial images were obtained from the base of the skull through the vertex without intravenous contrast. COMPARISON:  08/13/2015 FINDINGS: There is central and cortical atrophy. Periventricular white matter changes are consistent with small vessel disease. There is no intra or extra-axial fluid collection or mass lesion. The basilar cisterns and ventricles have a normal appearance. There is no CT evidence for acute infarction or hemorrhage. Large left parietal scalp hematoma is present. No  underlying calvarial fracture. Paranasal sinuses and mastoid air cells are normally aerated. IMPRESSION: 1. Atrophy and small vessel disease. 2.  No evidence for acute intracranial abnormality. 3. Large left posterior parietal scalp hematoma. Electronically Signed   By: Nolon Nations M.D.   On: 01/17/2016 18:26   Mr Jeri Cos [id number] Contrast  Result Date: 01/18/2016 CLINICAL DATA:  Seizure.  Fell and hit head yesterday. EXAM: MRI HEAD WITHOUT AND WITH CONTRAST TECHNIQUE: Multiplanar, multiecho pulse sequences of the brain and surrounding structures were obtained without and with intravenous contrast. CONTRAST:  12mL MULTIHANCE GADOBENATE DIMEGLUMINE 529 MG/ML IV SOLN COMPARISON:  CT head 01/17/2016 FINDINGS: Image quality degraded by motion most notably the postcontrast images. Generalized atrophy.  Negative for hydrocephalus Negative for acute infarct. Minimal chronic ischemic change in the white matter. Negative for intracranial hemorrhage.  No subdural fluid collection. Negative for mass or edema.  No shift of the  midline structures. Postcontrast imaging degraded by motion.  Normal enhancement. Large left parietal scalp hematoma similar to yesterday. Paranasal sinuses clear. Orbital contents normal. Bilateral cataract extraction. IMPRESSION: Large left parietal scalp hematoma. No acute intracranial abnormality. Electronically Signed   By: Franchot Gallo M.D.   On: 01/18/2016 11:24   Dg Chest Port 1 View  Result Date: 01/17/2016 CLINICAL DATA:  70 year old female status post fall, seizure yesterday. Initial encounter. EXAM: PORTABLE CHEST 1 VIEW COMPARISON:  08/13/2015 and earlier. FINDINGS: Portable AP semi upright view at 1624 hours. Stable lung volumes. Improved bibasilar ventilation. Pulmonary vascular congestion without overt edema. Normal cardiac size and mediastinal contours. Visualized tracheal air column is within normal limits. No pneumothorax or pleural effusion. IMPRESSION: No acute cardiopulmonary abnormality. Electronically Signed   By: Genevie Ann M.D.   On: 01/17/2016 16:45   Medications:  I have reviewed the patient's current medications Scheduled Meds: . carvedilol  3.125 mg Oral BID WC  . Chlorhexidine Gluconate Cloth  6 each Topical Q0600  . enoxaparin (LOVENOX) injection  40 mg Subcutaneous Q24H  . fluticasone  2 spray Each Nare Daily  . insulin aspart  0-15 Units Subcutaneous Q4H  . insulin aspart  10 Units Subcutaneous TID WC  . insulin glargine  20 Units Subcutaneous QHS  . levETIRAcetam  1,000 mg Intravenous Q12H  . levothyroxine  125 mcg Oral Once per day on Mon Tue Wed Thu Fri Sat   And  . [START ON 01/23/2016] levothyroxine  187.5 mcg Oral Once per day on Sun  . lisinopril  40 mg Oral Daily  . loratadine  10 mg Oral Daily  . mupirocin ointment  1 application Nasal BID  . pantoprazole  40 mg Oral Daily  . PARoxetine  40 mg Oral Daily  . simvastatin  20 mg Oral QHS   Continuous Infusions:  PRN Meds:.albuterol   Assessment/Plan: #1. New onset seizures. Continue Keppra. Consult  neurology. She now consents to having neurology consultation. MRI reveals no acute abnormality. #2. Diabetes. Fasting glucose 200. She received Lantus 20 units last night. Continue Lantus and NovoLog. Endocrinologist is Dr. Forde Dandy. #3. Anemia. Obtain anemia profile. #4. Hypertension. Continue lisinopril and carvedilol. #5. Chronic diastolic heart failure. Stable.  EEG results pending. Active Problems:   Seizure ([place])     LOS: 2 days   Leinani Lisbon 01/19/2016, 7:49 AM

## 2016-01-19 NOTE — Care Management Important Message (Signed)
Important Message  Patient Details  Name: Jaclyn Herrera MRN: [id number] Date of Birth: 20-Jan-1946   Medicare Important Message Given:  Yes    Sherald Barge, RN 01/19/2016, 10:37 AM

## 2016-01-19 NOTE — Care Management Note (Signed)
Case Management Note  Patient Details  Name: LIZBETH OMORI MRN: [id number] Date of Birth: Jan 07, 1946  Subjective/Objective:                  Pt admitted with seizures. Pt is from home, lives alone and is ind with ADL's. Pt has PCP and was driving herself to appointments prior to admission. Pt has cane and walker to use if needed but was ambulating independently prior to admission. Pt active with [facility] for nursing services. Tim Justis, of [facility], aware of admission and will be updated on pt's discharge.   Action/Plan: Will cont to follow.   Expected Discharge Date:  01/19/16               Expected Discharge Plan:  [place]  In-House Referral:  NA  Discharge planning Services  CM Consult  Post Acute Care Choice:  Home Health, Resumption of Svcs/PTA Provider Choice offered to:  Patient  DME Arranged:    DME Agency:     HH Arranged:  RN [place] Agency:  [facility] [employer]  Status of Service:  In process, will continue to follow  If discussed at Long Length of Stay Meetings, dates discussed:    Additional Comments:  Sherald Barge, RN 01/19/2016, 10:33 AM

## 2016-01-19 NOTE — Progress Notes (Signed)
Inpatient Diabetes Program Recommendations  AACE/ADA: New Consensus Statement on Inpatient Glycemic Control (2015)  Target Ranges:  Prepandial:   less than 140 mg/dL      Peak postprandial:   less than 180 mg/dL (1-2 hours)      Critically ill patients:  140 - 180 mg/dL   Results for Jaclyn Herrera, Jaclyn Herrera (MRN [id number]) as of 01/19/2016 07:26  Ref. Range 01/18/2016 07:42 01/18/2016 11:29 01/18/2016 16:39 01/18/2016 20:15 01/19/2016 00:26 01/19/2016 01:25 01/19/2016 05:18  Glucose-Capillary Latest Ref Range: 65 - 99 mg/dL 123 (H)  Novolog 2 units 311 (H)  Novolog 11 units 308 (H)  Novolog 21 units (10 MC + 11 SSI) 146 (H)  Novolog 2 units@ 20:40  Lantus 20 units @ 21:34 52 (L) 113 (H) 200 (H)  Novolog 3 units   Review of Glycemic Control  Diabetes history: DM2 Outpatient Diabetes medications: Humulin R U500 25-50 units TID with meals (patient uses U-100 insulin syringe to draw up from vial so actual dosages are 25-50 units TID with meals) Current orders for Inpatient glycemic control: Lantus 20 units QHS, Novolog 0-15 units Q4H, Novolog 10 units TID with meals for meal coverage   Inpatient Diabetes Program Recommendations: Insulin - Basal: Patient received Lantus 20 units at 21:34 on 01/18/16 and glucose down to 52 mg/dl at 00:26 on 01/19/16. Please consider decreasing Lantus to 18 units QHS. Correction (SSI): Since patient is eating and tolerating diet, please consider changing frequency of CBGs and Novolog correction to 0-15 units TID with meals and 0-5 units QHS. Insulin - Meal Coverage: Please consider increasing meal coverage to Novolog 12 units TID with meals if patient eats at least 50% of meals.  Thanks, Barnie Alderman, RN, MSN, CDE Diabetes Coordinator Inpatient Diabetes Program [phone] (Team Pager from [place] to [place]) [phone] ([facility] office) [phone] [facility] office) [phone] [facility] office)

## 2016-01-19 NOTE — Progress Notes (Signed)
Pt is complaining of worsening right foot pain. States she thinks it may be "cracked" or broken. Medial side of foot is red and foot is swollen.

## 2016-01-19 NOTE — Procedures (Signed)
Jaclyn Spring A. Merlene Laughter, MD     [email or web address]           HISTORY: This is a 70 year old female who presents with new onset seizures.  MEDICATIONS: Scheduled Meds: . carvedilol  3.125 mg Oral BID WC  . Chlorhexidine Gluconate Cloth  6 each Topical Q0600  . enoxaparin (LOVENOX) injection  40 mg Subcutaneous Q24H  . fluticasone  2 spray Each Nare Daily  . insulin aspart  0-15 Units Subcutaneous Q4H  . insulin aspart  10 Units Subcutaneous TID WC  . insulin glargine  20 Units Subcutaneous QHS  . levETIRAcetam  1,000 mg Intravenous Q12H  . levothyroxine  125 mcg Oral Once per day on Mon Tue Wed Thu Fri Sat   And  . [START ON 01/23/2016] levothyroxine  187.5 mcg Oral Once per day on Sun  . lisinopril  40 mg Oral Daily  . loratadine  10 mg Oral Daily  . mupirocin ointment  1 application Nasal BID  . pantoprazole  40 mg Oral Daily  . PARoxetine  40 mg Oral Daily  . simvastatin  20 mg Oral QHS   Continuous Infusions:  PRN Meds:.albuterol  Prior to Admission medications   Medication Sig Start Date End Date Taking? Authorizing Provider  acetaminophen (TYLENOL) 500 MG tablet Take 500-1,000 mg by mouth every 6 (six) hours as needed (For pain.).    Yes Historical Provider, MD  albuterol (PROVENTIL HFA;VENTOLIN HFA) 108 (90 BASE) MCG/ACT inhaler Inhale 2 puffs into the lungs every 4 (four) hours as needed for wheezing or shortness of breath.   Yes Historical Provider, MD  carvedilol (COREG) 3.125 MG tablet Take 3.125 mg by mouth 2 (two) times daily with a meal.  12/23/15  Yes Historical Provider, MD  esomeprazole (NEXIUM) 20 MG capsule Take 20 mg by mouth daily.    Yes Historical Provider, MD  fexofenadine (ALLEGRA) 180 MG tablet Take 180 mg by mouth daily as needed for allergies or rhinitis.   Yes Historical Provider, MD  fluticasone (FLONASE) 50 MCG/ACT nasal spray Place 2 sprays into the nose 2 (two) times daily as needed for allergies.  10/28/14  Yes Historical  Provider, MD  insulin regular human CONCENTRATED (HUMULIN R) 500 UNIT/ML injection Inject 0.02 mLs (10 Units total) into the skin daily. Sliding scale range is reported 60-150 units based on a sliding scale. She takes it 30 minutes before meals. Patient taking differently: Inject 5-10 Units into the skin 3 (three) times daily with meals.  08/14/15  Yes Asencion Noble, MD  levothyroxine (SYNTHROID, LEVOTHROID) 125 MCG tablet Take 125-187.5 mcg by mouth daily before breakfast. Take one tablet daily except for on Sundays. Take one and a half tablets on that day.   Yes Historical Provider, MD  ondansetron (ZOFRAN) 4 MG tablet Take 1 tablet (4 mg total) by mouth every 6 (six) hours as needed for nausea. 01/01/15  Yes Arlee Muslim, PA-C  PARoxetine (PAXIL) 40 MG tablet Take 40 mg by mouth daily.   Yes Historical Provider, MD  Polyethylene Glycol 400 (BLINK TEARS OP) Place 1 drop into both eyes 4 (four) times daily as needed (Dry eyes).    Yes Historical Provider, MD  potassium chloride (K-DUR) 10 MEQ tablet Take 1 tablet (10 mEq total) by mouth daily. 09/15/15  Yes Burnell Blanks, MD  quinapril (ACCUPRIL) 40 MG tablet Take 40 mg by mouth daily.    Yes Historical Provider, MD  Senna-Psyllium (PERDIEM PO) Take 1 tablet by mouth daily as  needed (For constipation.).    Yes Historical Provider, MD  simvastatin (ZOCOR) 20 MG tablet Take 20 mg by mouth at bedtime.    Yes Historical Provider, MD  torsemide (DEMADEX) 20 MG tablet Take 1 tablet (20 mg total) by mouth 2 (two) times daily. Patient taking differently: Take 20 mg by mouth daily.  09/15/15  Yes Burnell Blanks, MD      ANALYSIS: A 16 channel recording using standard 10 20 measurements is conducted for 22 minutes. The background activity mostly is in the theta range with a maximum of 6 Hz. There is significant sleep architecture seen throughout the recording with K complexes and sleep spindles observed. Photic stimulation and hyperventilation are not  conducted. There is no focal or lateralized slowing. There is no epileptiform activity is observed.    IMPRESSION: This recording shows  Moderate global slowing indicating a moderate global encephalopathy. However, there is no epileptiform activity observed.      Anastassia Noack A. Merlene Herrera, M.D.  Diplomate, Tax adviser of Psychiatry and Neurology ( Neurology).

## 2016-01-20 ENCOUNTER — Inpatient Hospital Stay (HOSPITAL_COMMUNITY): Payer: Medicare Other

## 2016-01-20 LAB — GLUCOSE, CAPILLARY
Glucose-Capillary: 152 mg/dL — ABNORMAL HIGH (ref 65–99)
Glucose-Capillary: 166 mg/dL — ABNORMAL HIGH (ref 65–99)
Glucose-Capillary: 173 mg/dL — ABNORMAL HIGH (ref 65–99)
Glucose-Capillary: 191 mg/dL — ABNORMAL HIGH (ref 65–99)
Glucose-Capillary: 195 mg/dL — ABNORMAL HIGH (ref 65–99)
Glucose-Capillary: 196 mg/dL — ABNORMAL HIGH (ref 65–99)
Glucose-Capillary: 337 mg/dL — ABNORMAL HIGH (ref 65–99)

## 2016-01-20 LAB — URIC ACID: URIC ACID, SERUM: 6.2 mg/dL (ref 2.3–6.6)

## 2016-01-20 LAB — BASIC METABOLIC PANEL
Anion gap: 4 — ABNORMAL LOW (ref 5–15)
BUN: 23 mg/dL — ABNORMAL HIGH (ref 6–20)
CO2: 24 mmol/L (ref 22–32)
Calcium: 8 mg/dL — ABNORMAL LOW (ref 8.9–10.3)
Chloride: 109 mmol/L (ref 101–111)
Creatinine, Ser: 0.86 mg/dL (ref 0.44–1.00)
GFR calc Af Amer: >60 mL/min (ref >60)
GFR calc non Af Amer: >60 mL/min (ref >60)
Glucose, Bld: 162 mg/dL — ABNORMAL HIGH (ref 65–99)
Potassium: 4.2 mmol/L (ref 3.5–5.1)
Sodium: 137 mmol/L (ref 135–145)

## 2016-01-20 MED ORDER — COLCHICINE 0.6 MG PO TABS: 0.6000 mg | ORAL_TABLET | Freq: Two times a day (BID) | ORAL | Status: DC

## 2016-01-20 MED ORDER — COLCHICINE 0.6 MG PO TABS
0.6000 mg | ORAL_TABLET | Freq: Once | ORAL | Status: AC
  Administered 2016-01-20: 0.6 mg via ORAL
  Filled 2016-01-20: qty 1

## 2016-01-20 MED ORDER — COLCHICINE 0.6 MG PO TABS
1.2000 mg | ORAL_TABLET | Freq: Once | ORAL | Status: AC
  Administered 2016-01-20: 1.2 mg via ORAL
  Filled 2016-01-20: qty 2

## 2016-01-20 MED ORDER — LEVETIRACETAM 500 MG PO TABS
500.0000 mg | ORAL_TABLET | Freq: Two times a day (BID) | ORAL | Status: DC
  Administered 2016-01-20 – 2016-01-21 (×3): 500 mg via ORAL
  Filled 2016-01-20 (×3): qty 1

## 2016-01-20 NOTE — Progress Notes (Signed)
Subjective: She denies any further seizures. She complains of right foot pain.  Objective: Vital signs in last 24 hours: Vitals:   01/19/16 1617 01/19/16 1700 01/19/16 2000 01/20/16 0430  BP:  (!) 182/70 (!) 167/63   Pulse:      Resp:  14 17   Temp: 98.2 F (36.8 C)  99.1 F (37.3 C) (!) 96.9 F (36.1 C)  TempSrc: Oral  Oral Oral  SpO2:      Weight:    203 lb 4.2 oz (92.2 kg)  Height:       Weight change: -7.1 oz (-0.2 kg)  Intake/Output Summary (Last 24 hours) at 01/20/16 0734 Last data filed at 01/19/16 2046  Gross per 24 hour  Intake              340 ml  Output                0 ml  Net              340 ml    Physical Exam: Alert. No distress. Lungs clear. Heart regular with no murmurs. Pressure improved. Abdomen nontender with no hepatosplenomegaly. Right first MTP is red, tender and swollen. Neurologic status grossly intact. She has not been able to get up and walk around because she is still in the ICU evidently because there is no bed on the floor.  Lab Results:    Results for orders placed or performed during the hospital encounter of 01/17/16 (from the past 24 hour(s))  Glucose, capillary     Status: Abnormal   Collection Time: 01/19/16  7:53 AM  Result Value Ref Range   Glucose-Capillary 157 (H) 65 - 99 mg/dL   Comment 1 Notify RN   Vitamin B12     Status: Abnormal   Collection Time: 01/19/16  8:04 AM  Result Value Ref Range   Vitamin B-12 168 (L) 180 - 914 pg/mL  Folate     Status: None   Collection Time: 01/19/16  8:04 AM  Result Value Ref Range   Folate 22.5 >5.9 ng/mL  Iron and TIBC     Status: None   Collection Time: 01/19/16  8:04 AM  Result Value Ref Range   Iron 54 28 - 170 ug/dL   TIBC 287 250 - 450 ug/dL   Saturation Ratios 19 10.4 - 31.8 %   UIBC 233 ug/dL  Ferritin     Status: None   Collection Time: 01/19/16  8:04 AM  Result Value Ref Range   Ferritin 91 11 - 307 ng/mL  Reticulocytes     Status: Abnormal   Collection Time: 01/19/16   8:04 AM  Result Value Ref Range   Retic Ct Pct 3.2 (H) 0.4 - 3.1 %   RBC. 3.60 (L) 3.87 - 5.11 MIL/uL   Retic Count, Manual 115.2 19.0 - 186.0 K/uL  Glucose, capillary     Status: Abnormal   Collection Time: 01/19/16 11:24 AM  Result Value Ref Range   Glucose-Capillary 201 (H) 65 - 99 mg/dL   Comment 1 Notify RN   Glucose, capillary     Status: None   Collection Time: 01/19/16  4:07 PM  Result Value Ref Range   Glucose-Capillary 77 65 - 99 mg/dL   Comment 1 Notify RN   Glucose, capillary     Status: None   Collection Time: 01/19/16  8:18 PM  Result Value Ref Range   Glucose-Capillary 95 65 - 99 mg/dL   Comment 1  Notify RN   Glucose, capillary     Status: Abnormal   Collection Time: 01/19/16 11:59 PM  Result Value Ref Range   Glucose-Capillary 196 (H) 65 - 99 mg/dL  Glucose, capillary     Status: Abnormal   Collection Time: 01/20/16  4:29 AM  Result Value Ref Range   Glucose-Capillary 173 (H) 65 - 99 mg/dL  Basic metabolic panel     Status: Abnormal   Collection Time: 01/20/16  4:34 AM  Result Value Ref Range   Sodium 137 135 - 145 mmol/L   Potassium 4.2 3.5 - 5.1 mmol/L   Chloride 109 101 - 111 mmol/L   CO2 24 22 - 32 mmol/L   Glucose, Bld 162 (H) 65 - 99 mg/dL   BUN 23 (H) 6 - 20 mg/dL   Creatinine, Ser 0.86 0.44 - 1.00 mg/dL   Calcium 8.0 (L) 8.9 - 10.3 mg/dL   GFR calc non Af Amer >60 >60 mL/min   GFR calc Af Amer >60 >60 mL/min   Anion gap 4 (L) 5 - 15     ABGS  Recent Labs  01/17/16 1643  TCO2 19   CULTURES Recent Results (from the past 240 hour(s))  MRSA PCR Screening     Status: Abnormal   Collection Time: 01/17/16 10:03 PM  Result Value Ref Range Status   MRSA by PCR POSITIVE (A) NEGATIVE Final    Comment:        The GeneXpert MRSA Assay (FDA approved for NASAL specimens only), is one component of a comprehensive MRSA colonization surveillance program. It is not intended to diagnose MRSA infection nor to guide or monitor treatment for MRSA  infections.    Studies/Results: Mr Jeri Cos [id number] Contrast  Result Date: 01/18/2016 CLINICAL DATA:  Seizure.  Fell and hit head yesterday. EXAM: MRI HEAD WITHOUT AND WITH CONTRAST TECHNIQUE: Multiplanar, multiecho pulse sequences of the brain and surrounding structures were obtained without and with intravenous contrast. CONTRAST:  56mL MULTIHANCE GADOBENATE DIMEGLUMINE 529 MG/ML IV SOLN COMPARISON:  CT head 01/17/2016 FINDINGS: Image quality degraded by motion most notably the postcontrast images. Generalized atrophy.  Negative for hydrocephalus Negative for acute infarct. Minimal chronic ischemic change in the white matter. Negative for intracranial hemorrhage.  No subdural fluid collection. Negative for mass or edema.  No shift of the midline structures. Postcontrast imaging degraded by motion.  Normal enhancement. Large left parietal scalp hematoma similar to yesterday. Paranasal sinuses clear. Orbital contents normal. Bilateral cataract extraction. IMPRESSION: Large left parietal scalp hematoma. No acute intracranial abnormality. Electronically Signed   By: Franchot Gallo M.D.   On: 01/18/2016 11:24   Micro Results: Recent Results (from the past 240 hour(s))  MRSA PCR Screening     Status: Abnormal   Collection Time: 01/17/16 10:03 PM  Result Value Ref Range Status   MRSA by PCR POSITIVE (A) NEGATIVE Final    Comment:        The GeneXpert MRSA Assay (FDA approved for NASAL specimens only), is one component of a comprehensive MRSA colonization surveillance program. It is not intended to diagnose MRSA infection nor to guide or monitor treatment for MRSA infections.    Studies/Results: Mr Jeri Cos [id number] Contrast  Result Date: 01/18/2016 CLINICAL DATA:  Seizure.  Fell and hit head yesterday. EXAM: MRI HEAD WITHOUT AND WITH CONTRAST TECHNIQUE: Multiplanar, multiecho pulse sequences of the brain and surrounding structures were obtained without and with intravenous contrast. CONTRAST:  41mL  MULTIHANCE GADOBENATE DIMEGLUMINE 529 MG/ML IV  SOLN COMPARISON:  CT head 01/17/2016 FINDINGS: Image quality degraded by motion most notably the postcontrast images. Generalized atrophy.  Negative for hydrocephalus Negative for acute infarct. Minimal chronic ischemic change in the white matter. Negative for intracranial hemorrhage.  No subdural fluid collection. Negative for mass or edema.  No shift of the midline structures. Postcontrast imaging degraded by motion.  Normal enhancement. Large left parietal scalp hematoma similar to yesterday. Paranasal sinuses clear. Orbital contents normal. Bilateral cataract extraction. IMPRESSION: Large left parietal scalp hematoma. No acute intracranial abnormality. Electronically Signed   By: Franchot Gallo M.D.   On: 01/18/2016 11:24   Medications:  I have reviewed the patient's current medications Scheduled Meds: . carvedilol  3.125 mg Oral BID WC  . Chlorhexidine Gluconate Cloth  6 each Topical Q0600  . colchicine  0.6 mg Oral TID WC  . enoxaparin (LOVENOX) injection  40 mg Subcutaneous Q24H  . fluticasone  2 spray Each Nare Daily  . insulin aspart  0-15 Units Subcutaneous Q4H  . insulin aspart  10 Units Subcutaneous TID WC  . insulin glargine  20 Units Subcutaneous QHS  . levETIRAcetam  500 mg Oral BID  . levothyroxine  125 mcg Oral Once per day on Mon Tue Wed Thu Fri Sat   And  . [START ON 01/23/2016] levothyroxine  187.5 mcg Oral Once per day on Sun  . lisinopril  40 mg Oral Daily  . loratadine  10 mg Oral Daily  . mupirocin ointment  1 application Nasal BID  . pantoprazole  40 mg Oral Daily  . PARoxetine  40 mg Oral Daily  . simvastatin  20 mg Oral QHS   Continuous Infusions:  PRN Meds:.acetaminophen, albuterol   Assessment/Plan: #1. Seizures. EEG reveals no seizure activity. Neurology consult note evidently pending. Will discuss with neurology. Will switch Keppra to 500 mg orally twice a day. Moved to a regular room so that she can get up and  walk around today. #2. Diabetes. Glucose 162. #3. Gout. Check uric acid level. Check right foot x-ray. Treat with colchicine. #4. Vitamin B-12 deficiency. Check methylmalonic acid and anti-intrinsic factor antibody and anti-parietal cell antibody. #5. Anemia. Iron normal Active Problems:   Seizure ([place])     LOS: 3 days   Verina Galeno 01/20/2016, 7:34 AM

## 2016-01-20 NOTE — Care Management Note (Signed)
Case Management Note  Patient Details  Name: KATELYN KROHN MRN: [id number] Date of Birth: Feb 20, 1946   Expected Discharge Date:  01/19/16               Expected Discharge Plan:  [place]  In-House Referral:  NA  Discharge planning Services  CM Consult  Post Acute Care Choice:  [place], Resumption of Svcs/PTA Provider Choice offered to:  Patient  DME Arranged:    DME Agency:     HH Arranged:  RN, PT, Nurse's Aide [place] Agency:  [facility] (formerly [facility]  Status of Service:  In process, will continue to follow  If discussed at Long Length of Stay Meetings, dates discussed:    Additional Comments: Pt has requested PT and aid be added to [facility] services. [facility] updated on needs.   Sherald Barge, RN 01/20/2016, 2:24 PM

## 2016-01-21 DIAGNOSIS — S92414A Nondisplaced fracture of proximal phalanx of right great toe, initial encounter for closed fracture: Secondary | ICD-10-CM

## 2016-01-21 DIAGNOSIS — S92416A Nondisplaced fracture of proximal phalanx of unspecified great toe, initial encounter for closed fracture: Secondary | ICD-10-CM

## 2016-01-21 LAB — INTRINSIC FACTOR ANTIBODIES: Intrinsic Factor: 1 AU/mL (ref 0.0–1.1)

## 2016-01-21 LAB — GLUCOSE, CAPILLARY
Glucose-Capillary: 198 mg/dL — ABNORMAL HIGH (ref 65–99)
Glucose-Capillary: 139 mg/dL — ABNORMAL HIGH (ref 65–99)

## 2016-01-21 LAB — ANTI-PARIETAL ANTIBODY: Parietal Cell Antibody-IgG: 2.9 Units (ref 0.0–20.0)

## 2016-01-21 MED ORDER — MAGNESIUM HYDROXIDE 400 MG/5ML PO SUSP
30.0000 mL | Freq: Once | ORAL | Status: AC
  Administered 2016-01-21: 30 mL via ORAL
  Filled 2016-01-21: qty 30

## 2016-01-21 MED ORDER — LEVETIRACETAM 500 MG PO TABS: 500.0000 mg | ORAL_TABLET | Freq: Two times a day (BID) | ORAL | 3 refills | Status: AC

## 2016-01-21 NOTE — Discharge Summary (Signed)
Physician Discharge Summary  [name] [id number] DOB: 22-Jul-1945 DOA: 01/17/2016   Admit date: 01/17/2016 Discharge date: 01/21/2016  Discharge Diagnoses: #1. Seizure. #2. Poorly controlled diabetes. #3. Right first toe fracture. #4. Left parietal hematoma. #5. Vitamin B-12 deficiency. #6. Hypothyroidism. #7. Chronic diastolic heart failure. #8. Hypertension. Active Problems:   Seizure ([place])    Wt Readings from Last 3 Encounters:  01/20/16 202 lb 3.2 oz (91.7 kg)  11/03/15 190 lb (86.2 kg)  11/01/15 190 lb (86.2 kg)     Hospital Course:  This patient is a 70 year old female who presented with new onset seizures. She had fallen at home and hit her head. She was markedly hyperglycemic with a glucose greater than 500. MRI of her brain revealed no acute abnormality. She was found to have a left parietal hematoma from her fall. She was treated with fluids and insulin. Her hyperglycemia improved. She was treated with Keppra. She was seen in neurology consultation. She had an EEG which revealed no epileptiform activity. She complained of pain in her right first toe and was found to have a small fracture in the proximal phalanx. Orthopedics has been consulted. She was found to have a low vitamin B-12 level associated with an anemia. Antiparietal cell and anti-intrinsic factor antibodies are pending. Methylmalonic acid is pending. She was stable from a heart failure standpoint during her hospitalization.  Neurology has advised continued anticonvulsant therapy with follow-up in 2 months. She was advised not to drive during this period.  Condition at discharge is much improved. She will follow-up in my office in one week.  Diabetes has been managed with Lantus and NovoLog during her hospitalization but she will return to her U500 at 5 units 3 times a day after discharge. She will follow-up with her endocrinologist as an outpatient.  Discharge exam reveals a stable parietal hematoma. She  also has some bruising on her for head and a shallow healing laceration there. Neurologic status is grossly intact. Lungs clear. Heart regular with no murmurs.   Discharge Instructions      Jaclyn Herrera 01/21/2016

## 2016-01-21 NOTE — Consult Note (Signed)
Reason for Consult: Proximal phalanx fracture right great toe Referring Physician: Dr. Jorge Ny is an 70 y.o. female.  HPI: She is 70 years old she had a seizure. Part of her workup included x-ray of her foot. It showed a proximal phalanx fracture.  Location right great toe Quality of pain dull ache Severity mild Timing several days Improving Associated with swelling  Past Medical History:  Diagnosis Date  . 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 Arthritis   . Bronchitis   . CHF (congestive heart failure) ([place])   . Chronic diastolic heart failure ([place]) 07/13/2015  . Diabetes mellitus   . DKA (diabetic ketoacidoses) ([place]) 09/27/2014  . GERD (gastroesophageal reflux disease)   . Headache   . Hyperlipidemia   . Hypertension   . Hypothyroid 10/31/2014  . hypothyroidism   . Pneumonia    hx of   . PONV (postoperative nausea and vomiting)    and slow to wake up after foot surgery   . Shingles   . Syncope 10/31/2014  . Vertigo 10/31/2014    Past Surgical History:  Procedure Laterality Date  . ANKLE SURGERY     x 4  . APPENDECTOMY    . CATARACT EXTRACTION Bilateral   . CHOLECYSTECTOMY    . HARDWARE REMOVAL Left 11/03/2015   Procedure: HARDWARE REMOVAL LEFT FOOT appliction of wound vac;  Surgeon: Gaynelle Arabian, MD;  Location: [facility];  Service: Orthopedics;  Laterality: Left;  . INCISION AND DRAINAGE OF WOUND Left 11/03/2015   Procedure: IRRIGATION AND DEBRIDEMENT LEFT FOOT ULCER;  Surgeon: Gaynelle Arabian, MD;  Location: [facility];  Service: Orthopedics;  Laterality: Left;  . KNEE ARTHROSCOPY    . NASAL SINUS SURGERY    . non cancerous mass removed     small intestine  . ORIF CALCANEOUS FRACTURE Left 12/29/2014   Procedure: OPEN REDUCTION INTERNAL FIXATION  LEFT CALCANEOUS FRACTURE;  Surgeon: Gaynelle Arabian, MD;  Location: [facility];  Service: Orthopedics;  Laterality: Left;  . TONSILLECTOMY      Family History  Problem Relation Age of Onset  . Diabetes  Mother   . Asthma Mother   . Hypertension Father   . Heart disease Father   . Diabetes Father   . Heart disease Sister   . Hypertension Sister   . Asthma Sister   . Diabetes Brother   . CAD Brother   . Stroke Brother     Social History:  reports that she has never smoked. She has never used smokeless tobacco. She reports that she does not drink alcohol or use drugs.  Allergies:  Allergies  Allergen Reactions  . Sulfonamide Derivatives Anaphylaxis  . Bactrim [Sulfamethoxazole-Trimethoprim] Nausea Only  . Erythromycin Nausea And Vomiting  . Latex Itching and Other (See Comments)    If she wears gloves too long her hands start itching  . Mucinex [Guaifenesin Er] Nausea And Vomiting  . Aspirin Rash  . Levofloxacin Rash  . Tape Rash    Medications: I have reviewed the patient's current medications.  Results for orders placed or performed during the hospital encounter of 01/17/16 (from the past 48 hour(s))  Glucose, capillary     Status: Abnormal   Collection Time: 01/19/16 11:24 AM  Result Value Ref Range   Glucose-Capillary 201 (H) 65 - 99 mg/dL   Comment 1 Notify RN   Glucose, capillary     Status: None   Collection Time: 01/19/16  4:07  PM  Result Value Ref Range   Glucose-Capillary 77 65 - 99 mg/dL   Comment 1 Notify RN   Glucose, capillary     Status: None   Collection Time: 01/19/16  8:18 PM  Result Value Ref Range   Glucose-Capillary 95 65 - 99 mg/dL   Comment 1 Notify RN   Glucose, capillary     Status: Abnormal   Collection Time: 01/19/16 11:59 PM  Result Value Ref Range   Glucose-Capillary 196 (H) 65 - 99 mg/dL  Glucose, capillary     Status: Abnormal   Collection Time: 01/20/16  4:29 AM  Result Value Ref Range   Glucose-Capillary 173 (H) 65 - 99 mg/dL  Basic metabolic panel     Status: Abnormal   Collection Time: 01/20/16  4:34 AM  Result Value Ref Range   Sodium 137 135 - 145 mmol/L   Potassium 4.2 3.5 - 5.1 mmol/L   Chloride 109 101 - 111 mmol/L    CO2 24 22 - 32 mmol/L   Glucose, Bld 162 (H) 65 - 99 mg/dL   BUN 23 (H) 6 - 20 mg/dL   Creatinine, Ser 0.86 0.44 - 1.00 mg/dL   Calcium 8.0 (L) 8.9 - 10.3 mg/dL   GFR calc non Af Amer >60 >60 mL/min   GFR calc Af Amer >60 >60 mL/min    Comment: (NOTE) The eGFR has been calculated using the CKD EPI equation. This calculation has not been validated in all clinical situations. eGFR's persistently <60 mL/min signify possible Chronic Kidney Disease.    Anion gap 4 (L) 5 - 15  Glucose, capillary     Status: Abnormal   Collection Time: 01/20/16  7:28 AM  Result Value Ref Range   Glucose-Capillary 195 (H) 65 - 99 mg/dL   Comment 1 Notify RN    Comment 2 Document in Chart   Uric acid     Status: None   Collection Time: 01/20/16  8:02 AM  Result Value Ref Range   Uric Acid, Serum 6.2 2.3 - 6.6 mg/dL  Glucose, capillary     Status: Abnormal   Collection Time: 01/20/16 11:17 AM  Result Value Ref Range   Glucose-Capillary 337 (H) 65 - 99 mg/dL  Glucose, capillary     Status: Abnormal   Collection Time: 01/20/16  4:38 PM  Result Value Ref Range   Glucose-Capillary 152 (H) 65 - 99 mg/dL  Glucose, capillary     Status: Abnormal   Collection Time: 01/20/16  8:07 PM  Result Value Ref Range   Glucose-Capillary 166 (H) 65 - 99 mg/dL  Glucose, capillary     Status: Abnormal   Collection Time: 01/20/16 11:54 PM  Result Value Ref Range   Glucose-Capillary 191 (H) 65 - 99 mg/dL   Comment 1 Notify RN    Comment 2 Document in Chart   Glucose, capillary     Status: Abnormal   Collection Time: 01/21/16  4:04 AM  Result Value Ref Range   Glucose-Capillary 198 (H) 65 - 99 mg/dL   Comment 1 Notify RN    Comment 2 Document in Chart   Glucose, capillary     Status: Abnormal   Collection Time: 01/21/16  8:08 AM  Result Value Ref Range   Glucose-Capillary 139 (H) 65 - 99 mg/dL    Dg Foot 2 Views Right  Result Date: 01/20/2016 CLINICAL DATA:  Right foot pain and swelling.  No reported injury.  EXAM: RIGHT FOOT - 2 VIEW COMPARISON:  None. FINDINGS: Partially visualized surgical hardware in the right ankle with lateral plate and interlocking screws in the right distal fibula and intra medullary rod with distal interlocking screws in the right distal tibia and single non interlocking screw in the medial malleolus. Diffuse osteopenia. There is an apparent cortical discontinuity in the distal portion of the proximal phalanx of the right first toe, seen only on the lateral view, suggestive of a nondisplaced fracture. No additional potential fracture. No dislocation. No suspicious focal osseous lesion. Small plantar right calcaneal spur. There is soft tissue swelling in the dorsal right foot. IMPRESSION: 1. Apparent nondisplaced fracture in the distal portion of the proximal phalanx of the right first toe, correlate with site of pain. 2. Diffuse osteopenia. Electronically Signed   By: Ilona Sorrel M.D.   On: 01/20/2016 09:23    Review of Systems  Constitutional: Negative for chills and fever.  Musculoskeletal: Positive for joint pain.  Neurological: Positive for dizziness and seizures.   Blood pressure (!) 166/55, pulse 68, temperature 98.6 F (37 C), temperature source Oral, resp. rate 20, height 5' 8" (1.727 m), weight 202 lb 3.2 oz (91.7 kg), SpO2 97 %. Physical Exam  Constitutional: She is oriented to person, place, and time. She appears well-developed and well-nourished. No distress.  HENT:  Head: Normocephalic and atraumatic.  Eyes: Conjunctivae and EOM are normal. Pupils are equal, round, and reactive to light. Right eye exhibits no discharge. Left eye exhibits no discharge. No scleral icterus.  Neck: Normal range of motion. Neck supple. No tracheal deviation present.  Cardiovascular: Normal rate, normal heart sounds and intact distal pulses.   Respiratory: Effort normal. No stridor. No respiratory distress.  Musculoskeletal:  Swelling and tenderness at the metatarsophalangeal joint.  The skin is ecchymotic. Toe motion is painful and limited by pain. Alignment is normal. In general the foot has no atrophy. Stability tests were compromised by pain and swelling but the joint felt stable.  Gait was not assessed as the patient was on bed rest  Neurological: She is alert and oriented to person, place, and time. No cranial nerve deficit. She exhibits abnormal muscle tone.  Skin: Skin is warm and dry. No rash noted. She is not diaphoretic. There is erythema.  Psychiatric: She has a normal mood and affect. Her behavior is normal. Judgment and thought content normal.    Assessment/Plan: X-rays show a proximal phalanx fracture right great toe nondisplaced mild comminution  Recommend hard sole shoe weightbearing as tolerated x-ray in 4 weeks   Arther Abbott 01/21/2016, 8:20 AM

## 2016-01-21 NOTE — Progress Notes (Signed)
Patient discharged home.  IV removed - WNL.  Reviewed medications and DC instructions - verbalizes understanding.  Follow up apt in place with PCP, neuro to call and make apt with patient.  No questions at this time.  Patient stable and in NAD.

## 2016-01-24 ENCOUNTER — Telehealth: Payer: Self-pay

## 2016-01-24 LAB — METHYLMALONIC ACID, SERUM: Methylmalonic Acid, Quantitative: 2237 nmol/L — ABNORMAL HIGH (ref 0–378)

## 2016-01-24 NOTE — Telephone Encounter (Signed)
-----   Message from Carole Civil, MD sent at 01/21/2016  8:28 AM EDT ----- Regarding: Appointment Needs appointment for 4 weeks for x-ray right foot

## 2016-01-24 NOTE — Telephone Encounter (Signed)
Per Pt she does not need appt to follow up

## 2016-01-25 DIAGNOSIS — Z4800 Encounter for change or removal of nonsurgical wound dressing: Secondary | ICD-10-CM | POA: Diagnosis not present

## 2016-01-25 DIAGNOSIS — L97422 Non-pressure chronic ulcer of left heel and midfoot with fat layer exposed: Secondary | ICD-10-CM | POA: Diagnosis not present

## 2016-01-25 DIAGNOSIS — E11621 Type 2 diabetes mellitus with foot ulcer: Secondary | ICD-10-CM | POA: Diagnosis not present

## 2016-01-25 DIAGNOSIS — I5032 Chronic diastolic (congestive) heart failure: Secondary | ICD-10-CM | POA: Diagnosis not present

## 2016-01-25 DIAGNOSIS — I110 Hypertensive heart disease with heart failure: Secondary | ICD-10-CM | POA: Diagnosis not present

## 2016-01-25 DIAGNOSIS — Z794 Long term (current) use of insulin: Secondary | ICD-10-CM | POA: Diagnosis not present

## 2016-01-27 DIAGNOSIS — I110 Hypertensive heart disease with heart failure: Secondary | ICD-10-CM | POA: Diagnosis not present

## 2016-01-27 DIAGNOSIS — Z794 Long term (current) use of insulin: Secondary | ICD-10-CM | POA: Diagnosis not present

## 2016-01-27 DIAGNOSIS — Z4800 Encounter for change or removal of nonsurgical wound dressing: Secondary | ICD-10-CM | POA: Diagnosis not present

## 2016-01-27 DIAGNOSIS — I5032 Chronic diastolic (congestive) heart failure: Secondary | ICD-10-CM | POA: Diagnosis not present

## 2016-01-27 DIAGNOSIS — E11621 Type 2 diabetes mellitus with foot ulcer: Secondary | ICD-10-CM | POA: Diagnosis not present

## 2016-01-27 DIAGNOSIS — L97422 Non-pressure chronic ulcer of left heel and midfoot with fat layer exposed: Secondary | ICD-10-CM | POA: Diagnosis not present

## 2016-01-28 DIAGNOSIS — E538 Deficiency of other specified B group vitamins: Secondary | ICD-10-CM | POA: Diagnosis not present

## 2016-01-28 DIAGNOSIS — L03114 Cellulitis of left upper limb: Secondary | ICD-10-CM | POA: Diagnosis not present

## 2016-01-28 DIAGNOSIS — G4089 Other seizures: Secondary | ICD-10-CM | POA: Diagnosis not present

## 2016-01-31 DIAGNOSIS — L97422 Non-pressure chronic ulcer of left heel and midfoot with fat layer exposed: Secondary | ICD-10-CM | POA: Diagnosis not present

## 2016-01-31 DIAGNOSIS — I5032 Chronic diastolic (congestive) heart failure: Secondary | ICD-10-CM | POA: Diagnosis not present

## 2016-01-31 DIAGNOSIS — I110 Hypertensive heart disease with heart failure: Secondary | ICD-10-CM | POA: Diagnosis not present

## 2016-01-31 DIAGNOSIS — Z4800 Encounter for change or removal of nonsurgical wound dressing: Secondary | ICD-10-CM | POA: Diagnosis not present

## 2016-01-31 DIAGNOSIS — E11621 Type 2 diabetes mellitus with foot ulcer: Secondary | ICD-10-CM | POA: Diagnosis not present

## 2016-01-31 DIAGNOSIS — Z794 Long term (current) use of insulin: Secondary | ICD-10-CM | POA: Diagnosis not present

## 2016-02-02 DIAGNOSIS — I10 Essential (primary) hypertension: Secondary | ICD-10-CM | POA: Diagnosis not present

## 2016-02-02 DIAGNOSIS — R269 Unspecified abnormalities of gait and mobility: Secondary | ICD-10-CM | POA: Diagnosis not present

## 2016-02-02 DIAGNOSIS — E1101 Type 2 diabetes mellitus with hyperosmolarity with coma: Secondary | ICD-10-CM | POA: Diagnosis not present

## 2016-02-02 DIAGNOSIS — L97422 Non-pressure chronic ulcer of left heel and midfoot with fat layer exposed: Secondary | ICD-10-CM | POA: Diagnosis not present

## 2016-02-02 DIAGNOSIS — I110 Hypertensive heart disease with heart failure: Secondary | ICD-10-CM | POA: Diagnosis not present

## 2016-02-02 DIAGNOSIS — G43C0 Periodic headache syndromes in child or adult, not intractable: Secondary | ICD-10-CM | POA: Diagnosis not present

## 2016-02-02 DIAGNOSIS — Z794 Long term (current) use of insulin: Secondary | ICD-10-CM | POA: Diagnosis not present

## 2016-02-02 DIAGNOSIS — E11621 Type 2 diabetes mellitus with foot ulcer: Secondary | ICD-10-CM | POA: Diagnosis not present

## 2016-02-02 DIAGNOSIS — E039 Hypothyroidism, unspecified: Secondary | ICD-10-CM | POA: Diagnosis not present

## 2016-02-02 DIAGNOSIS — R569 Unspecified convulsions: Secondary | ICD-10-CM | POA: Diagnosis not present

## 2016-02-02 DIAGNOSIS — Z4800 Encounter for change or removal of nonsurgical wound dressing: Secondary | ICD-10-CM | POA: Diagnosis not present

## 2016-02-02 DIAGNOSIS — I5032 Chronic diastolic (congestive) heart failure: Secondary | ICD-10-CM | POA: Diagnosis not present

## 2016-02-03 DIAGNOSIS — E11621 Type 2 diabetes mellitus with foot ulcer: Secondary | ICD-10-CM | POA: Diagnosis not present

## 2016-02-03 DIAGNOSIS — L97422 Non-pressure chronic ulcer of left heel and midfoot with fat layer exposed: Secondary | ICD-10-CM | POA: Diagnosis not present

## 2016-02-03 DIAGNOSIS — Z4800 Encounter for change or removal of nonsurgical wound dressing: Secondary | ICD-10-CM | POA: Diagnosis not present

## 2016-02-03 DIAGNOSIS — Z794 Long term (current) use of insulin: Secondary | ICD-10-CM | POA: Diagnosis not present

## 2016-02-03 DIAGNOSIS — I5032 Chronic diastolic (congestive) heart failure: Secondary | ICD-10-CM | POA: Diagnosis not present

## 2016-02-03 DIAGNOSIS — I110 Hypertensive heart disease with heart failure: Secondary | ICD-10-CM | POA: Diagnosis not present

## 2016-02-04 DIAGNOSIS — Z4789 Encounter for other orthopedic aftercare: Secondary | ICD-10-CM | POA: Diagnosis not present

## 2016-02-04 DIAGNOSIS — Z9889 Other specified postprocedural states: Secondary | ICD-10-CM | POA: Diagnosis not present

## 2016-02-08 DIAGNOSIS — L97422 Non-pressure chronic ulcer of left heel and midfoot with fat layer exposed: Secondary | ICD-10-CM | POA: Diagnosis not present

## 2016-02-08 DIAGNOSIS — I5032 Chronic diastolic (congestive) heart failure: Secondary | ICD-10-CM | POA: Diagnosis not present

## 2016-02-08 DIAGNOSIS — Z4800 Encounter for change or removal of nonsurgical wound dressing: Secondary | ICD-10-CM | POA: Diagnosis not present

## 2016-02-08 DIAGNOSIS — I110 Hypertensive heart disease with heart failure: Secondary | ICD-10-CM | POA: Diagnosis not present

## 2016-02-08 DIAGNOSIS — Z794 Long term (current) use of insulin: Secondary | ICD-10-CM | POA: Diagnosis not present

## 2016-02-08 DIAGNOSIS — E11621 Type 2 diabetes mellitus with foot ulcer: Secondary | ICD-10-CM | POA: Diagnosis not present

## 2016-02-09 DIAGNOSIS — L97422 Non-pressure chronic ulcer of left heel and midfoot with fat layer exposed: Secondary | ICD-10-CM | POA: Diagnosis not present

## 2016-02-09 DIAGNOSIS — Z794 Long term (current) use of insulin: Secondary | ICD-10-CM | POA: Diagnosis not present

## 2016-02-09 DIAGNOSIS — I5032 Chronic diastolic (congestive) heart failure: Secondary | ICD-10-CM | POA: Diagnosis not present

## 2016-02-09 DIAGNOSIS — E11621 Type 2 diabetes mellitus with foot ulcer: Secondary | ICD-10-CM | POA: Diagnosis not present

## 2016-02-09 DIAGNOSIS — Z4800 Encounter for change or removal of nonsurgical wound dressing: Secondary | ICD-10-CM | POA: Diagnosis not present

## 2016-02-09 DIAGNOSIS — I110 Hypertensive heart disease with heart failure: Secondary | ICD-10-CM | POA: Diagnosis not present

## 2016-02-10 DIAGNOSIS — E11621 Type 2 diabetes mellitus with foot ulcer: Secondary | ICD-10-CM | POA: Diagnosis not present

## 2016-02-10 DIAGNOSIS — I5032 Chronic diastolic (congestive) heart failure: Secondary | ICD-10-CM | POA: Diagnosis not present

## 2016-02-10 DIAGNOSIS — L97422 Non-pressure chronic ulcer of left heel and midfoot with fat layer exposed: Secondary | ICD-10-CM | POA: Diagnosis not present

## 2016-02-10 DIAGNOSIS — Z4800 Encounter for change or removal of nonsurgical wound dressing: Secondary | ICD-10-CM | POA: Diagnosis not present

## 2016-02-10 DIAGNOSIS — Z794 Long term (current) use of insulin: Secondary | ICD-10-CM | POA: Diagnosis not present

## 2016-02-10 DIAGNOSIS — I110 Hypertensive heart disease with heart failure: Secondary | ICD-10-CM | POA: Diagnosis not present

## 2016-02-15 DIAGNOSIS — E1140 Type 2 diabetes mellitus with diabetic neuropathy, unspecified: Secondary | ICD-10-CM | POA: Diagnosis not present

## 2016-02-15 DIAGNOSIS — N08 Glomerular disorders in diseases classified elsewhere: Secondary | ICD-10-CM | POA: Diagnosis not present

## 2016-02-15 DIAGNOSIS — Z6831 Body mass index (BMI) 31.0-31.9, adult: Secondary | ICD-10-CM | POA: Diagnosis not present

## 2016-02-15 DIAGNOSIS — G4089 Other seizures: Secondary | ICD-10-CM | POA: Diagnosis not present

## 2016-02-15 DIAGNOSIS — E784 Other hyperlipidemia: Secondary | ICD-10-CM | POA: Diagnosis not present

## 2016-02-15 DIAGNOSIS — E042 Nontoxic multinodular goiter: Secondary | ICD-10-CM | POA: Diagnosis not present

## 2016-02-15 DIAGNOSIS — I779 Disorder of arteries and arterioles, unspecified: Secondary | ICD-10-CM | POA: Diagnosis not present

## 2016-02-15 DIAGNOSIS — D588 Other specified hereditary hemolytic anemias: Secondary | ICD-10-CM | POA: Diagnosis not present

## 2016-02-15 DIAGNOSIS — I5033 Acute on chronic diastolic (congestive) heart failure: Secondary | ICD-10-CM | POA: Diagnosis not present

## 2016-02-15 DIAGNOSIS — K222 Esophageal obstruction: Secondary | ICD-10-CM | POA: Diagnosis not present

## 2016-02-15 DIAGNOSIS — Z23 Encounter for immunization: Secondary | ICD-10-CM | POA: Diagnosis not present

## 2016-02-15 DIAGNOSIS — D510 Vitamin B12 deficiency anemia due to intrinsic factor deficiency: Secondary | ICD-10-CM | POA: Diagnosis not present

## 2016-02-16 DIAGNOSIS — I5032 Chronic diastolic (congestive) heart failure: Secondary | ICD-10-CM | POA: Diagnosis not present

## 2016-02-16 DIAGNOSIS — I110 Hypertensive heart disease with heart failure: Secondary | ICD-10-CM | POA: Diagnosis not present

## 2016-02-16 DIAGNOSIS — Z794 Long term (current) use of insulin: Secondary | ICD-10-CM | POA: Diagnosis not present

## 2016-02-16 DIAGNOSIS — E11621 Type 2 diabetes mellitus with foot ulcer: Secondary | ICD-10-CM | POA: Diagnosis not present

## 2016-02-16 DIAGNOSIS — L97422 Non-pressure chronic ulcer of left heel and midfoot with fat layer exposed: Secondary | ICD-10-CM | POA: Diagnosis not present

## 2016-02-16 DIAGNOSIS — Z4800 Encounter for change or removal of nonsurgical wound dressing: Secondary | ICD-10-CM | POA: Diagnosis not present

## 2016-02-17 DIAGNOSIS — L97422 Non-pressure chronic ulcer of left heel and midfoot with fat layer exposed: Secondary | ICD-10-CM | POA: Diagnosis not present

## 2016-02-17 DIAGNOSIS — I110 Hypertensive heart disease with heart failure: Secondary | ICD-10-CM | POA: Diagnosis not present

## 2016-02-17 DIAGNOSIS — Z794 Long term (current) use of insulin: Secondary | ICD-10-CM | POA: Diagnosis not present

## 2016-02-17 DIAGNOSIS — I5032 Chronic diastolic (congestive) heart failure: Secondary | ICD-10-CM | POA: Diagnosis not present

## 2016-02-17 DIAGNOSIS — E11621 Type 2 diabetes mellitus with foot ulcer: Secondary | ICD-10-CM | POA: Diagnosis not present

## 2016-02-17 DIAGNOSIS — Z4800 Encounter for change or removal of nonsurgical wound dressing: Secondary | ICD-10-CM | POA: Diagnosis not present

## 2016-02-18 DIAGNOSIS — Z794 Long term (current) use of insulin: Secondary | ICD-10-CM | POA: Diagnosis not present

## 2016-02-18 DIAGNOSIS — I110 Hypertensive heart disease with heart failure: Secondary | ICD-10-CM | POA: Diagnosis not present

## 2016-02-18 DIAGNOSIS — I5032 Chronic diastolic (congestive) heart failure: Secondary | ICD-10-CM | POA: Diagnosis not present

## 2016-02-18 DIAGNOSIS — E11621 Type 2 diabetes mellitus with foot ulcer: Secondary | ICD-10-CM | POA: Diagnosis not present

## 2016-02-18 DIAGNOSIS — Z4800 Encounter for change or removal of nonsurgical wound dressing: Secondary | ICD-10-CM | POA: Diagnosis not present

## 2016-02-18 DIAGNOSIS — L97422 Non-pressure chronic ulcer of left heel and midfoot with fat layer exposed: Secondary | ICD-10-CM | POA: Diagnosis not present

## 2016-02-21 DIAGNOSIS — Z4800 Encounter for change or removal of nonsurgical wound dressing: Secondary | ICD-10-CM | POA: Diagnosis not present

## 2016-02-21 DIAGNOSIS — I5032 Chronic diastolic (congestive) heart failure: Secondary | ICD-10-CM | POA: Diagnosis not present

## 2016-02-21 DIAGNOSIS — I110 Hypertensive heart disease with heart failure: Secondary | ICD-10-CM | POA: Diagnosis not present

## 2016-02-21 DIAGNOSIS — L97422 Non-pressure chronic ulcer of left heel and midfoot with fat layer exposed: Secondary | ICD-10-CM | POA: Diagnosis not present

## 2016-02-21 DIAGNOSIS — E11621 Type 2 diabetes mellitus with foot ulcer: Secondary | ICD-10-CM | POA: Diagnosis not present

## 2016-02-21 DIAGNOSIS — Z794 Long term (current) use of insulin: Secondary | ICD-10-CM | POA: Diagnosis not present

## 2016-02-22 DIAGNOSIS — I110 Hypertensive heart disease with heart failure: Secondary | ICD-10-CM | POA: Diagnosis not present

## 2016-02-22 DIAGNOSIS — I5032 Chronic diastolic (congestive) heart failure: Secondary | ICD-10-CM | POA: Diagnosis not present

## 2016-02-22 DIAGNOSIS — L97422 Non-pressure chronic ulcer of left heel and midfoot with fat layer exposed: Secondary | ICD-10-CM | POA: Diagnosis not present

## 2016-02-22 DIAGNOSIS — Z4800 Encounter for change or removal of nonsurgical wound dressing: Secondary | ICD-10-CM | POA: Diagnosis not present

## 2016-02-22 DIAGNOSIS — Z794 Long term (current) use of insulin: Secondary | ICD-10-CM | POA: Diagnosis not present

## 2016-02-22 DIAGNOSIS — E11621 Type 2 diabetes mellitus with foot ulcer: Secondary | ICD-10-CM | POA: Diagnosis not present

## 2016-02-24 DIAGNOSIS — Z794 Long term (current) use of insulin: Secondary | ICD-10-CM | POA: Diagnosis not present

## 2016-02-24 DIAGNOSIS — E11621 Type 2 diabetes mellitus with foot ulcer: Secondary | ICD-10-CM | POA: Diagnosis not present

## 2016-02-24 DIAGNOSIS — Z4800 Encounter for change or removal of nonsurgical wound dressing: Secondary | ICD-10-CM | POA: Diagnosis not present

## 2016-02-24 DIAGNOSIS — L97422 Non-pressure chronic ulcer of left heel and midfoot with fat layer exposed: Secondary | ICD-10-CM | POA: Diagnosis not present

## 2016-02-24 DIAGNOSIS — I5032 Chronic diastolic (congestive) heart failure: Secondary | ICD-10-CM | POA: Diagnosis not present

## 2016-02-24 DIAGNOSIS — I110 Hypertensive heart disease with heart failure: Secondary | ICD-10-CM | POA: Diagnosis not present

## 2016-02-25 DIAGNOSIS — Z794 Long term (current) use of insulin: Secondary | ICD-10-CM | POA: Diagnosis not present

## 2016-02-25 DIAGNOSIS — Z4800 Encounter for change or removal of nonsurgical wound dressing: Secondary | ICD-10-CM | POA: Diagnosis not present

## 2016-02-25 DIAGNOSIS — I5032 Chronic diastolic (congestive) heart failure: Secondary | ICD-10-CM | POA: Diagnosis not present

## 2016-02-25 DIAGNOSIS — L97422 Non-pressure chronic ulcer of left heel and midfoot with fat layer exposed: Secondary | ICD-10-CM | POA: Diagnosis not present

## 2016-02-25 DIAGNOSIS — I110 Hypertensive heart disease with heart failure: Secondary | ICD-10-CM | POA: Diagnosis not present

## 2016-02-25 DIAGNOSIS — E11621 Type 2 diabetes mellitus with foot ulcer: Secondary | ICD-10-CM | POA: Diagnosis not present

## 2016-02-29 DIAGNOSIS — I110 Hypertensive heart disease with heart failure: Secondary | ICD-10-CM | POA: Diagnosis not present

## 2016-02-29 DIAGNOSIS — Z794 Long term (current) use of insulin: Secondary | ICD-10-CM | POA: Diagnosis not present

## 2016-02-29 DIAGNOSIS — L97422 Non-pressure chronic ulcer of left heel and midfoot with fat layer exposed: Secondary | ICD-10-CM | POA: Diagnosis not present

## 2016-02-29 DIAGNOSIS — I5032 Chronic diastolic (congestive) heart failure: Secondary | ICD-10-CM | POA: Diagnosis not present

## 2016-02-29 DIAGNOSIS — E11621 Type 2 diabetes mellitus with foot ulcer: Secondary | ICD-10-CM | POA: Diagnosis not present

## 2016-02-29 DIAGNOSIS — Z4800 Encounter for change or removal of nonsurgical wound dressing: Secondary | ICD-10-CM | POA: Diagnosis not present

## 2016-03-01 DIAGNOSIS — I110 Hypertensive heart disease with heart failure: Secondary | ICD-10-CM | POA: Diagnosis not present

## 2016-03-01 DIAGNOSIS — Z4800 Encounter for change or removal of nonsurgical wound dressing: Secondary | ICD-10-CM | POA: Diagnosis not present

## 2016-03-01 DIAGNOSIS — L97422 Non-pressure chronic ulcer of left heel and midfoot with fat layer exposed: Secondary | ICD-10-CM | POA: Diagnosis not present

## 2016-03-01 DIAGNOSIS — I5032 Chronic diastolic (congestive) heart failure: Secondary | ICD-10-CM | POA: Diagnosis not present

## 2016-03-01 DIAGNOSIS — Z794 Long term (current) use of insulin: Secondary | ICD-10-CM | POA: Diagnosis not present

## 2016-03-01 DIAGNOSIS — E11621 Type 2 diabetes mellitus with foot ulcer: Secondary | ICD-10-CM | POA: Diagnosis not present

## 2016-03-02 DIAGNOSIS — I110 Hypertensive heart disease with heart failure: Secondary | ICD-10-CM | POA: Diagnosis not present

## 2016-03-02 DIAGNOSIS — Z794 Long term (current) use of insulin: Secondary | ICD-10-CM | POA: Diagnosis not present

## 2016-03-02 DIAGNOSIS — L97422 Non-pressure chronic ulcer of left heel and midfoot with fat layer exposed: Secondary | ICD-10-CM | POA: Diagnosis not present

## 2016-03-02 DIAGNOSIS — E11621 Type 2 diabetes mellitus with foot ulcer: Secondary | ICD-10-CM | POA: Diagnosis not present

## 2016-03-02 DIAGNOSIS — I5032 Chronic diastolic (congestive) heart failure: Secondary | ICD-10-CM | POA: Diagnosis not present

## 2016-03-02 DIAGNOSIS — Z4800 Encounter for change or removal of nonsurgical wound dressing: Secondary | ICD-10-CM | POA: Diagnosis not present

## 2016-03-03 DIAGNOSIS — L97422 Non-pressure chronic ulcer of left heel and midfoot with fat layer exposed: Secondary | ICD-10-CM | POA: Diagnosis not present

## 2016-03-03 DIAGNOSIS — Z4800 Encounter for change or removal of nonsurgical wound dressing: Secondary | ICD-10-CM | POA: Diagnosis not present

## 2016-03-03 DIAGNOSIS — Z794 Long term (current) use of insulin: Secondary | ICD-10-CM | POA: Diagnosis not present

## 2016-03-03 DIAGNOSIS — I5032 Chronic diastolic (congestive) heart failure: Secondary | ICD-10-CM | POA: Diagnosis not present

## 2016-03-03 DIAGNOSIS — I110 Hypertensive heart disease with heart failure: Secondary | ICD-10-CM | POA: Diagnosis not present

## 2016-03-03 DIAGNOSIS — E11621 Type 2 diabetes mellitus with foot ulcer: Secondary | ICD-10-CM | POA: Diagnosis not present

## 2016-03-08 DIAGNOSIS — E539 Vitamin B deficiency, unspecified: Secondary | ICD-10-CM | POA: Diagnosis not present

## 2016-03-08 DIAGNOSIS — Z79899 Other long term (current) drug therapy: Secondary | ICD-10-CM | POA: Diagnosis not present

## 2016-03-08 DIAGNOSIS — G4089 Other seizures: Secondary | ICD-10-CM | POA: Diagnosis not present

## 2016-03-21 DIAGNOSIS — E785 Hyperlipidemia, unspecified: Secondary | ICD-10-CM | POA: Diagnosis not present

## 2016-03-21 DIAGNOSIS — E119 Type 2 diabetes mellitus without complications: Secondary | ICD-10-CM | POA: Diagnosis not present

## 2016-03-21 DIAGNOSIS — Z79899 Other long term (current) drug therapy: Secondary | ICD-10-CM | POA: Diagnosis not present

## 2016-03-23 DIAGNOSIS — E1140 Type 2 diabetes mellitus with diabetic neuropathy, unspecified: Secondary | ICD-10-CM | POA: Diagnosis not present

## 2016-03-23 DIAGNOSIS — Z6831 Body mass index (BMI) 31.0-31.9, adult: Secondary | ICD-10-CM | POA: Diagnosis not present

## 2016-03-23 DIAGNOSIS — N08 Glomerular disorders in diseases classified elsewhere: Secondary | ICD-10-CM | POA: Diagnosis not present

## 2016-03-23 DIAGNOSIS — I10 Essential (primary) hypertension: Secondary | ICD-10-CM | POA: Diagnosis not present

## 2016-03-27 DIAGNOSIS — E1122 Type 2 diabetes mellitus with diabetic chronic kidney disease: Secondary | ICD-10-CM | POA: Diagnosis not present

## 2016-03-27 DIAGNOSIS — G4089 Other seizures: Secondary | ICD-10-CM | POA: Diagnosis not present

## 2016-03-27 DIAGNOSIS — N183 Chronic kidney disease, stage 3 (moderate): Secondary | ICD-10-CM | POA: Diagnosis not present

## 2016-03-27 DIAGNOSIS — I10 Essential (primary) hypertension: Secondary | ICD-10-CM | POA: Diagnosis not present

## 2016-04-03 DIAGNOSIS — H04123 Dry eye syndrome of bilateral lacrimal glands: Secondary | ICD-10-CM | POA: Diagnosis not present

## 2016-04-03 DIAGNOSIS — E119 Type 2 diabetes mellitus without complications: Secondary | ICD-10-CM | POA: Diagnosis not present

## 2016-04-14 DIAGNOSIS — E1140 Type 2 diabetes mellitus with diabetic neuropathy, unspecified: Secondary | ICD-10-CM | POA: Diagnosis not present

## 2016-05-02 DIAGNOSIS — R569 Unspecified convulsions: Secondary | ICD-10-CM | POA: Diagnosis not present

## 2016-05-02 DIAGNOSIS — R269 Unspecified abnormalities of gait and mobility: Secondary | ICD-10-CM | POA: Diagnosis not present

## 2016-05-02 DIAGNOSIS — E1140 Type 2 diabetes mellitus with diabetic neuropathy, unspecified: Secondary | ICD-10-CM | POA: Diagnosis not present

## 2016-05-02 DIAGNOSIS — G5602 Carpal tunnel syndrome, left upper limb: Secondary | ICD-10-CM | POA: Diagnosis not present

## 2016-05-02 DIAGNOSIS — G5622 Lesion of ulnar nerve, left upper limb: Secondary | ICD-10-CM | POA: Diagnosis not present

## 2016-05-02 DIAGNOSIS — G3184 Mild cognitive impairment, so stated: Secondary | ICD-10-CM | POA: Diagnosis not present

## 2016-05-10 DIAGNOSIS — Z6832 Body mass index (BMI) 32.0-32.9, adult: Secondary | ICD-10-CM | POA: Diagnosis not present

## 2016-05-10 DIAGNOSIS — N08 Glomerular disorders in diseases classified elsewhere: Secondary | ICD-10-CM | POA: Diagnosis not present

## 2016-05-10 DIAGNOSIS — I10 Essential (primary) hypertension: Secondary | ICD-10-CM | POA: Diagnosis not present

## 2016-05-10 DIAGNOSIS — E1140 Type 2 diabetes mellitus with diabetic neuropathy, unspecified: Secondary | ICD-10-CM | POA: Diagnosis not present

## 2016-05-15 DIAGNOSIS — E784 Other hyperlipidemia: Secondary | ICD-10-CM | POA: Diagnosis not present

## 2016-05-15 DIAGNOSIS — I10 Essential (primary) hypertension: Secondary | ICD-10-CM | POA: Diagnosis not present

## 2016-05-15 DIAGNOSIS — N08 Glomerular disorders in diseases classified elsewhere: Secondary | ICD-10-CM | POA: Diagnosis not present

## 2016-05-15 DIAGNOSIS — I5033 Acute on chronic diastolic (congestive) heart failure: Secondary | ICD-10-CM | POA: Diagnosis not present

## 2016-05-15 DIAGNOSIS — E042 Nontoxic multinodular goiter: Secondary | ICD-10-CM | POA: Diagnosis not present

## 2016-05-15 DIAGNOSIS — E1142 Type 2 diabetes mellitus with diabetic polyneuropathy: Secondary | ICD-10-CM | POA: Diagnosis not present

## 2016-05-15 DIAGNOSIS — K222 Esophageal obstruction: Secondary | ICD-10-CM | POA: Diagnosis not present

## 2016-05-15 DIAGNOSIS — F329 Major depressive disorder, single episode, unspecified: Secondary | ICD-10-CM | POA: Diagnosis not present

## 2016-05-15 DIAGNOSIS — Z6833 Body mass index (BMI) 33.0-33.9, adult: Secondary | ICD-10-CM | POA: Diagnosis not present

## 2016-05-25 DIAGNOSIS — R569 Unspecified convulsions: Secondary | ICD-10-CM | POA: Diagnosis not present

## 2016-05-31 DIAGNOSIS — R269 Unspecified abnormalities of gait and mobility: Secondary | ICD-10-CM | POA: Diagnosis not present

## 2016-05-31 DIAGNOSIS — G40409 Other generalized epilepsy and epileptic syndromes, not intractable, without status epilepticus: Secondary | ICD-10-CM | POA: Diagnosis not present

## 2016-05-31 DIAGNOSIS — G5602 Carpal tunnel syndrome, left upper limb: Secondary | ICD-10-CM | POA: Diagnosis not present

## 2016-05-31 DIAGNOSIS — H8111 Benign paroxysmal vertigo, right ear: Secondary | ICD-10-CM | POA: Diagnosis not present

## 2016-05-31 DIAGNOSIS — E1140 Type 2 diabetes mellitus with diabetic neuropathy, unspecified: Secondary | ICD-10-CM | POA: Diagnosis not present

## 2016-05-31 DIAGNOSIS — G5622 Lesion of ulnar nerve, left upper limb: Secondary | ICD-10-CM | POA: Diagnosis not present

## 2016-07-10 DIAGNOSIS — I5032 Chronic diastolic (congestive) heart failure: Secondary | ICD-10-CM | POA: Diagnosis not present

## 2016-07-10 DIAGNOSIS — N183 Chronic kidney disease, stage 3 (moderate): Secondary | ICD-10-CM | POA: Diagnosis not present

## 2016-07-10 DIAGNOSIS — E1122 Type 2 diabetes mellitus with diabetic chronic kidney disease: Secondary | ICD-10-CM | POA: Diagnosis not present

## 2016-08-11 ENCOUNTER — Ambulatory Visit (INDEPENDENT_AMBULATORY_CARE_PROVIDER_SITE_OTHER): Payer: Medicare Other | Admitting: Cardiovascular Disease

## 2016-08-11 ENCOUNTER — Encounter: Payer: Self-pay | Admitting: Cardiovascular Disease

## 2016-08-11 VITALS — BP 164/66 | HR 74 | Ht 68.5 in | Wt 218.0 lb

## 2016-08-11 DIAGNOSIS — I10 Essential (primary) hypertension: Secondary | ICD-10-CM | POA: Diagnosis not present

## 2016-08-11 DIAGNOSIS — I5032 Chronic diastolic (congestive) heart failure: Secondary | ICD-10-CM | POA: Diagnosis not present

## 2016-08-11 DIAGNOSIS — I340 Nonrheumatic mitral (valve) insufficiency: Secondary | ICD-10-CM

## 2016-08-11 NOTE — Progress Notes (Signed)
Chief Complaint  Patient presents with  . Follow-up    BP issues     History of Present Illness: 71 yo female with history of DM, HTN, HLD, hypothyroidism, GERD, anemia here today for cardiac follow up. I saw her as a new patient 04/30/13  for evaluation of dyspnea and weight gain, chest pain and cough. She had a stress test in 2000 that showed no ischemia. She had a cardiac cath in 2000 and was told it was normal. Chronic right lower ext edema from orthopedic injury. Echo 05/15/13 with moderate LVH, normal LV function, grade 2 diastolic dysfunction, mild MR. Lexiscan stress myoview 05/22/13 with no ischemia. She was seen in our office by Cecilie Kicks, NP in November 2016 for chest pain, dyspnea. D-dimer was elevated which lead to a CTA of the chest. This showed no PE but there were bilateral pleural effusions. She was then seen by Dr. Elsworth Soho in the pulmonary office and underwent thoracentesis on 06/04/15. She was started on Lasix and Norvasc was stopped. Echo 06/1915 with normal LV systolic function, elevated filling pressures and mild MR. With higher dose of lasix, her LE edema improved and her weight was down. Admitted to [facility] 08/13/15 after syncopal episode. Chest xray with no pulmonary edema or pleural effusions. She has been on torsemide 20 mg BID.    She is here today for follow up. No SOB. No chest pain or LE edema. She broke her heel last year and had several surgeries for this. She also walked into her bathroom door and cut her forehead.   Primary Care Physician: Asencion Noble, MD  Endocrine: Forde Dandy   Past Medical History:  Diagnosis Date  . 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 Arthritis   . Bronchitis   . CHF (congestive heart failure) ([place])   . Chronic diastolic heart failure ([place]) 07/13/2015  . Diabetes mellitus   . DKA (diabetic ketoacidoses) ([place]) 09/27/2014  . GERD (gastroesophageal reflux disease)   . Headache   . Hyperlipidemia   . Hypertension   . Hypothyroid  10/31/2014  . hypothyroidism   . Pneumonia    hx of   . PONV (postoperative nausea and vomiting)    and slow to wake up after foot surgery   . Shingles   . Syncope 10/31/2014  . Vertigo 10/31/2014    Past Surgical History:  Procedure Laterality Date  . ANKLE SURGERY     x 4  . APPENDECTOMY    . CATARACT EXTRACTION Bilateral   . CHOLECYSTECTOMY    . HARDWARE REMOVAL Left 11/03/2015   Procedure: HARDWARE REMOVAL LEFT FOOT appliction of wound vac;  Surgeon: Gaynelle Arabian, MD;  Location: [facility];  Service: Orthopedics;  Laterality: Left;  . INCISION AND DRAINAGE OF WOUND Left 11/03/2015   Procedure: IRRIGATION AND DEBRIDEMENT LEFT FOOT ULCER;  Surgeon: Gaynelle Arabian, MD;  Location: [facility];  Service: Orthopedics;  Laterality: Left;  . KNEE ARTHROSCOPY    . NASAL SINUS SURGERY    . non cancerous mass removed     small intestine  . ORIF CALCANEOUS FRACTURE Left 12/29/2014   Procedure: OPEN REDUCTION INTERNAL FIXATION  LEFT CALCANEOUS FRACTURE;  Surgeon: Gaynelle Arabian, MD;  Location: [facility];  Service: Orthopedics;  Laterality: Left;  . TONSILLECTOMY      Current Outpatient Prescriptions  Medication Sig Dispense Refill  . acetaminophen (TYLENOL) 500 MG tablet Take 500-1,000 mg by mouth every 6 (six) hours as needed (For pain.).     Marland Kitchen  Ascorbic Acid (VITAMIN C PO) Take 2 tablets by mouth daily.    . carvedilol (COREG) 6.25 MG tablet Take 6.25 mg by mouth 2 (two) times daily.    Marland Kitchen esomeprazole (NEXIUM) 20 MG capsule Take 20 mg by mouth daily.     . fluticasone (FLONASE) 50 MCG/ACT nasal spray Place 2 sprays into the nose 2 (two) times daily as needed for allergies.     Marland Kitchen HUMULIN R 500 UNIT/ML injection Inject 14 units into the skin twice daily. Inject 9 units into the skin daily at bedtime.    . levETIRAcetam (KEPPRA) 500 MG tablet Take 1 tablet (500 mg total) by mouth 2 (two) times daily. 60 tablet 3  . levothyroxine (SYNTHROID, LEVOTHROID) 125 MCG tablet Take 125 mcg by mouth 2 (two) times  daily.    . ondansetron (ZOFRAN) 4 MG tablet Take 1 tablet (4 mg total) by mouth every 6 (six) hours as needed for nausea. 40 tablet 0  . PARoxetine (PAXIL) 40 MG tablet Take 40 mg by mouth daily.    . Polyethylene Glycol 400 (BLINK TEARS OP) Place 1 drop into both eyes 4 (four) times daily as needed (Dry eyes).     . potassium chloride (K-DUR) 10 MEQ tablet Take 1 tablet (10 mEq total) by mouth daily. 90 tablet 3  . quinapril (ACCUPRIL) 40 MG tablet Take 40 mg by mouth daily.     . Senna-Psyllium (PERDIEM PO) Take 1 tablet by mouth daily as needed (For constipation.).     Marland Kitchen simvastatin (ZOCOR) 20 MG tablet Take 20 mg by mouth at bedtime.     . torsemide (DEMADEX) 20 MG tablet Take 20 mg by mouth 2 (two) times daily.    . vitamin B-12 (CYANOCOBALAMIN) 1000 MCG tablet Take 1,000 mcg by mouth daily.     No current facility-administered medications for this visit.     Allergies  Allergen Reactions  . Sulfonamide Derivatives Anaphylaxis  . Bactrim [Sulfamethoxazole-Trimethoprim] Nausea Only  . Erythromycin Nausea And Vomiting  . Latex Itching and Other (See Comments)    If she wears gloves too long her hands start itching  . Mucinex [Guaifenesin Er] Nausea And Vomiting  . Aspirin Rash  . Levofloxacin Rash  . Tape Rash    Social History   Social History  . Marital status: Widowed    Spouse name: N/A  . Number of children: N/A  . Years of education: N/A   Occupational History  . Retired-[employer]    Social History Main Topics  . Smoking status: Never Smoker  . Smokeless tobacco: Never Used  . Alcohol use No  . Drug use: No  . Sexual activity: Not Currently   Other Topics Concern  . Not on file   Social History Narrative  . No narrative on file    Family History  Problem Relation Age of Onset  . Diabetes Mother   . Asthma Mother   . Hypertension Father   . Heart disease Father   . Diabetes Father   . Heart disease Sister   . Hypertension Sister   . Asthma Sister   .  Diabetes Brother   . CAD Brother   . Stroke Brother     Review of Systems:  As stated in the HPI and otherwise negative.   BP (!) 164/66 (BP Location: Left Arm)   Pulse 74   Ht 5' 8.5" (1.74 m)   Wt 218 lb (98.9 kg)   BMI 32.66 kg/m   Physical  Examination: General: Well developed, well nourished, NAD  HEENT: OP clear, mucus membranes moist  SKIN: warm, dry. No rashes. Neuro: No focal deficits  Musculoskeletal: Muscle strength 5/5 all ext  Psychiatric: Mood and affect normal  Neck: No JVD, no carotid bruits, no thyromegaly, no lymphadenopathy.  Lungs:Clear bilaterally, no wheezes, rhonci, crackles Cardiovascular: Regular rate and rhythm. No murmurs, gallops or rubs. Abdomen:Soft. Bowel sounds present. Non-tender.  Extremities: Trace bilateral lower extremity edema.   Echo 07/01/15: Left ventricle: The cavity size was normal. Wall thickness was increased in a pattern of mild LVH. Systolic function was normal. The estimated ejection fraction was in the range of 60% to 65%. Doppler parameters are consistent with elevated ventricular end-diastolic filling pressure. - Mitral valve: Calcified annulus. Mildly thickened leaflets . There was mild to moderate regurgitation. - Left atrium: The atrium was moderately dilated. - Atrial septum: No defect or patent foramen ovale was identified.  Lexiscan stress myoview 05/22/13: Stress Procedure: The patient received IV Lexiscan 0.4 mg over 15-seconds. Technetium 15m Sestamibi injected at 30-seconds. This patient had sob with the Lexiscan injection. Quantitative spect images were obtained after a 45 minute delay.  Stress ECG: No significant change from baseline ECG  QPS  Raw Data Images: Normal; no motion artifact; normal heart/lung ratio.  Stress Images: Normal homogeneous uptake in all areas of the myocardium.  Rest Images: Normal homogeneous uptake in all areas of the myocardium.  Subtraction (SDS): Normal  Transient Ischemic  Dilatation (Normal <1.22): 1.06  Lung/Heart Ratio (Normal <0.45): 0.40  Quantitative Gated Spect Images  QGS EDV: 92 ml  QGS ESV: 28 ml  Impression  Exercise Capacity: Lexiscan with no exercise.  BP Response: Normal blood pressure response.  Clinical Symptoms: There is dyspnea.  ECG Impression: No significant ST segment change suggestive of ischemia.  Comparison with Prior Nuclear Study: No images to compare  Overall Impression: Normal stress nuclear study.  LV Ejection Fraction: 70%. LV Wall Motion: NL LV Function; NL Wall Motion   EKG:  EKG is not ordered today. The ekg ordered today demonstrates   Recent Labs: 01/17/2016: Magnesium 2.3; TSH 1.655 01/18/2016: ALT 9; Hemoglobin 9.7; Platelets 148 01/20/2016: BUN 23; Creatinine, Ser 0.86; Potassium 4.2; Sodium 137   Lipid Panel No results found for: CHOL, TRIG, HDL, CHOLHDL, VLDL, LDLCALC, LDLDIRECT   Wt Readings from Last 3 Encounters:  08/11/16 218 lb (98.9 kg)  01/20/16 202 lb 3.2 oz (91.7 kg)  11/03/15 190 lb (86.2 kg)     Other studies Reviewed: Additional studies/ records that were reviewed today include: . Review of the above records demonstrates:    Assessment and Plan:   1. Acute on chronic diastolic CHF: Her weight is stable. LE edema is improved. Will continue torsemide 20 mg BID. Continue potassium.   2. Chest pain: Resolved. She is known to have normal coronary arteries. Stress test 2014 with no ischemia.    3. Mitral regurgitation: Mild by echo 2017  4. HTN: BP has been elevated. Dr. Willey Blade is adjusting meds. She is now on Coreg and Accupril. No changes today.   Current medicines are reviewed at length with the patient today.  The patient does not have concerns regarding medicines.  The following changes have been made:  no change  Labs/ tests ordered today include:   No orders of the defined types were placed in this encounter.   Disposition:   FU with me in 12  months  Signed, Lauree Chandler,  MD 08/11/2016 3:50 PM  [facility] [address], [place], [place]  [address] Phone: [phone]; Fax: [phone]

## 2016-08-11 NOTE — Patient Instructions (Signed)

## 2016-09-25 DIAGNOSIS — L97429 Non-pressure chronic ulcer of left heel and midfoot with unspecified severity: Secondary | ICD-10-CM | POA: Diagnosis not present

## 2016-09-25 DIAGNOSIS — E1140 Type 2 diabetes mellitus with diabetic neuropathy, unspecified: Secondary | ICD-10-CM | POA: Diagnosis not present

## 2016-09-25 DIAGNOSIS — I5033 Acute on chronic diastolic (congestive) heart failure: Secondary | ICD-10-CM | POA: Diagnosis not present

## 2016-09-25 DIAGNOSIS — N08 Glomerular disorders in diseases classified elsewhere: Secondary | ICD-10-CM | POA: Diagnosis not present

## 2016-09-25 DIAGNOSIS — D510 Vitamin B12 deficiency anemia due to intrinsic factor deficiency: Secondary | ICD-10-CM | POA: Diagnosis not present

## 2016-09-26 DIAGNOSIS — I5032 Chronic diastolic (congestive) heart failure: Secondary | ICD-10-CM | POA: Diagnosis not present

## 2016-09-26 DIAGNOSIS — E119 Type 2 diabetes mellitus without complications: Secondary | ICD-10-CM | POA: Diagnosis not present

## 2016-09-26 DIAGNOSIS — I10 Essential (primary) hypertension: Secondary | ICD-10-CM | POA: Diagnosis not present

## 2016-09-26 DIAGNOSIS — Z79899 Other long term (current) drug therapy: Secondary | ICD-10-CM | POA: Diagnosis not present

## 2016-10-02 DIAGNOSIS — G3184 Mild cognitive impairment, so stated: Secondary | ICD-10-CM | POA: Diagnosis not present

## 2016-10-02 DIAGNOSIS — R269 Unspecified abnormalities of gait and mobility: Secondary | ICD-10-CM | POA: Diagnosis not present

## 2016-10-02 DIAGNOSIS — I5032 Chronic diastolic (congestive) heart failure: Secondary | ICD-10-CM | POA: Diagnosis not present

## 2016-10-02 DIAGNOSIS — E785 Hyperlipidemia, unspecified: Secondary | ICD-10-CM | POA: Diagnosis not present

## 2016-10-02 DIAGNOSIS — E1140 Type 2 diabetes mellitus with diabetic neuropathy, unspecified: Secondary | ICD-10-CM | POA: Diagnosis not present

## 2016-10-02 DIAGNOSIS — N183 Chronic kidney disease, stage 3 (moderate): Secondary | ICD-10-CM | POA: Diagnosis not present

## 2016-10-02 DIAGNOSIS — H8111 Benign paroxysmal vertigo, right ear: Secondary | ICD-10-CM | POA: Diagnosis not present

## 2016-10-03 ENCOUNTER — Other Ambulatory Visit: Payer: Self-pay | Admitting: Cardiovascular Disease

## 2016-10-03 ENCOUNTER — Encounter: Payer: Self-pay | Admitting: Internal Medicine

## 2016-10-23 DIAGNOSIS — M25552 Pain in left hip: Secondary | ICD-10-CM | POA: Diagnosis not present

## 2016-10-27 DIAGNOSIS — M109 Gout, unspecified: Secondary | ICD-10-CM | POA: Diagnosis not present

## 2016-11-14 ENCOUNTER — Other Ambulatory Visit (INDEPENDENT_AMBULATORY_CARE_PROVIDER_SITE_OTHER): Payer: Self-pay | Admitting: Otolaryngology

## 2016-11-14 DIAGNOSIS — E041 Nontoxic single thyroid nodule: Secondary | ICD-10-CM

## 2016-11-15 ENCOUNTER — Ambulatory Visit (INDEPENDENT_AMBULATORY_CARE_PROVIDER_SITE_OTHER): Payer: Medicare Other | Admitting: Internal Medicine

## 2016-11-15 ENCOUNTER — Encounter: Payer: Self-pay | Admitting: Internal Medicine

## 2016-11-15 VITALS — BP 152/78 | Ht 68.0 in | Wt 213.0 lb

## 2016-11-15 DIAGNOSIS — R1319 Other dysphagia: Secondary | ICD-10-CM

## 2016-11-15 DIAGNOSIS — Z794 Long term (current) use of insulin: Secondary | ICD-10-CM | POA: Diagnosis not present

## 2016-11-15 DIAGNOSIS — Z86010 Personal history of colonic polyps: Secondary | ICD-10-CM

## 2016-11-15 DIAGNOSIS — E1165 Type 2 diabetes mellitus with hyperglycemia: Secondary | ICD-10-CM

## 2016-11-15 DIAGNOSIS — IMO0002 Reserved for concepts with insufficient information to code with codable children: Secondary | ICD-10-CM

## 2016-11-15 DIAGNOSIS — R1310 Dysphagia, unspecified: Secondary | ICD-10-CM | POA: Diagnosis not present

## 2016-11-15 NOTE — Patient Instructions (Addendum)
You have been scheduled for an endoscopy and colonoscopy. Please follow the written instructions given to you at your visit today. Please pick up your prep supplies at the pharmacy. If you use inhalers (even only as needed), please bring them with you on the day of your procedure.   I appreciate the opportunity to care for you. Carl Gessner, MD, FACG 

## 2016-11-15 NOTE — Progress Notes (Signed)
Jaclyn Herrera 71 y.o. 03-22-1946 [id number]  Assessment & Plan:   Encounter Diagnoses  Name Primary?  . Esophageal dysphagia Yes  . Hx of colonic polyps   . Uncontrolled type 2 diabetes mellitus with insulin therapy ([place])    Will schedule upper endoscopy with likely esophageal dilation since that helped in the past. There was no discrete stricture but empiric dilation did help so anticipate repeating.  Schedule surveillance colonoscopy.  The risks and benefits as well as alternatives of endoscopic procedure(s) have been discussed and reviewed. All questions answered. The patient agrees to proceed.   Will modify insulin regimen due to fasting for colonoscopy.  I appreciate the opportunity to care for this patient. CC: Asencion Noble, MD    Subjective:   Chief Complaint: Dysphagia, history of colon polyps  HPI The patient is a very nice widowed 71 year old white woman here with a complaint of recurrent dysphagia, solids more than liquids. She has a suprasternal sticking point and says she frequently gets strangled. It does not sound like she has symptoms of aspiration. It is similar to what I treated her for with esophageal dilation, and she said she had a wonderful response for many years. She does not complain of significant heartburn problems at this time. He also has a history of adenomatous colon polyps both here in [address] and previously in [place]. She has received a recall letter but had not yet scheduled a colonoscopy. Last year she had removal of left ankle fracture hardware and treatment of the wound. She also had a seizure in August 2017 and problems with poorly controlled diabetes.  She has undergone a tough time lately having to put her older demented sister in the nursing home and the sister pro-tested this and has made it difficult. Allergies  Allergen Reactions  . Sulfonamide Derivatives Anaphylaxis  . Bactrim [Sulfamethoxazole-Trimethoprim] Nausea Only  .  Erythromycin Nausea And Vomiting  . Latex Itching and Other (See Comments)    If she wears gloves too long her hands start itching  . Mucinex [Guaifenesin Er] Nausea And Vomiting  . Aspirin Rash  . Levofloxacin Rash  . Tape Rash   Current Meds  Medication Sig  . acetaminophen (TYLENOL) 500 MG tablet Take 500-1,000 mg by mouth every 6 (six) hours as needed (For pain.).   Marland Kitchen Ascorbic Acid (VITAMIN C PO) Take 2 tablets by mouth daily.  . carvedilol (COREG) 6.25 MG tablet Take 6.25 mg by mouth 2 (two) times daily.  Marland Kitchen esomeprazole (NEXIUM) 20 MG capsule Take 20 mg by mouth daily.   . fluticasone (FLONASE) 50 MCG/ACT nasal spray Place 2 sprays into the nose 2 (two) times daily as needed for allergies.   Marland Kitchen HUMULIN R 500 UNIT/ML injection Inject 14 units into the skin twice daily. Inject 9 units into the skin daily at bedtime.  . levETIRAcetam (KEPPRA) 500 MG tablet Take 1 tablet (500 mg total) by mouth 2 (two) times daily.  Marland Kitchen levothyroxine (SYNTHROID, LEVOTHROID) 125 MCG tablet Take 125 mcg by mouth 2 (two) times daily.  Marland Kitchen PARoxetine (PAXIL) 40 MG tablet Take 40 mg by mouth daily.  . Polyethylene Glycol 400 (BLINK TEARS OP) Place 1 drop into both eyes 4 (four) times daily as needed (Dry eyes).   . potassium chloride (K-DUR) 10 MEQ tablet TAKE ONE TABLET BY MOUTH ONCE DAILY  . quinapril (ACCUPRIL) 40 MG tablet Take 40 mg by mouth daily.   . Senna-Psyllium (PERDIEM PO) Take 1 tablet by mouth daily as  needed (For constipation.).   Marland Kitchen simvastatin (ZOCOR) 20 MG tablet Take 20 mg by mouth at bedtime.   . torsemide (DEMADEX) 20 MG tablet Take 20 mg by mouth 2 (two) times daily.  . vitamin B-12 (CYANOCOBALAMIN) 1000 MCG tablet Take 1,000 mcg by mouth daily.  . [DISCONTINUED] ondansetron (ZOFRAN) 4 MG tablet Take 1 tablet (4 mg total) by mouth every 6 (six) hours as needed for nausea.   Past Medical History:  Diagnosis Date  . 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 Arthritis   .  Bronchitis   . CHF (congestive heart failure) ([place])   . Chronic diastolic heart failure ([place]) 07/13/2015  . Diabetes mellitus   . DKA (diabetic ketoacidoses) ([place]) 09/27/2014  . GERD (gastroesophageal reflux disease)   . Headache   . Hyperlipidemia   . Hypertension   . Hypothyroid 10/31/2014  . hypothyroidism   . Pneumonia    hx of   . PONV (postoperative nausea and vomiting)    and slow to wake up after foot surgery   . Shingles   . Syncope 10/31/2014  . Tubular adenoma of colon   . Vertigo 10/31/2014   Past Surgical History:  Procedure Laterality Date  . ANKLE SURGERY     x 4  . APPENDECTOMY    . CATARACT EXTRACTION Bilateral   . CHOLECYSTECTOMY    . COLONOSCOPY W/ BIOPSIES AND POLYPECTOMY    . ESOPHAGOGASTRODUODENOSCOPY    . HARDWARE REMOVAL Left 11/03/2015   Procedure: HARDWARE REMOVAL LEFT FOOT appliction of wound vac;  Surgeon: Gaynelle Arabian, MD;  [facility];  Service: Orthopedics;  Laterality: Left;  . INCISION AND DRAINAGE OF WOUND Left 11/03/2015   Procedure: IRRIGATION AND DEBRIDEMENT LEFT FOOT ULCER;  Surgeon: Gaynelle Arabian, MD;  [facility];  Service: Orthopedics;  Laterality: Left;  . KNEE ARTHROSCOPY    . NASAL SINUS SURGERY    . non cancerous mass removed     small intestine  . ORIF CALCANEOUS FRACTURE Left 12/29/2014   Procedure: OPEN REDUCTION INTERNAL FIXATION  LEFT CALCANEOUS FRACTURE;  Surgeon: Gaynelle Arabian, MD;  [facility];  Service: Orthopedics;  Laterality: Left;  . TONSILLECTOMY     Social History   Social History  . Marital status: Widowed    Spouse name: N/A  . Number of children: N/A  . Years of education: N/A   Occupational History  . Retired-[employer]    Social History Main Topics  . Smoking status: Never Smoker  . Smokeless tobacco: Never Used  . Alcohol use No  . Drug use: No  . Sexual activity: Not Currently   Other Topics Concern  . None   Social History Narrative   The patient is widowed. She is retired from [employer].  No children.   One or 2 caffeinated beverages daily   She has split time between [place] in the winter and [place] in the [place]   family history includes Asthma in her mother and sister; CAD in her brother; Diabetes in her brother, father, and mother; Heart disease in her father and sister; Hypertension in her father and sister; Stroke in her brother.  Review of Systems As per history of present illness and also seasonal allergy symptoms depressed mood. Pedal edema on the right.  Objective:   Physical Exam @BP  (!) 152/78 (BP Location: Right Arm, Patient Position: Sitting, Cuff Size: Normal)   Ht 5\' 8"  (1.727 m)   Wt 213 lb (96.6  kg)   BMI 32.39 kg/m @  General:  Well-developed, well-nourished and in no acute distress Eyes:  anicteric. ENT:   Mouth and posterior pharynx free of lesions.  Neck:   supple w/o thyromegaly or mass.  Lungs: Clear to auscultation bilaterally. Heart:  S1S2, no rubs, murmurs, gallops. Abdomen:  soft, non-tender, no hepatosplenomegaly, hernia, or mass and BS+.  Rectal: Deferred until colonoscopy Lymph:  no cervical or supraclavicular adenopathy. Extremities:  2+ right lower extremity edema, cyanosis or clubbing Skin  Brown hyperpigmentation changes in the right pretibial area was Neuro:  A&O x 3.  Psych:  appropriate mood and  Affect.   Data Reviewed: See above. Previous colonoscopy EGD reports. Hospital records from 2017 reviewed. Labs in the EMR.

## 2016-11-21 ENCOUNTER — Ambulatory Visit (HOSPITAL_COMMUNITY): Admission: RE | Admit: 2016-11-21 | Payer: Medicare Other | Source: Ambulatory Visit

## 2016-11-23 ENCOUNTER — Ambulatory Visit (HOSPITAL_COMMUNITY)
Admission: RE | Admit: 2016-11-23 | Discharge: 2016-11-23 | Disposition: A | Discharge status: Home / Self Care | Payer: Medicare Other | Source: Ambulatory Visit | Attending: Otolaryngology | Admitting: Otolaryngology

## 2016-11-23 DIAGNOSIS — E042 Nontoxic multinodular goiter: Secondary | ICD-10-CM | POA: Insufficient documentation

## 2016-11-23 DIAGNOSIS — E041 Nontoxic single thyroid nodule: Secondary | ICD-10-CM | POA: Diagnosis not present

## 2016-11-30 ENCOUNTER — Ambulatory Visit (INDEPENDENT_AMBULATORY_CARE_PROVIDER_SITE_OTHER): Payer: Medicare Other | Admitting: Otolaryngology

## 2016-11-30 DIAGNOSIS — H608X3 Other otitis externa, bilateral: Secondary | ICD-10-CM

## 2016-11-30 DIAGNOSIS — K122 Cellulitis and abscess of mouth: Secondary | ICD-10-CM | POA: Diagnosis not present

## 2016-11-30 DIAGNOSIS — D440 Neoplasm of uncertain behavior of thyroid gland: Secondary | ICD-10-CM | POA: Diagnosis not present

## 2016-12-04 DIAGNOSIS — M79671 Pain in right foot: Secondary | ICD-10-CM | POA: Diagnosis not present

## 2016-12-04 DIAGNOSIS — S9031XA Contusion of right foot, initial encounter: Secondary | ICD-10-CM | POA: Diagnosis not present

## 2016-12-20 DIAGNOSIS — M79604 Pain in right leg: Secondary | ICD-10-CM | POA: Diagnosis not present

## 2016-12-20 DIAGNOSIS — M7989 Other specified soft tissue disorders: Secondary | ICD-10-CM | POA: Diagnosis not present

## 2016-12-21 ENCOUNTER — Other Ambulatory Visit (HOSPITAL_COMMUNITY): Payer: Self-pay | Admitting: Orthopedic Surgery

## 2016-12-21 DIAGNOSIS — M79604 Pain in right leg: Secondary | ICD-10-CM

## 2016-12-22 ENCOUNTER — Ambulatory Visit (HOSPITAL_COMMUNITY)
Admission: RE | Admit: 2016-12-22 | Discharge: 2016-12-22 | Disposition: A | Discharge status: Home / Self Care | Payer: Medicare Other | Source: Ambulatory Visit | Attending: Orthopedic Surgery | Admitting: Orthopedic Surgery

## 2016-12-22 DIAGNOSIS — M79604 Pain in right leg: Secondary | ICD-10-CM

## 2016-12-22 DIAGNOSIS — I8001 Phlebitis and thrombophlebitis of superficial vessels of right lower extremity: Secondary | ICD-10-CM | POA: Diagnosis not present

## 2016-12-22 DIAGNOSIS — R600 Localized edema: Secondary | ICD-10-CM | POA: Diagnosis not present

## 2016-12-25 DIAGNOSIS — M79604 Pain in right leg: Secondary | ICD-10-CM | POA: Diagnosis not present

## 2016-12-25 DIAGNOSIS — M7989 Other specified soft tissue disorders: Secondary | ICD-10-CM | POA: Diagnosis not present

## 2016-12-25 DIAGNOSIS — I8001 Phlebitis and thrombophlebitis of superficial vessels of right lower extremity: Secondary | ICD-10-CM | POA: Diagnosis not present

## 2016-12-28 DIAGNOSIS — E785 Hyperlipidemia, unspecified: Secondary | ICD-10-CM | POA: Diagnosis not present

## 2016-12-28 DIAGNOSIS — E119 Type 2 diabetes mellitus without complications: Secondary | ICD-10-CM | POA: Diagnosis not present

## 2016-12-28 DIAGNOSIS — Z79899 Other long term (current) drug therapy: Secondary | ICD-10-CM | POA: Diagnosis not present

## 2017-01-01 ENCOUNTER — Encounter: Payer: Self-pay | Admitting: Internal Medicine

## 2017-01-01 DIAGNOSIS — I5032 Chronic diastolic (congestive) heart failure: Secondary | ICD-10-CM | POA: Diagnosis not present

## 2017-01-01 DIAGNOSIS — N183 Chronic kidney disease, stage 3 (moderate): Secondary | ICD-10-CM | POA: Diagnosis not present

## 2017-01-11 ENCOUNTER — Ambulatory Visit (AMBULATORY_SURGERY_CENTER): Payer: Medicare Other | Admitting: Internal Medicine

## 2017-01-11 ENCOUNTER — Encounter: Payer: Self-pay | Admitting: Internal Medicine

## 2017-01-11 VITALS — BP 186/78 | HR 76 | Temp 97.8°F | Resp 13 | Ht 68.0 in | Wt 213.0 lb

## 2017-01-11 DIAGNOSIS — B3781 Candidal esophagitis: Secondary | ICD-10-CM

## 2017-01-11 DIAGNOSIS — R7309 Other abnormal glucose: Secondary | ICD-10-CM | POA: Diagnosis not present

## 2017-01-11 DIAGNOSIS — Z86010 Personal history of colonic polyps: Secondary | ICD-10-CM | POA: Diagnosis not present

## 2017-01-11 DIAGNOSIS — R1310 Dysphagia, unspecified: Secondary | ICD-10-CM

## 2017-01-11 DIAGNOSIS — K6389 Other specified diseases of intestine: Secondary | ICD-10-CM | POA: Diagnosis not present

## 2017-01-11 DIAGNOSIS — D125 Benign neoplasm of sigmoid colon: Secondary | ICD-10-CM

## 2017-01-11 DIAGNOSIS — I10 Essential (primary) hypertension: Secondary | ICD-10-CM | POA: Diagnosis not present

## 2017-01-11 MED ORDER — FLUCONAZOLE 100 MG PO TABS: 100.0000 mg | ORAL_TABLET | Freq: Every day | ORAL | 0 refills | Status: AC

## 2017-01-11 MED ORDER — SODIUM CHLORIDE 0.9 % IV SOLN: 500.0000 mL | INTRAVENOUS | Status: DC

## 2017-01-11 MED ORDER — INSULIN REGULAR HUMAN 100 UNIT/ML IJ SOLN
5.0000 [IU] | Freq: Once | INTRAMUSCULAR | Status: AC
  Administered 2017-01-11: 5 [IU] via INTRAVENOUS

## 2017-01-11 MED ORDER — COLCHICINE 0.6 MG PO TABS: 0.6000 mg | ORAL_TABLET | Freq: Every day | ORAL | Status: DC | PRN

## 2017-01-11 MED ORDER — SIMVASTATIN 20 MG PO TABS: 20.0000 mg | ORAL_TABLET | Freq: Every day | ORAL | Status: DC

## 2017-01-11 MED ORDER — INSULIN REGULAR HUMAN 100 UNIT/ML IJ SOLN
5.0000 [IU] | Freq: Once | INTRAMUSCULAR | Status: AC
  Administered 2017-01-11: 5 [IU] via SUBCUTANEOUS

## 2017-01-11 NOTE — Op Note (Signed)
[facility] Patient Name: Jaclyn Herrera Procedure Date: 01/11/2017 10:32 AM MRN: [id number] Endoscopist: Gatha Mayer , MD Age: 71 Referring MD:  Date of Birth: [birth year] Gender: Female Account #: [id number] Procedure:                Upper GI endoscopy Indications:              Dysphagia Medicines:                Propofol per Anesthesia, Monitored Anesthesia Care Procedure:                Pre-Anesthesia Assessment:                           - Prior to the procedure, a History and Physical                            was performed, and patient medications and                            allergies were reviewed. The patient's tolerance of                            previous anesthesia was also reviewed. The risks                            and benefits of the procedure and the sedation                            options and risks were discussed with the patient.                            All questions were answered, and informed consent                            was obtained. Prior Anticoagulants: The patient has                            taken no previous anticoagulant or antiplatelet                            agents. ASA Grade Assessment: III - A patient with                            severe systemic disease. After reviewing the risks                            and benefits, the patient was deemed in                            satisfactory condition to undergo the procedure.                           After obtaining informed consent, the endoscope was  passed under direct vision. Throughout the                            procedure, the patient's blood pressure, pulse, and                            oxygen saturations were monitored continuously. The                            Endoscope was introduced through the mouth, and                            advanced to the second part of duodenum. The upper                            GI endoscopy was  accomplished without difficulty.                            The patient tolerated the procedure well. Scope In: Scope Out: Findings:                 Diffuse candidiasis was found in the middle third                            of the esophagus and in the lower third of the                            esophagus.                           The exam was otherwise without abnormality.                           The cardia and gastric fundus were normal on                            retroflexion.                           The scope was withdrawn. Dilation was performed in                            the entire esophagus with a Maloney dilator with                            mild resistance at 54 Fr. Estimated blood loss:                            none. Complications:            No immediate complications. Estimated Blood Loss:     Estimated blood loss: none. Impression:               - Monilial esophagitis.                           -  The examination was otherwise normal.                           - Dilation performed in the entire esophagus.                           - No specimens collected. Recommendation:           - Patient has a contact number available for                            emergencies. The signs and symptoms of potential                            delayed complications were discussed with the                            patient. Return to normal activities tomorrow.                            Written discharge instructions were provided to the                            patient.                           - Clear liquids x 1 hour then soft foods rest of                            day. Start prior diet tomorrow.                           - Fluconazole 100 mg daily x 14 d                           HOLD SIMVISTATIN AND COLCHICINE WHEN ON THIS                           If still having problems after this then call back                           - Continue present medications. Gatha Mayer, MD 01/11/2017 11:49:00 AM This report has been signed electronically.

## 2017-01-11 NOTE — Op Note (Signed)
[place] Patient Name: Jaclyn Herrera Procedure Date: 01/11/2017 10:32 AM MRN: [id number] Endoscopist: Gatha Mayer , MD Age: 71 Referring MD:  Date of Birth: [birth year] Gender: Female Account #: [id number] Procedure:                Colonoscopy Indications:              High risk colon cancer surveillance: Personal                            history of colonic polyps Medicines:                Propofol per Anesthesia, Monitored Anesthesia Care Procedure:                Pre-Anesthesia Assessment:                           - Prior to the procedure, a History and Physical                            was performed, and patient medications and                            allergies were reviewed. The patient's tolerance of                            previous anesthesia was also reviewed. The risks                            and benefits of the procedure and the sedation                            options and risks were discussed with the patient.                            All questions were answered, and informed consent                            was obtained. Prior Anticoagulants: The patient has                            taken no previous anticoagulant or antiplatelet                            agents. ASA Grade Assessment: III - A patient with                            severe systemic disease. After reviewing the risks                            and benefits, the patient was deemed in                            satisfactory condition to undergo the procedure.  After obtaining informed consent, the colonoscope                            was passed under direct vision. Throughout the                            procedure, the patient's blood pressure, pulse, and                            oxygen saturations were monitored continuously. The                            Colonoscope was introduced through the anus and                            advanced to  the the cecum, identified by                            appendiceal orifice and ileocecal valve. The                            colonoscopy was performed without difficulty. The                            patient tolerated the procedure well. The quality                            of the bowel preparation was adequate. The bowel                            preparation used was Miralax. The ileocecal valve,                            appendiceal orifice, and rectum were photographed. Scope In: 11:17:48 AM Scope Out: 11:35:33 AM Scope Withdrawal Time: 0 hours 14 minutes 4 seconds  Total Procedure Duration: 0 hours 17 minutes 45 seconds  Findings:                 The perianal and digital rectal examinations were                            normal.                           A diminutive polyp was found in the sigmoid colon.                            The polyp was sessile. The polyp was removed with a                            cold snare. Resection and retrieval were complete.                            Verification of patient identification for the  specimen was done. Estimated blood loss was minimal.                           Diverticula were found in the sigmoid colon.                           The exam was otherwise without abnormality on                            direct and retroflexion views. Complications:            No immediate complications. Estimated Blood Loss:     Estimated blood loss was minimal. Impression:               - One diminutive polyp in the sigmoid colon,                            removed with a cold snare. Resected and retrieved.                           - Diverticulosis in the sigmoid colon.                           - The examination was otherwise normal on direct                            and retroflexion views. Recommendation:           - Patient has a contact number available for                            emergencies. The signs and  symptoms of potential                            delayed complications were discussed with the                            patient. Return to normal activities tomorrow.                            Written discharge instructions were provided to the                            patient.                           - Clear liquids x 1 hour then soft foods rest of                            day. Start prior diet tomorrow.                           - Continue present medications.                           - Repeat colonoscopy is recommended for  surveillance. The colonoscopy date will be                            determined after pathology results from today's                            exam become available for review. Gatha Mayer, MD 01/11/2017 11:50:50 AM This report has been signed electronically.

## 2017-01-11 NOTE — Progress Notes (Signed)
1017 Novolin R insulin 5 units IV, Novolin R 5 units SQ by Alphonsa Gin RN. 5831 rechecked blood sugar left middle finger, 525. Reported to Dr Carlean Purl.

## 2017-01-11 NOTE — Patient Instructions (Addendum)
I dilated the esophagus to help you swallow better.  You also have a yeast infection in the esophagus - called candida. I am treating you with fluconazole for this. DO NOT TAKE SIMVISTATIN AND COLCHICINE WHEN TAKING FLUCONAZOLE  If you still cannot swallow right after this call me back  Also removed one colon polyp.  I will let you know pathology results and when to have another routine colonoscopy by mail and/or My Chart.  PLEASE SEE DR. FAGAN ABOUT POORLY CONTROLLED BLOOD SUGARS  I appreciate the opportunity to care for you. Gatha Mayer, MD, [facility]    Discharge instructions given. Handouts on polyps,diverticulosis and a dilatation diet. Prescription sent to pharmacy. Hold Simvastatin and Colchicine while taking Fluconazole. Resume previous medication. YOU HAD AN ENDOSCOPIC PROCEDURE TODAY AT [facility]:   Refer to the procedure report that was given to you for any specific questions about what was found during the examination.  If the procedure report does not answer your questions, please call your gastroenterologist to clarify.  If you requested that your care partner not be given the details of your procedure findings, then the procedure report has been included in a sealed envelope for you to review at your convenience later.  YOU SHOULD EXPECT: Some feelings of bloating in the abdomen. Passage of more gas than usual.  Walking can help get rid of the air that was put into your GI tract during the procedure and reduce the bloating. If you had a lower endoscopy (such as a colonoscopy or flexible sigmoidoscopy) you may notice spotting of blood in your stool or on the toilet paper. If you underwent a bowel prep for your procedure, you may not have a normal bowel movement for a few days.  Please Note:  You might notice some irritation and congestion in your nose or some drainage.  This is from the oxygen used during your procedure.  There is no need for concern  and it should clear up in a day or so.  SYMPTOMS TO REPORT IMMEDIATELY:   Following lower endoscopy (colonoscopy or flexible sigmoidoscopy):  Excessive amounts of blood in the stool  Significant tenderness or worsening of abdominal pains  Swelling of the abdomen that is new, acute  Fever of 100F or higher   Following upper endoscopy (EGD)  Vomiting of blood or coffee ground material  New chest pain or pain under the shoulder blades  Painful or persistently difficult swallowing  New shortness of breath  Fever of 100F or higher  Black, tarry-looking stools  For urgent or emergent issues, a gastroenterologist can be reached at any hour by calling [phone].   DIET:  We do recommend a small meal at first, but then you may proceed to your regular diet.  Drink plenty of fluids but you should avoid alcoholic beverages for 24 hours.  ACTIVITY:  You should plan to take it easy for the rest of today and you should NOT DRIVE or use heavy machinery until tomorrow (because of the sedation medicines used during the test).    FOLLOW UP: Our staff will call the number listed on your records the next business day following your procedure to check on you and address any questions or concerns that you may have regarding the information given to you following your procedure. If we do not reach you, we will leave a message.  However, if you are feeling well and you are not experiencing any problems, there is no need  to return our call.  We will assume that you have returned to your regular daily activities without incident.  If any biopsies were taken you will be contacted by phone or by letter within the next 1-3 weeks.  Please call us at [phone] if you have not heard about the biopsies in 3 weeks.    SIGNATURES/CONFIDENTIALITY: You and/or your care partner have signed paperwork which will be entered into your electronic medical record.  These signatures attest to the fact that that the  information above on your After Visit Summary has been reviewed and is understood.  Full responsibility of the confidentiality of this discharge information lies with you and/or your care-partner.

## 2017-01-11 NOTE — Progress Notes (Signed)
Report to PACU, RN, vss, BBS= Clear.  

## 2017-01-11 NOTE — Progress Notes (Signed)
Called to room to assist during endoscopic procedure.  Patient ID and intended procedure confirmed with present staff. Received instructions for my participation in the procedure from the performing physician.  

## 2017-01-11 NOTE — Progress Notes (Signed)
On arrival patient stating she is so tired, slept only 1.5 hours last evening. Checked patients blood sugar in left index finger, results-527. Patient stating her blood sugar at 0600 was 187 at home. No diabetic meds. This am.And only diabetic medication that the patient takes is Humulin R 10 units sq in AM, 5 UNITS AT 1300 AND 5 UNITS SQ IN THE EVENING. RECHECKED BLOOD SUGAR RIGHT INDEX [id number]. INFORMED DR [place]. ORDERS RECEIVED NOVOLIN R 5 UNITS IV AND 5 UNITS SQ. INFORMED NATALIE SECHRISTRN UNIT MANAGER.

## 2017-01-12 ENCOUNTER — Telehealth: Payer: Self-pay

## 2017-01-12 ENCOUNTER — Other Ambulatory Visit: Payer: Self-pay

## 2017-01-12 NOTE — Telephone Encounter (Signed)
Attempted to reach pt. With follow up call following endoscopic procedure 01/11/2017.  LM on pt. Ans. Machine to call if she has any questions or concerns.

## 2017-01-12 NOTE — Telephone Encounter (Signed)
Left message on answering machine. 

## 2017-01-19 ENCOUNTER — Encounter: Payer: Self-pay | Admitting: Internal Medicine

## 2017-01-19 DIAGNOSIS — Z86010 Personal history of colonic polyps: Secondary | ICD-10-CM

## 2017-01-19 NOTE — Progress Notes (Signed)
Benign mucosal polyp Colonoscopy recall not needed

## 2017-01-19 NOTE — Progress Notes (Signed)
Correction hx adenomas recall 2023 - possible

## 2017-02-23 DIAGNOSIS — E1140 Type 2 diabetes mellitus with diabetic neuropathy, unspecified: Secondary | ICD-10-CM | POA: Diagnosis not present

## 2017-02-23 DIAGNOSIS — D510 Vitamin B12 deficiency anemia due to intrinsic factor deficiency: Secondary | ICD-10-CM | POA: Diagnosis not present

## 2017-02-23 DIAGNOSIS — Z1389 Encounter for screening for other disorder: Secondary | ICD-10-CM | POA: Diagnosis not present

## 2017-02-23 DIAGNOSIS — I5033 Acute on chronic diastolic (congestive) heart failure: Secondary | ICD-10-CM | POA: Diagnosis not present

## 2017-02-23 DIAGNOSIS — K222 Esophageal obstruction: Secondary | ICD-10-CM | POA: Diagnosis not present

## 2017-02-23 DIAGNOSIS — N08 Glomerular disorders in diseases classified elsewhere: Secondary | ICD-10-CM | POA: Diagnosis not present

## 2017-03-09 DIAGNOSIS — J329 Chronic sinusitis, unspecified: Secondary | ICD-10-CM | POA: Diagnosis not present

## 2017-03-09 DIAGNOSIS — N390 Urinary tract infection, site not specified: Secondary | ICD-10-CM | POA: Diagnosis not present

## 2017-03-26 DIAGNOSIS — R269 Unspecified abnormalities of gait and mobility: Secondary | ICD-10-CM | POA: Diagnosis not present

## 2017-03-26 DIAGNOSIS — Z79899 Other long term (current) drug therapy: Secondary | ICD-10-CM | POA: Diagnosis not present

## 2017-03-26 DIAGNOSIS — E1140 Type 2 diabetes mellitus with diabetic neuropathy, unspecified: Secondary | ICD-10-CM | POA: Diagnosis not present

## 2017-03-27 DIAGNOSIS — E113293 Type 2 diabetes mellitus with mild nonproliferative diabetic retinopathy without macular edema, bilateral: Secondary | ICD-10-CM | POA: Diagnosis not present

## 2017-04-06 DIAGNOSIS — I5032 Chronic diastolic (congestive) heart failure: Secondary | ICD-10-CM | POA: Diagnosis not present

## 2017-04-06 DIAGNOSIS — Z79899 Other long term (current) drug therapy: Secondary | ICD-10-CM | POA: Diagnosis not present

## 2017-04-06 DIAGNOSIS — E785 Hyperlipidemia, unspecified: Secondary | ICD-10-CM | POA: Diagnosis not present

## 2017-04-16 DIAGNOSIS — I10 Essential (primary) hypertension: Secondary | ICD-10-CM | POA: Diagnosis not present

## 2017-04-16 DIAGNOSIS — Z23 Encounter for immunization: Secondary | ICD-10-CM | POA: Diagnosis not present

## 2017-04-16 DIAGNOSIS — I5022 Chronic systolic (congestive) heart failure: Secondary | ICD-10-CM | POA: Diagnosis not present

## 2017-04-17 ENCOUNTER — Other Ambulatory Visit (HOSPITAL_COMMUNITY): Payer: Self-pay | Admitting: Internal Medicine

## 2017-04-17 DIAGNOSIS — Z1231 Encounter for screening mammogram for malignant neoplasm of breast: Secondary | ICD-10-CM

## 2017-04-17 DIAGNOSIS — Z780 Asymptomatic menopausal state: Secondary | ICD-10-CM

## 2017-04-27 ENCOUNTER — Encounter (HOSPITAL_COMMUNITY): Payer: Self-pay

## 2017-04-27 ENCOUNTER — Ambulatory Visit (HOSPITAL_COMMUNITY): Payer: Medicare Other

## 2017-04-27 ENCOUNTER — Ambulatory Visit (HOSPITAL_COMMUNITY)
Admission: RE | Admit: 2017-04-27 | Discharge: 2017-04-27 | Disposition: A | Discharge status: Home / Self Care | Payer: Medicare Other | Source: Ambulatory Visit | Attending: Internal Medicine | Admitting: Internal Medicine

## 2017-04-27 DIAGNOSIS — Z1231 Encounter for screening mammogram for malignant neoplasm of breast: Secondary | ICD-10-CM | POA: Diagnosis not present

## 2017-04-27 DIAGNOSIS — Z780 Asymptomatic menopausal state: Secondary | ICD-10-CM | POA: Diagnosis not present

## 2017-04-27 DIAGNOSIS — Z13820 Encounter for screening for osteoporosis: Secondary | ICD-10-CM | POA: Insufficient documentation

## 2017-04-27 DIAGNOSIS — M810 Age-related osteoporosis without current pathological fracture: Secondary | ICD-10-CM | POA: Insufficient documentation

## 2017-05-29 ENCOUNTER — Other Ambulatory Visit: Payer: Self-pay

## 2017-05-29 ENCOUNTER — Observation Stay (HOSPITAL_COMMUNITY)
Admission: EM | Admit: 2017-05-29 | Discharge: 2017-05-31 | Disposition: A | Discharge status: Home / Self Care | Payer: Medicare Other | Attending: Internal Medicine | Admitting: Internal Medicine

## 2017-05-29 ENCOUNTER — Encounter (HOSPITAL_COMMUNITY): Payer: Self-pay | Admitting: *Deleted

## 2017-05-29 DIAGNOSIS — N179 Acute kidney failure, unspecified: Secondary | ICD-10-CM | POA: Diagnosis not present

## 2017-05-29 DIAGNOSIS — Z794 Long term (current) use of insulin: Secondary | ICD-10-CM | POA: Insufficient documentation

## 2017-05-29 DIAGNOSIS — Z66 Do not resuscitate: Secondary | ICD-10-CM | POA: Insufficient documentation

## 2017-05-29 DIAGNOSIS — G253 Myoclonus: Secondary | ICD-10-CM | POA: Insufficient documentation

## 2017-05-29 DIAGNOSIS — Z91148 Patient's other noncompliance with medication regimen for other reason: Secondary | ICD-10-CM

## 2017-05-29 DIAGNOSIS — Z9114 Patient's other noncompliance with medication regimen: Secondary | ICD-10-CM

## 2017-05-29 DIAGNOSIS — J449 Chronic obstructive pulmonary disease, unspecified: Secondary | ICD-10-CM | POA: Diagnosis not present

## 2017-05-29 DIAGNOSIS — I5032 Chronic diastolic (congestive) heart failure: Secondary | ICD-10-CM

## 2017-05-29 DIAGNOSIS — E785 Hyperlipidemia, unspecified: Secondary | ICD-10-CM | POA: Diagnosis not present

## 2017-05-29 DIAGNOSIS — E1110 Type 2 diabetes mellitus with ketoacidosis without coma: Secondary | ICD-10-CM | POA: Diagnosis not present

## 2017-05-29 DIAGNOSIS — I110 Hypertensive heart disease with heart failure: Secondary | ICD-10-CM | POA: Diagnosis not present

## 2017-05-29 DIAGNOSIS — Z91040 Latex allergy status: Secondary | ICD-10-CM | POA: Diagnosis not present

## 2017-05-29 DIAGNOSIS — R739 Hyperglycemia, unspecified: Secondary | ICD-10-CM | POA: Diagnosis not present

## 2017-05-29 DIAGNOSIS — E1165 Type 2 diabetes mellitus with hyperglycemia: Secondary | ICD-10-CM | POA: Diagnosis present

## 2017-05-29 DIAGNOSIS — Z79899 Other long term (current) drug therapy: Secondary | ICD-10-CM | POA: Diagnosis not present

## 2017-05-29 DIAGNOSIS — E1065 Type 1 diabetes mellitus with hyperglycemia: Secondary | ICD-10-CM | POA: Diagnosis not present

## 2017-05-29 DIAGNOSIS — G40109 Localization-related (focal) (partial) symptomatic epilepsy and epileptic syndromes with simple partial seizures, not intractable, without status epilepticus: Secondary | ICD-10-CM

## 2017-05-29 LAB — CBG MONITORING, ED: Glucose-Capillary: >600 mg/dL (ref 65–99)

## 2017-05-29 NOTE — ED Triage Notes (Signed)
Pt brought in by [facility] for c/o hyperglycemia; pt states she is having some tremors in her right leg and states her cbg machine at home read 580; ems reports her cbg as over 600; pt denies any pain

## 2017-05-30 ENCOUNTER — Observation Stay (HOSPITAL_COMMUNITY)
Admit: 2017-05-30 | Discharge: 2017-05-30 | Disposition: A | Discharge status: Home / Self Care | Payer: Medicare Other | Attending: Internal Medicine | Admitting: Internal Medicine

## 2017-05-30 ENCOUNTER — Emergency Department (HOSPITAL_COMMUNITY): Payer: Medicare Other

## 2017-05-30 DIAGNOSIS — E1110 Type 2 diabetes mellitus with ketoacidosis without coma: Secondary | ICD-10-CM

## 2017-05-30 DIAGNOSIS — Z9119 Patient's noncompliance with other medical treatment and regimen: Secondary | ICD-10-CM

## 2017-05-30 DIAGNOSIS — E1310 Other specified diabetes mellitus with ketoacidosis without coma: Secondary | ICD-10-CM

## 2017-05-30 DIAGNOSIS — N179 Acute kidney failure, unspecified: Secondary | ICD-10-CM | POA: Diagnosis not present

## 2017-05-30 DIAGNOSIS — G9340 Encephalopathy, unspecified: Secondary | ICD-10-CM | POA: Diagnosis not present

## 2017-05-30 DIAGNOSIS — G40109 Localization-related (focal) (partial) symptomatic epilepsy and epileptic syndromes with simple partial seizures, not intractable, without status epilepticus: Secondary | ICD-10-CM | POA: Diagnosis not present

## 2017-05-30 DIAGNOSIS — Z9114 Patient's other noncompliance with medication regimen: Secondary | ICD-10-CM | POA: Diagnosis not present

## 2017-05-30 DIAGNOSIS — I5032 Chronic diastolic (congestive) heart failure: Secondary | ICD-10-CM

## 2017-05-30 LAB — COMPREHENSIVE METABOLIC PANEL
ALBUMIN: 3.8 g/dL (ref 3.5–5.0)
ALT: 12 U/L — ABNORMAL LOW (ref 14–54)
AST: 10 U/L — ABNORMAL LOW (ref 15–41)
Alkaline Phosphatase: 218 U/L — ABNORMAL HIGH (ref 38–126)
BUN: 36 mg/dL — ABNORMAL HIGH (ref 6–20)
CHLORIDE: 95 mmol/L — AB (ref 101–111)
CO2: 17 mmol/L — AB (ref 22–32)
Calcium: 8.7 mg/dL — ABNORMAL LOW (ref 8.9–10.3)
Creatinine, Ser: 1.43 mg/dL — ABNORMAL HIGH (ref 0.44–1.00)
GFR calc non Af Amer: 36 mL/min — ABNORMAL LOW (ref >60)
GFR, EST AFRICAN AMERICAN: 41 mL/min — AB (ref >60)
Glucose, Bld: 811 mg/dL (ref 65–99)
POTASSIUM: 5.3 mmol/L — AB (ref 3.5–5.1)
SODIUM: 133 mmol/L — AB (ref 135–145)
Total Bilirubin: 2.6 mg/dL — ABNORMAL HIGH (ref 0.3–1.2)
Total Protein: 6.9 g/dL (ref 6.5–8.1)

## 2017-05-30 LAB — BILIRUBIN, FRACTIONATED(TOT/DIR/INDIR)
BILIRUBIN INDIRECT: 0.8 mg/dL (ref 0.3–0.9)
Bilirubin, Direct: 0.1 mg/dL (ref 0.1–0.5)
Total Bilirubin: 0.9 mg/dL (ref 0.3–1.2)

## 2017-05-30 LAB — BLOOD GAS, VENOUS
Acid-Base Excess: 9.2 mmol/L — ABNORMAL HIGH (ref 0.0–2.0)
Bicarbonate: 16 mmol/L — ABNORMAL LOW (ref 20.0–28.0)
PO2 VEN: 40.5 mmHg (ref 32.0–45.0)
pCO2, Ven: 45 mmHg (ref 44.0–60.0)
pH, Ven: 7.208 — ABNORMAL LOW (ref 7.250–7.430)

## 2017-05-30 LAB — BASIC METABOLIC PANEL
ANION GAP: 13 (ref 5–15)
ANION GAP: 11 (ref 5–15)
ANION GAP: 12 (ref 5–15)
BUN: 30 mg/dL — AB (ref 6–20)
BUN: 30 mg/dL — ABNORMAL HIGH (ref 6–20)
BUN: 31 mg/dL — ABNORMAL HIGH (ref 6–20)
CHLORIDE: 103 mmol/L (ref 101–111)
CO2: 24 mmol/L (ref 22–32)
CO2: 25 mmol/L (ref 22–32)
CO2: 22 mmol/L (ref 22–32)
Calcium: 8.8 mg/dL — ABNORMAL LOW (ref 8.9–10.3)
Calcium: 8.6 mg/dL — ABNORMAL LOW (ref 8.9–10.3)
Calcium: 8.2 mg/dL — ABNORMAL LOW (ref 8.9–10.3)
Chloride: 103 mmol/L (ref 101–111)
Chloride: 103 mmol/L (ref 101–111)
Creatinine, Ser: 1.15 mg/dL — ABNORMAL HIGH (ref 0.44–1.00)
Creatinine, Ser: 1.08 mg/dL — ABNORMAL HIGH (ref 0.44–1.00)
Creatinine, Ser: 1.12 mg/dL — ABNORMAL HIGH (ref 0.44–1.00)
GFR calc Af Amer: 54 mL/min — ABNORMAL LOW (ref >60)
GFR calc Af Amer: 58 mL/min — ABNORMAL LOW (ref >60)
GFR calc Af Amer: 55 mL/min — ABNORMAL LOW (ref >60)
GFR calc non Af Amer: 46 mL/min — ABNORMAL LOW (ref >60)
GFR calc non Af Amer: 50 mL/min — ABNORMAL LOW (ref >60)
GFR calc non Af Amer: 48 mL/min — ABNORMAL LOW (ref >60)
GLUCOSE: 233 mg/dL — AB (ref 65–99)
GLUCOSE: 170 mg/dL — AB (ref 65–99)
GLUCOSE: 252 mg/dL — AB (ref 65–99)
POTASSIUM: 3.9 mmol/L (ref 3.5–5.1)
POTASSIUM: 4 mmol/L (ref 3.5–5.1)
POTASSIUM: 3.8 mmol/L (ref 3.5–5.1)
Sodium: 140 mmol/L (ref 135–145)
Sodium: 139 mmol/L (ref 135–145)
Sodium: 137 mmol/L (ref 135–145)

## 2017-05-30 LAB — CBC WITH DIFFERENTIAL/PLATELET
BASOS ABS: 0 10*3/uL (ref 0.0–0.1)
Basophils Relative: 0 %
EOS PCT: 0 %
Eosinophils Absolute: 0 10*3/uL (ref 0.0–0.7)
HCT: 33.2 % — ABNORMAL LOW (ref 36.0–46.0)
HEMOGLOBIN: 11.2 g/dL — AB (ref 12.0–15.0)
LYMPHS PCT: 10 %
Lymphs Abs: 0.4 10*3/uL — ABNORMAL LOW (ref 0.7–4.0)
MCH: 28.1 pg (ref 26.0–34.0)
MCHC: 33.7 g/dL (ref 30.0–36.0)
MCV: 83.2 fL (ref 78.0–100.0)
Monocytes Absolute: 0.2 10*3/uL (ref 0.1–1.0)
Monocytes Relative: 4 %
NEUTROS ABS: 3.9 10*3/uL (ref 1.7–7.7)
NEUTROS PCT: 86 %
PLATELETS: 156 10*3/uL (ref 150–400)
RBC: 3.99 MIL/uL (ref 3.87–5.11)
RDW: 14.9 % (ref 11.5–15.5)
WBC: 4.6 10*3/uL (ref 4.0–10.5)

## 2017-05-30 LAB — URINALYSIS, ROUTINE W REFLEX MICROSCOPIC
Bacteria, UA: NONE SEEN
Bilirubin Urine: NEGATIVE
KETONES UR: 80 mg/dL — AB
LEUKOCYTES UA: NEGATIVE
Nitrite: NEGATIVE
PROTEIN: 30 mg/dL — AB
Specific Gravity, Urine: 1.017 (ref 1.005–1.030)
pH: 5 (ref 5.0–8.0)

## 2017-05-30 LAB — GLUCOSE, CAPILLARY
GLUCOSE-CAPILLARY: 208 mg/dL — AB (ref 65–99)
GLUCOSE-CAPILLARY: 147 mg/dL — AB (ref 65–99)
GLUCOSE-CAPILLARY: 136 mg/dL — AB (ref 65–99)
GLUCOSE-CAPILLARY: 162 mg/dL — AB (ref 65–99)
Glucose-Capillary: 182 mg/dL — ABNORMAL HIGH (ref 65–99)
Glucose-Capillary: 368 mg/dL — ABNORMAL HIGH (ref 65–99)

## 2017-05-30 LAB — LACTIC ACID, PLASMA
LACTIC ACID, VENOUS: 1.2 mmol/L (ref 0.5–1.9)
Lactic Acid, Venous: 1.6 mmol/L (ref 0.5–1.9)

## 2017-05-30 LAB — CBG MONITORING, ED
GLUCOSE-CAPILLARY: 425 mg/dL — AB (ref 65–99)
Glucose-Capillary: 491 mg/dL — ABNORMAL HIGH (ref 65–99)
Glucose-Capillary: 330 mg/dL — ABNORMAL HIGH (ref 65–99)

## 2017-05-30 LAB — MRSA PCR SCREENING: MRSA by PCR: POSITIVE — AB

## 2017-05-30 MED ORDER — ONDANSETRON HCL 4 MG/2ML IJ SOLN
4.0000 mg | Freq: Once | INTRAMUSCULAR | Status: AC
  Administered 2017-05-30: 4 mg via INTRAVENOUS
  Filled 2017-05-30: qty 2

## 2017-05-30 MED ORDER — SODIUM CHLORIDE 0.9 % IV SOLN
INTRAVENOUS | Status: DC
  Administered 2017-05-30: 4.4 [IU]/h via INTRAVENOUS
  Filled 2017-05-30: qty 1

## 2017-05-30 MED ORDER — INSULIN ASPART 100 UNIT/ML ~~LOC~~ SOLN
0.0000 [IU] | Freq: Three times a day (TID) | SUBCUTANEOUS | Status: DC
  Administered 2017-05-30: 8 [IU] via SUBCUTANEOUS
  Administered 2017-05-31: 15 [IU] via SUBCUTANEOUS

## 2017-05-30 MED ORDER — CARVEDILOL 3.125 MG PO TABS
6.2500 mg | ORAL_TABLET | Freq: Two times a day (BID) | ORAL | Status: DC
  Administered 2017-05-30 – 2017-05-31 (×3): 6.25 mg via ORAL
  Filled 2017-05-30 (×3): qty 2

## 2017-05-30 MED ORDER — LORAZEPAM 2 MG/ML IJ SOLN
1.0000 mg | Freq: Once | INTRAMUSCULAR | Status: AC
  Administered 2017-05-30: 1 mg via INTRAVENOUS
  Filled 2017-05-30: qty 1

## 2017-05-30 MED ORDER — SIMVASTATIN 20 MG PO TABS
20.0000 mg | ORAL_TABLET | Freq: Every day | ORAL | Status: DC
  Administered 2017-05-30: 20 mg via ORAL
  Filled 2017-05-30: qty 1

## 2017-05-30 MED ORDER — ENOXAPARIN SODIUM 40 MG/0.4ML ~~LOC~~ SOLN
40.0000 mg | SUBCUTANEOUS | Status: DC
  Administered 2017-05-30 – 2017-05-31 (×2): 40 mg via SUBCUTANEOUS
  Filled 2017-05-30 (×2): qty 0.4

## 2017-05-30 MED ORDER — DEXTROSE-NACL 5-0.45 % IV SOLN
INTRAVENOUS | Status: DC
  Administered 2017-05-30: 08:00:00 via INTRAVENOUS

## 2017-05-30 MED ORDER — LEVOTHYROXINE SODIUM 25 MCG PO TABS
125.0000 ug | ORAL_TABLET | Freq: Every day | ORAL | Status: DC
  Administered 2017-05-30 – 2017-05-31 (×2): 125 ug via ORAL
  Filled 2017-05-30 (×2): qty 1

## 2017-05-30 MED ORDER — VITAMIN B-12 1000 MCG PO TABS
1000.0000 ug | ORAL_TABLET | Freq: Every day | ORAL | Status: DC
  Administered 2017-05-30 – 2017-05-31 (×2): 1000 ug via ORAL
  Filled 2017-05-30 (×2): qty 1

## 2017-05-30 MED ORDER — LISINOPRIL 10 MG PO TABS
40.0000 mg | ORAL_TABLET | Freq: Every day | ORAL | Status: DC
  Administered 2017-05-30 – 2017-05-31 (×2): 40 mg via ORAL
  Filled 2017-05-30 (×2): qty 4

## 2017-05-30 MED ORDER — SODIUM CHLORIDE 0.9 % IV SOLN
INTRAVENOUS | Status: DC
  Administered 2017-05-30: 5.4 [IU]/h via INTRAVENOUS
  Filled 2017-05-30 (×2): qty 1

## 2017-05-30 MED ORDER — ACETAMINOPHEN 500 MG PO TABS: 500.0000 mg | ORAL_TABLET | Freq: Four times a day (QID) | ORAL | Status: DC | PRN

## 2017-05-30 MED ORDER — INSULIN ASPART 100 UNIT/ML ~~LOC~~ SOLN
0.0000 [IU] | Freq: Every day | SUBCUTANEOUS | Status: DC
  Administered 2017-05-30: 5 [IU] via SUBCUTANEOUS

## 2017-05-30 MED ORDER — SODIUM CHLORIDE 0.9 % IV SOLN
INTRAVENOUS | Status: DC
  Administered 2017-05-30: 12:00:00 via INTRAVENOUS

## 2017-05-30 MED ORDER — QUINAPRIL HCL 10 MG PO TABS: 40.0000 mg | ORAL_TABLET | Freq: Every day | ORAL | Status: DC

## 2017-05-30 MED ORDER — DEXTROSE-NACL 5-0.45 % IV SOLN: INTRAVENOUS | Status: DC

## 2017-05-30 MED ORDER — SODIUM CHLORIDE 0.9 % IV SOLN
500.0000 mg | Freq: Two times a day (BID) | INTRAVENOUS | Status: DC
  Filled 2017-05-30 (×2): qty 5

## 2017-05-30 MED ORDER — PAROXETINE HCL 20 MG PO TABS
40.0000 mg | ORAL_TABLET | Freq: Every day | ORAL | Status: DC
  Administered 2017-05-30 – 2017-05-31 (×2): 40 mg via ORAL
  Filled 2017-05-30 (×2): qty 2

## 2017-05-30 MED ORDER — INSULIN GLARGINE 100 UNIT/ML ~~LOC~~ SOLN
20.0000 [IU] | Freq: Every day | SUBCUTANEOUS | Status: DC
  Administered 2017-05-30 – 2017-05-31 (×2): 20 [IU] via SUBCUTANEOUS
  Filled 2017-05-30 (×3): qty 0.2

## 2017-05-30 MED ORDER — SODIUM CHLORIDE 0.9 % IV BOLUS (SEPSIS)
1000.0000 mL | Freq: Once | INTRAVENOUS | Status: AC
Start: 2017-05-30 — End: 2017-05-30
  Administered 2017-05-30: 1000 mL via INTRAVENOUS

## 2017-05-30 MED ORDER — LEVETIRACETAM IN NACL 500 MG/100ML IV SOLN
INTRAVENOUS | Status: AC
  Filled 2017-05-30: qty 100

## 2017-05-30 MED ORDER — SODIUM CHLORIDE 0.9 % IV BOLUS (SEPSIS)
1000.0000 mL | Freq: Once | INTRAVENOUS | Status: AC
  Administered 2017-05-30: 1000 mL via INTRAVENOUS

## 2017-05-30 MED ORDER — PANTOPRAZOLE SODIUM 40 MG PO TBEC
40.0000 mg | DELAYED_RELEASE_TABLET | Freq: Every day | ORAL | Status: DC
  Administered 2017-05-30 – 2017-05-31 (×2): 40 mg via ORAL
  Filled 2017-05-30 (×2): qty 1

## 2017-05-30 MED ORDER — LEVETIRACETAM IN NACL 500 MG/100ML IV SOLN
500.0000 mg | Freq: Two times a day (BID) | INTRAVENOUS | Status: DC
  Administered 2017-05-30 – 2017-05-31 (×3): 500 mg via INTRAVENOUS
  Filled 2017-05-30 (×4): qty 100

## 2017-05-30 NOTE — Progress Notes (Signed)
Patient transferred to 300 dept. Via wheelchair. Report given to nurse.

## 2017-05-30 NOTE — ED Notes (Signed)
Purewick placed on pt. 

## 2017-05-30 NOTE — Procedures (Signed)
[place] A. Merlene Laughter, MD     [email or web address]           HISTORY: The patient is a 71 year old female who presents with recurrent focal seizures.  MEDICATIONS: Scheduled Meds: . carvedilol  6.25 mg Oral BID  . enoxaparin (LOVENOX) injection  40 mg Subcutaneous Q24H  . insulin aspart  0-15 Units Subcutaneous TID WC  . insulin aspart  0-5 Units Subcutaneous QHS  . insulin glargine  20 Units Subcutaneous Daily  . levothyroxine  125 mcg Oral QAC breakfast  . lisinopril  40 mg Oral Daily  . pantoprazole  40 mg Oral Daily  . PARoxetine  40 mg Oral Daily  . simvastatin  20 mg Oral QHS  . vitamin B-12  1,000 mcg Oral Daily   Continuous Infusions: . levETIRAcetam Stopped (05/30/17 1448)   PRN Meds:.acetaminophen  Prior to Admission medications   Medication Sig Start Date End Date Taking? Authorizing Provider  acetaminophen (TYLENOL) 500 MG tablet Take 500-1,000 mg by mouth every 6 (six) hours as needed (For pain.).    Yes [provider]  acetaminophen-codeine (TYLENOL #3) 300-30 MG tablet  11/16/16  Yes [provider]  Ascorbic Acid (VITAMIN C PO) Take 2 tablets by mouth daily.   Yes [provider]  carvedilol (COREG) 6.25 MG tablet Take 6.25 mg by mouth 2 (two) times daily. 07/21/16  Yes [provider]  esomeprazole (NEXIUM) 20 MG capsule Take 20 mg by mouth daily.    Yes [provider]  fluticasone (FLONASE) 50 MCG/ACT nasal spray Place 2 sprays into the nose 2 (two) times daily as needed for allergies.  10/28/14  Yes [provider]  HUMULIN R 500 UNIT/ML injection Inject 14 units into the skin twice daily. Inject 9 units into the skin daily at bedtime. 06/13/16  Yes [provider]  levETIRAcetam (KEPPRA) 500 MG tablet Take 1 tablet (500 mg total) by mouth 2 (two) times daily. 01/21/16  Yes Asencion Noble, MD  levothyroxine (SYNTHROID, LEVOTHROID) 125 MCG tablet Take 125 mcg by mouth daily before  breakfast.  06/13/16  Yes [provider]  PARoxetine (PAXIL) 40 MG tablet Take 40 mg by mouth daily.   Yes [provider]  Polyethylene Glycol 400 (BLINK TEARS OP) Place 1 drop into both eyes 4 (four) times daily as needed (Dry eyes).    Yes [provider]  potassium chloride (K-DUR) 10 MEQ tablet TAKE ONE TABLET BY MOUTH ONCE DAILY 10/04/16  Yes Burnell Blanks, MD  quinapril (ACCUPRIL) 40 MG tablet Take 40 mg by mouth daily.    Yes [provider]  Senna-Psyllium (PERDIEM PO) Take 1 tablet by mouth daily as needed (For constipation.).    Yes [provider]  simvastatin (ZOCOR) 20 MG tablet Take 1 tablet (20 mg total) by mouth at bedtime. DO NOT TAKE WHILE TAKING FLUCONAZOLE 01/11/17  Yes Gatha Mayer, MD  torsemide (DEMADEX) 20 MG tablet Take 20 mg by mouth 2 (two) times daily.   Yes [provider]  vitamin B-12 (CYANOCOBALAMIN) 1000 MCG tablet Take 1,000 mcg by mouth daily.   Yes [provider]  colchicine 0.6 MG tablet Take 1 tablet (0.6 mg total) by mouth daily as needed. DO NOT TAKE WHILE TAKING FLUCONAZOLE 01/11/17   Gatha Mayer, MD      ANALYSIS: A 16 channel recording using standard 10 20 measurements is conducted for 20 minutes.   The background activity gets as high as a hertz  but mostly she remains in the high theta range of six hertz.  Occasional spindles are seen.  Photic stimulation and hyperventilation are not conducted.  There is no focal or lateralized slowing.  There is no epileptiform activity is observed.   IMPRESSION: 1. This recording shows mild global slowing. This indicates a mild global encephalopathy. There is no epileptiform activity is observed.      Jestina Stephani A. Merlene Laughter, M.D.  Diplomate, Tax adviser of Psychiatry and Neurology ( Neurology).

## 2017-05-30 NOTE — Consult Note (Signed)
[place] A. Jaclyn Laughter, MD     [email or web address]          Jaclyn Herrera is an 71 y.o. female.   ASSESSMENT/PLAN: 1. 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  Continue with Keppra and blood sugar control.  2. 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  Consider polysomnography.  The patient presents with a severe hyperglycemia.  She tells me that she could not get her usual insulin filled because the pharmacy could not get any.  This was complicated by the fact that they so storm came in.  She had some samples given by her endocrinologist.  She started taking this but apparently was not the typical insulin that she takes and her blood sugar was not controlled.  She reports having jerking of the right leg a couple days ago.  This was repeated on yesterday and was more persisting going on for low long periods of time and progressive of the right upper extremity.  Does not appear that she had alteration of consciousness or loss of consciousness.  She is taking Keppra and apparently has been compliant with this.  She was seen here before for seizures thought to be provoked due to hyperglycemia.  She tells me that she has not had any spells other than when she was seen here for seizures about a year ago.  She is currently feeling well.  The review of systems otherwise negative.   GENERAL:  Patient was noted to be sleeping and snoring with witnessed apnea on entered the exam room.  She was easily aroused.  HEENT:  She has significant microagnathia.   ABDOMEN: Soft  EXTREMITIES: No edema; there is hyperpigmented discoloration involving the anterior aspect of the distal right leg.  She tells me this occurred from a previous hospitalization due to local hemorrhage.  There is evidence of a bilateral knee surgery with the incisional scar but.    BACK: Normal alignment.  SKIN: Normal by  inspection.    MENTAL STATUS: Alert and oriented. Speech, language and cognition are generally intact. Judgment and insight normal.   CRANIAL NERVES: Pupils are equal, round and reactive to light and accommodation; extraocular movements are full, there is no significant nystagmus; upper and lower facial muscles are normal in strength and symmetric, there is no flattening of the nasolabial folds; tongue is midline; uvula is midline; shoulder elevation is normal.  MOTOR: Normal tone, bulk and strength; no pronator drift.  COORDINATION: Left finger to nose is normal, right finger to nose is normal, No rest tremor; no intention tremor; no postural tremor; no bradykinesia.  REFLEXES: Deep tendon reflexes are symmetrical and normal. Plantar responses are flexor bilaterally.   SENSATION: Normal to light touch and temperature.      [[[[[[[[[[[[[[[[[[NEURO NOTE 2017 This is most likely a case of provoked seizures due to hyperglycemia and/or acute head injury. Therefore, the risk of recurrent long-term unprovoked seizures is low likely does not warrant long-term antiepileptic medications. Even if this seizure is unprovoked, the risk of epilepsy still low given the normal imaging and normal EEG. For now, we will continue with the [place] which has been initiated. We will see the patient in the office in 2 months and make another reassessment at that time. We will likely wean her off her medications if she has not had any further events.     The patient is a 71 year old white female who  lives by herself. She typically ambulates without assisted devices. The patient got to sit bathroom at night on Sunday and apparently ran into the mirror and sustained frontal and occipital head injuries. She did not appear to have lost consciousness. The following day she tells me that she had a seizure. The spell was witnessed by her caregiver. They describe the patient having what appears to be focal seizures with  shaking/clonic activity and tonic activity involving the right side with subsequent limited progress to the left side. She had a few of these events that day. Interestingly, she does not report having loss of consciousness or alteration of consciousness with these events. The patient reports having significant body aches however. She complains of significant headaches. There is no previous history of seizures. No reports of GI symptoms, chest pain or shortness of breath. The review of systems otherwise negative.   EEG  This recording shows  Moderate global slowing indicating a moderate global encephalopathy. However, there is no epileptiform activity observed.     NEURO NOTE 2016 Episode of unresponsiveness of unclear etiology. The presentation is suspicious for  unwitnessed seizure. Although concerns includes fall with concussive syndrome and cardiac causes. Ischemic stroke or intracranial processes are unlikely. Agree with orthostatic measurements/vitals. An EEG will be obtained. Follow-up MRI.  EEG 1. Right occipital parietal slowing which can be seen in focal cerebral disturbance or epileptic focus. 2. Frontal intermittent rhythmic delta activity which is most commonly seen in metabolic processes/disturbances.]]]]]]]]]]]]]]]]   Blood pressure (!) 151/49, pulse 70, temperature 98.6 F (37 C), temperature source Oral, resp. rate 13, height '5\' 8"'$  (1.727 m), weight 201 lb (91.2 kg), SpO2 99 %.  Past Medical History:  Diagnosis Date  . 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 Arthritis   . Bronchitis   . CHF (congestive heart failure) ([place])   . Chronic diastolic heart failure ([place]) 07/13/2015  . COPD (chronic obstructive pulmonary disease) ([place])   . Depression   . Diabetes mellitus   . DKA (diabetic ketoacidoses) ([place]) 09/27/2014  . GERD (gastroesophageal reflux disease)   . Headache   . Hyperlipidemia   . Hypertension   . Hypothyroid 10/31/2014  . hypothyroidism   .  Pneumonia    hx of   . PONV (postoperative nausea and vomiting)    and slow to wake up after foot surgery   . Seizures ([place])    2017  . Shingles   . Syncope 10/31/2014  . Tubular adenoma of colon   . Vertigo 10/31/2014    Past Surgical History:  Procedure Laterality Date  . ANKLE SURGERY     x 4  . APPENDECTOMY    . CATARACT EXTRACTION Bilateral   . CATARACT EXTRACTION W/ INTRAOCULAR LENS IMPLANT    . CHOLECYSTECTOMY    . COLONOSCOPY W/ BIOPSIES AND POLYPECTOMY    . ESOPHAGOGASTRODUODENOSCOPY    . HARDWARE REMOVAL Left 11/03/2015   Procedure: HARDWARE REMOVAL LEFT FOOT appliction of wound vac;  Surgeon: Gaynelle Arabian, MD;  Location: [facility];  Service: Orthopedics;  Laterality: Left;  . INCISION AND DRAINAGE OF WOUND Left 11/03/2015   Procedure: IRRIGATION AND DEBRIDEMENT LEFT FOOT ULCER;  Surgeon: Gaynelle Arabian, MD;  Location: [facility];  Service: Orthopedics;  Laterality: Left;  . KNEE ARTHROSCOPY    . NASAL SINUS SURGERY    . non cancerous mass removed     small intestine  . oophrectomy    . ORIF CALCANEOUS FRACTURE Left  12/29/2014   Procedure: OPEN REDUCTION INTERNAL FIXATION  LEFT CALCANEOUS FRACTURE;  Surgeon: Gaynelle Arabian, MD;  Location: [facility];  Service: Orthopedics;  Laterality: Left;  . TONSILLECTOMY      Family History  Problem Relation Age of Onset  . Diabetes Mother   . Asthma Mother   . Hypertension Father   . Heart disease Father   . Diabetes Father   . Heart disease Sister   . Hypertension Sister   . Asthma Sister   . Diabetes Brother   . CAD Brother   . Stroke Brother   . Stomach cancer Maternal Aunt     Social History:  reports that  has never smoked. she has never used smokeless tobacco. She reports that she does not drink alcohol or use drugs.  Allergies:  Allergies  Allergen Reactions  . Sulfonamide Derivatives Anaphylaxis  . Bactrim [Sulfamethoxazole-Trimethoprim] Nausea Only  . Erythromycin Nausea And Vomiting  . Latex Itching and Other (See  Comments)    If she wears gloves too long her hands start itching  . Mucinex [Guaifenesin Er] Nausea And Vomiting  . Aspirin Rash  . Levofloxacin Rash  . Tape Rash    Medications: Prior to Admission medications   Medication Sig Start Date End Date Taking? Authorizing Provider  acetaminophen (TYLENOL) 500 MG tablet Take 500-1,000 mg by mouth every 6 (six) hours as needed (For pain.).    Yes [provider]  acetaminophen-codeine (TYLENOL #3) 300-30 MG tablet  11/16/16  Yes [provider]  Ascorbic Acid (VITAMIN C PO) Take 2 tablets by mouth daily.   Yes [provider]  carvedilol (COREG) 6.25 MG tablet Take 6.25 mg by mouth 2 (two) times daily. 07/21/16  Yes [provider]  esomeprazole (NEXIUM) 20 MG capsule Take 20 mg by mouth daily.    Yes [provider]  fluticasone (FLONASE) 50 MCG/ACT nasal spray Place 2 sprays into the nose 2 (two) times daily as needed for allergies.  10/28/14  Yes [provider]  HUMULIN R 500 UNIT/ML injection Inject 14 units into the skin twice daily. Inject 9 units into the skin daily at bedtime. 06/13/16  Yes [provider]  levETIRAcetam (KEPPRA) 500 MG tablet Take 1 tablet (500 mg total) by mouth 2 (two) times daily. 01/21/16  Yes Asencion Noble, MD  levothyroxine (SYNTHROID, LEVOTHROID) 125 MCG tablet Take 125 mcg by mouth daily before breakfast.  06/13/16  Yes [provider]  PARoxetine (PAXIL) 40 MG tablet Take 40 mg by mouth daily.   Yes [provider]  Polyethylene Glycol 400 (BLINK TEARS OP) Place 1 drop into both eyes 4 (four) times daily as needed (Dry eyes).    Yes [provider]  potassium chloride (K-DUR) 10 MEQ tablet TAKE ONE TABLET BY MOUTH ONCE DAILY 10/04/16  Yes Burnell Blanks, MD  quinapril (ACCUPRIL) 40 MG tablet Take 40 mg by mouth daily.    Yes [provider]  Senna-Psyllium (PERDIEM PO) Take 1 tablet by mouth daily as needed (For  constipation.).    Yes [provider]  simvastatin (ZOCOR) 20 MG tablet Take 1 tablet (20 mg total) by mouth at bedtime. DO NOT TAKE WHILE TAKING FLUCONAZOLE 01/11/17  Yes Gatha Mayer, MD  torsemide (DEMADEX) 20 MG tablet Take 20 mg by mouth 2 (two) times daily.   Yes [provider]  vitamin B-12 (CYANOCOBALAMIN) 1000 MCG tablet Take 1,000 mcg by mouth daily.   Yes [provider]  colchicine 0.6 MG tablet Take 1 tablet (0.6 mg total) by mouth daily as needed. DO NOT TAKE WHILE TAKING FLUCONAZOLE 01/11/17   Gatha Mayer, MD    Scheduled Meds: . carvedilol  6.25 mg Oral BID  . enoxaparin (LOVENOX) injection  40 mg Subcutaneous Q24H  . insulin aspart  0-15 Units Subcutaneous TID WC  . insulin aspart  0-5 Units Subcutaneous QHS  . insulin glargine  20 Units Subcutaneous Daily  . levothyroxine  125 mcg Oral QAC breakfast  . lisinopril  40 mg Oral Daily  . pantoprazole  40 mg Oral Daily  . PARoxetine  40 mg Oral Daily  . simvastatin  20 mg Oral QHS  . vitamin B-12  1,000 mcg Oral Daily   Continuous Infusions: . levETIRAcetam Stopped (05/30/17 1448)   PRN Meds:.acetaminophen     Results for orders placed or performed during the hospital encounter of 05/29/17 (from the past 48 hour(s))  CBG monitoring, ED     Status: Abnormal   Collection Time: 05/29/17 11:45 PM  Result Value Ref Range   Glucose-Capillary >600 (HH) 65 - 99 mg/dL  Comprehensive metabolic panel     Status: Abnormal   Collection Time: 05/30/17 12:30 AM  Result Value Ref Range   Sodium 133 (L) 135 - 145 mmol/L   Potassium 5.3 (H) 3.5 - 5.1 mmol/L   Chloride 95 (L) 101 - 111 mmol/L   CO2 17 (L) 22 - 32 mmol/L   Glucose, Bld 811 (HH) 65 - 99 mg/dL    Comment: CRITICAL RESULT CALLED TO, READ BACK BY AND VERIFIED WITH: TALBOTT,T @ 0127 BY MATTHEWS, B 12.19.18 RESULTS CONFIRMED BY MANUAL DILUTION    BUN 36 (H) 6 - 20 mg/dL   Creatinine, Ser 1.43 (H) 0.44 - 1.00 mg/dL   Calcium 8.7 (L)  8.9 - 10.3 mg/dL   Total Protein 6.9 6.5 - 8.1 g/dL   Albumin 3.8 3.5 - 5.0 g/dL   AST 10 (L) 15 - 41 U/L   ALT 12 (L) 14 - 54 U/L   Alkaline Phosphatase 218 (H) 38 - 126 U/L   Total Bilirubin 2.6 (H) 0.3 - 1.2 mg/dL   GFR calc non Af Amer 36 (L) >60 mL/min   GFR calc Af Amer 41 (L) >60 mL/min    Comment: (NOTE) The eGFR has been calculated using the CKD EPI equation. This calculation has not been validated in all clinical situations. eGFR's persistently <60 mL/min signify possible Chronic Kidney Disease.    Anion gap >20 (H) 5 - 15  Lactic acid, plasma     Status: None   Collection Time: 05/30/17 12:30 AM  Result Value Ref Range   Lactic Acid, Venous 1.6 0.5 - 1.9 mmol/L  CBC with Differential     Status: Abnormal   Collection Time: 05/30/17 12:30 AM  Result Value Ref Range   WBC 4.6 4.0 - 10.5 K/uL   RBC 3.99 3.87 - 5.11 MIL/uL   Hemoglobin 11.2 (L) 12.0 - 15.0 g/dL   HCT 33.2 (L) 36.0 - 46.0 %   MCV 83.2 78.0 - 100.0 fL   MCH 28.1 26.0 - 34.0 pg   MCHC 33.7 30.0 - 36.0 g/dL   RDW 14.9 11.5 - 15.5 %   Platelets 156 150 - 400 K/uL   Neutrophils Relative % 86 %   Neutro Abs 3.9 1.7 - 7.7 K/uL   Lymphocytes Relative 10 %   Lymphs Abs 0.4 (L) 0.7 - 4.0 K/uL   Monocytes  Relative 4 %   Monocytes Absolute 0.2 0.1 - 1.0 K/uL   Eosinophils Relative 0 %   Eosinophils Absolute 0.0 0.0 - 0.7 K/uL   Basophils Relative 0 %   Basophils Absolute 0.0 0.0 - 0.1 K/uL  Blood gas, venous     Status: Abnormal   Collection Time: 05/30/17 12:42 AM  Result Value Ref Range   pH, Ven 7.208 (L) 7.250 - 7.430   pCO2, Ven 45.0 44.0 - 60.0 mmHg   pO2, Ven 40.5 32.0 - 45.0 mmHg   Bicarbonate 16.0 (L) 20.0 - 28.0 mmol/L   Acid-Base Excess 9.2 (H) 0.0 - 2.0 mmol/L   Drawn by COLLECTED BY LABORATORY    Sample type VEIN   Urinalysis, Routine w reflex microscopic     Status: Abnormal   Collection Time: 05/30/17  1:10 AM  Result Value Ref Range   Color, Urine STRAW (A) YELLOW   APPearance CLEAR  CLEAR   Specific Gravity, Urine 1.017 1.005 - 1.030   pH 5.0 5.0 - 8.0   Glucose, UA >=500 (A) NEGATIVE mg/dL   Hgb urine dipstick SMALL (A) NEGATIVE   Bilirubin Urine NEGATIVE NEGATIVE   Ketones, ur 80 (A) NEGATIVE mg/dL   Protein, ur 30 (A) NEGATIVE mg/dL   Nitrite NEGATIVE NEGATIVE   Leukocytes, UA NEGATIVE NEGATIVE   RBC / HPF 0-5 0 - 5 RBC/hpf   WBC, UA 0-5 0 - 5 WBC/hpf   Bacteria, UA NONE SEEN NONE SEEN   Squamous Epithelial / LPF 0-5 (A) NONE SEEN  POC CBG, ED     Status: Abnormal   Collection Time: 05/30/17  1:54 AM  Result Value Ref Range   Glucose-Capillary >600 (HH) 65 - 99 mg/dL  Lactic acid, plasma     Status: None   Collection Time: 05/30/17  2:33 AM  Result Value Ref Range   Lactic Acid, Venous 1.2 0.5 - 1.9 mmol/L  CBG monitoring, ED     Status: Abnormal   Collection Time: 05/30/17  3:01 AM  Result Value Ref Range   Glucose-Capillary >600 (HH) 65 - 99 mg/dL  CBG monitoring, ED     Status: Abnormal   Collection Time: 05/30/17  4:02 AM  Result Value Ref Range   Glucose-Capillary 491 (H) 65 - 99 mg/dL  CBG monitoring, ED     Status: Abnormal   Collection Time: 05/30/17  5:06 AM  Result Value Ref Range   Glucose-Capillary 425 (H) 65 - 99 mg/dL  CBG monitoring, ED     Status: Abnormal   Collection Time: 05/30/17  6:12 AM  Result Value Ref Range   Glucose-Capillary 330 (H) 65 - 99 mg/dL  Basic metabolic panel     Status: Abnormal   Collection Time: 05/30/17  7:06 AM  Result Value Ref Range   Sodium 140 135 - 145 mmol/L   Potassium 3.9 3.5 - 5.1 mmol/L    Comment: DELTA CHECK NOTED   Chloride 103 101 - 111 mmol/L   CO2 24 22 - 32 mmol/L   Glucose, Bld 233 (H) 65 - 99 mg/dL   BUN 30 (H) 6 - 20 mg/dL   Creatinine, Ser 1.15 (H) 0.44 - 1.00 mg/dL   Calcium 8.8 (L) 8.9 - 10.3 mg/dL   GFR calc non Af Amer 46 (L) >60 mL/min   GFR calc Af Amer 54 (L) >60 mL/min    Comment: (NOTE) The eGFR has been calculated using the CKD EPI equation. This calculation has not  been validated  in all clinical situations. eGFR's persistently <60 mL/min signify possible Chronic Kidney Disease.    Anion gap 13 5 - 15  Bilirubin, fractionated(tot/dir/indir)     Status: None   Collection Time: 05/30/17  7:08 AM  Result Value Ref Range   Total Bilirubin 0.9 0.3 - 1.2 mg/dL   Bilirubin, Direct 0.1 0.1 - 0.5 mg/dL   Indirect Bilirubin 0.8 0.3 - 0.9 mg/dL  Glucose, capillary     Status: Abnormal   Collection Time: 05/30/17  7:21 AM  Result Value Ref Range   Glucose-Capillary 208 (H) 65 - 99 mg/dL  Glucose, capillary     Status: Abnormal   Collection Time: 05/30/17  8:29 AM  Result Value Ref Range   Glucose-Capillary 147 (H) 65 - 99 mg/dL  Glucose, capillary     Status: Abnormal   Collection Time: 05/30/17  9:37 AM  Result Value Ref Range   Glucose-Capillary 136 (H) 65 - 99 mg/dL  Glucose, capillary     Status: Abnormal   Collection Time: 05/30/17 10:40 AM  Result Value Ref Range   Glucose-Capillary 162 (H) 65 - 99 mg/dL  Basic metabolic panel     Status: Abnormal   Collection Time: 05/30/17 10:43 AM  Result Value Ref Range   Sodium 139 135 - 145 mmol/L   Potassium 4.0 3.5 - 5.1 mmol/L   Chloride 103 101 - 111 mmol/L   CO2 25 22 - 32 mmol/L   Glucose, Bld 170 (H) 65 - 99 mg/dL   BUN 30 (H) 6 - 20 mg/dL   Creatinine, Ser 1.08 (H) 0.44 - 1.00 mg/dL   Calcium 8.6 (L) 8.9 - 10.3 mg/dL   GFR calc non Af Amer 50 (L) >60 mL/min   GFR calc Af Amer 58 (L) >60 mL/min    Comment: (NOTE) The eGFR has been calculated using the CKD EPI equation. This calculation has not been validated in all clinical situations. eGFR's persistently <60 mL/min signify possible Chronic Kidney Disease.    Anion gap 11 5 - 15  Glucose, capillary     Status: Abnormal   Collection Time: 05/30/17 11:32 AM  Result Value Ref Range   Glucose-Capillary 182 (H) 65 - 99 mg/dL  Basic metabolic panel     Status: Abnormal   Collection Time: 05/30/17  3:30 PM  Result Value Ref Range   Sodium  137 135 - 145 mmol/L   Potassium 3.8 3.5 - 5.1 mmol/L   Chloride 103 101 - 111 mmol/L   CO2 22 22 - 32 mmol/L   Glucose, Bld 252 (H) 65 - 99 mg/dL   BUN 31 (H) 6 - 20 mg/dL   Creatinine, Ser 1.12 (H) 0.44 - 1.00 mg/dL   Calcium 8.2 (L) 8.9 - 10.3 mg/dL   GFR calc non Af Amer 48 (L) >60 mL/min   GFR calc Af Amer 55 (L) >60 mL/min    Comment: (NOTE) The eGFR has been calculated using the CKD EPI equation. This calculation has not been validated in all clinical situations. eGFR's persistently <60 mL/min signify possible Chronic Kidney Disease.    Anion gap 12 5 - 15    Studies/Results:  HEAD CT FINDINGS: Brain: There is mild diffuse atrophy. There is slightly greater frontal atrophy, stable. There is no intracranial mass, hemorrhage, extra-axial fluid collection, or midline shift. There is evidence of a prior small lacunar infarct in the periphery of the left mid cerebellum. There is mild small vessel disease in the centra semiovale bilaterally. Elsewhere  gray-white compartments appear normal. No evident acute infarct.  Vascular: No hyperdense vessel. There is calcification in each carotid siphon region.  Skull: Bony calvarium appears intact. There is a small left frontal enostosis without surrounding edema.  Sinuses/Orbits: There is opacification in the left sphenoid sinus. There is opacification of several ethmoid air cells. Other visualized paranasal sinuses are clear. Orbits appear symmetric bilaterally.  Other: Mastoid air cells are clear.  IMPRESSION: Atrophy, most notably in the frontal lobes. No mass or hemorrhage. Mild small vessel disease in the periventricular white matter. 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 No acute infarct evident.  There are foci of arterial vascular calcification. Areas of paranasal sinus disease noted.         Fatiha Guzy A. Jaclyn Herrera, M.D.  Diplomate, Tax adviser of Psychiatry and Neurology (  Neurology). 05/30/2017, 7:37 PM
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
## 2017-05-30 NOTE — H&P (Signed)
TRH H&P    Patient Demographics:    Jaclyn Herrera, is a 71 y.o. female  MRN: [id number]  DOB - 19-Aug-1945  Admit Date - 05/29/2017  Referring MD/NP/PA: Dr. Tomi Bamberger  Outpatient Primary MD for the patient is Asencion Noble, MD  Patient coming from: home  Chief Complaint  Patient presents with  . Hyperglycemia      HPI:    Jaclyn Herrera  is a 71 y.o. female, with history of type II diabetes mellitus, on long-term insulin, GERD, depression, chronic diastolic CHF, hypothyroidism, hypertension, seizure disorder came to hospital after patient had right lower extremity jerking at home for about three minutes. Patient was unable to walk. Patient takes Keppra 250 mg four times a day, she had missed her evening dose.  Patient was started on Keppra after she had a seizure, a year and a half ago. As per patient she fell in a half ago and hit her head on the door jam. Since then she has had chronic headache and had one seizure involving the left upper extremity.  Patient ran out of Humulin R 500, 10 days ago and has been using Humulin  200 samples given to her by her primary care doctor. Today in the ED patient was found to be in DKA, with blood glucose 811, anion gap of 21. Patient started on IV insulin.  She denies chest pain, no shortness of breath no nausea vomiting or diarrhea no dysuria, abdominal pain, did not pass out. No blurred vision no history of tobacco abuse, no alcohol abuse no history of stroke did not have bladder incontinence, did not bite her tongue during the episodes of leg jerking    Review of systems:      All other systems reviewed and are negative.   With Past History of the following :    Past Medical History:  Diagnosis Date  . 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 Arthritis   . Bronchitis   . CHF (congestive heart failure) ([place])   . Chronic diastolic heart  failure ([place]) 07/13/2015  . COPD (chronic obstructive pulmonary disease) ([place])   . Depression   . Diabetes mellitus   . DKA (diabetic ketoacidoses) ([place]) 09/27/2014  . GERD (gastroesophageal reflux disease)   . Headache   . Hyperlipidemia   . Hypertension   . Hypothyroid 10/31/2014  . hypothyroidism   . Pneumonia    hx of   . PONV (postoperative nausea and vomiting)    and slow to wake up after foot surgery   . Seizures ([place])    2017  . Shingles   . Syncope 10/31/2014  . Tubular adenoma of colon   . Vertigo 10/31/2014      Past Surgical History:  Procedure Laterality Date  . ANKLE SURGERY     x 4  . APPENDECTOMY    . CATARACT EXTRACTION Bilateral   . CATARACT EXTRACTION W/ INTRAOCULAR LENS IMPLANT    . CHOLECYSTECTOMY    . COLONOSCOPY W/ BIOPSIES AND POLYPECTOMY    .  ESOPHAGOGASTRODUODENOSCOPY    . HARDWARE REMOVAL Left 11/03/2015   Procedure: HARDWARE REMOVAL LEFT FOOT appliction of wound vac;  Surgeon: Gaynelle Arabian, MD;  Location: [facility];  Service: Orthopedics;  Laterality: Left;  . INCISION AND DRAINAGE OF WOUND Left 11/03/2015   Procedure: IRRIGATION AND DEBRIDEMENT LEFT FOOT ULCER;  Surgeon: Gaynelle Arabian, MD;  Location: [facility];  Service: Orthopedics;  Laterality: Left;  . KNEE ARTHROSCOPY    . NASAL SINUS SURGERY    . non cancerous mass removed     small intestine  . oophrectomy    . ORIF CALCANEOUS FRACTURE Left 12/29/2014   Procedure: OPEN REDUCTION INTERNAL FIXATION  LEFT CALCANEOUS FRACTURE;  Surgeon: Gaynelle Arabian, MD;  Location: [facility];  Service: Orthopedics;  Laterality: Left;  . TONSILLECTOMY        Social History:      Social History   Tobacco Use  . Smoking status: Never Smoker  . Smokeless tobacco: Never Used  Substance Use Topics  . Alcohol use: No       Family History :     Family History  Problem Relation Age of Onset  . Diabetes Mother   . Asthma Mother   . Hypertension Father   . Heart disease Father   . Diabetes Father   . Heart  disease Sister   . Hypertension Sister   . Asthma Sister   . Diabetes Brother   . CAD Brother   . Stroke Brother   . Stomach cancer Maternal Aunt       Home Medications:   Prior to Admission medications   Medication Sig Start Date End Date Taking? Authorizing Provider  acetaminophen (TYLENOL) 500 MG tablet Take 500-1,000 mg by mouth every 6 (six) hours as needed (For pain.).     [provider]  acetaminophen-codeine (TYLENOL #3) 300-30 MG tablet  11/16/16   [provider]  Ascorbic Acid (VITAMIN C PO) Take 2 tablets by mouth daily.    [provider]  carvedilol (COREG) 6.25 MG tablet Take 6.25 mg by mouth 2 (two) times daily. 07/21/16   [provider]  colchicine 0.6 MG tablet Take 1 tablet (0.6 mg total) by mouth daily as needed. DO NOT TAKE WHILE TAKING FLUCONAZOLE 01/11/17   Gatha Mayer, MD  esomeprazole (NEXIUM) 20 MG capsule Take 20 mg by mouth daily.     [provider]  fluticasone (FLONASE) 50 MCG/ACT nasal spray Place 2 sprays into the nose 2 (two) times daily as needed for allergies.  10/28/14   [provider]  HUMULIN R 500 UNIT/ML injection Inject 14 units into the skin twice daily. Inject 9 units into the skin daily at bedtime. 06/13/16   [provider]  ketoconazole (NIZORAL) 2 % cream  01/01/17   [provider]  levETIRAcetam (KEPPRA) 500 MG tablet Take 1 tablet (500 mg total) by mouth 2 (two) times daily. 01/21/16   Asencion Noble, MD  levothyroxine (SYNTHROID, LEVOTHROID) 125 MCG tablet Take 125 mcg by mouth 2 (two) times daily. 06/13/16   [provider]  PARoxetine (PAXIL) 40 MG tablet Take 40 mg by mouth daily.    [provider]  Polyethylene Glycol 400 (BLINK TEARS OP) Place 1 drop into both eyes 4 (four) times daily as needed (Dry eyes).     [provider]  potassium chloride (K-DUR) 10 MEQ tablet TAKE ONE TABLET BY MOUTH ONCE DAILY 10/04/16   Burnell Blanks, MD    quinapril (ACCUPRIL)  40 MG tablet Take 40 mg by mouth daily.     [provider]  Senna-Psyllium (PERDIEM PO) Take 1 tablet by mouth daily as needed (For constipation.).     [provider]  simvastatin (ZOCOR) 20 MG tablet Take 1 tablet (20 mg total) by mouth at bedtime. DO NOT TAKE WHILE TAKING FLUCONAZOLE 01/11/17   Gatha Mayer, MD  torsemide (DEMADEX) 20 MG tablet Take 20 mg by mouth 2 (two) times daily.    [provider]  vitamin B-12 (CYANOCOBALAMIN) 1000 MCG tablet Take 1,000 mcg by mouth daily.    [provider]     Allergies:     Allergies  Allergen Reactions  . Sulfonamide Derivatives Anaphylaxis  . Bactrim [Sulfamethoxazole-Trimethoprim] Nausea Only  . Erythromycin Nausea And Vomiting  . Latex Itching and Other (See Comments)    If she wears gloves too long her hands start itching  . Mucinex [Guaifenesin Er] Nausea And Vomiting  . Aspirin Rash  . Levofloxacin Rash  . Tape Rash     Physical Exam:   Vitals  Blood pressure (!) 178/64, pulse 87, temperature 98.1 F (36.7 C), temperature source Oral, resp. rate 19, height 5\' 8"  (1.727 m), weight 91.2 kg (201 lb), SpO2 98 %.  1.  General: appears in no acute distress  2. Psychiatric:  Intact judgement and  insight, awake alert, oriented x 3.  3. Neurologic: No focal neurological deficits, all cranial nerves intact.Strength 5/5 all 4 extremities, sensation intact all 4 extremities, plantars down going.  4. Eyes :  anicteric sclerae, moist conjunctivae with no lid lag. PERRLA.  5. ENMT:  Oropharynx clear with moist mucous membranes and good dentition  6. Neck:  supple, no cervical lymphadenopathy appriciated, No thyromegaly  7. Respiratory : Normal respiratory effort, good air movement bilaterally,clear to  auscultation bilaterally  8. Cardiovascular : RRR, no gallops, rubs or murmurs, no leg edema  9. Gastrointestinal:  Positive bowel sounds, abdomen soft, non-tender  to palpation,no hepatosplenomegaly, no rigidity or guarding       10. Skin:  No cyanosis, normal texture and turgor, no rash, lesions or ulcers  11.Musculoskeletal:  Good muscle tone,  joints appear normal , no effusions,  normal range of motion    Data Review:    CBC Recent Labs  Lab 05/30/17 0030  WBC 4.6  HGB 11.2*  HCT 33.2*  PLT 156  MCV 83.2  MCH 28.1  MCHC 33.7  RDW 14.9  LYMPHSABS 0.4*  MONOABS 0.2  EOSABS 0.0  BASOSABS 0.0   ------------------------------------------------------------------------------------------------------------------  Chemistries  Recent Labs  Lab 05/30/17 0030  NA 133*  K 5.3*  CL 95*  CO2 17*  GLUCOSE 811*  BUN 36*  CREATININE 1.43*  CALCIUM 8.7*  AST 10*  ALT 12*  ALKPHOS 218*  BILITOT 2.6*   ------------------------------------------------------------------------------------------------------------------  ------------------------------------------------------------------------------------------------------------------ GFR: Estimated Creatinine Clearance: 42 mL/min (A) (by C-G formula based on SCr of 1.43 mg/dL (H)). Liver Function Tests: Recent Labs  Lab 05/30/17 0030  AST 10*  ALT 12*  ALKPHOS 218*  BILITOT 2.6*  PROT 6.9  ALBUMIN 3.8   No results for input(s): LIPASE, AMYLASE in the last 168 hours. No results for input(s): AMMONIA in the last 168 hours. Coagulation Profile: No results for input(s): INR, PROTIME in the last 168 hours. Cardiac Enzymes: No results for input(s): CKTOTAL, CKMB, CKMBINDEX, TROPONINI in the last 168 hours. BNP (last 3 results) No results for input(s): PROBNP in the last 8760 hours. HbA1C: No  results for input(s): HGBA1C in the last 72 hours. CBG: Recent Labs  Lab 05/29/17 2345 05/30/17 0154 05/30/17 0301 05/30/17 0402 05/30/17 0506  GLUCAP >600* >600* >600* 491* 425*   Lipid Profile: No results for input(s): CHOL, HDL, LDLCALC, TRIG, CHOLHDL, LDLDIRECT in the last 72  hours. Thyroid Function Tests: No results for input(s): TSH, T4TOTAL, FREET4, T3FREE, THYROIDAB in the last 72 hours. Anemia Panel: No results for input(s): VITAMINB12, FOLATE, FERRITIN, TIBC, IRON, RETICCTPCT in the last 72 hours.  --------------------------------------------------------------------------------------------------------------- Urine analysis:    Component Value Date/Time   COLORURINE STRAW (A) 05/30/2017 0110   APPEARANCEUR CLEAR 05/30/2017 0110   LABSPEC 1.017 05/30/2017 0110   PHURINE 5.0 05/30/2017 0110   GLUCOSEU >=500 (A) 05/30/2017 0110   HGBUR SMALL (A) 05/30/2017 0110   BILIRUBINUR NEGATIVE 05/30/2017 0110   KETONESUR 80 (A) 05/30/2017 0110   PROTEINUR 30 (A) 05/30/2017 0110   UROBILINOGEN 0.2 12/29/2014 1717   NITRITE NEGATIVE 05/30/2017 0110   LEUKOCYTESUR NEGATIVE 05/30/2017 0110      Imaging Results:    Ct Head Wo Contrast  Result Date: 05/30/2017 CLINICAL DATA:  Altered mental status. Hyperglycemia. Tremors right lower extremity EXAM: CT HEAD WITHOUT CONTRAST TECHNIQUE: Contiguous axial images were obtained from the base of the skull through the vertex without intravenous contrast. COMPARISON:  Head CT January 17, 2016; brain MRI January 18, 2016 FINDINGS: Brain: There is mild diffuse atrophy. There is slightly greater frontal atrophy, stable. There is no intracranial mass, hemorrhage, extra-axial fluid collection, or midline shift. There is evidence of a prior small lacunar infarct in the periphery of the left mid cerebellum. There is mild small vessel disease in the centra semiovale bilaterally. Elsewhere gray-white compartments appear normal. No evident acute infarct. Vascular: No hyperdense vessel. There is calcification in each carotid siphon region. Skull: Bony calvarium appears intact. There is a small left frontal enostosis without surrounding edema. Sinuses/Orbits: There is opacification in the left sphenoid sinus. There is opacification of several  ethmoid air cells. Other visualized paranasal sinuses are clear. Orbits appear symmetric bilaterally. Other: Mastoid air cells are clear. IMPRESSION: Atrophy, most notably in the frontal lobes. No mass or hemorrhage. Mild small vessel disease in the periventricular white matter. 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 No acute infarct evident. There are foci of arterial vascular calcification. Areas of paranasal sinus disease noted. Electronically Signed   By: Lowella Grip III M.D.   On: 05/30/2017 02:40       Assessment & Plan:    Active Problems:   DKA (diabetic ketoacidoses) ([place])   1. Diabetic ketoacidosis-placed under observation, initiate decay protocol.Anion gap is 21, check BMP Q4 hours as per DKA protocol. 2. Leg jerking/seizure-patient started on Keppra 500 mg IV Q 12 hours. Will consult neurology in a.m. 3. Acute kidney injury-creatinine is 1.43, baseline creatinine around 0.86. Likely from poor PO intake, diuretics use at home, start gentle IV hydration with normal saline. Follow BMP  4. Hypothyroidism- continued Synthroid 5. Depression-continue Paxil 6. Hypertension-continue Coreg, Accupril. 7. Hyperlipidemia-continued Zocor 8. Chronic diastolic CHF-currently compensated, holding Demadex due to acute kidney injury    DVT Prophylaxis-   Lovenox   AM Labs Ordered, also please review Full Orders  Family Communication: Admission, patients condition and plan of care including tests being ordered have been discussed with the patient  who indicate understanding and agree with the plan and Code Status.  Code Status:  DNR  Admission status: observation  Time spent in minutes :  60 minutes   Oswald Hillock M.D on 05/30/2017 at 5:27 AM  Between 7am to 7pm - Pager - [phone]. After 7pm go to [email or web address] - password [facility]  [facility] - Office  [phone]

## 2017-05-30 NOTE — Progress Notes (Signed)
EEG completed, results pending. 

## 2017-05-30 NOTE — ED Notes (Signed)
Date and time results received: 05/30/17 0128 (use smartphrase ".now" to insert current time)  Test: Glucose Critical Value: 811  Name of Provider Notified: Dr. Tomi Bamberger @ 0128  Orders Received? Or Actions Taken?: no/na

## 2017-05-30 NOTE — ED Notes (Signed)
Report to Josph Macho, RN ICU

## 2017-05-30 NOTE — Progress Notes (Signed)
PROGRESS NOTE  Jaclyn Herrera SNK:[id number] DOB: 17-Mar-1946 DOA: 05/29/2017 PCP: Asencion Noble, MD  Brief History:  71 year old female with a history of diabetes mellitus, diastolic CHF, hypertension, GERD, seizure disorder admitted with elevated CBGs and jerking of her right lower extremity.  She stated that there was involuntary jerking of the right lower extremity to the point that she was having difficulty walking on it lasting approximately 3 minutes.  In addition, the patient ran out of her Humulin R U500 approximately 10 days prior to this admission.  She stated that she had been using some leftover samples of Humulin U 100 that she previously received from her endocrinologist, Dr. Reynold Bowen.  She complained of some nausea but fevers, chills.  Denied of any abdominal pain, vomiting, diarrhea, upon admission, the patient was noted to have serum glucose of 811 with anion gap of 21.  WBC was 4.6 with hemoglobin 11.2 and platelets 156,000.  The patient was started on intravenous fluids and IV insulin.  Assessment/Plan: DKA type II -patient started on IV insulin with q 1 hour CBG check and q 4 hour BMPs -pt started on aggressive fluid resuscitation -Electrolytes were monitored and repleted -transitioned to Tobias insulin once anion gap closed -diet was advanced once anion gap closed -HbA1C--pending -Case was discussed with her endocrinologist Dr. Forde Dandy as the patient is not able to afford Humulin U 500-->can go home with long acting glargine  Right lower extremity myoclonus -Suspect that this is likely myoclonus secondary to the patient's metabolic derangement -Keppra level pending -Patient states that she has been taking Keppra 250 mg 4 times daily as opposed to the prescribed dose of 500 mg twice daily because it was making her too sleepy -EEG results pending -Neurology consultation -Continue Keppra   AKI -Baseline creatinine is 0.8-1.1 -Presenting creatinine 1.43 -Secondary  to volume depletion -Improving with IV fluids -holding accupril  Chronic diastolic CHF -She is clinically euvolemic -Daily weights -Holding torsemide secondary to AKI  Essential hypertension -Continue carvedilol -Holding accupril due to AKI  Seizure disorder -Continue home dose Keppra  Hyperlipidemia -Continue statin  Hyperbilirubinemia -resolved -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  Greater than 50% spent face to face counseling and coordinating care. 1730 to 1805  Consultants:  neurology  Code Status:  FULL  DVT Prophylaxis:  [place] Lovenox   Procedures: As Listed in Progress Note Above  Antibiotics: None    Subjective: Patient denies fevers, chills, headache, chest pain, dyspnea, nausea, vomiting, diarrhea, abdominal pain, dysuria, hematuria, hematochezia, and melena.   Objective: Vitals:   05/30/17 1300 05/30/17 1400 05/30/17 1500 05/30/17 1600  BP: (!) 130/49 (!) 131/46 (!) 134/49 (!) 151/49  Pulse: 64 67 68 70  Resp: 17 18 16 13   Temp:      TempSrc:      SpO2: 97% 97% 96% 99%  Weight:      Height:        Intake/Output Summary (Last 24 hours) at 05/30/2017 1800 Last data filed at 05/30/2017 1415 Gross per 24 hour  Intake 2433.6 ml  Output 700 ml  Net 1733.6 ml   Weight change:  Exam:   General:  Pt is alert, follows commands appropriately, not in acute distress  HEENT: No icterus, No thrush, No neck mass, /AT  Cardiovascular: RRR, S1/S2, no rubs, no gallops  Respiratory: CTA bilaterally, no wheezing, no crackles, no rhonchi  Abdomen: Soft/+BS, non tender, non distended, no guarding  Extremities: No edema, No lymphangitis, No petechiae, No rashes, no synovitis   Data Reviewed: I have personally reviewed following labs and imaging studies Basic Metabolic Panel: Recent Labs  Lab 05/30/17 0030 05/30/17 0706  05/30/17 1043 05/30/17 1530  NA 133* 140 139 137  K 5.3* 3.9 4.0 3.8  CL 95* 103 103 103  CO2 17* 24 25 22   GLUCOSE 811* 233* 170* 252*  BUN 36* 30* 30* 31*  CREATININE 1.43* 1.15* 1.08* 1.12*  CALCIUM 8.7* 8.8* 8.6* 8.2*   Liver Function Tests: Recent Labs  Lab 05/30/17 0030 05/30/17 0708  AST 10*  --   ALT 12*  --   ALKPHOS 218*  --   BILITOT 2.6* 0.9  PROT 6.9  --   ALBUMIN 3.8  --    No results for input(s): LIPASE, AMYLASE in the last 168 hours. No results for input(s): AMMONIA in the last 168 hours. Coagulation Profile: No results for input(s): INR, PROTIME in the last 168 hours. CBC: Recent Labs  Lab 05/30/17 0030  WBC 4.6  NEUTROABS 3.9  HGB 11.2*  HCT 33.2*  MCV 83.2  PLT 156   Cardiac Enzymes: No results for input(s): CKTOTAL, CKMB, CKMBINDEX, TROPONINI in the last 168 hours. BNP: Invalid input(s): POCBNP CBG: Recent Labs  Lab 05/30/17 0721 05/30/17 0829 05/30/17 0937 05/30/17 1040 05/30/17 1132  GLUCAP 208* 147* 136* 162* 182*   HbA1C: No results for input(s): HGBA1C in the last 72 hours. Urine analysis:    Component Value Date/Time   COLORURINE STRAW (A) 05/30/2017 0110   APPEARANCEUR CLEAR 05/30/2017 0110   LABSPEC 1.017 05/30/2017 0110   PHURINE 5.0 05/30/2017 0110   GLUCOSEU >=500 (A) 05/30/2017 0110   HGBUR SMALL (A) 05/30/2017 0110   BILIRUBINUR NEGATIVE 05/30/2017 0110   KETONESUR 80 (A) 05/30/2017 0110   PROTEINUR 30 (A) 05/30/2017 0110   UROBILINOGEN 0.2 12/29/2014 1717   NITRITE NEGATIVE 05/30/2017 0110   LEUKOCYTESUR NEGATIVE 05/30/2017 0110   Sepsis Labs: @LABRCNTIP (procalcitonin:4,lacticidven:4) )No results found for this or any previous visit (from the past 240 hour(s)).   Scheduled Meds: . carvedilol  6.25 mg Oral BID  . enoxaparin (LOVENOX) injection  40 mg Subcutaneous Q24H  . insulin aspart  0-15 Units Subcutaneous TID WC  . insulin aspart  0-5 Units Subcutaneous QHS  . insulin glargine  20 Units Subcutaneous  Daily  . levothyroxine  125 mcg Oral QAC breakfast  . lisinopril  40 mg Oral Daily  . pantoprazole  40 mg Oral Daily  . PARoxetine  40 mg Oral Daily  . simvastatin  20 mg Oral QHS  . vitamin B-12  1,000 mcg Oral Daily   Continuous Infusions: . sodium chloride 100 mL/hr at 05/30/17 1221  . levETIRAcetam Stopped (05/30/17 1448)    Procedures/Studies: Ct Head Wo Contrast  Result Date: 05/30/2017 CLINICAL DATA:  Altered mental status. Hyperglycemia. Tremors right lower extremity EXAM: CT HEAD WITHOUT CONTRAST TECHNIQUE: Contiguous axial images were obtained from the base of the skull through the vertex without intravenous contrast. COMPARISON:  Head CT January 17, 2016; brain MRI January 18, 2016 FINDINGS: Brain: There is mild diffuse atrophy. There is slightly greater frontal atrophy, stable. There is no intracranial mass, hemorrhage, extra-axial fluid collection, or midline shift. There is evidence of a prior small lacunar infarct in the periphery of the left mid cerebellum. There is mild small vessel disease  in the centra semiovale bilaterally. Elsewhere gray-white compartments appear normal. No evident acute infarct. Vascular: No hyperdense vessel. There is calcification in each carotid siphon region. Skull: Bony calvarium appears intact. There is a small left frontal enostosis without surrounding edema. Sinuses/Orbits: There is opacification in the left sphenoid sinus. There is opacification of several ethmoid air cells. Other visualized paranasal sinuses are clear. Orbits appear symmetric bilaterally. Other: Mastoid air cells are clear. IMPRESSION: Atrophy, most notably in the frontal lobes. No mass or hemorrhage. Mild small vessel disease in the periventricular white matter. 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 No acute infarct evident. There are foci of arterial vascular calcification. Areas of paranasal sinus disease noted. Electronically Signed   By: Lowella Grip  III M.D.   On: 05/30/2017 02:40    Orson Eva, DO  [facility] Pager [phone]  If 7PM-7AM, please contact night-coverage [email or web address] Password [email or web address] 05/30/2017, 6:00 PM   LOS: 0 days

## 2017-05-31 ENCOUNTER — Other Ambulatory Visit: Payer: Self-pay | Admitting: *Deleted

## 2017-05-31 ENCOUNTER — Encounter: Payer: Self-pay | Admitting: *Deleted

## 2017-05-31 DIAGNOSIS — N179 Acute kidney failure, unspecified: Secondary | ICD-10-CM | POA: Diagnosis not present

## 2017-05-31 DIAGNOSIS — E1110 Type 2 diabetes mellitus with ketoacidosis without coma: Secondary | ICD-10-CM | POA: Diagnosis not present

## 2017-05-31 DIAGNOSIS — Z9114 Patient's other noncompliance with medication regimen: Secondary | ICD-10-CM | POA: Diagnosis not present

## 2017-05-31 DIAGNOSIS — G40109 Localization-related (focal) (partial) symptomatic epilepsy and epileptic syndromes with simple partial seizures, not intractable, without status epilepticus: Secondary | ICD-10-CM | POA: Diagnosis not present

## 2017-05-31 DIAGNOSIS — I5032 Chronic diastolic (congestive) heart failure: Secondary | ICD-10-CM | POA: Diagnosis not present

## 2017-05-31 DIAGNOSIS — E1165 Type 2 diabetes mellitus with hyperglycemia: Secondary | ICD-10-CM | POA: Diagnosis not present

## 2017-05-31 LAB — BASIC METABOLIC PANEL
Anion gap: 10 (ref 5–15)
BUN: 28 mg/dL — ABNORMAL HIGH (ref 6–20)
CALCIUM: 8.4 mg/dL — AB (ref 8.9–10.3)
CHLORIDE: 102 mmol/L (ref 101–111)
CO2: 22 mmol/L (ref 22–32)
CREATININE: 1.14 mg/dL — AB (ref 0.44–1.00)
GFR, EST AFRICAN AMERICAN: 54 mL/min — AB (ref >60)
GFR, EST NON AFRICAN AMERICAN: 47 mL/min — AB (ref >60)
Glucose, Bld: 398 mg/dL — ABNORMAL HIGH (ref 65–99)
Potassium: 4.1 mmol/L (ref 3.5–5.1)
SODIUM: 134 mmol/L — AB (ref 135–145)

## 2017-05-31 LAB — CK: Total CK: 21 U/L — ABNORMAL LOW (ref 38–234)

## 2017-05-31 LAB — GLUCOSE, CAPILLARY
GLUCOSE-CAPILLARY: 386 mg/dL — AB (ref 65–99)
GLUCOSE-CAPILLARY: 361 mg/dL — AB (ref 65–99)

## 2017-05-31 LAB — MAGNESIUM: MAGNESIUM: 2.2 mg/dL (ref 1.7–2.4)

## 2017-05-31 LAB — VITAMIN B12: VITAMIN B 12: 1121 pg/mL — AB (ref 180–914)

## 2017-05-31 LAB — HEMOGLOBIN A1C
HEMOGLOBIN A1C: 9.6 % — AB (ref 4.8–5.6)
MEAN PLASMA GLUCOSE: 228.82 mg/dL

## 2017-05-31 MED ORDER — INSULIN GLARGINE 100 UNIT/ML ~~LOC~~ SOLN
35.0000 [IU] | Freq: Every day | SUBCUTANEOUS | Status: DC
  Filled 2017-05-31: qty 0.35

## 2017-05-31 MED ORDER — INSULIN ASPART 100 UNIT/ML ~~LOC~~ SOLN
0.0000 [IU] | Freq: Three times a day (TID) | SUBCUTANEOUS | Status: DC
  Administered 2017-05-31: 20 [IU] via SUBCUTANEOUS

## 2017-05-31 MED ORDER — INSULIN ASPART 100 UNIT/ML ~~LOC~~ SOLN: 0.0000 [IU] | Freq: Every day | SUBCUTANEOUS | Status: DC

## 2017-05-31 MED ORDER — INSULIN GLARGINE 100 UNIT/ML ~~LOC~~ SOLN
15.0000 [IU] | Freq: Once | SUBCUTANEOUS | Status: AC
  Administered 2017-05-31: 15 [IU] via SUBCUTANEOUS
  Filled 2017-05-31: qty 0.15

## 2017-05-31 MED ORDER — INSULIN ASPART 100 UNIT/ML ~~LOC~~ SOLN
5.0000 [IU] | Freq: Three times a day (TID) | SUBCUTANEOUS | Status: DC
  Administered 2017-05-31: 5 [IU] via SUBCUTANEOUS

## 2017-05-31 NOTE — Progress Notes (Signed)
Patient is to be discharged home and in stable condition. IV removed, WNL. Patient given discharge instructions and verbalized understanding. Patient will be escorted out by staff via wheelchair upon arrival of transportation.  Celestia Khat, RN

## 2017-05-31 NOTE — Consult Note (Addendum)
   [facility] CM Inpatient Consult   05/31/2017  Jaclyn Herrera 1945/07/21 [id number]   [facility] Care Management referral received.  Spoke with [name] via phone, prior to hospital discharge to discuss [place] Management program services. She is agreeable and verbal consent obtained.  [name] reports she has issues with affording her insulin. States the insulin costs 500.00 and that she is in the doughnut hole. [name] states she will pick her insulin up today at [facility] in [place] however. Discussed [facility] Pharmacist referral. However, made [name] aware that [facility] Pharmacist unable to help with being in the doughnut hole. She expresses understanding.   Confirmed best contact number as [phone]. Confirmed Primary Care MD is Dr. Willey Blade. Lives alone.  Denies having concerns with transportation.   Will also make referral to [place] for transition of care for DM management and [facility] Pharmacist for medication affordability.   Will make inpatient RNCM aware [place] Management will follow.    Marthenia Rolling, MSN-Ed, RN,BSN [facility] Liaison [phone]
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
## 2017-05-31 NOTE — Discharge Summary (Addendum)
Physician Discharge Summary  Jaclyn Herrera:[id number] DOB: 16-Apr-1946 DOA: 05/29/2017  PCP: Asencion Noble, MD  Admit date: 05/29/2017 Discharge date: 05/31/2017  Admitted From: Home Disposition:  Home   Recommendations for Outpatient Follow-up:  1. Follow up with PCP in 1-2 weeks 2. Please obtain BMP/CBC in one week    Discharge Condition: Stable CODE STATUS: FULL Diet recommendation: Heart Healthy / Carb Modified  Brief/Interim Summary: 71 year old female with a history of diabetes mellitus, diastolic CHF, hypertension, GERD, seizure disorder admitted with elevated CBGs and jerking of her right lower extremity.  She stated that there was involuntary jerking of the right lower extremity to the point that she was having difficulty walking on it lasting approximately 3 minutes.  In addition, the patient ran out of her Humulin R U500 approximately 10 days prior to this admission.  She stated that she had been using some leftover samples of Humulin U 100 that she previously received from her endocrinologist, Dr. Reynold Bowen.  She complained of some nausea but fevers, chills.  Denied of any abdominal pain, vomiting, diarrhea, upon admission, the patient was noted to have serum glucose of 811 with anion gap of 21.  WBC was 4.6 with hemoglobin 11.2 and platelets 156,000.  The patient was started on intravenous fluids and IV insulin.  She improved clinically and was transitioned to subcutaneous insulin.  The patient was discussed with her endocrinologist, Dr. Reynold Bowen.  He assisted with recommendations for discharge of the patient with long-acting insulin.  This was discussed with the patient.  However, on the day of discharge, the patient stated that she will be receiving her "benefits check", and therefore will have the financial means to afford her Humulin U500.  The patient was adamant about not going home with long acting insulin glargine.  Therefore, the patient will be discharged home  with her preadmission dose of humulin R 500U.  She was encourage to follow up with Dr. Forde Dandy soon after d/c.    Discharge Diagnoses:  DKA type II -patient started on IV insulin with q 1 hour CBG check and q 4 hour BMPs -pt started on aggressive fluid resuscitation -Electrolytes were monitored and repleted -transitioned to [place] insulin once anion gap closed -diet was advanced once anion gap closed -HbA1C--at time of d/c -Case was discussed with her endocrinologist Dr. Forde Dandy as the patient is not able to afford HumulinR- U 500-->can go home with long acting glargine -However, on the day of discharge, the patient stated that she would be getting her money from a "benefits check"--therefore she was adamant that she wanted to go home on her previous dosing of Humulin R U500  Right lower extremity myoclonus -Suspect that this is likely myoclonus secondary to the patient's metabolic derangement -Keppra level pending -Patient states that she has been taking Keppra 250 mg 4 times daily as opposed to the prescribed dose of 500 mg twice daily because it was making her too sleepy -EEG results pending -Neurology consultation--felt pt had  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  Outpatient referral will be made to epileptology  AKI -Baseline creatinine is 0.8-1.1 -Presenting creatinine 1.43 -Secondary to volume depletion -Improving with IV fluids -holding accupril--> restart after discharge -Serum creatinine 1.08 at the time of discharge.  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 -no abdominal pain      Discharge Instructions  Discharge Instructions    Ambulatory referral to Neurology   Complete by:  As directed    Refer to [facility]--??simple partial seizure   Diet - low sodium heart healthy   Complete by:  As directed    Increase activity slowly   Complete by:  As directed      Allergies as of 05/31/2017      Reactions   Sulfonamide Derivatives Anaphylaxis   Bactrim [sulfamethoxazole-trimethoprim] Nausea Only   Erythromycin Nausea And Vomiting   Latex Itching, Other (See Comments)   If she wears gloves too long her hands start itching   Mucinex [guaifenesin Er] Nausea And Vomiting   Aspirin Rash   Levofloxacin Rash   Tape Rash      Medication List    TAKE these medications   acetaminophen 500 MG tablet Commonly known as:  TYLENOL Take 500-1,000 mg by mouth every 6 (six) hours as needed (For pain.).   acetaminophen-codeine 300-30 MG tablet Commonly known as:  TYLENOL #3   BLINK TEARS OP Place 1 drop into both eyes 4 (four) times daily as needed (Dry eyes).   carvedilol 6.25 MG tablet Commonly known as:  COREG Take 6.25 mg by mouth 2 (two) times daily.   colchicine 0.6 MG tablet Take 1 tablet (0.6 mg total) by mouth daily as needed. DO NOT TAKE WHILE TAKING FLUCONAZOLE   esomeprazole 20 MG capsule Commonly known as:  NEXIUM Take 20 mg by mouth daily.   fluticasone 50 MCG/ACT nasal spray Commonly known as:  FLONASE Place 2 sprays into the nose 2 (two) times daily as needed for allergies.   HUMULIN R 500 UNIT/ML injection Generic drug:  insulin regular human CONCENTRATED Inject 14 units into the skin twice daily. Inject 9 units into the skin daily at bedtime.   levETIRAcetam 500 MG tablet Commonly known as:  KEPPRA Take 1 tablet (500 mg total) by mouth 2 (two) times daily.   levothyroxine 125  MCG tablet Commonly known as:  SYNTHROID, LEVOTHROID Take 125 mcg by mouth daily before breakfast.   PARoxetine 40 MG tablet Commonly known as:  PAXIL Take 40 mg by mouth daily.   PERDIEM PO Take 1 tablet by mouth daily as needed (For constipation.).   potassium chloride 10 MEQ tablet Commonly known as:  K-DUR TAKE ONE TABLET BY MOUTH ONCE DAILY   quinapril 40 MG tablet Commonly known as:  ACCUPRIL Take 40 mg by mouth daily.   simvastatin 20 MG tablet Commonly known as:  ZOCOR Take 1 tablet (20 mg total) by mouth at bedtime. DO NOT TAKE WHILE TAKING FLUCONAZOLE   torsemide 20 MG tablet Commonly known as:  DEMADEX Take 20 mg by mouth 2 (two) times daily.   vitamin B-12 1000 MCG tablet Commonly known as:  CYANOCOBALAMIN Take 1,000 mcg by mouth daily.   VITAMIN C PO Take 2 tablets by mouth daily.       Allergies  Allergen Reactions  . Sulfonamide Derivatives Anaphylaxis  . Bactrim [Sulfamethoxazole-Trimethoprim] Nausea Only  . Erythromycin Nausea And Vomiting  . Latex Itching and Other (See Comments)    If she wears gloves too long her hands start itching  . Mucinex [Guaifenesin Er] Nausea And Vomiting  .  Aspirin Rash  . Levofloxacin Rash  . Tape Rash    Consultations:  neurology   Procedures/Studies: Ct Head Wo Contrast  Result Date: 05/30/2017 CLINICAL DATA:  Altered mental status. Hyperglycemia. Tremors right lower extremity EXAM: CT HEAD WITHOUT CONTRAST TECHNIQUE: Contiguous axial images were obtained from the base of the skull through the vertex without intravenous contrast. COMPARISON:  Head CT January 17, 2016; brain MRI January 18, 2016 FINDINGS: Brain: There is mild diffuse atrophy. There is slightly greater frontal atrophy, stable. There is no intracranial mass, hemorrhage, extra-axial fluid collection, or midline shift. There is evidence of a prior small lacunar infarct in the periphery of the left mid cerebellum. There is mild small vessel disease in  the centra semiovale bilaterally. Elsewhere gray-white compartments appear normal. No evident acute infarct. Vascular: No hyperdense vessel. There is calcification in each carotid siphon region. Skull: Bony calvarium appears intact. There is a small left frontal enostosis without surrounding edema. Sinuses/Orbits: There is opacification in the left sphenoid sinus. There is opacification of several ethmoid air cells. Other visualized paranasal sinuses are clear. Orbits appear symmetric bilaterally. Other: Mastoid air cells are clear. IMPRESSION: Atrophy, most notably in the frontal lobes. No mass or hemorrhage. Mild small vessel disease in the periventricular white matter. 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 No acute infarct evident. There are foci of arterial vascular calcification. Areas of paranasal sinus disease noted. Electronically Signed   By: Lowella Grip III M.D.   On: 05/30/2017 02:40        Discharge Exam: Vitals:   05/31/17 0600 05/31/17 1000  BP: (!) 150/43 (!) 125/42  Pulse: 71 76  Resp:  18  Temp: 98.8 F (37.1 C) 98.6 F (37 C)  SpO2: 98% 98%   Vitals:   05/30/17 2200 05/31/17 0200 05/31/17 0600 05/31/17 1000  BP: (!) 133/50 (!) 156/49 (!) 150/43 (!) 125/42  Pulse: 78 71 71 76  Resp: 16 18  18   Temp: 98.2 F (36.8 C) 98.3 F (36.8 C) 98.8 F (37.1 C) 98.6 F (37 C)  TempSrc: Oral Oral Oral Oral  SpO2: 99% 99% 98% 98%  Weight:      Height:        General: Pt is alert, awake, not in acute distress Cardiovascular: RRR, S1/S2 +, no rubs, no gallops Respiratory: CTA bilaterally, no wheezing, no rhonchi Abdominal: Soft, NT, ND, bowel sounds + Extremities: no edema, no cyanosis   The results of significant diagnostics from this hospitalization (including imaging, microbiology, ancillary and laboratory) are listed below for reference.    Significant Diagnostic Studies: Ct Head Wo Contrast  Result Date: 05/30/2017 CLINICAL DATA:   Altered mental status. Hyperglycemia. Tremors right lower extremity EXAM: CT HEAD WITHOUT CONTRAST TECHNIQUE: Contiguous axial images were obtained from the base of the skull through the vertex without intravenous contrast. COMPARISON:  Head CT January 17, 2016; brain MRI January 18, 2016 FINDINGS: Brain: There is mild diffuse atrophy. There is slightly greater frontal atrophy, stable. There is no intracranial mass, hemorrhage, extra-axial fluid collection, or midline shift. There is evidence of a prior small lacunar infarct in the periphery of the left mid cerebellum. There is mild small vessel disease in the centra semiovale bilaterally. Elsewhere gray-white compartments appear normal. No evident acute infarct. Vascular: No hyperdense vessel. There is calcification in each carotid siphon region. Skull: Bony calvarium appears intact. There is a small left frontal enostosis without surrounding edema. Sinuses/Orbits: There is opacification in the left sphenoid  sinus. There is opacification of several ethmoid air cells. Other visualized paranasal sinuses are clear. Orbits appear symmetric bilaterally. Other: Mastoid air cells are clear. IMPRESSION: Atrophy, most notably in the frontal lobes. No mass or hemorrhage. Mild small vessel disease in the periventricular white matter. 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 No acute infarct evident. There are foci of arterial vascular calcification. Areas of paranasal sinus disease noted. Electronically Signed   By: Lowella Grip III M.D.   On: 05/30/2017 02:40     Microbiology: Recent Results (from the past 240 hour(s))  MRSA PCR Screening     Status: Abnormal   Collection Time: 05/30/17  6:25 AM  Result Value Ref Range Status   MRSA by PCR POSITIVE (A) NEGATIVE Final    Comment:        The GeneXpert MRSA Assay (FDA approved for NASAL specimens only), is one component of a comprehensive MRSA colonization surveillance program. It is  not intended to diagnose MRSA infection nor to guide or monitor treatment for MRSA infections. RESULT CALLED TO, READ BACK BY AND VERIFIED WITH: VILLALBOS,S@2255  BY MATTHEWS, B 12.19.18      Labs: Basic Metabolic Panel: Recent Labs  Lab 05/30/17 0030 05/30/17 0706 05/30/17 1043 05/30/17 1530 05/31/17 0458  NA 133* 140 139 137 134*  K 5.3* 3.9 4.0 3.8 4.1  CL 95* 103 103 103 102  CO2 17* 24 25 22 22   GLUCOSE 811* 233* 170* 252* 398*  BUN 36* 30* 30* 31* 28*  CREATININE 1.43* 1.15* 1.08* 1.12* 1.14*  CALCIUM 8.7* 8.8* 8.6* 8.2* 8.4*  MG  --   --   --   --  2.2   Liver Function Tests: Recent Labs  Lab 05/30/17 0030 05/30/17 0708  AST 10*  --   ALT 12*  --   ALKPHOS 218*  --   BILITOT 2.6* 0.9  PROT 6.9  --   ALBUMIN 3.8  --    No results for input(s): LIPASE, AMYLASE in the last 168 hours. No results for input(s): AMMONIA in the last 168 hours. CBC: Recent Labs  Lab 05/30/17 0030  WBC 4.6  NEUTROABS 3.9  HGB 11.2*  HCT 33.2*  MCV 83.2  PLT 156   Cardiac Enzymes: Recent Labs  Lab 05/31/17 0458  CKTOTAL 21*   BNP: Invalid input(s): POCBNP CBG: Recent Labs  Lab 05/30/17 1040 05/30/17 1132 05/30/17 2156 05/31/17 0735 05/31/17 1111  GLUCAP 162* 182* 368* 386* 361*    Time coordinating discharge:  Greater than 30 minutes  Signed:  Orson Eva, DO [facility] Pager: [phone] 05/31/2017, 11:24 AM

## 2017-05-31 NOTE — Patient Outreach (Signed)
[facility]) Care Management  05/31/2017  Jaclyn Herrera [birth year] [id number]   Jaclyn Herrera is a 71 year old female patient with medical history which includes diabetes mellitus, diastolic CHF, hypertension, GERD, and seizure disorder. She was admitted to the hospital via ED when she presented with elevated CBGs (>800) and jerking of her right lower extremity. She was treated for DKA and AKI initially with IV insulin and aggressive fluid resuscitation and transitioned to subcutaneous insulin once stabilized. Her myoclonus was determined to be secondary to metabolic derangement and resolved after treatment.   Jaclyn Herrera was referred to [place] [facility] for transition of care services, education and care coordination around chronic disease management, and medication management and assistance.   Medication Management - Jaclyn Herrera relates that she ran out of her Humulin R U500approximately 10 days prior to admitting to the hospital because she could not afford the $500 out of pocket expense. She is in the donut hole and is requesting assistance with medications. Her endocrinologist is Dr. Reynold Bowen recommended a long acting insulin but Jaclyn Herrera declined stating she would be receiving her benefits check and would be able to afford the Humulin R U500. She was encouraged to follow up with Dr. Forde Dandy soon after d/c.  In addition to non adherence with her insulin regimen, Jaclyn Herrera reported that she has been taking Keppra 240mg  QID as opposed to the prescribed 500mg  BID because the prescribed dose was making her too sleepy.   Plan: I will outreach to Jaclyn Herrera after her hospital discharge to initiate transition of care services.    [facility]  [phone]

## 2017-05-31 NOTE — Care Management Obs Status (Signed)
[place] NOTIFICATION   Patient Details  Name: Jaclyn Herrera MRN: [id number] Date of Birth: December 15, 1945   Medicare Observation Status Notification Given:  Yes    Denielle Bayard, Chauncey Reading, RN 05/31/2017, 9:56 AM

## 2017-05-31 NOTE — Progress Notes (Addendum)
Inpatient Diabetes Program Recommendations  AACE/ADA: New Consensus Statement on Inpatient Glycemic Control (2015)  Target Ranges:  Prepandial:   less than 140 mg/dL      Peak postprandial:   less than 180 mg/dL (1-2 hours)      Critically ill patients:  140 - 180 mg/dL   Lab Results  Component Value Date   GLUCAP 386 (H) 05/31/2017   HGBA1C 9.3 (H) 11/01/2015    Review of Glycemic Control  Diabetes history: DM2 Outpatient Diabetes medications: Humulin R U500 9 units with breakfast 15 units with supper (draws up from vial with U100 syringe so dose is actually 45 units with breakfast and 75 units with supper) Current orders for Inpatient glycemic control: Lantus 20 units daily, Novolog 0-15 units TID with meals, Novolog 0-5 units QHS  Inpatient Diabetes Program Recommendations:   Insulin - Basal: Fasting glucose 386 mg/dl today. Please consider increasing Lantus to 30 units daily. Correction (SSI): Please consider increasing Novolog correction to Resistant correction scale. Insulin - Meal Coverage: Please consider ordering Novolog 8 units TID with meals for meal coverage if patient eats at least 50% of meals. Outpatient DM medications: Since patient can not afford Humulin R U500, recommend patient be discharged on Lantus and Novolog insulin (similar regimen to inpatient insulin orders if glucose is fairly well controlled on the regimen). Recommend patient follow up with Dr. Forde Dandy as soon as possible for assistance with DM management.  NOTE: Per H&P, "Patient ran out of Humulin R 500, 10 days ago and has been using Humulin  200 samples given to her by her primary care doctor." Per Dr. Doristine Devoid progress note for 05/30/17 "Case was discussed with her endocrinologist Dr. Forde Dandy as the patient is not able to afford Humulin U 500-->can go home with long acting glargine".   Addendum 05/31/17@13 :37-Spoke with patient about diabetes and home regimen for diabetes control. Patient reports that she is  followed by Dr. Forde Dandy for diabetes management. She ran out of Humulin R U500 about 10 days prior to admission. Patient states that she went to get refill and it was $548 copay since she was in the doughnut hole.  Patient reports that she recently had a nephew pass away and she had to pay for his cremation and she did not have the extra money to pay for the U500. Patient states that she got paid today and she plans to go to [facility] and pick up the U500 and pay the $548 copay. Inquired whether patient has ever applied for any type of medication assistance with the manufacture of U500 and patient reports that she has applied in the past but she does not qualify for any type of assistance with them. Patient states she knows the U500 insulin works for her so she is going to stay on it for now but plans to discuss further with Dr. Forde Dandy at her next appointment.  Patient verbalized understanding of information discussed and he states that he has no further questions at this time related to diabetes. Patient is being discharged home today.  Thanks, Barnie Alderman, RN, MSN, CDE Diabetes Coordinator Inpatient Diabetes Program [phone] (Team Pager from 8am to 5pm)

## 2017-06-01 ENCOUNTER — Other Ambulatory Visit: Payer: Self-pay | Admitting: *Deleted

## 2017-06-01 LAB — LEVETIRACETAM LEVEL: LEVETIRACETAM: 2.7 ug/mL — AB (ref 10.0–40.0)

## 2017-06-01 NOTE — Patient Outreach (Signed)
[facility]) Care Management  06/01/2017  Jaclyn Herrera [birth year] [id number]  Jaclyn Herrera is a 71 year old female patient with medical history which includes diabetes mellitus, diastolic CHF, hypertension, GERD, and seizure disorder. She was admitted to the hospital via ED when she presented with elevated CBGs (>800) and jerking of her right lower extremity. She was treated for DKA and AKI initially with IV insulin and aggressive fluid resuscitation and transitioned to subcutaneous insulin once stabilized. Her myoclonus was determined to be secondary to metabolic derangement and resolved after treatment.   Jaclyn Herrera was referred to [place] Case Management for transition of care services, education and care coordination around chronic disease management, and medication management and assistance.   Medication Management - Jaclyn Herrera relates that she ran out of her Humulin R U500approximately 10 days prior to admitting to the hospital because she could not afford the $500 out of pocket expense. She is in the donut hole and is requesting assistance with medications. Her endocrinologist is Dr. Reynold Bowen recommended a long acting insulin but Jaclyn Herrera declined stating she would be receiving her benefits check and would be able to afford the Humulin R U500. She was encouraged to follow up with Dr. Forde Dandy soon after d/c.  In addition to non adherence with her insulin regimen, Jaclyn Herrera reported that she has been taking Keppra 240mg  QID as opposed to the prescribed 500mg  BID because the prescribed dose was making her too sleepy.   I called Jaclyn Herrera today to initiate transition of care services and review her care management and care coordination needs. She tells me she feels this entire health incident is related to stress in her personal relationships. She reports that her nephew died unexpectedly and she has other family issues and she "didn't take care of my  health" in her normal fashion.   We reviewed Jaclyn Herrera medications and she reports that she has and is taking as prescribed all medications including insulin. Her fasting cbg this morning was 156 and she has eaten breakfast and lunch and has her '"regular" evening meal planned. Jaclyn Herrera does not yet have a follow up appointment scheduled with Dr. Willey Herrera but states she will call his office next week after Christmas to schedule a follow up.   Plan: Jaclyn Herrera agreed to [place] Management services and said she would allow me to call her to follow up on her progress next week. She did report that she is having problems with her cell phone and asked me not to leave a message if I didn't reach her.   [facility] CM Care Plan Problem One     Most Recent Value  Care Plan Problem One  Knowledge Deficits related to long term management of DMII  Role Documenting the Problem One  Care Management [place] for Problem One  Active  [facility] Long Term Goal   Over the next 60 days, patient will demonstrate understanding of long term plan of care for management of DMII   [facility] Long Term Goal Start Date  06/01/17  Interventions for Problem One Long Term Goal  reviewed discharge instructions and plan of care with patient,  discussed prescribed diet and glucose monitoring recommendations, medications reviewed  [facility] CM Short Term Goal #1   Over the next 30 days, patient will check cbg's daily and record  [facility] CM Short Term Goal #1 Start Date  06/01/17  Interventions for Short Term Goal #1  reviewed  cbg monitoring recommendations and provider established parameters for cbg findings  [facility] CM Short Term Goal #2   Over the next 30 days, patient will take all medications as prescribed  [facility] CM Short Term Goal #2 Start Date  06/01/17  Interventions for Short Term Goal #2  medications reviewed with patient  [facility] CM Short Term Goal #3  Over the next 30 days, patient will report signs and symptoms of hyper and hypoglycemia  [facility]  CM Short Term Goal #3 Start Date  06/01/17  Interventions for Short Tern Goal #3  Emmi educational materials prescribed,  discussed signs and symptoms of hypo and hyperglycemia     [place] Care Management  [phone]

## 2017-06-06 ENCOUNTER — Other Ambulatory Visit: Payer: Self-pay | Admitting: *Deleted

## 2017-06-06 NOTE — Patient Outreach (Signed)
[place] [facility]) Care Management  06/06/2017  TANIKA BRACCO 05-Oct-1945 [id number]  I reached out to Ms. Veltre by phone today in follow up and for continued transition of care services.  Ms. Domenico reports that her cbg's have been "more stable". She says that her highest blood sugar reading over the last week has been 217. She reports being "more careful" about what she is eating and confirms that she is using the "sheet they gave me at the hospital" as a guideline for food choices and to confirm that she is correctly taking her medications each day. She reiterates that she feels "stress" led to her "getting the sugar out of control" and being hospitalized. She says that the relationship and family concerns that led to the stress are better and she feels she has done a better job of managing how she responds to such stressors.   Ms. Gasparyan agreed to allow me to call her again next week but doesn't feel at this time that she needs home visits.   Plan: I will reach out to Ms. Chaney again next week for continued transition of care assessments and support.    [place] [facility]  [phone]

## 2017-06-11 ENCOUNTER — Other Ambulatory Visit: Payer: Self-pay | Admitting: Pharmacist

## 2017-06-11 NOTE — Patient Outreach (Addendum)
[place] [facility]) Care Management  06/11/2017  Jaclyn Herrera 04-25-1946 [id number]  Patient was referred to [facility] Pharmacist by [place] for medication patient assistance evaluation for Humulin U-500 insulin.   Unsuccessful phone outreach to patient.  A message was not left as per 06/01/17 [facility] RN Alisa's note, patient had requested message not be left on cell phone due to phone troubles.   Plan:  Will make second outreach attempt in the next week.   Karrie Meres, PharmD, [place] [phone]  Addendum:  06/11/17 1709:  Received incoming call from patient.  HIPAA details verified and purpose of call explained to patient.  Patient reports she has her Humulin U-500.  She reports if call is about switching her insulin, she would not like to switch to a different insulin---re-assured patient [facility] Pharmacist was not calling to change her insulin.    Patient reports she was unhappy with not being able to get U-500 insulin during recent admission---offered to discuss patient's concerns with [facility] leadership---she agreed to this.   Patient reports she has used a one time coupon for insulin the past.  She reports she will have her same insurance plan for 2019.    Reviewed summary of benefits and preferred drug list on plan website---appears Humulin U-500 is Tier 3, with $45 co-pay/30 day supply in 2019.  Discussed this and the coverage gap with patient for 2019.    Began to discuss [employer] Patient assistance program---patient reported she needed to go and requested call back.    She requests message not be left on her phone if she does not answer.   Plan:  Discussed with [facility] Assistant Director of Pharmacy----was recommended to provide patient with [facility] Patient Experience number.   Will make outreach to patient later this week per her request---she is aware it may not be until later in the week.    Karrie Meres, PharmD, [place] [phone]

## 2017-06-13 ENCOUNTER — Other Ambulatory Visit: Payer: Self-pay | Admitting: Pharmacist

## 2017-06-13 NOTE — Patient Outreach (Signed)
[place] [facility]) Care Management  06/13/2017  Jaclyn Herrera 1946-02-07 [id number]  06/13/16 0958:  Unsuccessful phone outreach attempt to patient.  Call went directly to voicemail.  Did not leave a voicemail as patient has requested no voicemail be left.   1255:  A second unsuccessful phone outreach attempt was made to patient.  Call went directly to voicemail.  Did not leave message as patient requested no voicemail be left.   Plan:  Will make another other outreach attempt in the next week.   Karrie Meres, PharmD, [place] [phone]

## 2017-06-14 ENCOUNTER — Other Ambulatory Visit: Payer: Self-pay | Admitting: Pharmacist

## 2017-06-14 ENCOUNTER — Other Ambulatory Visit: Payer: Self-pay | Admitting: *Deleted

## 2017-06-14 NOTE — Patient Outreach (Signed)
[place] [facility]) Care Management  06/14/2017  Jaclyn Herrera 1945/08/31 [id number]  Second unsuccessful phone outreach attempt to patient.  Call went directly to voicemail.  Per patient request, no message was left.   [facility] Pharmacist has collaborated with [place] regarding difficulty reaching patient and requested [facility] RN provide patient with [facility] Patient Experience phone number if she is able to reach patient.    Plan:  Will make a third phone outreach attempt if no call back within the next week.     Karrie Meres, PharmD, [place] [phone]

## 2017-06-14 NOTE — Patient Outreach (Signed)
[place] [facility]) Care Management  06/14/2017  Jaclyn Herrera 02/08/1946 [id number]  I reached out to [name] by phone today but did not reach her and was unable to leave a voice message. Our team pharmacist has been able to make limited contact with [name] after several attempts as well.   Plan: I will attempt to reach [name] again next week.    [place] Management  [phone]

## 2017-06-18 ENCOUNTER — Other Ambulatory Visit: Payer: Self-pay | Admitting: Pharmacist

## 2017-06-18 NOTE — Patient Outreach (Signed)
[place] [facility]) Care Management  06/18/2017  Jaclyn Herrera Nov 10, 1945 [id number]  Third phone outreach attempt to patient---no answer.  No message left as patient had requested during 06/11/17 phone call that a message not be left.   Plan:  Will send outreach letter.  If no reply in 10 business days, will close pharmacy episode.   Karrie Meres, PharmD, [place] [phone]

## 2017-06-19 ENCOUNTER — Other Ambulatory Visit: Payer: Self-pay | Admitting: Pharmacist

## 2017-06-19 NOTE — Patient Outreach (Signed)
[place] [facility]) Care Management  06/19/2017  PATRESE NEAL Jun 16, 1945 [id number]  Final outreach attempt to patient.  Call was not answered, no message left as patient requested message not be left.    Patient returned call---HIPAA details verified.  Explained purpose of call was to discuss [employer] patient assistance.    Discussed [employer] patient assistance program with patient (Humulin U-500).  Explained patient assistance program income limits and out-of-pocket prescription spend requirement of $1,100 in the calendar year.   Patient aware she needs to meet [facility] patient assistance program requirements to apply, and there is no guarantee she will be eligible.   Also provided patient with the Patient Experience phone number for [facility] so she can discuss her concerns regarding her recent admission at the hospital.    Plan:   At this time patient has no other pharmacy related needs---she has her insulin per her report and agrees to contact [facility] if she would like to apply to see if eligible for manufacturer assistance.   Pharmacy episode will be closed.    Note routed to PCP  Will update [facility] RN Alisa.    Karrie Meres, PharmD, [place] [phone]

## 2017-06-20 ENCOUNTER — Other Ambulatory Visit: Payer: Self-pay | Admitting: *Deleted

## 2017-06-20 NOTE — Patient Outreach (Signed)
[place] [facility]) Care Management  06/20/2017  YALITZA TEED [birth year] [id number]  Mrs. Lonnie Rosado is a 72 year old female patient with medical history which includesdiabetes mellitus, diastolic CHF, hypertension, GERD,andseizure disorder. She was admitted to the hospital via ED and admitted to be treated for DKA and AKI.    Mrs. Estrin was referred to [facility] Case Management for transition of care services, education and care coordination around chronic disease management, and medication management and assistance.   Medication Management - [facility] Care [facility] Pharmacist Isabell Jarvis PharmD has been working with Mrs. Ahart and provided information about the [employer]. Mrs. Fosdick was able to tell me today that she understands she is to spend $1,000 out of pocket and then make contact with either Dr. Genia Hotter or myself when she has met the out of pocket goal.   Diabetes Management - Mrs. Kluender tells me today that she is doing much better with cbg management and that her cbg's are generally in the "mid-100's". She reports that adhering to her prescribed carb modified diet is a work in process but she has knowledge of the diet and tries to adhere to it.   Mrs. Ramella does not feel she is in need of face to face case management intervention but is grateful that she can call us for assistance with medication or other issues that arise.   Plan:   [facility] CM Care Plan Problem One     Most Recent Value  Care Plan Problem One  Knowledge Deficits related to long term management of DMII  Role Documenting the Problem One  Care Management [place] for Problem One  Active  [facility] Long Term Goal   Over the next 60 days, patient will demonstrate understanding of long term plan of care for management of DMII   [facility] Long Term Goal Start Date  06/01/17  Interventions for Problem One Long Term Goal  reviewed current status and long term care plan with patient inquiring  about her need/desire to make any changes to plan  [facility] CM Short Term Goal #1   Over the next 30 days, patient will check cbg's daily and record  [facility] CM Short Term Goal #1 Start Date  06/01/17  Interventions for Short Term Goal #1  discussed cbg findings and rationale for long term monitoring  [facility] CM Short Term Goal #2   Over the next 30 days, patient will take all medications as prescribed  [facility] CM Short Term Goal #2 Start Date  06/01/17  Interventions for Short Term Goal #2  medications reviewed   [facility] CM Short Term Goal #3  Over the next 30 days, patient will report signs and symptoms of hyper and hypoglycemia  [facility] CM Short Term Goal #3 Start Date  06/01/17  Interventions for Short Tern Goal #3  discussed patient status and signs and symptoms,  advised to report any new signs/symptoms      Janalyn Shy [facility] Care Management  [phone]

## 2017-06-24 IMAGING — CT CT MAXILLOFACIAL W/O CM
4 of 10 series · 15 of 47 positions shown, 17 images · non-contrast
Comparison: Head MRI 11/02/2014 and head CT 10/31/2014.

CLINICAL DATA: Fell out of bed this morning. Syncopal episode.
Left-sided headache and right-sided visual changes. Possible TIAs.
Initial encounter.

EXAM:
CT HEAD WITHOUT CONTRAST
CT MAXILLOFACIAL WITHOUT CONTRAST
CT CERVICAL SPINE WITHOUT CONTRAST
TECHNIQUE: Multidetector CT imaging of the head, cervical spine, and
maxillofacial structures were performed using the standard protocol
without intravenous contrast. Multiplanar CT image reconstructions
of the cervical spine and maxillofacial structures were also
generated.

[Series 8: max bone coronal 2.0 spo · coronal · 0.34mm/px · 3 of 107 slices shown]
[im 27/107  bone]
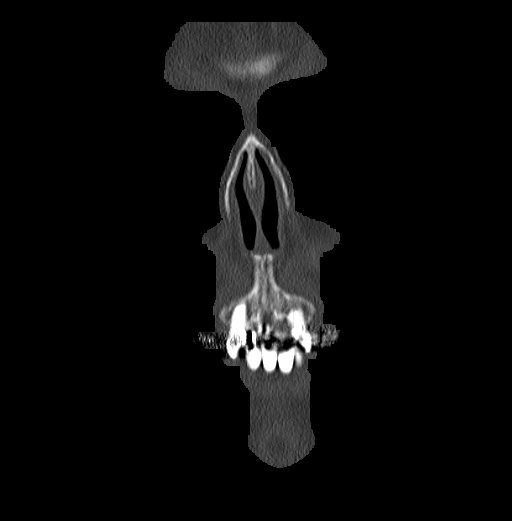
[im 54/107  bone]
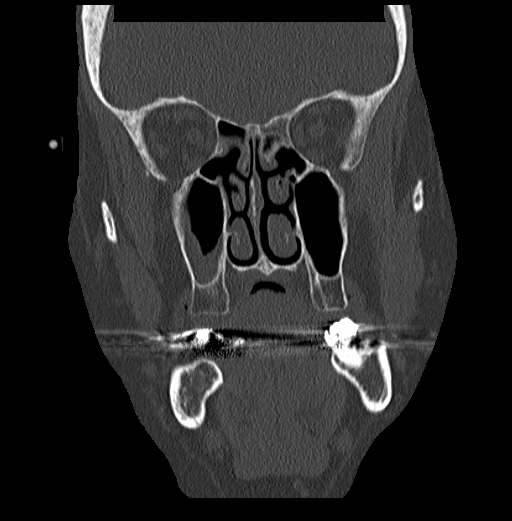
[im 80/107  bone]
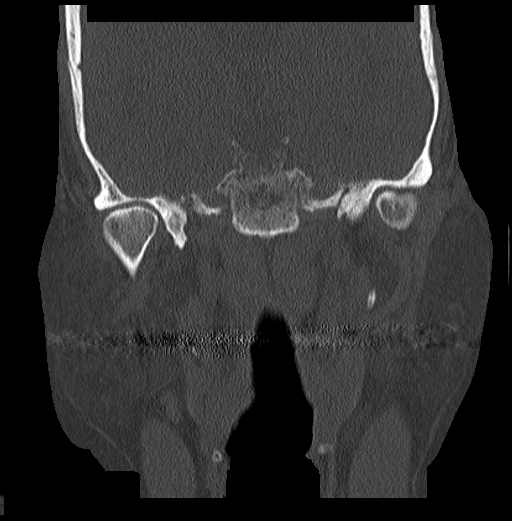

[Series 9: max bone sagittal 2.0 spo · sagittal · 0.32mm/px · 1 of 109 slices shown]
[im 55/109  bone]
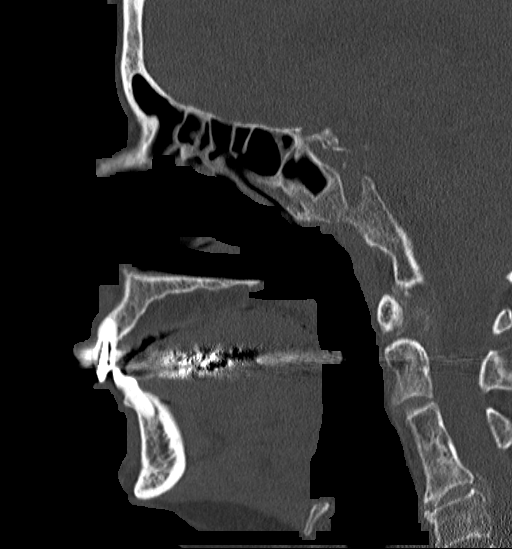

[Series 11: cervical st 2.0 b31s · axial · 0.27mm/px · z∈[+60,+144]mm · 4 of 99 slices shown]
[im 15/99  bone]
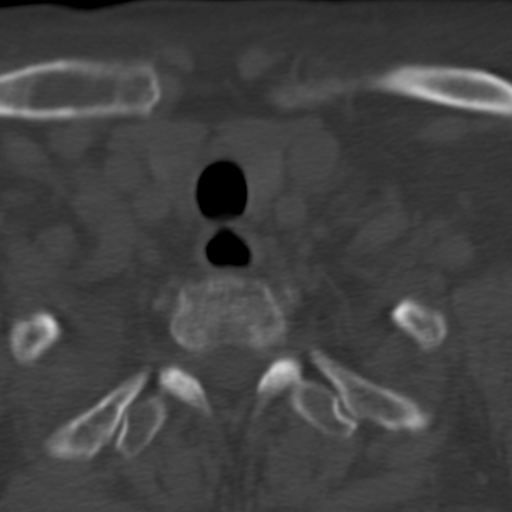
[im 29/99  bone]
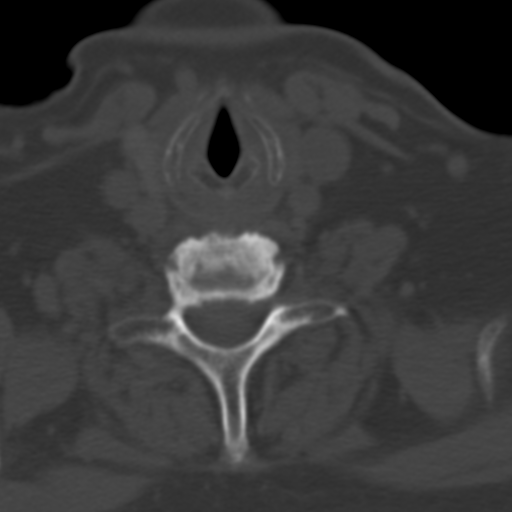
[im 43/99  bone]
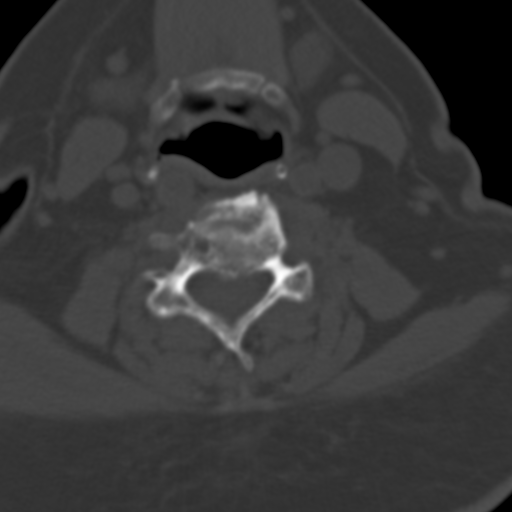
[im 57/99  bone]
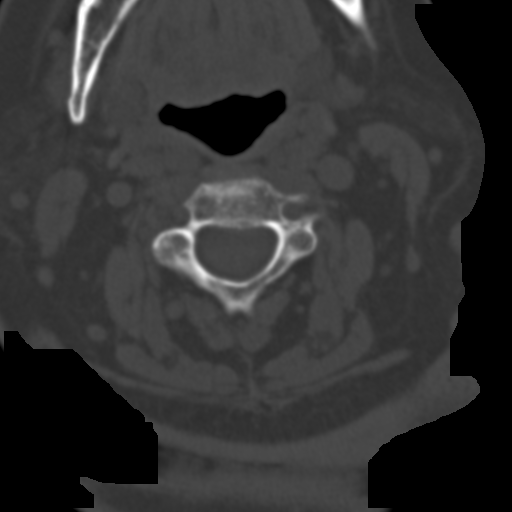

[Series 15: axial bone 2.0 · axial · 0.19mm/px · z∈[+36,+182]mm · 7 of 104 slices shown, 9 images]
[im 13/104  brain]
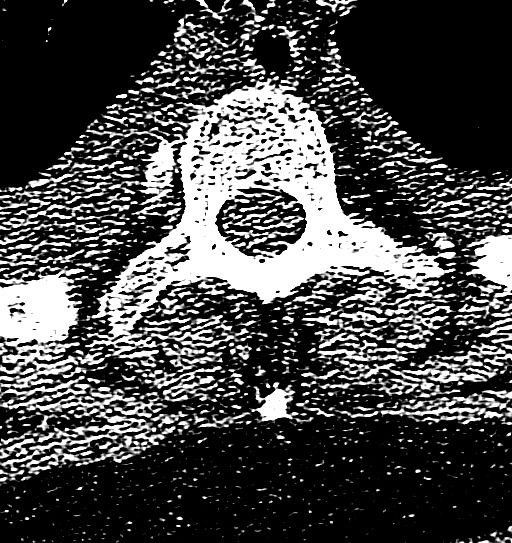
[im 13/104  bone]
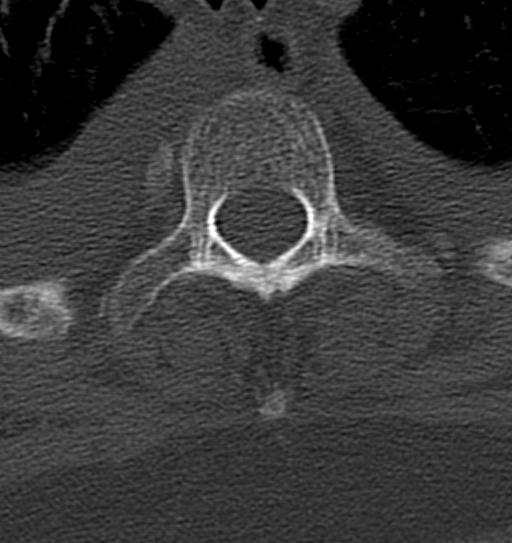
[im 26/104  bone]
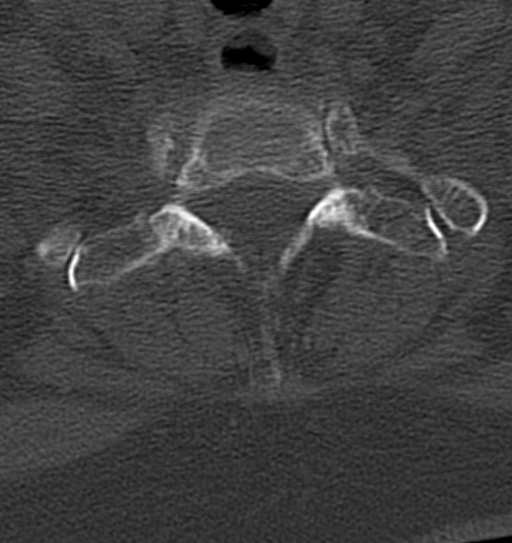
[im 39/104  bone]
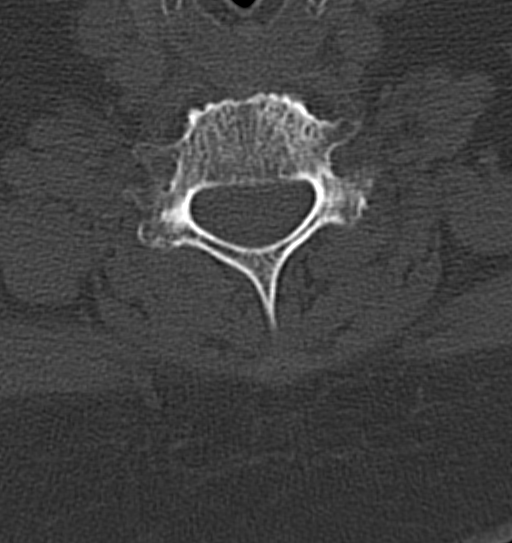
[im 52/104  bone]
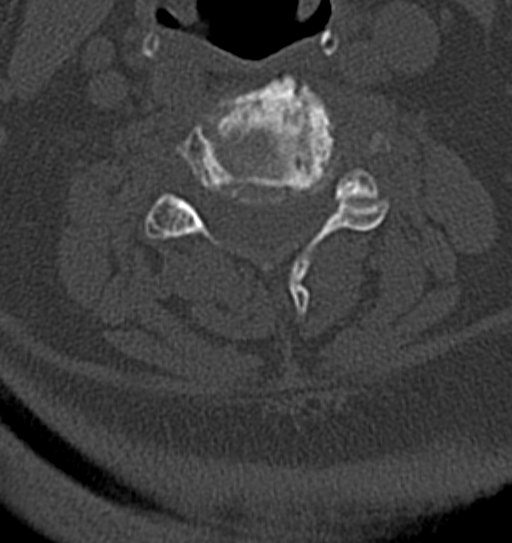
[im 65/104  brain]
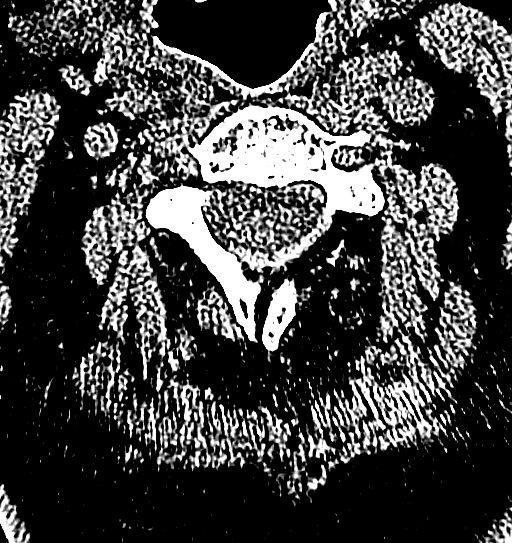
[im 65/104  bone]
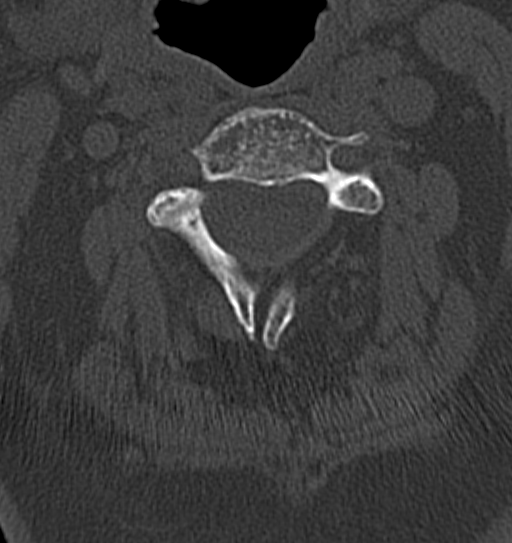
[im 78/104  bone]
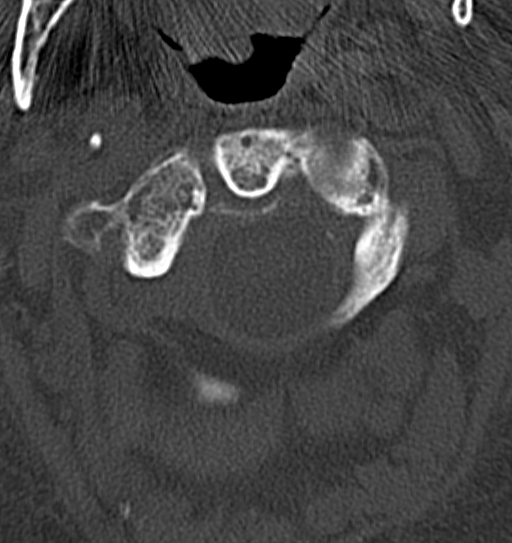
[im 91/104  bone]
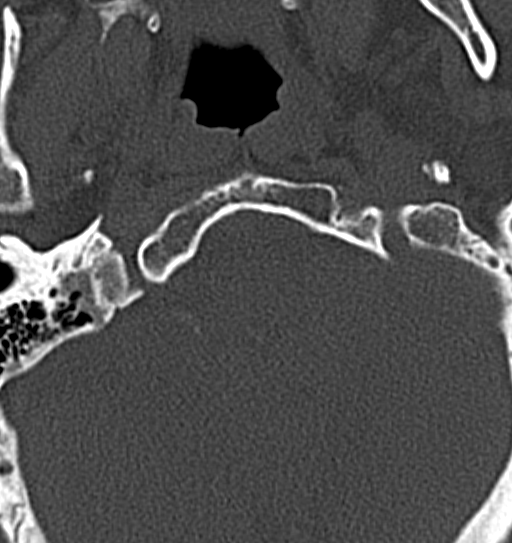

[15 of 47 positions shown; findings below may reference images not displayed]

FINDINGS: CT HEAD FINDINGS

There is no evidence of acute cortical infarct, intracranial
hemorrhage, mass, midline shift, or extra-axial fluid collection.
Mild cerebral atrophy is unchanged.

Prior bilateral cataract extraction is noted. The mastoid air cells
are clear. No skull fracture is identified. Calcified
atherosclerosis is noted at the skullbase.

CT MAXILLOFACIAL FINDINGS

No acute maxillofacial fracture is identified. Prior bilateral
cataract extraction is noted. No retrobulbar hematoma. Prior sinus
surgery with bilateral maxillary antrostomies and partial middle
turbinectomies. Minimal mucosal thickening in the ethmoid air cells
and sphenoid sinuses. Mild mucosal thickening in the right maxillary
sinus. Antrostomies are patent. There is rightward nasal septal
deviation. The mastoid air cells are clear.

CT CERVICAL SPINE FINDINGS

There is straightening of the normal cervical lordosis. There is no
listhesis. Congenital intervertebral and posterior element fusion is
noted at C3-4 with hypoplasia of the right C4 lamina. Moderate disc
degeneration is present at C4-5 and C5-6 with degenerative endplate
spurring and sclerosis. A broad central disc protrusion at C4-5
results in moderate spinal stenosis, with a left foraminal disc
osteophyte complex resulting in moderate left neural foraminal
stenosis at this level. There is congenital incomplete fusion of the
C1 posterior ring. No acute cervical spine fracture is identified.

Moderate size pleural effusions are partially visualized in the lung
apices. Partially retropharyngeal course of the distal right common
carotid artery with atherosclerotic calcification noted.
IMPRESSION: 1. No evidence of acute intracranial abnormality.
2. No maxillofacial fracture identified.
3. No acute osseous abnormality identified in the cervical spine.
4. C4-5 disc degeneration with moderate spinal canal and left neural
foraminal stenosis.
5. Bilateral pleural effusions.

## 2017-06-24 IMAGING — DX DG CHEST 2V
2 series · 2 of 2 positions shown · non-contrast
Comparison: July 13, 2015.

CLINICAL DATA: Fall today.

EXAM:
CHEST  2 VIEW

[chest pa]
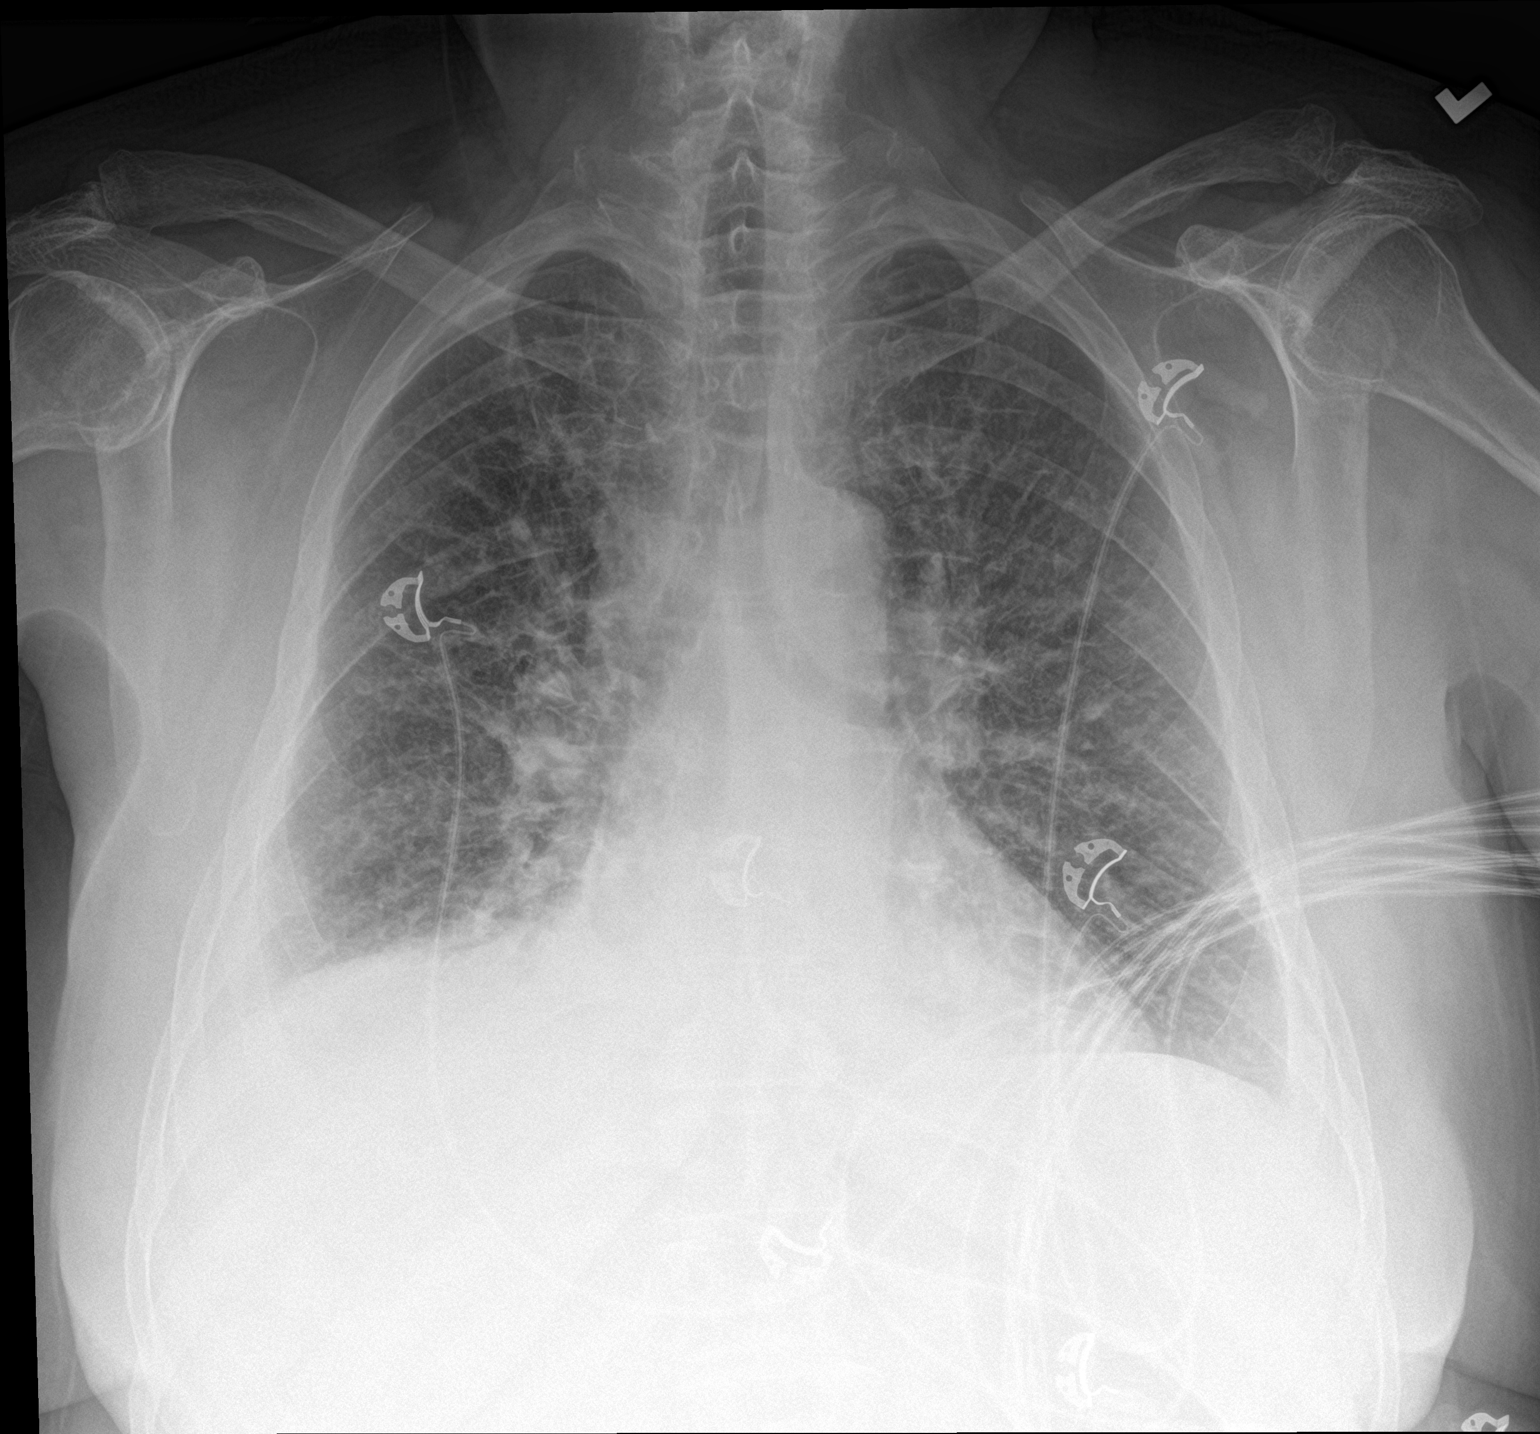

[chest lat]
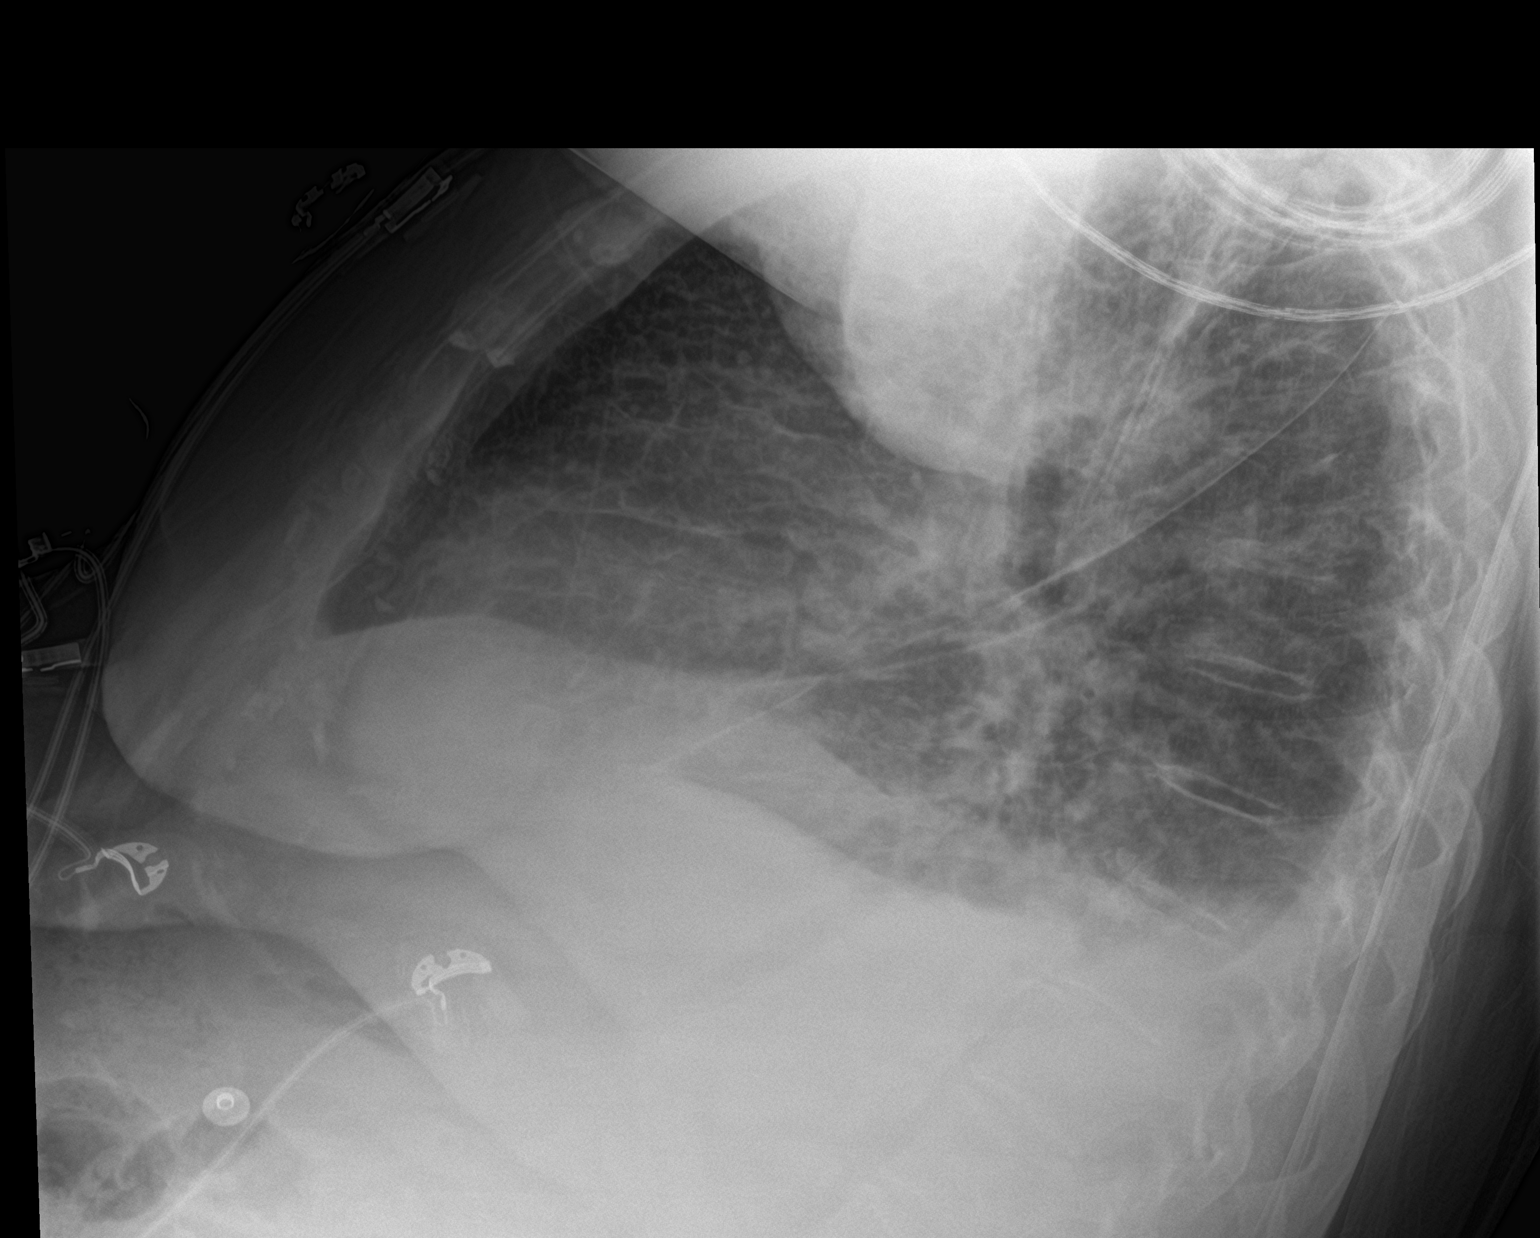

[2 of 2 positions shown; findings below may reference images not displayed]

FINDINGS: Stable cardiomediastinal silhouette. Mild bilateral perihilar and
basilar interstitial densities are noted concerning for edema.
Stable mild bilateral pleural effusions are noted, with right
greater than left. No pneumothorax is noted. Degenerative changes
seen involving the acromioclavicular joints bilaterally.
IMPRESSION: Mild bilateral pulmonary edema and pleural effusions are noted.

## 2017-07-16 DIAGNOSIS — E1140 Type 2 diabetes mellitus with diabetic neuropathy, unspecified: Secondary | ICD-10-CM | POA: Diagnosis not present

## 2017-07-16 DIAGNOSIS — R269 Unspecified abnormalities of gait and mobility: Secondary | ICD-10-CM | POA: Diagnosis not present

## 2017-07-16 DIAGNOSIS — M810 Age-related osteoporosis without current pathological fracture: Secondary | ICD-10-CM | POA: Diagnosis not present

## 2017-07-16 DIAGNOSIS — I5033 Acute on chronic diastolic (congestive) heart failure: Secondary | ICD-10-CM | POA: Diagnosis not present

## 2017-07-16 DIAGNOSIS — E7849 Other hyperlipidemia: Secondary | ICD-10-CM | POA: Diagnosis not present

## 2017-07-16 DIAGNOSIS — D510 Vitamin B12 deficiency anemia due to intrinsic factor deficiency: Secondary | ICD-10-CM | POA: Diagnosis not present

## 2017-07-16 DIAGNOSIS — N08 Glomerular disorders in diseases classified elsewhere: Secondary | ICD-10-CM | POA: Diagnosis not present

## 2017-07-30 DIAGNOSIS — E1129 Type 2 diabetes mellitus with other diabetic kidney complication: Secondary | ICD-10-CM | POA: Diagnosis not present

## 2017-07-30 DIAGNOSIS — I5022 Chronic systolic (congestive) heart failure: Secondary | ICD-10-CM | POA: Diagnosis not present

## 2017-07-30 DIAGNOSIS — J310 Chronic rhinitis: Secondary | ICD-10-CM | POA: Diagnosis not present

## 2017-09-03 DIAGNOSIS — M79671 Pain in right foot: Secondary | ICD-10-CM | POA: Diagnosis not present

## 2017-10-12 ENCOUNTER — Encounter

## 2017-10-12 ENCOUNTER — Ambulatory Visit: Payer: Medicare Other | Admitting: Cardiovascular Disease

## 2017-10-12 ENCOUNTER — Encounter: Payer: Self-pay | Admitting: Cardiovascular Disease

## 2017-10-12 VITALS — BP 146/70 | HR 80 | Ht 68.0 in | Wt 218.8 lb

## 2017-10-12 DIAGNOSIS — I10 Essential (primary) hypertension: Secondary | ICD-10-CM | POA: Diagnosis not present

## 2017-10-12 DIAGNOSIS — I5032 Chronic diastolic (congestive) heart failure: Secondary | ICD-10-CM | POA: Diagnosis not present

## 2017-10-12 NOTE — Patient Instructions (Addendum)
Medication Instructions: Your provider recommends that you continue on your current medications as directed. Please refer to the Current Medication list given to you today.   You may take one extra fluid pill and one extra potassium daily as needed for swelling.  Labwork: None  Testing/Procedures: None  Follow-Up: Your provider wants you to follow-up in: 1 year with Dr. Angelena Form. You will receive a reminder letter in the mail two months in advance. If you don't receive a letter, please call our office to schedule the follow-up appointment.    Any Other Special Instructions Will Be Listed Below (If Applicable).     If you need a refill on your cardiac medications before your next appointment, please call your pharmacy.

## 2017-10-12 NOTE — Progress Notes (Signed)
Chief Complaint  Patient presents with  . Follow-up    History of Present Illness: 72 yo female with history of DM, HTN, HLD, hypothyroidism, GERD, anemia here today for cardiac follow up. I saw her as a new patient 04/30/13  for evaluation of dyspnea and weight gain, chest pain and cough. She had a stress test in 2000 that showed no ischemia. She had a cardiac cath in 2000 and was told it was normal. Chronic right lower ext edema from orthopedic injury. Echo 05/15/13 with moderate LVH, normal LV function, grade 2 diastolic dysfunction, mild MR. Lexiscan stress myoview 05/22/13 with no ischemia. She was seen in our office by Cecilie Kicks, NP in November 2016 for chest pain, dyspnea. D-dimer was elevated which lead to a CTA of the chest. This showed no PE but there were bilateral pleural effusions. She was then seen by Dr. Elsworth Soho in the pulmonary office and underwent thoracentesis on 06/04/15. She was started on Lasix and Norvasc was stopped. Echo 06/1915 with normal LV systolic function, elevated filling pressures and mild MR. With higher dose of lasix, her LE edema improved and her weight was down. Admitted to [facility] 08/13/15 after syncopal episode. Chest xray with no pulmonary edema or pleural effusions.   She is here today for follow up. The patient denies any chest pain, dyspnea, palpitations, lower extremity edema, orthopnea, PND, dizziness, near syncope or syncope. She was bitten by fleas and is upset about the reaction over her arms.   Primary Care Physician: Asencion Noble, MD  Endocrine: Forde Dandy   Past Medical History:  Diagnosis Date  . 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 Arthritis   . Bronchitis   . CHF (congestive heart failure) ([place])   . Chronic diastolic heart failure ([place]) 07/13/2015  . COPD (chronic obstructive pulmonary disease) ([place])   . Depression   . Diabetes mellitus   . DKA (diabetic ketoacidoses) ([place]) 09/27/2014  . GERD (gastroesophageal reflux disease)   .  Headache   . Hyperlipidemia   . Hypertension   . Hypothyroid 10/31/2014  . hypothyroidism   . Pneumonia    hx of   . PONV (postoperative nausea and vomiting)    and slow to wake up after foot surgery   . Seizures ([place])    2017  . Shingles   . Syncope 10/31/2014  . Tubular adenoma of colon   . Vertigo 10/31/2014    Past Surgical History:  Procedure Laterality Date  . ANKLE SURGERY     x 4  . APPENDECTOMY    . CATARACT EXTRACTION Bilateral   . CATARACT EXTRACTION W/ INTRAOCULAR LENS IMPLANT    . CHOLECYSTECTOMY    . COLONOSCOPY W/ BIOPSIES AND POLYPECTOMY    . ESOPHAGOGASTRODUODENOSCOPY    . HARDWARE REMOVAL Left 11/03/2015   Procedure: HARDWARE REMOVAL LEFT FOOT appliction of wound vac;  Surgeon: Gaynelle Arabian, MD;  Location: [facility];  Service: Orthopedics;  Laterality: Left;  . INCISION AND DRAINAGE OF WOUND Left 11/03/2015   Procedure: IRRIGATION AND DEBRIDEMENT LEFT FOOT ULCER;  Surgeon: Gaynelle Arabian, MD;  Location: [facility];  Service: Orthopedics;  Laterality: Left;  . KNEE ARTHROSCOPY    . NASAL SINUS SURGERY    . non cancerous mass removed     small intestine  . oophrectomy    . ORIF CALCANEOUS FRACTURE Left 12/29/2014   Procedure: OPEN REDUCTION INTERNAL FIXATION  LEFT CALCANEOUS FRACTURE;  Surgeon: Gaynelle Arabian, MD;  Location: [facility];  Service: Orthopedics;  Laterality: Left;  . TONSILLECTOMY      Current Outpatient Medications  Medication Sig Dispense Refill  . acetaminophen (TYLENOL) 500 MG tablet Take 500-1,000 mg by mouth every 6 (six) hours as needed (For pain.).     Marland Kitchen acetaminophen-codeine (TYLENOL #3) 300-30 MG tablet     . Ascorbic Acid (VITAMIN C PO) Take 2 tablets by mouth daily.    . carvedilol (COREG) 6.25 MG tablet Take 6.25 mg by mouth 2 (two) times daily.    . colchicine 0.6 MG tablet Take 1 tablet (0.6 mg total) by mouth daily as needed. DO NOT TAKE WHILE TAKING FLUCONAZOLE    . esomeprazole (NEXIUM) 20 MG capsule Take 20 mg by mouth daily.     .  fluticasone (FLONASE) 50 MCG/ACT nasal spray Place 2 sprays into the nose 2 (two) times daily as needed for allergies.     Marland Kitchen HUMULIN R 500 UNIT/ML injection Inject 14 units into the skin twice daily. Inject 9 units into the skin daily at bedtime.    . levETIRAcetam (KEPPRA) 500 MG tablet Take 1 tablet (500 mg total) by mouth 2 (two) times daily. 60 tablet 3  . levothyroxine (SYNTHROID, LEVOTHROID) 125 MCG tablet Take 125 mcg by mouth daily before breakfast.     . PARoxetine (PAXIL) 40 MG tablet Take 40 mg by mouth daily.    . Polyethylene Glycol 400 (BLINK TEARS OP) Place 1 drop into both eyes 4 (four) times daily as needed (Dry eyes).     . potassium chloride (K-DUR) 10 MEQ tablet TAKE ONE TABLET BY MOUTH ONCE DAILY 90 tablet 3  . quinapril (ACCUPRIL) 40 MG tablet Take 40 mg by mouth daily.     . Senna-Psyllium (PERDIEM PO) Take 1 tablet by mouth daily as needed (For constipation.).     Marland Kitchen simvastatin (ZOCOR) 20 MG tablet Take 1 tablet (20 mg total) by mouth at bedtime. DO NOT TAKE WHILE TAKING FLUCONAZOLE 30 tablet   . torsemide (DEMADEX) 20 MG tablet Take 20 mg by mouth 2 (two) times daily.    . vitamin B-12 (CYANOCOBALAMIN) 1000 MCG tablet Take 1,000 mcg by mouth daily.     No current facility-administered medications for this visit.     Allergies  Allergen Reactions  . Sulfonamide Derivatives Anaphylaxis  . Bactrim [Sulfamethoxazole-Trimethoprim] Nausea Only  . Erythromycin Nausea And Vomiting  . Latex Itching and Other (See Comments)    If she wears gloves too long her hands start itching  . Mucinex [Guaifenesin Er] Nausea And Vomiting  . Aspirin Rash  . Levofloxacin Rash  . Tape Rash    Social History   Socioeconomic History  . Marital status: Widowed    Spouse name: Not on file  . Number of children: Not on file  . Years of education: Not on file  . Highest education level: Not on file  Occupational History  . Occupation: Retired-[employer]  Social Needs  . Financial resource  strain: Not on file  . Food insecurity:    Worry: Not on file    Inability: Not on file  . Transportation needs:    Medical: Not on file    Non-medical: Not on file  Tobacco Use  . Smoking status: Never Smoker  . Smokeless tobacco: Never Used  Substance and Sexual Activity  . Alcohol use: No  . Drug use: No  . Sexual activity: Not Currently  Lifestyle  . Physical activity:    Days per week:  Not on file    Minutes per session: Not on file  . Stress: Not on file  Relationships  . Social connections:    Talks on phone: Not on file    Gets together: Not on file    Attends religious service: Not on file    Active member of club or organization: Not on file    Attends meetings of clubs or organizations: Not on file    Relationship status: Not on file  . Intimate partner violence:    Fear of current or ex partner: Not on file    Emotionally abused: Not on file    Physically abused: Not on file    Forced sexual activity: Not on file  Other Topics Concern  . Not on file  Social History Narrative   The patient is widowed. She is retired from [employer]. No children.   One or 2 caffeinated beverages daily   She has split time between [place] in the winter and [place] in the [place]    Family History  Problem Relation Age of Onset  . Diabetes Mother   . Asthma Mother   . Hypertension Father   . Heart disease Father   . Diabetes Father   . Heart disease Sister   . Hypertension Sister   . Asthma Sister   . Diabetes Brother   . CAD Brother   . Stroke Brother   . Stomach cancer Maternal Aunt     Review of Systems:  As stated in the HPI and otherwise negative.   BP (!) 146/70   Pulse 80   Ht 5\' 8"  (1.727 m)   Wt 218 lb 12.8 oz (99.2 kg)   SpO2 98%   BMI 33.27 kg/m   Physical Examination: General: Well developed, well nourished, NAD  HEENT: OP clear, mucus membranes moist  SKIN: warm, dry. Both arms have flea bites with surrounding erythema Neuro: No focal deficits    Musculoskeletal: Muscle strength 5/5 all ext  Psychiatric: Mood and affect normal  Neck: No JVD, no carotid bruits, no thyromegaly, no lymphadenopathy.  Lungs:Clear bilaterally, no wheezes, rhonci, crackles Cardiovascular: Regular rate and rhythm. No murmurs, gallops or rubs. Abdomen:Soft. Bowel sounds present. Non-tender.  Extremities: No lower extremity edema. Pulses are 2 + in the bilateral DP/PT.  Echo 07/01/15: Left ventricle: The cavity size was normal. Wall thickness was increased in a pattern of mild LVH. Systolic function was normal. The estimated ejection fraction was in the range of 60% to 65%. Doppler parameters are consistent with elevated ventricular end-diastolic filling pressure. - Mitral valve: Calcified annulus. Mildly thickened leaflets . There was mild to moderate regurgitation. - Left atrium: The atrium was moderately dilated. - Atrial septum: No defect or patent foramen ovale was identified.  EKG:  EKG is ordered today. The ekg ordered today demonstrates NSR, rate 80 bpm. Non specific T wave abn  Recent Labs: 05/30/2017: ALT 12; Hemoglobin 11.2; Platelets 156 05/31/2017: BUN 28; Creatinine, Ser 1.14; Magnesium 2.2; Potassium 4.1; Sodium 134   Lipid Panel No results found for: CHOL, TRIG, HDL, CHOLHDL, VLDL, LDLCALC, LDLDIRECT   Wt Readings from Last 3 Encounters:  10/12/17 218 lb 12.8 oz (99.2 kg)  05/29/17 201 lb (91.2 kg)  01/11/17 213 lb (96.6 kg)     Other studies Reviewed: Additional studies/ records that were reviewed today include: . Review of the above records demonstrates:   Assessment and Plan:   1. Chronic diastolic CHF: Volume status is ok. Weight is stable. No  LE edema. Continue Torsemide 20 mg po BID  2. Chest pain: No recent chest pain. Cardiac cath in 2000 with reported normal coronary arteries. Stress test 2014 with no ischemia.    3. Mitral regurgitation: Mild by echo 2017. Repeat echo in 2020.   4. HTN: BP is  controlled.  No changes.   Current medicines are reviewed at length with the patient today.  The patient does not have concerns regarding medicines.  The following changes have been made:  no change  Labs/ tests ordered today include:   Orders Placed This Encounter  Procedures  . EKG 12-Lead    Disposition:   FU with me in 12  months  Signed, Lauree Chandler, MD 10/14/2017 9:09 AM    [facility] [place], [place],   [address] Phone: [phone]; Fax: [phone]

## 2017-10-17 ENCOUNTER — Other Ambulatory Visit (INDEPENDENT_AMBULATORY_CARE_PROVIDER_SITE_OTHER): Payer: Self-pay | Admitting: Otolaryngology

## 2017-10-17 DIAGNOSIS — E041 Nontoxic single thyroid nodule: Secondary | ICD-10-CM

## 2017-10-18 DIAGNOSIS — E119 Type 2 diabetes mellitus without complications: Secondary | ICD-10-CM | POA: Diagnosis not present

## 2017-10-18 DIAGNOSIS — I5032 Chronic diastolic (congestive) heart failure: Secondary | ICD-10-CM | POA: Diagnosis not present

## 2017-10-18 DIAGNOSIS — Z79899 Other long term (current) drug therapy: Secondary | ICD-10-CM | POA: Diagnosis not present

## 2017-10-22 DIAGNOSIS — N183 Chronic kidney disease, stage 3 (moderate): Secondary | ICD-10-CM | POA: Diagnosis not present

## 2017-10-22 DIAGNOSIS — E785 Hyperlipidemia, unspecified: Secondary | ICD-10-CM | POA: Diagnosis not present

## 2017-10-22 DIAGNOSIS — R252 Cramp and spasm: Secondary | ICD-10-CM | POA: Diagnosis not present

## 2017-10-22 DIAGNOSIS — I5032 Chronic diastolic (congestive) heart failure: Secondary | ICD-10-CM | POA: Diagnosis not present

## 2017-11-19 ENCOUNTER — Ambulatory Visit (HOSPITAL_COMMUNITY)
Admission: RE | Admit: 2017-11-19 | Discharge: 2017-11-19 | Disposition: A | Discharge status: Home / Self Care | Payer: Medicare Other | Source: Ambulatory Visit | Attending: Otolaryngology | Admitting: Otolaryngology

## 2017-11-19 DIAGNOSIS — E041 Nontoxic single thyroid nodule: Secondary | ICD-10-CM | POA: Diagnosis present

## 2017-11-19 DIAGNOSIS — E042 Nontoxic multinodular goiter: Secondary | ICD-10-CM | POA: Diagnosis not present

## 2017-11-26 ENCOUNTER — Ambulatory Visit (INDEPENDENT_AMBULATORY_CARE_PROVIDER_SITE_OTHER): Payer: Medicare Other | Admitting: Otolaryngology

## 2017-11-26 DIAGNOSIS — H608X3 Other otitis externa, bilateral: Secondary | ICD-10-CM | POA: Diagnosis not present

## 2017-11-26 DIAGNOSIS — D440 Neoplasm of uncertain behavior of thyroid gland: Secondary | ICD-10-CM

## 2017-12-06 ENCOUNTER — Other Ambulatory Visit: Payer: Self-pay | Admitting: Cardiovascular Disease

## 2017-12-20 DIAGNOSIS — Z1389 Encounter for screening for other disorder: Secondary | ICD-10-CM | POA: Diagnosis not present

## 2017-12-20 DIAGNOSIS — E1142 Type 2 diabetes mellitus with diabetic polyneuropathy: Secondary | ICD-10-CM | POA: Diagnosis not present

## 2017-12-20 DIAGNOSIS — R269 Unspecified abnormalities of gait and mobility: Secondary | ICD-10-CM | POA: Diagnosis not present

## 2017-12-20 DIAGNOSIS — N183 Chronic kidney disease, stage 3 (moderate): Secondary | ICD-10-CM | POA: Diagnosis not present

## 2017-12-20 DIAGNOSIS — I6350 Cerebral infarction due to unspecified occlusion or stenosis of unspecified cerebral artery: Secondary | ICD-10-CM | POA: Diagnosis not present

## 2017-12-20 DIAGNOSIS — E1149 Type 2 diabetes mellitus with other diabetic neurological complication: Secondary | ICD-10-CM | POA: Diagnosis not present

## 2018-02-06 DIAGNOSIS — N183 Chronic kidney disease, stage 3 (moderate): Secondary | ICD-10-CM | POA: Diagnosis not present

## 2018-02-06 DIAGNOSIS — I5032 Chronic diastolic (congestive) heart failure: Secondary | ICD-10-CM | POA: Diagnosis not present

## 2018-02-18 DIAGNOSIS — N3001 Acute cystitis with hematuria: Secondary | ICD-10-CM | POA: Diagnosis not present

## 2018-02-18 DIAGNOSIS — N10 Acute tubulo-interstitial nephritis: Secondary | ICD-10-CM | POA: Diagnosis not present

## 2018-02-18 DIAGNOSIS — I5032 Chronic diastolic (congestive) heart failure: Secondary | ICD-10-CM | POA: Diagnosis not present

## 2018-03-18 DIAGNOSIS — Z23 Encounter for immunization: Secondary | ICD-10-CM | POA: Diagnosis not present

## 2018-03-28 DIAGNOSIS — E113293 Type 2 diabetes mellitus with mild nonproliferative diabetic retinopathy without macular edema, bilateral: Secondary | ICD-10-CM | POA: Diagnosis not present

## 2018-03-28 DIAGNOSIS — H04129 Dry eye syndrome of unspecified lacrimal gland: Secondary | ICD-10-CM | POA: Diagnosis not present

## 2018-03-28 DIAGNOSIS — H26491 Other secondary cataract, right eye: Secondary | ICD-10-CM | POA: Diagnosis not present

## 2018-04-23 DIAGNOSIS — Z794 Long term (current) use of insulin: Secondary | ICD-10-CM | POA: Diagnosis not present

## 2018-04-23 DIAGNOSIS — E1149 Type 2 diabetes mellitus with other diabetic neurological complication: Secondary | ICD-10-CM | POA: Diagnosis not present

## 2018-04-23 DIAGNOSIS — N183 Chronic kidney disease, stage 3 (moderate): Secondary | ICD-10-CM | POA: Diagnosis not present

## 2018-04-23 DIAGNOSIS — I10 Essential (primary) hypertension: Secondary | ICD-10-CM | POA: Diagnosis not present

## 2018-04-23 DIAGNOSIS — E1140 Type 2 diabetes mellitus with diabetic neuropathy, unspecified: Secondary | ICD-10-CM | POA: Diagnosis not present

## 2018-04-25 DIAGNOSIS — B029 Zoster without complications: Secondary | ICD-10-CM | POA: Diagnosis not present

## 2018-05-14 DIAGNOSIS — Z79899 Other long term (current) drug therapy: Secondary | ICD-10-CM | POA: Diagnosis not present

## 2018-05-14 DIAGNOSIS — I5032 Chronic diastolic (congestive) heart failure: Secondary | ICD-10-CM | POA: Diagnosis not present

## 2018-05-20 DIAGNOSIS — M25561 Pain in right knee: Secondary | ICD-10-CM | POA: Diagnosis not present

## 2018-05-20 DIAGNOSIS — I5032 Chronic diastolic (congestive) heart failure: Secondary | ICD-10-CM | POA: Diagnosis not present

## 2018-08-23 DIAGNOSIS — N183 Chronic kidney disease, stage 3 (moderate): Secondary | ICD-10-CM | POA: Diagnosis not present

## 2018-08-23 DIAGNOSIS — E785 Hyperlipidemia, unspecified: Secondary | ICD-10-CM | POA: Diagnosis not present

## 2018-08-23 DIAGNOSIS — Z79899 Other long term (current) drug therapy: Secondary | ICD-10-CM | POA: Diagnosis not present

## 2018-08-23 DIAGNOSIS — I5032 Chronic diastolic (congestive) heart failure: Secondary | ICD-10-CM | POA: Diagnosis not present

## 2018-08-28 DIAGNOSIS — N183 Chronic kidney disease, stage 3 (moderate): Secondary | ICD-10-CM | POA: Diagnosis not present

## 2018-08-28 DIAGNOSIS — M1000 Idiopathic gout, unspecified site: Secondary | ICD-10-CM | POA: Diagnosis not present

## 2018-08-28 DIAGNOSIS — I5032 Chronic diastolic (congestive) heart failure: Secondary | ICD-10-CM | POA: Diagnosis not present

## 2018-09-06 ENCOUNTER — Inpatient Hospital Stay (HOSPITAL_COMMUNITY)
Admission: EM | Admit: 2018-09-06 | Discharge: 2018-09-10 | DRG: 064 | Disposition: A | Discharge status: Skilled Nursing Facility | Payer: Medicare Other | Attending: Family Medicine | Admitting: Family Medicine

## 2018-09-06 ENCOUNTER — Emergency Department (HOSPITAL_COMMUNITY): Payer: Medicare Other

## 2018-09-06 ENCOUNTER — Encounter (HOSPITAL_COMMUNITY): Payer: Self-pay | Admitting: Emergency Medicine

## 2018-09-06 DIAGNOSIS — Z886 Allergy status to analgesic agent status: Secondary | ICD-10-CM

## 2018-09-06 DIAGNOSIS — M25552 Pain in left hip: Secondary | ICD-10-CM | POA: Diagnosis present

## 2018-09-06 DIAGNOSIS — F329 Major depressive disorder, single episode, unspecified: Secondary | ICD-10-CM | POA: Diagnosis present

## 2018-09-06 DIAGNOSIS — W19XXXA Unspecified fall, initial encounter: Secondary | ICD-10-CM | POA: Diagnosis not present

## 2018-09-06 DIAGNOSIS — Z8249 Family history of ischemic heart disease and other diseases of the circulatory system: Secondary | ICD-10-CM

## 2018-09-06 DIAGNOSIS — I5032 Chronic diastolic (congestive) heart failure: Secondary | ICD-10-CM | POA: Diagnosis present

## 2018-09-06 DIAGNOSIS — I639 Cerebral infarction, unspecified: Secondary | ICD-10-CM | POA: Diagnosis present

## 2018-09-06 DIAGNOSIS — I63531 Cerebral infarction due to unspecified occlusion or stenosis of right posterior cerebral artery: Secondary | ICD-10-CM | POA: Diagnosis not present

## 2018-09-06 DIAGNOSIS — G936 Cerebral edema: Secondary | ICD-10-CM | POA: Diagnosis present

## 2018-09-06 DIAGNOSIS — R739 Hyperglycemia, unspecified: Secondary | ICD-10-CM | POA: Diagnosis not present

## 2018-09-06 DIAGNOSIS — R531 Weakness: Secondary | ICD-10-CM | POA: Diagnosis not present

## 2018-09-06 DIAGNOSIS — Z7984 Long term (current) use of oral hypoglycemic drugs: Secondary | ICD-10-CM

## 2018-09-06 DIAGNOSIS — Z91048 Other nonmedicinal substance allergy status: Secondary | ICD-10-CM

## 2018-09-06 DIAGNOSIS — R29818 Other symptoms and signs involving the nervous system: Secondary | ICD-10-CM | POA: Diagnosis not present

## 2018-09-06 DIAGNOSIS — I63431 Cerebral infarction due to embolism of right posterior cerebral artery: Principal | ICD-10-CM | POA: Diagnosis present

## 2018-09-06 DIAGNOSIS — Z9842 Cataract extraction status, left eye: Secondary | ICD-10-CM

## 2018-09-06 DIAGNOSIS — M109 Gout, unspecified: Secondary | ICD-10-CM | POA: Diagnosis not present

## 2018-09-06 DIAGNOSIS — Z800 Family history of malignant neoplasm of digestive organs: Secondary | ICD-10-CM

## 2018-09-06 DIAGNOSIS — J449 Chronic obstructive pulmonary disease, unspecified: Secondary | ICD-10-CM | POA: Diagnosis not present

## 2018-09-06 DIAGNOSIS — I110 Hypertensive heart disease with heart failure: Secondary | ICD-10-CM | POA: Diagnosis not present

## 2018-09-06 DIAGNOSIS — R29703 NIHSS score 3: Secondary | ICD-10-CM | POA: Diagnosis not present

## 2018-09-06 DIAGNOSIS — J40 Bronchitis, not specified as acute or chronic: Secondary | ICD-10-CM | POA: Diagnosis present

## 2018-09-06 DIAGNOSIS — I10 Essential (primary) hypertension: Secondary | ICD-10-CM

## 2018-09-06 DIAGNOSIS — K219 Gastro-esophageal reflux disease without esophagitis: Secondary | ICD-10-CM | POA: Diagnosis not present

## 2018-09-06 DIAGNOSIS — M6281 Muscle weakness (generalized): Secondary | ICD-10-CM | POA: Diagnosis not present

## 2018-09-06 DIAGNOSIS — G40109 Localization-related (focal) (partial) symptomatic epilepsy and epileptic syndromes with simple partial seizures, not intractable, without status epilepticus: Secondary | ICD-10-CM | POA: Diagnosis present

## 2018-09-06 DIAGNOSIS — Z794 Long term (current) use of insulin: Secondary | ICD-10-CM

## 2018-09-06 DIAGNOSIS — Z91040 Latex allergy status: Secondary | ICD-10-CM

## 2018-09-06 DIAGNOSIS — H5340 Unspecified visual field defects: Secondary | ICD-10-CM | POA: Diagnosis present

## 2018-09-06 DIAGNOSIS — R55 Syncope and collapse: Secondary | ICD-10-CM | POA: Diagnosis not present

## 2018-09-06 DIAGNOSIS — E785 Hyperlipidemia, unspecified: Secondary | ICD-10-CM | POA: Diagnosis present

## 2018-09-06 DIAGNOSIS — Z833 Family history of diabetes mellitus: Secondary | ICD-10-CM

## 2018-09-06 DIAGNOSIS — S50312A Abrasion of left elbow, initial encounter: Secondary | ICD-10-CM | POA: Diagnosis present

## 2018-09-06 DIAGNOSIS — Z823 Family history of stroke: Secondary | ICD-10-CM

## 2018-09-06 DIAGNOSIS — Z9049 Acquired absence of other specified parts of digestive tract: Secondary | ICD-10-CM

## 2018-09-06 DIAGNOSIS — J309 Allergic rhinitis, unspecified: Secondary | ICD-10-CM | POA: Diagnosis not present

## 2018-09-06 DIAGNOSIS — Z79899 Other long term (current) drug therapy: Secondary | ICD-10-CM

## 2018-09-06 DIAGNOSIS — Z825 Family history of asthma and other chronic lower respiratory diseases: Secondary | ICD-10-CM

## 2018-09-06 DIAGNOSIS — Z961 Presence of intraocular lens: Secondary | ICD-10-CM | POA: Diagnosis present

## 2018-09-06 DIAGNOSIS — Z882 Allergy status to sulfonamides status: Secondary | ICD-10-CM

## 2018-09-06 DIAGNOSIS — E1165 Type 2 diabetes mellitus with hyperglycemia: Secondary | ICD-10-CM | POA: Diagnosis present

## 2018-09-06 DIAGNOSIS — R1312 Dysphagia, oropharyngeal phase: Secondary | ICD-10-CM | POA: Diagnosis not present

## 2018-09-06 DIAGNOSIS — I619 Nontraumatic intracerebral hemorrhage, unspecified: Secondary | ICD-10-CM | POA: Diagnosis present

## 2018-09-06 DIAGNOSIS — Z741 Need for assistance with personal care: Secondary | ICD-10-CM | POA: Diagnosis not present

## 2018-09-06 DIAGNOSIS — E1169 Type 2 diabetes mellitus with other specified complication: Secondary | ICD-10-CM | POA: Diagnosis present

## 2018-09-06 DIAGNOSIS — F419 Anxiety disorder, unspecified: Secondary | ICD-10-CM | POA: Diagnosis present

## 2018-09-06 DIAGNOSIS — R569 Unspecified convulsions: Secondary | ICD-10-CM | POA: Diagnosis not present

## 2018-09-06 DIAGNOSIS — E119 Type 2 diabetes mellitus without complications: Secondary | ICD-10-CM | POA: Diagnosis not present

## 2018-09-06 DIAGNOSIS — R262 Difficulty in walking, not elsewhere classified: Secondary | ICD-10-CM | POA: Diagnosis not present

## 2018-09-06 DIAGNOSIS — E039 Hypothyroidism, unspecified: Secondary | ICD-10-CM | POA: Diagnosis not present

## 2018-09-06 DIAGNOSIS — I63331 Cerebral infarction due to thrombosis of right posterior cerebral artery: Secondary | ICD-10-CM | POA: Diagnosis not present

## 2018-09-06 DIAGNOSIS — Z881 Allergy status to other antibiotic agents status: Secondary | ICD-10-CM

## 2018-09-06 DIAGNOSIS — Z888 Allergy status to other drugs, medicaments and biological substances status: Secondary | ICD-10-CM

## 2018-09-06 DIAGNOSIS — E1159 Type 2 diabetes mellitus with other circulatory complications: Secondary | ICD-10-CM | POA: Diagnosis present

## 2018-09-06 DIAGNOSIS — Z9841 Cataract extraction status, right eye: Secondary | ICD-10-CM

## 2018-09-06 DIAGNOSIS — I69315 Cognitive social or emotional deficit following cerebral infarction: Secondary | ICD-10-CM | POA: Diagnosis not present

## 2018-09-06 DIAGNOSIS — Z9181 History of falling: Secondary | ICD-10-CM | POA: Diagnosis not present

## 2018-09-06 LAB — CBC WITH DIFFERENTIAL/PLATELET
Abs Immature Granulocytes: 0.03 10*3/uL (ref 0.00–0.07)
Basophils Absolute: 0 10*3/uL (ref 0.0–0.1)
Basophils Relative: 0 %
Eosinophils Absolute: 0.1 10*3/uL (ref 0.0–0.5)
Eosinophils Relative: 1 %
HCT: 32.6 % — ABNORMAL LOW (ref 36.0–46.0)
Hemoglobin: 11.1 g/dL — ABNORMAL LOW (ref 12.0–15.0)
Immature Granulocytes: 0 %
Lymphocytes Relative: 11 %
Lymphs Abs: 0.7 10*3/uL (ref 0.7–4.0)
MCH: 28.8 pg (ref 26.0–34.0)
MCHC: 34 g/dL (ref 30.0–36.0)
MCV: 84.5 fL (ref 80.0–100.0)
Monocytes Absolute: 0.5 10*3/uL (ref 0.1–1.0)
Monocytes Relative: 7 %
NEUTROS ABS: 5.7 10*3/uL (ref 1.7–7.7)
Neutrophils Relative %: 81 %
Platelets: 155 10*3/uL (ref 150–400)
RBC: 3.86 MIL/uL — ABNORMAL LOW (ref 3.87–5.11)
RDW: 14 % (ref 11.5–15.5)
WBC: 7 10*3/uL (ref 4.0–10.5)
nRBC: 0 % (ref 0.0–0.2)

## 2018-09-06 LAB — BLOOD GAS, VENOUS
Acid-base deficit: 3.6 mmol/L — ABNORMAL HIGH (ref 0.0–2.0)
Bicarbonate: 20.1 mmol/L (ref 20.0–28.0)
FIO2: 21
O2 Saturation: 45 %
PATIENT TEMPERATURE: 37.4
pCO2, Ven: 39.4 mmHg — ABNORMAL LOW (ref 44.0–60.0)
pH, Ven: 7.349 (ref 7.250–7.430)
pO2, Ven: <31 mmHg — CL (ref 32.0–45.0)

## 2018-09-06 LAB — BASIC METABOLIC PANEL
Anion gap: 13 (ref 5–15)
BUN: 28 mg/dL — ABNORMAL HIGH (ref 8–23)
CO2: 19 mmol/L — ABNORMAL LOW (ref 22–32)
Calcium: 8.9 mg/dL (ref 8.9–10.3)
Chloride: 105 mmol/L (ref 98–111)
Creatinine, Ser: 1.24 mg/dL — ABNORMAL HIGH (ref 0.44–1.00)
GFR calc non Af Amer: 43 mL/min — ABNORMAL LOW (ref >60)
GFR, EST AFRICAN AMERICAN: 50 mL/min — AB (ref >60)
Glucose, Bld: 370 mg/dL — ABNORMAL HIGH (ref 70–99)
Potassium: 4 mmol/L (ref 3.5–5.1)
Sodium: 137 mmol/L (ref 135–145)

## 2018-09-06 LAB — CK: Total CK: 72 U/L (ref 38–234)

## 2018-09-06 LAB — GLUCOSE, CAPILLARY
Glucose-Capillary: 52 mg/dL — ABNORMAL LOW (ref 70–99)
Glucose-Capillary: 96 mg/dL (ref 70–99)

## 2018-09-06 LAB — CBG MONITORING, ED: Glucose-Capillary: 383 mg/dL — ABNORMAL HIGH (ref 70–99)

## 2018-09-06 MED ORDER — ENOXAPARIN SODIUM 40 MG/0.4ML ~~LOC~~ SOLN: 40.0000 mg | SUBCUTANEOUS | Status: DC

## 2018-09-06 MED ORDER — LEVETIRACETAM 500 MG PO TABS
500.0000 mg | ORAL_TABLET | Freq: Two times a day (BID) | ORAL | Status: DC
  Administered 2018-09-06 – 2018-09-10 (×8): 500 mg via ORAL
  Filled 2018-09-06 (×8): qty 1

## 2018-09-06 MED ORDER — ACETAMINOPHEN 325 MG PO TABS
650.0000 mg | ORAL_TABLET | Freq: Once | ORAL | Status: AC
  Administered 2018-09-06: 650 mg via ORAL
  Filled 2018-09-06: qty 2

## 2018-09-06 MED ORDER — INSULIN ASPART 100 UNIT/ML ~~LOC~~ SOLN
0.0000 [IU] | Freq: Every day | SUBCUTANEOUS | Status: DC
  Administered 2018-09-07: 3 [IU] via SUBCUTANEOUS

## 2018-09-06 MED ORDER — ACETAMINOPHEN 325 MG PO TABS
650.0000 mg | ORAL_TABLET | ORAL | Status: DC | PRN
  Administered 2018-09-07 – 2018-09-10 (×5): 650 mg via ORAL
  Filled 2018-09-06 (×5): qty 2

## 2018-09-06 MED ORDER — ACETAMINOPHEN 160 MG/5ML PO SOLN: 650.0000 mg | ORAL | Status: DC | PRN

## 2018-09-06 MED ORDER — VITAMIN B-12 1000 MCG PO TABS
1000.0000 ug | ORAL_TABLET | Freq: Every day | ORAL | Status: DC
  Administered 2018-09-06 – 2018-09-10 (×5): 1000 ug via ORAL
  Filled 2018-09-06 (×5): qty 1

## 2018-09-06 MED ORDER — INSULIN REGULAR HUMAN (CONC) 500 UNIT/ML ~~LOC~~ SOPN
15.0000 [IU] | PEN_INJECTOR | Freq: Two times a day (BID) | SUBCUTANEOUS | Status: DC
  Administered 2018-09-07 – 2018-09-08 (×2): 15 [IU] via SUBCUTANEOUS
  Filled 2018-09-06: qty 3

## 2018-09-06 MED ORDER — LACTATED RINGERS IV BOLUS
1000.0000 mL | Freq: Once | INTRAVENOUS | Status: AC
  Administered 2018-09-06: 1000 mL via INTRAVENOUS

## 2018-09-06 MED ORDER — SODIUM CHLORIDE 0.9 % IV BOLUS
1000.0000 mL | Freq: Once | INTRAVENOUS | Status: AC
  Administered 2018-09-06: 1000 mL via INTRAVENOUS

## 2018-09-06 MED ORDER — STROKE: EARLY STAGES OF RECOVERY BOOK
Freq: Once | Status: AC
  Administered 2018-09-07: 19:00:00

## 2018-09-06 MED ORDER — ACETAMINOPHEN 650 MG RE SUPP: 650.0000 mg | RECTAL | Status: DC | PRN

## 2018-09-06 MED ORDER — GADOBUTROL 1 MMOL/ML IV SOLN
9.0000 mL | Freq: Once | INTRAVENOUS | Status: AC | PRN
  Administered 2018-09-06: 9 mL via INTRAVENOUS

## 2018-09-06 MED ORDER — PAROXETINE HCL 20 MG PO TABS
40.0000 mg | ORAL_TABLET | Freq: Every day | ORAL | Status: DC
  Administered 2018-09-06 – 2018-09-10 (×5): 40 mg via ORAL
  Filled 2018-09-06 (×5): qty 2

## 2018-09-06 MED ORDER — TORSEMIDE 20 MG PO TABS
20.0000 mg | ORAL_TABLET | Freq: Two times a day (BID) | ORAL | Status: DC
  Administered 2018-09-06 – 2018-09-10 (×8): 20 mg via ORAL
  Filled 2018-09-06 (×8): qty 1

## 2018-09-06 MED ORDER — VITAMIN C 500 MG PO TABS
500.0000 mg | ORAL_TABLET | Freq: Every day | ORAL | Status: DC
  Administered 2018-09-06 – 2018-09-10 (×5): 500 mg via ORAL
  Filled 2018-09-06 (×5): qty 1

## 2018-09-06 MED ORDER — LEVOTHYROXINE SODIUM 125 MCG PO TABS
125.0000 ug | ORAL_TABLET | Freq: Every day | ORAL | Status: DC
  Administered 2018-09-07 – 2018-09-10 (×4): 125 ug via ORAL
  Filled 2018-09-06 (×4): qty 1

## 2018-09-06 MED ORDER — GLUCOSE 40 % PO GEL
ORAL | Status: AC
  Administered 2018-09-06: 21:00:00
  Filled 2018-09-06: qty 1

## 2018-09-06 MED ORDER — SIMVASTATIN 20 MG PO TABS
20.0000 mg | ORAL_TABLET | Freq: Every day | ORAL | Status: DC
  Administered 2018-09-06: 20 mg via ORAL
  Filled 2018-09-06: qty 1

## 2018-09-06 MED ORDER — INSULIN ASPART 100 UNIT/ML ~~LOC~~ SOLN
0.0000 [IU] | Freq: Three times a day (TID) | SUBCUTANEOUS | Status: DC
  Administered 2018-09-07: 15 [IU] via SUBCUTANEOUS

## 2018-09-06 MED ORDER — PANTOPRAZOLE SODIUM 40 MG PO TBEC
40.0000 mg | DELAYED_RELEASE_TABLET | Freq: Every day | ORAL | Status: DC
  Administered 2018-09-06 – 2018-09-10 (×5): 40 mg via ORAL
  Filled 2018-09-06 (×5): qty 1

## 2018-09-06 NOTE — Progress Notes (Signed)
Pt neuro assessment change. Right pupil 3 and reactive. Left pupil 2 and reactive. Left side facial droop. Left side weakness from  5 to 4 strength. Speech slurred. Drowsy at times. Has been eating since  Admit. CBG 65 and 52. SL glucose given. B/P 171/ 59. Dr. Nehemiah Settle notified and coming to bedside. Hold Lovenox.

## 2018-09-06 NOTE — ED Notes (Signed)
CRITICAL VALUE ALERT  Critical Value:  PO2 < 31   Date & Time Notied:  09/06/2018 1332   Provider Notified: Dr. Regenia Skeeter   Orders Received/Actions taken: None yet

## 2018-09-06 NOTE — ED Provider Notes (Signed)
[facility] EMERGENCY DEPARTMENT Provider Note   CSN: [id number] Arrival date & time: 09/06/18  1150    History   Chief Complaint Chief Complaint  Patient presents with  . Loss of Consciousness    HPI Jaclyn Herrera is a 73 y.o. female.     HPI 73 year old female presents with hyperglycemia and fall.  Patient states that last night she was trying to sit in a chair and sat on the arm of the chair and it toppled over causing her to fall.  She does think she hit the right side of her head and then shortly thereafter passed out.  She also injured her left arm/elbow with an abrasion/skin tear and thinks the pain of this might of caused her to pass out.  She is been feeling weak and having blurry vision for 2 weeks.  She states this started after she was given prednisone by her PCP for gout.  Her glucose has been running high since then.  She denies chest pain, shortness of breath, fever, vomiting or abdominal pain.  No urinary symptoms.  Has been having bilateral leg weakness.  The blurry vision is new.  She has a mild right-sided headache since the fall.  No neck pain or back pain.  She states her arm hurts where she has the mild skin tear but no deeper pain.  Past Medical History:  Diagnosis Date  . 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 Arthritis   . Bronchitis   . CHF (congestive heart failure) ([place])   . Chronic diastolic heart failure ([place]) 07/13/2015  . COPD (chronic obstructive pulmonary disease) ([place])   . Depression   . Diabetes mellitus   . DKA (diabetic ketoacidoses) ([place]) 09/27/2014  . GERD (gastroesophageal reflux disease)   . Headache   . Hyperlipidemia   . Hypertension   . Hypothyroid 10/31/2014  . hypothyroidism   . Pneumonia    hx of   . PONV (postoperative nausea and vomiting)    and slow to wake up after foot surgery   . Seizures ([place])    2017  . Shingles   . Syncope 10/31/2014  . Tubular adenoma of colon   . Vertigo 10/31/2014    Patient  Active Problem List   Diagnosis Date Noted  . Chronic diastolic CHF (congestive heart failure) ([place]) 05/30/2017  . Noncompliance with medication regimen   . 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 Closed nondisplaced fracture of proximal phalanx of great toe   . Seizure ([place]) 01/17/2016  . Type 2 diabetes mellitus with left diabetic foot ulcer ([place]) 11/03/2015  . Hypoglycemia associated with diabetes ([place]) 08/13/2015  . Hypothermia 08/13/2015  . Fall at home 08/13/2015  . UTI (urinary tract infection) 08/13/2015  . Chronic diastolic heart failure ([place]) 07/13/2015  . Pleural effusion 05/03/2015  . Calcaneal fracture 12/29/2014  . Calcaneus fracture 12/29/2014  . Headache 10/31/2014  . Headache, migraine, intractable 10/31/2014  . Syncope 10/31/2014  . Vertigo 10/31/2014  . Poorly controlled diabetes mellitus ([place]) 10/31/2014  . Essential hypertension 10/31/2014  . HLD (hyperlipidemia) 10/31/2014  . Depression 10/31/2014  . GERD (gastroesophageal reflux disease) 10/31/2014  . Hypothyroid 10/31/2014  . Neck pain   . DKA (diabetic ketoacidoses) ([place]) 09/27/2014  . Nausea and vomiting 09/27/2014  . Diarrhea 09/27/2014  . Hyperkalemia 09/27/2014  . AKI (acute kidney injury) ([place]) 09/27/2014  . Diabetes mellitus ([place]) 09/27/2014  . Chest pain 04/30/2013  . Dyspnea  04/30/2013  . DIABETES MELLITUS, TYPE II 12/18/2008  . GERD 12/18/2008  . CONSTIPATION, CHRONIC 12/18/2008  . DYSPHAGIA 12/18/2008  . COLONIC POLYPS, HX OF 12/18/2008    Past Surgical History:  Procedure Laterality Date  . ANKLE SURGERY     x 4  . APPENDECTOMY    . CATARACT EXTRACTION Bilateral   . CATARACT EXTRACTION W/ INTRAOCULAR LENS IMPLANT    . CHOLECYSTECTOMY    . COLONOSCOPY W/ BIOPSIES AND POLYPECTOMY    . ESOPHAGOGASTRODUODENOSCOPY    . HARDWARE REMOVAL Left 11/03/2015   Procedure: HARDWARE REMOVAL LEFT FOOT appliction of wound vac;  Surgeon: Gaynelle Arabian, MD;  Location: [facility]S;  Service: Orthopedics;   Laterality: Left;  . INCISION AND DRAINAGE OF WOUND Left 11/03/2015   Procedure: IRRIGATION AND DEBRIDEMENT LEFT FOOT ULCER;  Surgeon: Gaynelle Arabian, MD;  Location: [facility];  Service: Orthopedics;  Laterality: Left;  . KNEE ARTHROSCOPY    . NASAL SINUS SURGERY    . non cancerous mass removed     small intestine  . oophrectomy    . ORIF CALCANEOUS FRACTURE Left 12/29/2014   Procedure: OPEN REDUCTION INTERNAL FIXATION  LEFT CALCANEOUS FRACTURE;  Surgeon: Gaynelle Arabian, MD;  Location: [facility];  Service: Orthopedics;  Laterality: Left;  . TONSILLECTOMY       OB History   No obstetric history on file.      Home Medications    Prior to Admission medications   Medication Sig Start Date End Date Taking? Authorizing Provider  acetaminophen (TYLENOL) 500 MG tablet Take 500-1,000 mg by mouth every 6 (six) hours as needed (For pain.).     [provider]  acetaminophen-codeine (TYLENOL #3) 300-30 MG tablet  11/16/16   [provider]  Ascorbic Acid (VITAMIN C PO) Take 2 tablets by mouth daily.    [provider]  carvedilol (COREG) 6.25 MG tablet Take 6.25 mg by mouth 2 (two) times daily. 07/21/16   [provider]  colchicine 0.6 MG tablet Take 1 tablet (0.6 mg total) by mouth daily as needed. DO NOT TAKE WHILE TAKING FLUCONAZOLE 01/11/17   Gatha Mayer, MD  esomeprazole (NEXIUM) 20 MG capsule Take 20 mg by mouth daily.     [provider]  fluticasone (FLONASE) 50 MCG/ACT nasal spray Place 2 sprays into the nose 2 (two) times daily as needed for allergies.  10/28/14   [provider]  HUMULIN R 500 UNIT/ML injection Inject 14 units into the skin twice daily. Inject 9 units into the skin daily at bedtime. 06/13/16   [provider]  levETIRAcetam (KEPPRA) 500 MG tablet Take 1 tablet (500 mg total) by mouth 2 (two) times daily. 01/21/16   Asencion Noble, MD  levothyroxine (SYNTHROID, LEVOTHROID) 125 MCG tablet Take 125 mcg by mouth daily before  breakfast.  06/13/16   [provider]  PARoxetine (PAXIL) 40 MG tablet Take 40 mg by mouth daily.    [provider]  Polyethylene Glycol 400 (BLINK TEARS OP) Place 1 drop into both eyes 4 (four) times daily as needed (Dry eyes).     [provider]  potassium chloride (K-DUR) 10 MEQ tablet TAKE 1 TABLET BY MOUTH ONCE DAILY 12/06/17   Burnell Blanks, MD  quinapril (ACCUPRIL) 40 MG tablet Take 40 mg by mouth daily.     [provider]  Senna-Psyllium (PERDIEM PO) Take 1 tablet by mouth daily as needed (For constipation.).     [provider]  simvastatin (  ZOCOR) 20 MG tablet Take 1 tablet (20 mg total) by mouth at bedtime. DO NOT TAKE WHILE TAKING FLUCONAZOLE 01/11/17   Gatha Mayer, MD  torsemide (DEMADEX) 20 MG tablet Take 20 mg by mouth 2 (two) times daily.    [provider]  vitamin B-12 (CYANOCOBALAMIN) 1000 MCG tablet Take 1,000 mcg by mouth daily.    [provider]    Family History Family History  Problem Relation Age of Onset  . Diabetes Mother   . Asthma Mother   . Hypertension Father   . Heart disease Father   . Diabetes Father   . Heart disease Sister   . Hypertension Sister   . Asthma Sister   . Diabetes Brother   . CAD Brother   . Stroke Brother   . Stomach cancer Maternal Aunt     Social History Social History   Tobacco Use  . Smoking status: Never Smoker  . Smokeless tobacco: Never Used  Substance Use Topics  . Alcohol use: No  . Drug use: No     Allergies   Sulfonamide derivatives; Bactrim [sulfamethoxazole-trimethoprim]; Erythromycin; Latex; Mucinex [guaifenesin er]; Aspirin; Levofloxacin; and Tape   Review of Systems Review of Systems  Constitutional: Negative for fever.  Eyes: Positive for visual disturbance.  Respiratory: Negative for cough and shortness of breath.   Cardiovascular: Negative for chest pain.  Gastrointestinal: Negative for abdominal pain and vomiting.   Genitourinary: Negative for dysuria.  Musculoskeletal: Negative for back pain and neck pain.  Skin: Positive for wound.  Neurological: Positive for weakness and headaches. Negative for dizziness.  All other systems reviewed and are negative.    Physical Exam Updated Vital Signs BP (!) 191/63   Pulse 73   Temp 99.4 F (37.4 C) (Oral)   Resp (!) 26   Ht 5\' 8"  (1.727 m)   Wt 90.7 kg   SpO2 100%   BMI 30.41 kg/m   Physical Exam Vitals signs and nursing note reviewed.  Constitutional:      Appearance: She is well-developed. She is obese.  HENT:     Head: Normocephalic and atraumatic.     Right Ear: External ear normal.     Left Ear: External ear normal.     Nose: Nose normal.     Mouth/Throat:     Mouth: Mucous membranes are dry.  Eyes:     General:        Right eye: No discharge.        Left eye: No discharge.  Neck:     Musculoskeletal: Neck supple. No muscular tenderness.  Cardiovascular:     Rate and Rhythm: Normal rate and regular rhythm.     Heart sounds: Normal heart sounds.  Pulmonary:     Effort: Pulmonary effort is normal.     Breath sounds: Normal breath sounds.  Abdominal:     Palpations: Abdomen is soft.     Tenderness: There is no abdominal tenderness.  Musculoskeletal:     Left elbow: She exhibits laceration. She exhibits normal range of motion. No tenderness found.     Left upper arm: She exhibits no tenderness.     Left forearm: She exhibits no tenderness.       Arms:  Skin:    General: Skin is warm and dry.  Neurological:     Mental Status: She is alert and oriented to person, place, and time.     Comments: CN 3-12 grossly intact. 5/5 strength in all 4 extremities.  Grossly normal sensation.   Psychiatric:        Mood and Affect: Mood is not anxious.      ED Treatments / Results  Labs (all labs ordered are listed, but only abnormal results are displayed) Labs Reviewed  BASIC METABOLIC PANEL - Abnormal; Notable for the following  components:      Result Value   CO2 19 (*)    Glucose, Bld 370 (*)    BUN 28 (*)    Creatinine, Ser 1.24 (*)    GFR calc non Af Amer 43 (*)    GFR calc Af Amer 50 (*)    All other components within normal limits  CBC WITH DIFFERENTIAL/PLATELET - Abnormal; Notable for the following components:   RBC 3.86 (*)    Hemoglobin 11.1 (*)    HCT 32.6 (*)    All other components within normal limits  BLOOD GAS, VENOUS - Abnormal; Notable for the following components:   pCO2, Ven 39.4 (*)    pO2, Ven <31.0 (*)    Acid-base deficit 3.6 (*)    All other components within normal limits  CBG MONITORING, ED - Abnormal; Notable for the following components:   Glucose-Capillary 383 (*)    All other components within normal limits  CK  URINALYSIS, ROUTINE W REFLEX MICROSCOPIC    EKG EKG Interpretation  Date/Time:  Friday September 06 2018 12:06:06 EDT Ventricular Rate:  89 PR Interval:    QRS Duration: 85 QT Interval:  400 QTC Calculation: 487 R Axis:   17 Text Interpretation:  Normal sinus rhythm Abnormal R-wave progression, early transition Borderline prolonged QT interval Baseline wander in lead(s) V6 no significant change since Aug 2017 Confirmed by Sherwood Gambler [phone]) on 09/06/2018 12:38:29 PM   Radiology Ct Head Wo Contrast  Result Date: 09/06/2018 CLINICAL DATA:  Syncope with recent fall EXAM: CT HEAD WITHOUT CONTRAST TECHNIQUE: Contiguous axial images were obtained from the base of the skull through the vertex without intravenous contrast. COMPARISON:  May 30, 2017 FINDINGS: Brain: There is mild diffuse atrophy. There is decreased attenuation throughout much of the right posteromedial temporal and right medial and posterior occipital lobes. There is relative sparing of the periphery of the right temporal and occipital lobes. There is no demonstrable mass, hemorrhage, extra-axial fluid collection, or midline shift. Brain parenchyma elsewhere appears unremarkable. Vascular: There is  no hyperdense vessel appreciable. There is calcification in each carotid siphon region. Skull: Bony calvarium appears intact. Sinuses/Orbits: There is opacification of multiple ethmoid air cells. There is mucosal thickening in the left sphenoid sinus. There is edema in each nasal turbinates. Orbits appear symmetric bilaterally. Other: Mastoid air cells are clear. IMPRESSION: Acute appearing infarct involving a portion of the right posterior cerebral artery distribution. Specifically, there is cytotoxic edema throughout portions of the medial and posterior right temporal and right occipital lobe with sparing of a portion of the periphery of the right occipital lobe. No other acute appearing infarct evident. No mass or hemorrhage. Foci of arterial vascular calcification noted. Areas of paranasal sinus disease noted. Electronically Signed   By: Lowella Grip III M.D.   On: 09/06/2018 14:09    Procedures Procedures (including critical care time)  Medications Ordered in ED Medications  sodium chloride 0.9 % bolus 1,000 mL (0 mLs Intravenous Stopped 09/06/18 1444)  acetaminophen (TYLENOL) tablet 650 mg (650 mg Oral Given 09/06/18 1301)  lactated ringers bolus 1,000 mL (1,000 mLs Intravenous New Bag/Given 09/06/18 1444)     Initial Impression /  Assessment and Plan / ED Course  I have reviewed the triage vital signs and the nursing notes.  Pertinent labs & imaging results that were available during my care of the patient were reviewed by me and considered in my medical decision making (see chart for details).        Patient's labs show hyperglycemia but no DKA at this time.  However her CT head, which was mostly obtained for the fall shows stroke in the right PCA distribution.  Some edema noted.  She does endorse that she had a hard time getting up because of weakness and on reexamination seems to have a left field cut.  I discussed with Dr. Merlene Laughter who feels patient can stay here at [facility].   Advises MRI and MRA.  Admit to the hospitalist service.  Final Clinical Impressions(s) / ED Diagnoses   Final diagnoses:  Occipital stroke [facility])  Hyperglycemia    ED Discharge Orders    None       Sherwood Gambler, MD 09/06/18 1537

## 2018-09-06 NOTE — Progress Notes (Signed)
Dr. Nehemiah Settle at bedside. Current b/p systolic 175. No new orders. Continue to monitor.

## 2018-09-06 NOTE — H&P (Signed)
History and Physical  Jaclyn Herrera:[id number] DOB: May 12, 1946 DOA: 09/06/2018  Referring physician: Dr Regenia Skeeter, ED physician PCP: Asencion Noble, MD  Outpatient Specialists:   Patient Coming From: home  Chief Complaint: Fall yesterday, visual field deficit  HPI: Jaclyn Herrera is a 73 y.o. female with a history of diabetes, hypertension, CHF, and GERD, seizure disorder.  Patient seen after a fall yesterday.  She was trying to sit on a chair, but caught the arm and it toppled over causing her to fall.  She has an abrasion on her elbow and felt like she lost consciousness due to the pain of her abrasion.  She has had weak and blurred vision for the past 2 weeks after receiving prednisone for gout 2 weeks ago.  Denies chest pain, shortness of breath, fever, nausea, vomiting.  She does complain of some bilateral leg weakness and deficit in the left part of her vision.  Emergency Department Course: CT shows acute stroke.  MRA shows large right PCA territory infarct with defects in the posterior medial temporal lobe, occipital lobe and right thalamus.  Review of Systems:   Pt denies any fevers, chills, nausea, vomiting, diarrhea, constipation, abdominal pain, shortness of breath, dyspnea on exertion, orthopnea, cough, wheezing, palpitations, headache, vision changes, lightheadedness, dizziness, melena, rectal bleeding.  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    Arthritis    Bronchitis    CHF (congestive heart failure) (HCC)    Chronic diastolic heart failure ([place]) 07/13/2015   COPD (chronic obstructive pulmonary disease) (HCC)    Depression    Diabetes mellitus    DKA (diabetic ketoacidoses) ([place]) 09/27/2014   GERD (gastroesophageal reflux disease)    Headache    Hyperlipidemia    Hypertension    Hypothyroid 10/31/2014   hypothyroidism    Pneumonia    hx of    PONV  (postoperative nausea and vomiting)    and slow to wake up after foot surgery    Seizures ([place])    2017   Shingles    Syncope 10/31/2014   Tubular adenoma of colon    Vertigo 10/31/2014   Past Surgical History:  Procedure Laterality Date   ANKLE SURGERY     x 4   APPENDECTOMY     CATARACT EXTRACTION Bilateral    CATARACT EXTRACTION W/ INTRAOCULAR LENS IMPLANT     CHOLECYSTECTOMY     COLONOSCOPY W/ BIOPSIES AND POLYPECTOMY     ESOPHAGOGASTRODUODENOSCOPY     HARDWARE REMOVAL Left 11/03/2015   Procedure: HARDWARE REMOVAL LEFT FOOT appliction of wound vac;  Surgeon: Gaynelle Arabian, MD;  Location: [facility];  Service: Orthopedics;  Laterality: Left;   INCISION AND DRAINAGE OF WOUND Left 11/03/2015   Procedure: IRRIGATION AND DEBRIDEMENT LEFT FOOT ULCER;  Surgeon: Gaynelle Arabian, MD;  Location: [facility];  Service: Orthopedics;  Laterality: Left;   KNEE ARTHROSCOPY     NASAL SINUS SURGERY     non cancerous mass removed     small intestine   oophrectomy     ORIF CALCANEOUS FRACTURE Left 12/29/2014   Procedure: OPEN REDUCTION INTERNAL FIXATION  LEFT CALCANEOUS FRACTURE;  Surgeon: Gaynelle Arabian, MD;  Location: [facility];  Service: Orthopedics;  Laterality: Left;   TONSILLECTOMY     Social History:  reports that she has never smoked. She has never used smokeless tobacco. She reports that she does not drink alcohol  or use drugs. Patient lives at home  Allergies  Allergen Reactions   Sulfonamide Derivatives Anaphylaxis   Bactrim [Sulfamethoxazole-Trimethoprim] Nausea Only   Erythromycin Nausea And Vomiting   Latex Itching and Other (See Comments)    If she wears gloves too long her hands start itching   Mucinex [Guaifenesin Er] Nausea And Vomiting   Aspirin Rash   Levofloxacin Rash   Tape Rash    Family History  Problem Relation Age of Onset   Diabetes Mother    Asthma Mother    Hypertension Father    Heart disease Father    Diabetes Father    Heart  disease Sister    Hypertension Sister    Asthma Sister    Diabetes Brother    CAD Brother    Stroke Brother    Stomach cancer Maternal Aunt       Prior to Admission medications   Medication Sig Start Date End Date Taking? Authorizing Provider  acetaminophen (TYLENOL) 500 MG tablet Take 500-1,000 mg by mouth every 6 (six) hours as needed (For pain.).     [provider]  acetaminophen-codeine (TYLENOL #3) 300-30 MG tablet  11/16/16   [provider]  Ascorbic Acid (VITAMIN C PO) Take 2 tablets by mouth daily.    [provider]  carvedilol (COREG) 6.25 MG tablet Take 6.25 mg by mouth 2 (two) times daily. 07/21/16   [provider]  colchicine 0.6 MG tablet Take 1 tablet (0.6 mg total) by mouth daily as needed. DO NOT TAKE WHILE TAKING FLUCONAZOLE 01/11/17   Gatha Mayer, MD  esomeprazole (NEXIUM) 20 MG capsule Take 20 mg by mouth daily.     [provider]  fluticasone (FLONASE) 50 MCG/ACT nasal spray Place 2 sprays into the nose 2 (two) times daily as needed for allergies.  10/28/14   [provider]  HUMULIN R 500 UNIT/ML injection Inject 14 units into the skin twice daily. Inject 9 units into the skin daily at bedtime. 06/13/16   [provider]  levETIRAcetam (KEPPRA) 500 MG tablet Take 1 tablet (500 mg total) by mouth 2 (two) times daily. 01/21/16   Asencion Noble, MD  levothyroxine (SYNTHROID, LEVOTHROID) 125 MCG tablet Take 125 mcg by mouth daily before breakfast.  06/13/16   [provider]  PARoxetine (PAXIL) 40 MG tablet Take 40 mg by mouth daily.    [provider]  Polyethylene Glycol 400 (BLINK TEARS OP) Place 1 drop into both eyes 4 (four) times daily as needed (Dry eyes).     [provider]  potassium chloride (K-DUR) 10 MEQ tablet TAKE 1 TABLET BY MOUTH ONCE DAILY 12/06/17   Burnell Blanks, MD  quinapril (ACCUPRIL) 40 MG tablet Take 40 mg by mouth daily.     [provider]   Senna-Psyllium (PERDIEM PO) Take 1 tablet by mouth daily as needed (For constipation.).     [provider]  simvastatin (ZOCOR) 20 MG tablet Take 1 tablet (20 mg total) by mouth at bedtime. DO NOT TAKE WHILE TAKING FLUCONAZOLE 01/11/17   Gatha Mayer, MD  torsemide (DEMADEX) 20 MG tablet Take 20 mg by mouth 2 (two) times daily.    [provider]  vitamin B-12 (CYANOCOBALAMIN) 1000 MCG tablet Take 1,000 mcg by mouth daily.    [provider]    Physical Exam: BP (!) 164/61    Pulse 70    Temp 99.4 F (37.4 C) (Oral)    Resp 17  Ht 5\' 8"  (1.727 m)    Wt 90.7 kg    SpO2 100%    BMI 30.41 kg/m    General: Elderly Caucasian female. Awake and alert and oriented x3. No acute cardiopulmonary distress.   HEENT: Normocephalic atraumatic.  Right and left ears normal in appearance.  Pupils equal, round, reactive to light. Extraocular muscles are intact. Sclerae anicteric and noninjected.  Moist mucosal membranes. No mucosal lesions.   Neck: Neck supple without lymphadenopathy. No carotid bruits. No masses palpated.   Cardiovascular: Regular rate with normal S1-S2 sounds. No murmurs, rubs, gallops auscultated. No JVD.   Respiratory: Good respiratory effort with no wheezes, rales, rhonchi. Lungs clear to auscultation bilaterally.  No accessory muscle use.  Abdomen: Soft, nontender, nondistended. Active bowel sounds. No masses or hepatosplenomegaly   Skin: No rashes, lesions, or ulcerations.  Dry, warm to touch. 2+ dorsalis pedis and radial pulses.  Musculoskeletal: No calf or leg pain. All major joints not erythematous nontender.  No upper or lower joint deformation.  Good ROM.  No contractures   Psychiatric: Intact judgment and insight. Pleasant and cooperative.  Neurologic: Patient has loss of visual field in the left peripheral vision.  Strength is 5/5 and symmetric in upper and lower extremities.  Cranial nerves II through XII are grossly intact.            Labs on Admission: I have personally reviewed following labs and imaging studies  CBC: Recent Labs  Lab 09/06/18 1317  WBC 7.0  NEUTROABS 5.7  HGB 11.1*  HCT 32.6*  MCV 84.5  PLT 350   Basic Metabolic Panel: Recent Labs  Lab 09/06/18 1317  NA 137  K 4.0  CL 105  CO2 19*  GLUCOSE 370*  BUN 28*  CREATININE 1.24*  CALCIUM 8.9   GFR: Estimated Creatinine Clearance: 47.6 mL/min (A) (by C-G formula based on SCr of 1.24 mg/dL (H)). Liver Function Tests: No results for input(s): AST, ALT, ALKPHOS, BILITOT, PROT, ALBUMIN in the last 168 hours. No results for input(s): LIPASE, AMYLASE in the last 168 hours. No results for input(s): AMMONIA in the last 168 hours. Coagulation Profile: No results for input(s): INR, PROTIME in the last 168 hours. Cardiac Enzymes: Recent Labs  Lab 09/06/18 1317  CKTOTAL 72   BNP (last 3 results) No results for input(s): PROBNP in the last 8760 hours. HbA1C: No results for input(s): HGBA1C in the last 72 hours. CBG: Recent Labs  Lab 09/06/18 1250  GLUCAP 383*   Lipid Profile: No results for input(s): CHOL, HDL, LDLCALC, TRIG, CHOLHDL, LDLDIRECT in the last 72 hours. Thyroid Function Tests: No results for input(s): TSH, T4TOTAL, FREET4, T3FREE, THYROIDAB in the last 72 hours. Anemia Panel: No results for input(s): VITAMINB12, FOLATE, FERRITIN, TIBC, IRON, RETICCTPCT in the last 72 hours. Urine analysis:    Component Value Date/Time   COLORURINE STRAW (A) 05/30/2017 0110   APPEARANCEUR CLEAR 05/30/2017 0110   LABSPEC 1.017 05/30/2017 0110   PHURINE 5.0 05/30/2017 0110   GLUCOSEU >=500 (A) 05/30/2017 0110   HGBUR SMALL (A) 05/30/2017 0110   BILIRUBINUR NEGATIVE 05/30/2017 0110   KETONESUR 80 (A) 05/30/2017 0110   PROTEINUR 30 (A) 05/30/2017 0110   UROBILINOGEN 0.2 12/29/2014 1717   NITRITE NEGATIVE 05/30/2017 0110   LEUKOCYTESUR NEGATIVE 05/30/2017 0110   Sepsis Labs: @LABRCNTIP (procalcitonin:4,lacticidven:4) )No results  found for this or any previous visit (from the past 240 hour(s)).   Radiological Exams on Admission: Ct Head Wo Contrast  Result Date: 09/06/2018 CLINICAL  DATA:  Syncope with recent fall EXAM: CT HEAD WITHOUT CONTRAST TECHNIQUE: Contiguous axial images were obtained from the base of the skull through the vertex without intravenous contrast. COMPARISON:  May 30, 2017 FINDINGS: Brain: There is mild diffuse atrophy. There is decreased attenuation throughout much of the right posteromedial temporal and right medial and posterior occipital lobes. There is relative sparing of the periphery of the right temporal and occipital lobes. There is no demonstrable mass, hemorrhage, extra-axial fluid collection, or midline shift. Brain parenchyma elsewhere appears unremarkable. Vascular: There is no hyperdense vessel appreciable. There is calcification in each carotid siphon region. Skull: Bony calvarium appears intact. Sinuses/Orbits: There is opacification of multiple ethmoid air cells. There is mucosal thickening in the left sphenoid sinus. There is edema in each nasal turbinates. Orbits appear symmetric bilaterally. Other: Mastoid air cells are clear. IMPRESSION: Acute appearing infarct involving a portion of the right posterior cerebral artery distribution. Specifically, there is cytotoxic edema throughout portions of the medial and posterior right temporal and right occipital lobe with sparing of a portion of the periphery of the right occipital lobe. No other acute appearing infarct evident. No mass or hemorrhage. Foci of arterial vascular calcification noted. Areas of paranasal sinus disease noted. Electronically Signed   By: Lowella Grip III M.D.   On: 09/06/2018 14:09   Mr Jodene Nam Head Wo Contrast  Result Date: 09/06/2018 CLINICAL DATA:  Syncopal episode.  Blurry vision. EXAM: MRI HEAD WITHOUT AND WITH CONTRAST AND MRA HEAD WITHOUT CONTRAST AND MRI NECK WITHOUT AND WITH CONTRAST TECHNIQUE: Multiplanar,  multiecho pulse sequences of the brain and surrounding structures were obtained without and with intravenous contrast. Angiographic images of the head were obtained using MRA technique without and with contrast. Multiplanar, multiecho pulse sequences of the neck and surrounding structures were obtained without and with intravenous contrast. CONTRAST:  Gadavist 9 mL. COMPARISON:  CT head earlier today. FINDINGS: MRI HEAD FINDINGS Brain: Large acute/early subacute RIGHT PCA territory infarct affects the posterior and medial temporal lobe, and occipital lobe. Punctate areas of acute infarction affect the RIGHT thalamus. No brainstem or cerebellar involvement. Mild T1 and T2 shortening, gyriform pattern, affecting the RIGHT occipital lobe, suggests early reperfusion hemorrhage. Only slight postcontrast enhancement. No midline shift. Mild cerebral and cerebellar atrophy. Mild subcortical and periventricular T2 and FLAIR hyperintensities, likely chronic microvascular ischemic change. Vascular: Reported separately Skull and upper cervical spine: Unremarkable Sinuses/Orbits: Mucosal thickening.  BILATERAL cataract extraction Other: None MRA HEAD FINDINGS The internal carotid arteries are widely patent. The basilar artery is widely patent with vertebrals codominant although there may be mild nonstenotic irregularity of the distal RIGHT vertebral SP at the vertebrobasilar junction. No proximal stenosis of the middle cerebral arteries. Non dominant LEFT A1 anterior cerebral artery. The RIGHT P1 PCA is completely occluded at its origin. Faint attempts at collateralization are seen from the RIGHT PCOM. No cerebellar branch occlusion. No saccular aneurysm. MRA NECK FINDINGS Conventional branching of the great vessels from the arch. No proximal stenosis. Carotid bifurcations demonstrate no flow reducing lesion. The common carotid arteries and internal carotid arteries are widely patent without evidence of dissection or vasculitis.  No fibromuscular change. No visible ostial stenosis of the codominant vertebral arteries. Vertebral arteries appear widely patent throughout the neck into the intracranial segments to the basilar confluence. IMPRESSION: Large acute to early subacute RIGHT PCA territory infarct affecting the posteromedial temporal lobe, occipital lobe, and RIGHT thalamus. 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 No extracranial stenosis, dissection, or occlusion.  Electronically Signed   By: Staci Righter M.D.   On: 09/06/2018 17:50   Mr Angiogram Neck W Or Wo Contrast  Result Date: 09/06/2018 CLINICAL DATA:  Syncopal episode.  Blurry vision. EXAM: MRI HEAD WITHOUT AND WITH CONTRAST AND MRA HEAD WITHOUT CONTRAST AND MRI NECK WITHOUT AND WITH CONTRAST TECHNIQUE: Multiplanar, multiecho pulse sequences of the brain and surrounding structures were obtained without and with intravenous contrast. Angiographic images of the head were obtained using MRA technique without and with contrast. Multiplanar, multiecho pulse sequences of the neck and surrounding structures were obtained without and with intravenous contrast. CONTRAST:  Gadavist 9 mL. COMPARISON:  CT head earlier today. FINDINGS: MRI HEAD FINDINGS Brain: Large acute/early subacute RIGHT PCA territory infarct affects the posterior and medial temporal lobe, and occipital lobe. Punctate areas of acute infarction affect the RIGHT thalamus. No brainstem or cerebellar involvement. Mild T1 and T2 shortening, gyriform pattern, affecting the RIGHT occipital lobe, suggests early reperfusion hemorrhage. Only slight postcontrast enhancement. No midline shift. Mild cerebral and cerebellar atrophy. Mild subcortical and periventricular T2 and FLAIR hyperintensities, likely chronic microvascular ischemic change. Vascular: Reported separately Skull and upper cervical spine: Unremarkable Sinuses/Orbits: Mucosal thickening.  BILATERAL cataract extraction Other: None MRA HEAD FINDINGS  The internal carotid arteries are widely patent. The basilar artery is widely patent with vertebrals codominant although there may be mild nonstenotic irregularity of the distal RIGHT vertebral SP at the vertebrobasilar junction. No proximal stenosis of the middle cerebral arteries. Non dominant LEFT A1 anterior cerebral artery. The RIGHT P1 PCA is completely occluded at its origin. Faint attempts at collateralization are seen from the RIGHT PCOM. No cerebellar branch occlusion. No saccular aneurysm. MRA NECK FINDINGS Conventional branching of the great vessels from the arch. No proximal stenosis. Carotid bifurcations demonstrate no flow reducing lesion. The common carotid arteries and internal carotid arteries are widely patent without evidence of dissection or vasculitis. No fibromuscular change. No visible ostial stenosis of the codominant vertebral arteries. Vertebral arteries appear widely patent throughout the neck into the intracranial segments to the basilar confluence. IMPRESSION: Large acute to early subacute RIGHT PCA territory infarct affecting the posteromedial temporal lobe, occipital lobe, and RIGHT thalamus. 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 No extracranial stenosis, dissection, or occlusion. Electronically Signed   By: Staci Righter M.D.   On: 09/06/2018 17:50   Mr Jeri Cos And Wo Contrast  Result Date: 09/06/2018 CLINICAL DATA:  Syncopal episode.  Blurry vision. EXAM: MRI HEAD WITHOUT AND WITH CONTRAST AND MRA HEAD WITHOUT CONTRAST AND MRI NECK WITHOUT AND WITH CONTRAST TECHNIQUE: Multiplanar, multiecho pulse sequences of the brain and surrounding structures were obtained without and with intravenous contrast. Angiographic images of the head were obtained using MRA technique without and with contrast. Multiplanar, multiecho pulse sequences of the neck and surrounding structures were obtained without and with intravenous contrast. CONTRAST:  Gadavist 9 mL. COMPARISON:  CT  head earlier today. FINDINGS: MRI HEAD FINDINGS Brain: Large acute/early subacute RIGHT PCA territory infarct affects the posterior and medial temporal lobe, and occipital lobe. Punctate areas of acute infarction affect the RIGHT thalamus. No brainstem or cerebellar involvement. Mild T1 and T2 shortening, gyriform pattern, affecting the RIGHT occipital lobe, suggests early reperfusion hemorrhage. Only slight postcontrast enhancement. No midline shift. Mild cerebral and cerebellar atrophy. Mild subcortical and periventricular T2 and FLAIR hyperintensities, likely chronic microvascular ischemic change. Vascular: Reported separately Skull and upper cervical spine: Unremarkable Sinuses/Orbits: Mucosal thickening.  BILATERAL cataract extraction Other: None MRA HEAD FINDINGS  The internal carotid arteries are widely patent. The basilar artery is widely patent with vertebrals codominant although there may be mild nonstenotic irregularity of the distal RIGHT vertebral SP at the vertebrobasilar junction. No proximal stenosis of the middle cerebral arteries. Non dominant LEFT A1 anterior cerebral artery. The RIGHT P1 PCA is completely occluded at its origin. Faint attempts at collateralization are seen from the RIGHT PCOM. No cerebellar branch occlusion. No saccular aneurysm. MRA NECK FINDINGS Conventional branching of the great vessels from the arch. No proximal stenosis. Carotid bifurcations demonstrate no flow reducing lesion. The common carotid arteries and internal carotid arteries are widely patent without evidence of dissection or vasculitis. No fibromuscular change. No visible ostial stenosis of the codominant vertebral arteries. Vertebral arteries appear widely patent throughout the neck into the intracranial segments to the basilar confluence. IMPRESSION: Large acute to early subacute RIGHT PCA territory infarct affecting the posteromedial temporal lobe, occipital lobe, and RIGHT thalamus. Complete occlusion RIGHT P1  posterior cerebral artery. No extracranial stenosis, dissection, or occlusion. Electronically Signed   By: Staci Righter M.D.   On: 09/06/2018 17:50     Assessment/Plan: Principal Problem:   Acute ischemic stroke [facility]) Active Problems:   Type 2 diabetes mellitus with other specified complication (HCC)   GERD   Essential hypertension   HLD (hyperlipidemia)   GERD (gastroesophageal reflux disease)   Chronic diastolic heart failure (HCC)   Seizure disorder, focal motor [facility])    This patient was discussed with the ED physician, including pertinent vitals, physical exam findings, labs, and imaging.  We also discussed care given by the ED provider.  1. Acute ischemic stroke admit on telemetry Carotid Dopplers  Echocardiogram tomorrow Hemoglobin A1c, lipid panel in the morning PT/OT/speech therapy consult As of hypertension Hold antihypertensives 2. Type 2 diabetes a. Continue home insulin regimen with sliding scale insulin 3. GERD a. Continue antacid therapy 4. Hypertension a. Hold antihypertensives 5. Hyperlipidemia a. Continue lipid therapy 6. Diastolic heart failure a. Continue torsemide 7. Seizure disorder a. Continue antiepileptic  DVT prophylaxis: Lovenox Consultants: None Code Status: Full code Family Communication: None Disposition Plan: We will need more than 48 hours for treatment.  Disposition will depend on findings on PT and OT exam.   Truett Mainland, DO

## 2018-09-06 NOTE — ED Triage Notes (Signed)
Patient brought in by EMS, complaining of syncopal episode last night. States she had steroid shot 2 weeks ago and has had blurry vision since the shot. Complaining of left arm pain from fall from syncopal episode. Per EMS, patient was lying in feces upon arrival. CBG 552.

## 2018-09-07 ENCOUNTER — Encounter (HOSPITAL_COMMUNITY): Payer: Self-pay

## 2018-09-07 ENCOUNTER — Inpatient Hospital Stay (HOSPITAL_COMMUNITY): Payer: Medicare Other

## 2018-09-07 ENCOUNTER — Other Ambulatory Visit: Payer: Self-pay

## 2018-09-07 DIAGNOSIS — E785 Hyperlipidemia, unspecified: Secondary | ICD-10-CM

## 2018-09-07 DIAGNOSIS — G40109 Localization-related (focal) (partial) symptomatic epilepsy and epileptic syndromes with simple partial seizures, not intractable, without status epilepticus: Secondary | ICD-10-CM

## 2018-09-07 DIAGNOSIS — I639 Cerebral infarction, unspecified: Secondary | ICD-10-CM

## 2018-09-07 DIAGNOSIS — I10 Essential (primary) hypertension: Secondary | ICD-10-CM

## 2018-09-07 DIAGNOSIS — K219 Gastro-esophageal reflux disease without esophagitis: Secondary | ICD-10-CM

## 2018-09-07 DIAGNOSIS — E1169 Type 2 diabetes mellitus with other specified complication: Secondary | ICD-10-CM

## 2018-09-07 DIAGNOSIS — Z794 Long term (current) use of insulin: Secondary | ICD-10-CM

## 2018-09-07 DIAGNOSIS — I5032 Chronic diastolic (congestive) heart failure: Secondary | ICD-10-CM

## 2018-09-07 LAB — URINALYSIS, ROUTINE W REFLEX MICROSCOPIC
BILIRUBIN URINE: NEGATIVE
Glucose, UA: >=500 mg/dL — AB
Hgb urine dipstick: NEGATIVE
Ketones, ur: 20 mg/dL — AB
LEUKOCYTE UA: NEGATIVE
Nitrite: NEGATIVE
PROTEIN: NEGATIVE mg/dL
Specific Gravity, Urine: 1.005 (ref 1.005–1.030)
pH: 5 (ref 5.0–8.0)

## 2018-09-07 LAB — HEMOGLOBIN A1C
Hgb A1c MFr Bld: 8.5 % — ABNORMAL HIGH (ref 4.8–5.6)
Mean Plasma Glucose: 197.25 mg/dL

## 2018-09-07 LAB — LIPID PANEL
CHOLESTEROL: 170 mg/dL (ref 0–200)
HDL: 38 mg/dL — ABNORMAL LOW (ref >40)
LDL Cholesterol: 87 mg/dL (ref 0–99)
Total CHOL/HDL Ratio: 4.5 RATIO
Triglycerides: 225 mg/dL — ABNORMAL HIGH (ref <150)
VLDL: 45 mg/dL — ABNORMAL HIGH (ref 0–40)

## 2018-09-07 LAB — MRSA PCR SCREENING: MRSA by PCR: NEGATIVE

## 2018-09-07 LAB — GLUCOSE, CAPILLARY
Glucose-Capillary: 325 mg/dL — ABNORMAL HIGH (ref 70–99)
Glucose-Capillary: 444 mg/dL — ABNORMAL HIGH (ref 70–99)
Glucose-Capillary: 340 mg/dL — ABNORMAL HIGH (ref 70–99)
Glucose-Capillary: 130 mg/dL — ABNORMAL HIGH (ref 70–99)
Glucose-Capillary: 277 mg/dL — ABNORMAL HIGH (ref 70–99)

## 2018-09-07 LAB — ECHOCARDIOGRAM LIMITED BUBBLE STUDY
HEIGHTINCHES: 68 in
Weight: 3200 oz

## 2018-09-07 MED ORDER — HYDRALAZINE HCL 20 MG/ML IJ SOLN
10.0000 mg | Freq: Four times a day (QID) | INTRAMUSCULAR | Status: DC | PRN
  Administered 2018-09-08 – 2018-09-09 (×2): 10 mg via INTRAVENOUS
  Filled 2018-09-07 (×2): qty 1

## 2018-09-07 MED ORDER — CLOPIDOGREL BISULFATE 75 MG PO TABS
75.0000 mg | ORAL_TABLET | Freq: Every day | ORAL | Status: DC
  Administered 2018-09-07 – 2018-09-08 (×2): 75 mg via ORAL
  Filled 2018-09-07 (×2): qty 1

## 2018-09-07 MED ORDER — HYDRALAZINE HCL 20 MG/ML IJ SOLN
10.0000 mg | Freq: Once | INTRAMUSCULAR | Status: AC
  Administered 2018-09-07: 10 mg via INTRAVENOUS

## 2018-09-07 MED ORDER — INSULIN ASPART 100 UNIT/ML ~~LOC~~ SOLN
25.0000 [IU] | Freq: Once | SUBCUTANEOUS | Status: AC
  Administered 2018-09-07: 25 [IU] via SUBCUTANEOUS

## 2018-09-07 MED ORDER — METOPROLOL TARTRATE 5 MG/5ML IV SOLN
5.0000 mg | Freq: Three times a day (TID) | INTRAVENOUS | Status: DC | PRN
  Administered 2018-09-07 – 2018-09-08 (×2): 5 mg via INTRAVENOUS
  Filled 2018-09-07: qty 5

## 2018-09-07 MED ORDER — NITROGLYCERIN IN D5W 200-5 MCG/ML-% IV SOLN: 0.0000 ug/min | INTRAVENOUS | Status: DC

## 2018-09-07 MED ORDER — HYDRALAZINE HCL 20 MG/ML IJ SOLN
INTRAMUSCULAR | Status: AC
  Filled 2018-09-07: qty 1

## 2018-09-07 MED ORDER — NITROGLYCERIN IN D5W 200-5 MCG/ML-% IV SOLN
0.0000 ug/min | INTRAVENOUS | Status: DC
  Administered 2018-09-07: 5 ug/min via INTRAVENOUS
  Filled 2018-09-07: qty 250

## 2018-09-07 MED ORDER — ATORVASTATIN CALCIUM 40 MG PO TABS
40.0000 mg | ORAL_TABLET | Freq: Every day | ORAL | Status: DC
  Administered 2018-09-07 – 2018-09-09 (×3): 40 mg via ORAL
  Filled 2018-09-07 (×3): qty 1

## 2018-09-07 MED ORDER — INSULIN ASPART 100 UNIT/ML ~~LOC~~ SOLN
0.0000 [IU] | Freq: Three times a day (TID) | SUBCUTANEOUS | Status: DC
  Administered 2018-09-08 (×2): 15 [IU] via SUBCUTANEOUS
  Administered 2018-09-08: 3 [IU] via SUBCUTANEOUS
  Administered 2018-09-09 (×2): 15 [IU] via SUBCUTANEOUS
  Administered 2018-09-09: 11 [IU] via SUBCUTANEOUS
  Administered 2018-09-10: 20 [IU] via SUBCUTANEOUS
  Administered 2018-09-10: 11 [IU] via SUBCUTANEOUS

## 2018-09-07 MED ORDER — METOPROLOL TARTRATE 5 MG/5ML IV SOLN
INTRAVENOUS | Status: AC
  Filled 2018-09-07: qty 5

## 2018-09-07 NOTE — Progress Notes (Signed)
*  PRELIMINARY RESULTS* Echocardiogram 2D Echocardiogram limited with bubble has been performed.  Leavy Cella 09/07/2018, 12:19 PM

## 2018-09-07 NOTE — Progress Notes (Signed)
PROGRESS NOTE    Jaclyn Herrera  YHC:[id number] DOB: 1945/09/20 DOA: 09/06/2018 PCP: Asencion Noble, MD    Brief Narrative:  73 year old female admitted to the hospital after a fall and resultant visual field deficit.  She reports having blurred vision for approximately 2 weeks prior to admission and had been feeling weak.  She attributed this to her recent course of prednisone that she had taken for gout.  She also complains of some bilateral leg weakness.  On arrival to the emergency room, CT confirmed establish right PCA infarct with surrounding cerebral edema.  She was admitted to the hospital for further work-up.   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 Acute CVA.  Patient admitted with lower artery infarct.  She is not a candidate for TPA due to delay in arrival.  She was noted to have established infarct with cytotoxic edema on initial CT imaging.  Patient is not on any chronic antiplatelet agent.  She describes an allergy to aspirin.  We will start her on Plavix.  Case was discussed with neurology and concern was expressed regarding embolic features of stroke.  Echocardiogram was unrevealing.  She will need a 30-day event monitor.  She will be started on high-dose statin.  Since she has significant cerebral edema, she will need continued observation.  Allow permissive hypertension for another 24 hours.  Will likely restart Coreg in a.m.  Seen by physical therapy with recommendation for skilled nursing facility placement. 2. Diabetes.  Continue on her home regimen of U-500 insulin.  Monitor blood sugars.  A1c of 8.5. 3. Hypertension.  She is on quinapril and Coreg.  Will restart Coreg tomorrow.   Continue permissive hypertension for now. 4. History of seizures.  Continue on Coreg. 5. Diastolic heart failure.  Appears compensated.  Continue on home dose of torsemide. 6. GERD.  Continue PPI.   DVT prophylaxis: SCDs Code Status: Full code Family Communication: No family present Disposition Plan: Skilled nursing facility on discharge   Consultants:     Procedures:  Echocardiogram: 1. The left ventricle has normal systolic function with an ejection fraction of 60-65%. The cavity size was normal. Left ventricular diastolic Doppler parameters are consistent with impaired relaxation.  2. The right ventricle has normal systolic function. The cavity was normal. There is no increase in right ventricular wall thickness.  3. No evidence present in the left atrial appendage.  4. The mitral valve is degenerative. Mild calcification of the anterior and posterior mitral valve leaflets. There is moderate to severe mitral annular calcification present.  5. The aortic valve is tricuspid. Mild sclerosis of the aortic valve.  6. No pulmonic valve vegetation visualized.    Antimicrobials:       Subjective: Continues to have peripheral visual field deficits  Objective: Vitals:   09/07/18 1600 09/07/18 1644 09/07/18 1700 09/07/18 1800  BP: (!) 151/55  103/83 (!) 142/64  Pulse: 65 71 65 79  Resp: 16 15 20 17   Temp:  97.8 F (36.6 C)    TempSrc:  Oral    SpO2: 100% 100% 100% 97%  Weight:      Height:        Intake/Output Summary (Last 24  hours) at 09/07/2018 1848 Last data filed at 09/07/2018 1156 Gross per 24 hour  Intake 246.4 ml  Output 2500 ml  Net -2253.6 ml   Filed Weights   09/06/18 1152  Weight: 90.7 kg    Examination:  General exam: Appears calm and comfortable  Respiratory system: Clear to auscultation. Respiratory effort normal. Cardiovascular system: S1 & S2 heard, RRR. No JVD, murmurs, rubs, gallops or clicks. No pedal edema. Gastrointestinal system: Abdomen  is nondistended, soft and nontender. No organomegaly or masses felt. Normal bowel sounds heard. Central nervous system: Alert and oriented. No focal neurological deficits. Extremities: Symmetric 5 x 5 power. Skin: No rashes, lesions or ulcers Psychiatry: Judgement and insight appear normal. Mood & affect appropriate.     Data Reviewed: I have personally reviewed following labs and imaging studies  CBC: Recent Labs  Lab 09/06/18 1317  WBC 7.0  NEUTROABS 5.7  HGB 11.1*  HCT 32.6*  MCV 84.5  PLT 465   Basic Metabolic Panel: Recent Labs  Lab 09/06/18 1317  NA 137  K 4.0  CL 105  CO2 19*  GLUCOSE 370*  BUN 28*  CREATININE 1.24*  CALCIUM 8.9   GFR: Estimated Creatinine Clearance: 47.6 mL/min (A) (by C-G formula based on SCr of 1.24 mg/dL (H)). Liver Function Tests: No results for input(s): AST, ALT, ALKPHOS, BILITOT, PROT, ALBUMIN in the last 168 hours. No results for input(s): LIPASE, AMYLASE in the last 168 hours. No results for input(s): AMMONIA in the last 168 hours. Coagulation Profile: No results for input(s): INR, PROTIME in the last 168 hours. Cardiac Enzymes: Recent Labs  Lab 09/06/18 1317  CKTOTAL 72   BNP (last 3 results) No results for input(s): PROBNP in the last 8760 hours. HbA1C: Recent Labs    09/07/18 0422  HGBA1C 8.5*   CBG: Recent Labs  Lab 09/06/18 2119 09/07/18 0802 09/07/18 1122 09/07/18 1431 09/07/18 1641  GLUCAP 96 325* 444* 340* 130*   Lipid Profile: Recent Labs    09/07/18 0423  CHOL 170  HDL 38*  LDLCALC 87  TRIG 225*  CHOLHDL 4.5   Thyroid Function Tests: No results for input(s): TSH, T4TOTAL, FREET4, T3FREE, THYROIDAB in the last 72 hours. Anemia Panel: No results for input(s): VITAMINB12, FOLATE, FERRITIN, TIBC, IRON, RETICCTPCT in the last 72 hours. Sepsis Labs: No results for input(s): PROCALCITON, LATICACIDVEN in the last 168 hours.  No results found for this or any previous visit (from the past 240  hour(s)).       Radiology Studies: Ct Head Wo Contrast  Result Date: 09/06/2018 CLINICAL DATA:  Syncope with recent fall EXAM: CT HEAD WITHOUT CONTRAST TECHNIQUE: Contiguous axial images were obtained from the base of the skull through the vertex without intravenous contrast. COMPARISON:  May 30, 2017 FINDINGS: Brain: There is mild diffuse atrophy. There is decreased attenuation throughout much of the right posteromedial temporal and right medial and posterior occipital lobes. There is relative sparing of the periphery of the right temporal and occipital lobes. There is no demonstrable mass, hemorrhage, extra-axial fluid collection, or midline shift. Brain parenchyma elsewhere appears unremarkable. Vascular: There is no hyperdense vessel appreciable. There is calcification in each carotid siphon region. Skull: Bony calvarium appears intact. Sinuses/Orbits: There is opacification of multiple ethmoid air cells. There is mucosal thickening in the left sphenoid sinus. There is edema in each nasal turbinates. Orbits appear symmetric bilaterally. Other: Mastoid air cells are clear. IMPRESSION: Acute appearing infarct involving a portion of the right posterior cerebral artery  distribution. Specifically, there is cytotoxic edema throughout portions of the medial and posterior right temporal and right occipital lobe with sparing of a portion of the periphery of the right occipital lobe. No other acute appearing infarct evident. No mass or hemorrhage. Foci of arterial vascular calcification noted. Areas of paranasal sinus disease noted. Electronically Signed   By: Lowella Grip III M.D.   On: 09/06/2018 14:09   Mr Jodene Nam Head Wo Contrast  Result Date: 09/06/2018 CLINICAL DATA:  Syncopal episode.  Blurry vision. EXAM: MRI HEAD WITHOUT AND WITH CONTRAST AND MRA HEAD WITHOUT CONTRAST AND MRI NECK WITHOUT AND WITH CONTRAST TECHNIQUE: Multiplanar, multiecho pulse sequences of the brain and surrounding structures  were obtained without and with intravenous contrast. Angiographic images of the head were obtained using MRA technique without and with contrast. Multiplanar, multiecho pulse sequences of the neck and surrounding structures were obtained without and with intravenous contrast. CONTRAST:  Gadavist 9 mL. COMPARISON:  CT head earlier today. FINDINGS: MRI HEAD FINDINGS Brain: Large acute/early subacute RIGHT PCA territory infarct affects the posterior and medial temporal lobe, and occipital lobe. Punctate areas of acute infarction affect the RIGHT thalamus. No brainstem or cerebellar involvement. Mild T1 and T2 shortening, gyriform pattern, affecting the RIGHT occipital lobe, suggests early reperfusion hemorrhage. Only slight postcontrast enhancement. No midline shift. Mild cerebral and cerebellar atrophy. Mild subcortical and periventricular T2 and FLAIR hyperintensities, likely chronic microvascular ischemic change. Vascular: Reported separately Skull and upper cervical spine: Unremarkable Sinuses/Orbits: Mucosal thickening.  BILATERAL cataract extraction Other: None MRA HEAD FINDINGS The internal carotid arteries are widely patent. The basilar artery is widely patent with vertebrals codominant although there may be mild nonstenotic irregularity of the distal RIGHT vertebral SP at the vertebrobasilar junction. No proximal stenosis of the middle cerebral arteries. Non dominant LEFT A1 anterior cerebral artery. The RIGHT P1 PCA is completely occluded at its origin. Faint attempts at collateralization are seen from the RIGHT PCOM. No cerebellar branch occlusion. No saccular aneurysm. MRA NECK FINDINGS Conventional branching of the great vessels from the arch. No proximal stenosis. Carotid bifurcations demonstrate no flow reducing lesion. The common carotid arteries and internal carotid arteries are widely patent without evidence of dissection or vasculitis. No fibromuscular change. No visible ostial stenosis of the  codominant vertebral arteries. Vertebral arteries appear widely patent throughout the neck into the intracranial segments to the basilar confluence. IMPRESSION: Large acute to early subacute RIGHT PCA territory infarct affecting the posteromedial temporal lobe, occipital lobe, and RIGHT thalamus. 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 No extracranial stenosis, dissection, or occlusion. Electronically Signed   By: Staci Righter M.D.   On: 09/06/2018 17:50   Mr Angiogram Neck W Or Wo Contrast  Result Date: 09/06/2018 CLINICAL DATA:  Syncopal episode.  Blurry vision. EXAM: MRI HEAD WITHOUT AND WITH CONTRAST AND MRA HEAD WITHOUT CONTRAST AND MRI NECK WITHOUT AND WITH CONTRAST TECHNIQUE: Multiplanar, multiecho pulse sequences of the brain and surrounding structures were obtained without and with intravenous contrast. Angiographic images of the head were obtained using MRA technique without and with contrast. Multiplanar, multiecho pulse sequences of the neck and surrounding structures were obtained without and with intravenous contrast. CONTRAST:  Gadavist 9 mL. COMPARISON:  CT head earlier today. FINDINGS: MRI HEAD FINDINGS Brain: Large acute/early subacute RIGHT PCA territory infarct affects the posterior and medial temporal lobe, and occipital lobe. Punctate areas of acute infarction affect the RIGHT thalamus. No brainstem or cerebellar involvement. Mild T1 and T2 shortening, gyriform pattern, affecting  the RIGHT occipital lobe, suggests early reperfusion hemorrhage. Only slight postcontrast enhancement. No midline shift. Mild cerebral and cerebellar atrophy. Mild subcortical and periventricular T2 and FLAIR hyperintensities, likely chronic microvascular ischemic change. Vascular: Reported separately Skull and upper cervical spine: Unremarkable Sinuses/Orbits: Mucosal thickening.  BILATERAL cataract extraction Other: None MRA HEAD FINDINGS The internal carotid arteries are widely patent. The  basilar artery is widely patent with vertebrals codominant although there may be mild nonstenotic irregularity of the distal RIGHT vertebral SP at the vertebrobasilar junction. No proximal stenosis of the middle cerebral arteries. Non dominant LEFT A1 anterior cerebral artery. The RIGHT P1 PCA is completely occluded at its origin. Faint attempts at collateralization are seen from the RIGHT PCOM. No cerebellar branch occlusion. No saccular aneurysm. MRA NECK FINDINGS Conventional branching of the great vessels from the arch. No proximal stenosis. Carotid bifurcations demonstrate no flow reducing lesion. The common carotid arteries and internal carotid arteries are widely patent without evidence of dissection or vasculitis. No fibromuscular change. No visible ostial stenosis of the codominant vertebral arteries. Vertebral arteries appear widely patent throughout the neck into the intracranial segments to the basilar confluence. IMPRESSION: Large acute to early subacute RIGHT PCA territory infarct affecting the posteromedial temporal lobe, occipital lobe, and RIGHT thalamus. 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 No extracranial stenosis, dissection, or occlusion. Electronically Signed   By: Staci Righter M.D.   On: 09/06/2018 17:50   Mr Jeri Cos And Wo Contrast  Result Date: 09/06/2018 CLINICAL DATA:  Syncopal episode.  Blurry vision. EXAM: MRI HEAD WITHOUT AND WITH CONTRAST AND MRA HEAD WITHOUT CONTRAST AND MRI NECK WITHOUT AND WITH CONTRAST TECHNIQUE: Multiplanar, multiecho pulse sequences of the brain and surrounding structures were obtained without and with intravenous contrast. Angiographic images of the head were obtained using MRA technique without and with contrast. Multiplanar, multiecho pulse sequences of the neck and surrounding structures were obtained without and with intravenous contrast. CONTRAST:  Gadavist 9 mL. COMPARISON:  CT head earlier today. FINDINGS: MRI HEAD FINDINGS Brain:  Large acute/early subacute RIGHT PCA territory infarct affects the posterior and medial temporal lobe, and occipital lobe. Punctate areas of acute infarction affect the RIGHT thalamus. No brainstem or cerebellar involvement. Mild T1 and T2 shortening, gyriform pattern, affecting the RIGHT occipital lobe, suggests early reperfusion hemorrhage. Only slight postcontrast enhancement. No midline shift. Mild cerebral and cerebellar atrophy. Mild subcortical and periventricular T2 and FLAIR hyperintensities, likely chronic microvascular ischemic change. Vascular: Reported separately Skull and upper cervical spine: Unremarkable Sinuses/Orbits: Mucosal thickening.  BILATERAL cataract extraction Other: None MRA HEAD FINDINGS The internal carotid arteries are widely patent. The basilar artery is widely patent with vertebrals codominant although there may be mild nonstenotic irregularity of the distal RIGHT vertebral SP at the vertebrobasilar junction. No proximal stenosis of the middle cerebral arteries. Non dominant LEFT A1 anterior cerebral artery. The RIGHT P1 PCA is completely occluded at its origin. Faint attempts at collateralization are seen from the RIGHT PCOM. No cerebellar branch occlusion. No saccular aneurysm. MRA NECK FINDINGS Conventional branching of the great vessels from the arch. No proximal stenosis. Carotid bifurcations demonstrate no flow reducing lesion. The common carotid arteries and internal carotid arteries are widely patent without evidence of dissection or vasculitis. No fibromuscular change. No visible ostial stenosis of the codominant vertebral arteries. Vertebral arteries appear widely patent throughout the neck into the intracranial segments to the basilar confluence. IMPRESSION: Large acute to early subacute RIGHT PCA territory infarct affecting the posteromedial temporal lobe, occipital  lobe, and RIGHT thalamus. 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 No extracranial stenosis,  dissection, or occlusion. Electronically Signed   By: Staci Righter M.D.   On: 09/06/2018 17:50        Scheduled Meds:   stroke: mapping our early stages of recovery book   Does not apply Once   atorvastatin  40 mg Oral q1800   clopidogrel  75 mg Oral Daily   [START ON 09/08/2018] insulin aspart  0-20 Units Subcutaneous TID WC   insulin aspart  0-5 Units Subcutaneous QHS   insulin regular human CONCENTRATED  15 Units Subcutaneous BID AC   levETIRAcetam  500 mg Oral BID   levothyroxine  125 mcg Oral Q0600   pantoprazole  40 mg Oral Daily   PARoxetine  40 mg Oral Daily   torsemide  20 mg Oral BID   vitamin B-12  1,000 mcg Oral Daily   vitamin C  500 mg Oral Daily   Continuous Infusions:   LOS: 1 day    Time spent: 73mins    Kathie Dike, MD [facility]   If 7PM-7AM, please contact night-coverage [email or web address]  09/07/2018, 6:48 PM

## 2018-09-07 NOTE — Progress Notes (Signed)
Belongings transferred with patient and received by Jeneen Rinks, RN. Red purse with $70.00 cash, cellphone, and glasses. Pt declined to have locked in safe. Verbalized understanding of risk leaving at bedside.

## 2018-09-07 NOTE — Progress Notes (Signed)
Systolic B/P maintaining greater than 180 (200's). C/O right side HA- rated 2. Resolved with no intervention. No change in Neuro status. At 0035-Dr. Bodenheimer ordered Hydralazine-given  with no change in B/P. At [place] ordered  Lopressor-given with no change in B/P. Dr. Sherral Hammers notified at current time of  B/P status,  Neuro status, and Dr. Nehemiah Settle called at beginning of shift with Neuro changes (Right pupil greater than left pupil, slurred speech, Left facial droop, and Left sided weakness) and Neuro status improved during shift -, slurred speech resolved, Left leg weakness resolved. Pupils and  left side facial droop with no change. Orders pending.

## 2018-09-07 NOTE — Plan of Care (Signed)
  Problem: Acute Rehab PT Goals(only PT should resolve) Goal: Pt Will Go Supine/Side To Sit Outcome: Progressing Flowsheets (Taken 09/07/2018 1015) Pt will go Supine/Side to Sit: with modified independence Goal: Patient Will Transfer Sit To/From Stand Outcome: Progressing Flowsheets (Taken 09/07/2018 1015) Patient will transfer sit to/from stand: with min guard assist Goal: Pt Will Transfer Bed To Chair/Chair To Bed Outcome: Progressing Flowsheets (Taken 09/07/2018 1015) Pt will Transfer Bed to Chair/Chair to Bed: min guard assist Goal: Pt Will Ambulate Outcome: Progressing Flowsheets (Taken 09/07/2018 1015) Pt will Ambulate: 100 feet; with min guard assist; with rolling walker   10:16 AM, 09/07/18 Lonell Grandchild, MPT Physical Therapist with [facility] [phone] office [phone] mobile phone

## 2018-09-07 NOTE — Evaluation (Signed)
Physical Therapy Evaluation Patient Details Name: Jaclyn Herrera MRN: [id number] DOB: Dec 01, 1945 Today's Date: 09/07/2018   History of Present Illness  Jaclyn Herrera is a 73 y.o. female with a history of diabetes, hypertension, CHF, and GERD, seizure disorder.  Patient seen after a fall yesterday.  She was trying to sit on a chair, but caught the arm and it toppled over causing her to fall.  She has an abrasion on her elbow and felt like she lost consciousness due to the pain of her abrasion.  She has had weak and blurred vision for the past 2 weeks after receiving prednisone for gout 2 weeks ago.  Denies chest pain, shortness of breath, fever, nausea, vomiting.  She does complain of some bilateral leg weakness and deficit in the left part of her vision.    Clinical Impression  Patient demonstrates labored movement for sitting up, sit to stands and transfers, at risk for falls due to veering to the right during ambulation possibly due to reduced visual field on the left requiring verbal/tactile cueing to prevent loss of balance.  Patient able to track and see objects with left eye when right eye covered.  Patient tolerated sitting up in chair after therapy - RN notified.  Patient will benefit from continued physical therapy in hospital and recommended venue below to increase strength, balance, endurance for safe ADLs and gait.    Follow Up Recommendations SNF;Supervision for mobility/OOB;Supervision - Intermittent    Equipment Recommendations       Recommendations for Other Services       Precautions / Restrictions Precautions Precautions: Fall Restrictions Weight Bearing Restrictions: No      Mobility  Bed Mobility Overal bed mobility: Needs Assistance Bed Mobility: Supine to Sit   Sidelying to sit: Supervision       General bed mobility comments: increased time  Transfers Overall transfer level: Needs assistance Equipment used: Rolling walker (2 wheeled);None Transfers:  Sit to/from [employer] to Stand: Min guard Stand pivot transfers: Min guard       General transfer comment: unsteady on feet with tendency to lean on nearby objects for support when not using AD, safer using RW  Ambulation/Gait Ambulation/Gait assistance: Min assist Gait Distance (Feet): 75 Feet Assistive device: Rolling walker (2 wheeled) Gait Pattern/deviations: Decreased step length - right;Decreased step length - left;Decreased stride length Gait velocity: decreased   General Gait Details: slow slightly unsteady cadence with frequent veering to the right requiring verbal and occasional tactile cueing to prevent bumping into walls and objects on right side, limited secondary to fatigue  Stairs            Wheelchair Mobility    Modified Rankin (Stroke Patients Only)       Balance Overall balance assessment: Needs assistance Sitting-balance support: Feet supported;No upper extremity supported Sitting balance-Leahy Scale: Good     Standing balance support: No upper extremity supported;During functional activity Standing balance-Leahy Scale: Fair Standing balance comment: fair/poor without AD, fair using RW                             Pertinent Vitals/Pain Pain Assessment: No/denies pain    Home Living Family/patient expects to be discharged to:: Private residence Living Arrangements: Alone Available Help at Discharge: Family;Available 24 hours/day Type of Home: House Home Access: Stairs to enter Entrance Stairs-Rails: None Entrance Stairs-Number of Steps: 1 Home Layout: One level Home Equipment: Walker - 4 wheels;Walker -  2 wheels;Cane - single point;Shower seat;Bedside commode      Prior Function Level of Independence: Independent with assistive device(s)         Comments: household and short distanced community ambulator with RW     Hand Dominance   Dominant Hand: Right    Extremity/Trunk Assessment   Upper  Extremity Assessment Upper Extremity Assessment: Defer to OT evaluation    Lower Extremity Assessment Lower Extremity Assessment: Generalized weakness    Cervical / Trunk Assessment Cervical / Trunk Assessment: Normal  Communication   Communication: No difficulties  Cognition Arousal/Alertness: Awake/alert Behavior During Therapy: WFL for tasks assessed/performed Overall Cognitive Status: Within Functional Limits for tasks assessed                                        General Comments      Exercises     Assessment/Plan    PT Assessment Patient needs continued PT services  PT Problem List Decreased strength;Decreased activity tolerance;Decreased balance;Decreased mobility       PT Treatment Interventions Therapeutic exercise;Gait training;Stair training;Functional mobility training;Therapeutic activities;Patient/family education    PT Goals (Current goals can be found in the Care Plan section)  Acute Rehab PT Goals Patient Stated Goal: return home after rehab PT Goal Formulation: With patient Time For Goal Achievement: 09/21/18 Potential to Achieve Goals: Good    Frequency 7X/week   Barriers to discharge        Co-evaluation               AM-PAC PT "6 Clicks" Mobility  Outcome Measure Help needed turning from your back to your side while in a flat bed without using bedrails?: None Help needed moving from lying on your back to sitting on the side of a flat bed without using bedrails?: None Help needed moving to and from a bed to a chair (including a wheelchair)?: A Little Help needed standing up from a chair using your arms (e.g., wheelchair or bedside chair)?: A Little Help needed to walk in hospital room?: A Little Help needed climbing 3-5 steps with a railing? : A Lot 6 Click Score: 19    End of Session Equipment Utilized During Treatment: Gait belt Activity Tolerance: Patient tolerated treatment well;Patient limited by  fatigue Patient left: in chair;with call bell/phone within reach Nurse Communication: Mobility status PT Visit Diagnosis: Unsteadiness on feet (R26.81);Other abnormalities of gait and mobility (R26.89);Muscle weakness (generalized) (M62.81)    Time: 7322-0254 PT Time Calculation (min) (ACUTE ONLY): 30 min   Charges:   PT Evaluation $PT Eval Moderate Complexity: 1 Mod PT Treatments $Therapeutic Activity: 23-37 mins        10:13 AM, 09/07/18 Lonell Grandchild, MPT Physical Therapist with [facility] [phone] office [phone] mobile phone

## 2018-09-07 NOTE — Progress Notes (Signed)
Patient transferred to ICU in stable condition with glasses, cell phone with charge, and red purse.

## 2018-09-07 NOTE — Plan of Care (Signed)
Education provided to patient along with stroke booklet. Patient education/care plan is progressing towards goals.

## 2018-09-07 NOTE — Progress Notes (Signed)
Handoff report given to Jeneen Rinks, Therapist, sports. Transferred to ICU room 9. Stable. No change in previous assessment.

## 2018-09-08 ENCOUNTER — Inpatient Hospital Stay (HOSPITAL_COMMUNITY): Payer: Medicare Other

## 2018-09-08 LAB — BASIC METABOLIC PANEL
Anion gap: 11 (ref 5–15)
BUN: 22 mg/dL (ref 8–23)
CALCIUM: 8 mg/dL — AB (ref 8.9–10.3)
CHLORIDE: 103 mmol/L (ref 98–111)
CO2: 22 mmol/L (ref 22–32)
Creatinine, Ser: 1.13 mg/dL — ABNORMAL HIGH (ref 0.44–1.00)
GFR calc Af Amer: 56 mL/min — ABNORMAL LOW (ref >60)
GFR calc non Af Amer: 48 mL/min — ABNORMAL LOW (ref >60)
Glucose, Bld: 343 mg/dL — ABNORMAL HIGH (ref 70–99)
Potassium: 3.7 mmol/L (ref 3.5–5.1)
Sodium: 136 mmol/L (ref 135–145)

## 2018-09-08 LAB — CBC
HCT: 30.8 % — ABNORMAL LOW (ref 36.0–46.0)
Hemoglobin: 10.4 g/dL — ABNORMAL LOW (ref 12.0–15.0)
MCH: 28.3 pg (ref 26.0–34.0)
MCHC: 33.8 g/dL (ref 30.0–36.0)
MCV: 83.7 fL (ref 80.0–100.0)
Platelets: 161 10*3/uL (ref 150–400)
RBC: 3.68 MIL/uL — ABNORMAL LOW (ref 3.87–5.11)
RDW: 14 % (ref 11.5–15.5)
WBC: 5.5 10*3/uL (ref 4.0–10.5)
nRBC: 0 % (ref 0.0–0.2)

## 2018-09-08 LAB — GLUCOSE, CAPILLARY
Glucose-Capillary: 319 mg/dL — ABNORMAL HIGH (ref 70–99)
Glucose-Capillary: 322 mg/dL — ABNORMAL HIGH (ref 70–99)
Glucose-Capillary: 139 mg/dL — ABNORMAL HIGH (ref 70–99)
Glucose-Capillary: 200 mg/dL — ABNORMAL HIGH (ref 70–99)

## 2018-09-08 MED ORDER — INSULIN REGULAR HUMAN (CONC) 500 UNIT/ML ~~LOC~~ SOPN
20.0000 [IU] | PEN_INJECTOR | Freq: Two times a day (BID) | SUBCUTANEOUS | Status: DC
  Administered 2018-09-08 – 2018-09-09 (×2): 20 [IU] via SUBCUTANEOUS
  Filled 2018-09-08: qty 3

## 2018-09-08 MED ORDER — POLYVINYL ALCOHOL 1.4 % OP SOLN
1.0000 [drp] | OPHTHALMIC | Status: DC | PRN
  Administered 2018-09-08: 1 [drp] via OPHTHALMIC
  Filled 2018-09-08: qty 15

## 2018-09-08 MED ORDER — CHLORHEXIDINE GLUCONATE CLOTH 2 % EX PADS
6.0000 | MEDICATED_PAD | Freq: Every day | CUTANEOUS | Status: DC
  Administered 2018-09-08 – 2018-09-10 (×3): 6 via TOPICAL

## 2018-09-08 MED ORDER — SODIUM CHLORIDE 0.9% FLUSH
3.0000 mL | Freq: Two times a day (BID) | INTRAVENOUS | Status: DC
  Administered 2018-09-08 – 2018-09-10 (×4): 3 mL via INTRAVENOUS

## 2018-09-08 NOTE — Progress Notes (Signed)
PROGRESS NOTE    Jaclyn Herrera  QTM:[id number] DOB: September 23, 1945 DOA: 09/06/2018 PCP: Asencion Noble, MD    Brief Narrative:  73 year old female admitted to the hospital after a fall and resultant visual field deficit.  She reports having blurred vision for approximately 2 weeks prior to admission and had been feeling weak.  She attributed this to her recent course of prednisone that she had taken for gout.  She also complains of some bilateral leg weakness.  On arrival to the emergency room, CT confirmed establish right PCA infarct with surrounding cerebral edema.  She was admitted to the hospital for further work-up.   Assessment & Plan:   Principal Problem:   Acute ischemic stroke [facility]) Active Problems:   Type 2 diabetes mellitus with other specified complication (HCC)   GERD   Essential hypertension   HLD (hyperlipidemia)   GERD (gastroesophageal reflux disease)   Chronic diastolic heart failure (HCC)   Seizure disorder, focal motor ([place])   1. Acute CVA.  Patient admitted with lower artery infarct.  She is not a candidate for TPA due to delay in arrival.  She was noted to have established infarct with cytotoxic edema on initial CT imaging.  Patient is not on any chronic antiplatelet agent.  She describes an allergy to aspirin.  She was initially started on Plavix and received a dose on 3/28 and 3/29.  Case was discussed with neurology and concern was expressed regarding embolic features of stroke.  Echocardiogram was unrevealing.  She will need a 30-day event monitor.  She is currently on high-dose statin.  Since she has significant cerebral edema, she will need continued observation.  Allow permissive hypertension for another 24 hours.  Seen by physical therapy with recommendation for skilled nursing facility placement.  On 3/29, patient did complain of headache and it was noted that she had worsening left-sided weakness.  Repeat CT head was done that showed progression of cerebral edema.   It was noted there was slight hemorrhagic conversion.  This was reviewed with radiologist and it was noted that slight hemorrhagic conversion was present on admission CT and did not appear to be significantly different.  Since there is slight hemorrhagic conversion, will hold further Plavix for now.  Plan on in-house neurology consultation tomorrow.  Continue to monitor neuro status. 2. Diabetes.  Continue on her home regimen of U-500 insulin.  Blood sugars currently elevated.  Will increase home regimen to 20 units twice daily.  Continue sliding scale.  A1c of 8.5. 3. Hypertension.  She is on quinapril and Coreg.  Blood pressures are currently stable.  Continue permissive hypertension for now. 4. History of seizures.  Continue on Keppra. 5. Diastolic heart failure.  Appears compensated.  Continue on home dose of torsemide. 6. GERD.  Continue PPI.   DVT prophylaxis: SCDs Code Status: Full code Family Communication: At patient's request, her care was discussed with her cousin, Suanne Marker [phone] Disposition Plan: Skilled nursing facility on discharge   Consultants:     Procedures:  Echocardiogram: 1. The left ventricle has normal systolic function with an ejection fraction of 60-65%. The cavity size was normal. Left ventricular diastolic Doppler parameters are consistent with impaired relaxation.  2. The right ventricle has normal systolic function. The cavity was normal. There is no increase in right ventricular wall thickness.  3. No evidence present in the left atrial appendage.  4. The mitral valve is degenerative. Mild calcification of the anterior and posterior mitral valve leaflets. There is moderate  to severe mitral annular calcification present.  5. The aortic valve is tricuspid. Mild sclerosis of the aortic valve.  6. No pulmonic valve vegetation visualized.    Antimicrobials:       Subjective: It was noted by physical therapy today that she had worsening left-sided  weakness.  She does complain of headache and some numbness around her mouth.  Continues to have visual field deficits.  Objective: Vitals:   09/08/18 1323 09/08/18 1400 09/08/18 1450 09/08/18 1500  BP:  (!) 134/58  (!) 133/49  Pulse: 92 (!) 50    Resp: (!) 22 12  (!) 21  Temp:   98.4 F (36.9 C)   TempSrc:   Oral   SpO2:      Weight:      Height:        Intake/Output Summary (Last 24 hours) at 09/08/2018 1639 Last data filed at 09/08/2018 0830 Gross per 24 hour  Intake --  Output 850 ml  Net -850 ml   Filed Weights   09/06/18 1152 09/08/18 0650  Weight: 90.7 kg 90.5 kg    Examination:  General exam: Appears calm and comfortable  Respiratory system: Clear to auscultation. Respiratory effort normal. Cardiovascular system: S1 & S2 heard, RRR. No JVD, murmurs, rubs, gallops or clicks.  Gastrointestinal system: Abdomen is nondistended, soft and nontender. No organomegaly or masses felt. Normal bowel sounds heard. Central nervous system: diminished peripheral fields bilaterally, left upper and lower extremity weakness Extremities: No edema bilaterally Skin: No rashes, lesions or ulcers Psychiatry: She is anxious and tearful    Data Reviewed: I have personally reviewed following labs and imaging studies  CBC: Recent Labs  Lab 09/06/18 1317 09/08/18 0531  WBC 7.0 5.5  NEUTROABS 5.7  --   HGB 11.1* 10.4*  HCT 32.6* 30.8*  MCV 84.5 83.7  PLT 155 568   Basic Metabolic Panel: Recent Labs  Lab 09/06/18 1317 09/08/18 0531  NA 137 136  K 4.0 3.7  CL 105 103  CO2 19* 22  GLUCOSE 370* 343*  BUN 28* 22  CREATININE 1.24* 1.13*  CALCIUM 8.9 8.0*   GFR: Estimated Creatinine Clearance: 52.1 mL/min (A) (by C-G formula based on SCr of 1.13 mg/dL (H)). Liver Function Tests: No results for input(s): AST, ALT, ALKPHOS, BILITOT, PROT, ALBUMIN in the last 168 hours. No results for input(s): LIPASE, AMYLASE in the last 168 hours. No results for input(s): AMMONIA in the last  168 hours. Coagulation Profile: No results for input(s): INR, PROTIME in the last 168 hours. Cardiac Enzymes: Recent Labs  Lab 09/06/18 1317  CKTOTAL 72   BNP (last 3 results) No results for input(s): PROBNP in the last 8760 hours. HbA1C: Recent Labs    09/07/18 0422  HGBA1C 8.5*   CBG: Recent Labs  Lab 09/07/18 1431 09/07/18 1641 09/07/18 2141 09/08/18 0730 09/08/18 1108  GLUCAP 340* 130* 277* 319* 322*   Lipid Profile: Recent Labs    09/07/18 0423  CHOL 170  HDL 38*  LDLCALC 87  TRIG 225*  CHOLHDL 4.5   Thyroid Function Tests: No results for input(s): TSH, T4TOTAL, FREET4, T3FREE, THYROIDAB in the last 72 hours. Anemia Panel: No results for input(s): VITAMINB12, FOLATE, FERRITIN, TIBC, IRON, RETICCTPCT in the last 72 hours. Sepsis Labs: No results for input(s): PROCALCITON, LATICACIDVEN in the last 168 hours.  Recent Results (from the past 240 hour(s))  MRSA PCR Screening     Status: None   Collection Time: 09/07/18  5:08 AM  Result  Value Ref Range Status   MRSA by PCR NEGATIVE NEGATIVE Final    Comment:        The GeneXpert MRSA Assay (FDA approved for NASAL specimens only), is one component of a comprehensive MRSA colonization surveillance program. It is not intended to diagnose MRSA infection nor to guide or monitor treatment for MRSA infections. Performed at [facility], [address] [place], [place] [address]          Radiology Studies: Ct Head Wo Contrast  Result Date: 09/08/2018 CLINICAL DATA:  Worsening symptoms of acute stroke after a fall. Incoordination of LEFT upper and lower extremity. EXAM: CT HEAD WITHOUT CONTRAST TECHNIQUE: Contiguous axial images were obtained from the base of the skull through the vertex without intravenous contrast. COMPARISON:  CT head 09/06/2018. MR head 09/06/2018 FINDINGS: Brain: Increased cytotoxic edema related to the large RIGHT PCA territory infarction due to proximal PCA occlusion, with mild  swelling of the RIGHT occipital lobe and posterior temporal lobe. The infarcted brain appears lower in attenuation and increasingly well demarcated from surrounding normal structures. Slight hemorrhagic transformation, most prominent at the RIGHT occipital pole, is better seen, but not appreciably worse. Infarction involving the RIGHT medial temporal lobe does not appear to result in sufficient swelling to compress the brainstem at the midbrain level, nor are there new areas of infarction involving the RIGHT MCA territory. Vascular: No hyperdense vessel or unexpected calcification. Skull: Normal. Negative for fracture or focal lesion. Sinuses/Orbits: No acute finding. Other: None. IMPRESSION: Increasing cytotoxic edema related to the large RIGHT PCA territory infarction. Slight hemorrhagic transformation, most prominent at the RIGHT occipital pole, but not appreciably worse. No new areas of infarction are identified. Electronically Signed   By: Staci Righter M.D.   On: 09/08/2018 12:52   Mr Jodene Nam Head Wo Contrast  Result Date: 09/06/2018 CLINICAL DATA:  Syncopal episode.  Blurry vision. EXAM: MRI HEAD WITHOUT AND WITH CONTRAST AND MRA HEAD WITHOUT CONTRAST AND MRI NECK WITHOUT AND WITH CONTRAST TECHNIQUE: Multiplanar, multiecho pulse sequences of the brain and surrounding structures were obtained without and with intravenous contrast. Angiographic images of the head were obtained using MRA technique without and with contrast. Multiplanar, multiecho pulse sequences of the neck and surrounding structures were obtained without and with intravenous contrast. CONTRAST:  Gadavist 9 mL. COMPARISON:  CT head earlier today. FINDINGS: MRI HEAD FINDINGS Brain: Large acute/early subacute RIGHT PCA territory infarct affects the posterior and medial temporal lobe, and occipital lobe. Punctate areas of acute infarction affect the RIGHT thalamus. No brainstem or cerebellar involvement. Mild T1 and T2 shortening, gyriform pattern,  affecting the RIGHT occipital lobe, suggests early reperfusion hemorrhage. Only slight postcontrast enhancement. No midline shift. Mild cerebral and cerebellar atrophy. Mild subcortical and periventricular T2 and FLAIR hyperintensities, likely chronic microvascular ischemic change. Vascular: Reported separately Skull and upper cervical spine: Unremarkable Sinuses/Orbits: Mucosal thickening.  BILATERAL cataract extraction Other: None MRA HEAD FINDINGS The internal carotid arteries are widely patent. The basilar artery is widely patent with vertebrals codominant although there may be mild nonstenotic irregularity of the distal RIGHT vertebral SP at the vertebrobasilar junction. No proximal stenosis of the middle cerebral arteries. Non dominant LEFT A1 anterior cerebral artery. The RIGHT P1 PCA is completely occluded at its origin. Faint attempts at collateralization are seen from the RIGHT PCOM. No cerebellar branch occlusion. No saccular aneurysm. MRA NECK FINDINGS Conventional branching of the great vessels from the arch. No proximal stenosis. Carotid bifurcations demonstrate no flow reducing lesion. The  common carotid arteries and internal carotid arteries are widely patent without evidence of dissection or vasculitis. No fibromuscular change. No visible ostial stenosis of the codominant vertebral arteries. Vertebral arteries appear widely patent throughout the neck into the intracranial segments to the basilar confluence. IMPRESSION: Large acute to early subacute RIGHT PCA territory infarct affecting the posteromedial temporal lobe, occipital lobe, and RIGHT thalamus. 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 No extracranial stenosis, dissection, or occlusion. Electronically Signed   By: Staci Righter M.D.   On: 09/06/2018 17:50   Mr Angiogram Neck W Or Wo Contrast  Result Date: 09/06/2018 CLINICAL DATA:  Syncopal episode.  Blurry vision. EXAM: MRI HEAD WITHOUT AND WITH CONTRAST AND MRA HEAD  WITHOUT CONTRAST AND MRI NECK WITHOUT AND WITH CONTRAST TECHNIQUE: Multiplanar, multiecho pulse sequences of the brain and surrounding structures were obtained without and with intravenous contrast. Angiographic images of the head were obtained using MRA technique without and with contrast. Multiplanar, multiecho pulse sequences of the neck and surrounding structures were obtained without and with intravenous contrast. CONTRAST:  Gadavist 9 mL. COMPARISON:  CT head earlier today. FINDINGS: MRI HEAD FINDINGS Brain: Large acute/early subacute RIGHT PCA territory infarct affects the posterior and medial temporal lobe, and occipital lobe. Punctate areas of acute infarction affect the RIGHT thalamus. No brainstem or cerebellar involvement. Mild T1 and T2 shortening, gyriform pattern, affecting the RIGHT occipital lobe, suggests early reperfusion hemorrhage. Only slight postcontrast enhancement. No midline shift. Mild cerebral and cerebellar atrophy. Mild subcortical and periventricular T2 and FLAIR hyperintensities, likely chronic microvascular ischemic change. Vascular: Reported separately Skull and upper cervical spine: Unremarkable Sinuses/Orbits: Mucosal thickening.  BILATERAL cataract extraction Other: None MRA HEAD FINDINGS The internal carotid arteries are widely patent. The basilar artery is widely patent with vertebrals codominant although there may be mild nonstenotic irregularity of the distal RIGHT vertebral SP at the vertebrobasilar junction. No proximal stenosis of the middle cerebral arteries. Non dominant LEFT A1 anterior cerebral artery. The RIGHT P1 PCA is completely occluded at its origin. Faint attempts at collateralization are seen from the RIGHT PCOM. No cerebellar branch occlusion. No saccular aneurysm. MRA NECK FINDINGS Conventional branching of the great vessels from the arch. No proximal stenosis. Carotid bifurcations demonstrate no flow reducing lesion. The common carotid arteries and internal  carotid arteries are widely patent without evidence of dissection or vasculitis. No fibromuscular change. No visible ostial stenosis of the codominant vertebral arteries. Vertebral arteries appear widely patent throughout the neck into the intracranial segments to the basilar confluence. IMPRESSION: Large acute to early subacute RIGHT PCA territory infarct affecting the posteromedial temporal lobe, occipital lobe, and RIGHT thalamus. 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 No extracranial stenosis, dissection, or occlusion. Electronically Signed   By: Staci Righter M.D.   On: 09/06/2018 17:50   Mr Jeri Cos And Wo Contrast  Result Date: 09/06/2018 CLINICAL DATA:  Syncopal episode.  Blurry vision. EXAM: MRI HEAD WITHOUT AND WITH CONTRAST AND MRA HEAD WITHOUT CONTRAST AND MRI NECK WITHOUT AND WITH CONTRAST TECHNIQUE: Multiplanar, multiecho pulse sequences of the brain and surrounding structures were obtained without and with intravenous contrast. Angiographic images of the head were obtained using MRA technique without and with contrast. Multiplanar, multiecho pulse sequences of the neck and surrounding structures were obtained without and with intravenous contrast. CONTRAST:  Gadavist 9 mL. COMPARISON:  CT head earlier today. FINDINGS: MRI HEAD FINDINGS Brain: Large acute/early subacute RIGHT PCA territory infarct affects the posterior and medial temporal lobe, and occipital  lobe. Punctate areas of acute infarction affect the RIGHT thalamus. No brainstem or cerebellar involvement. Mild T1 and T2 shortening, gyriform pattern, affecting the RIGHT occipital lobe, suggests early reperfusion hemorrhage. Only slight postcontrast enhancement. No midline shift. Mild cerebral and cerebellar atrophy. Mild subcortical and periventricular T2 and FLAIR hyperintensities, likely chronic microvascular ischemic change. Vascular: Reported separately Skull and upper cervical spine: Unremarkable Sinuses/Orbits: Mucosal  thickening.  BILATERAL cataract extraction Other: None MRA HEAD FINDINGS The internal carotid arteries are widely patent. The basilar artery is widely patent with vertebrals codominant although there may be mild nonstenotic irregularity of the distal RIGHT vertebral SP at the vertebrobasilar junction. No proximal stenosis of the middle cerebral arteries. Non dominant LEFT A1 anterior cerebral artery. The RIGHT P1 PCA is completely occluded at its origin. Faint attempts at collateralization are seen from the RIGHT PCOM. No cerebellar branch occlusion. No saccular aneurysm. MRA NECK FINDINGS Conventional branching of the great vessels from the arch. No proximal stenosis. Carotid bifurcations demonstrate no flow reducing lesion. The common carotid arteries and internal carotid arteries are widely patent without evidence of dissection or vasculitis. No fibromuscular change. No visible ostial stenosis of the codominant vertebral arteries. Vertebral arteries appear widely patent throughout the neck into the intracranial segments to the basilar confluence. IMPRESSION: Large acute to early subacute RIGHT PCA territory infarct affecting the posteromedial temporal lobe, occipital lobe, and RIGHT thalamus. 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 No extracranial stenosis, dissection, or occlusion. Electronically Signed   By: Staci Righter M.D.   On: 09/06/2018 17:50        Scheduled Meds:  atorvastatin  40 mg Oral q1800   Chlorhexidine Gluconate Cloth  6 each Topical Q0600   insulin aspart  0-20 Units Subcutaneous TID WC   insulin aspart  0-5 Units Subcutaneous QHS   insulin regular human CONCENTRATED  20 Units Subcutaneous BID AC   levETIRAcetam  500 mg Oral BID   levothyroxine  125 mcg Oral Q0600   pantoprazole  40 mg Oral Daily   PARoxetine  40 mg Oral Daily   torsemide  20 mg Oral BID   vitamin B-12  1,000 mcg Oral Daily   vitamin C  500 mg Oral Daily   Continuous  Infusions:   LOS: 2 days    Time spent: 31mins    Kathie Dike, MD [facility]   If 7PM-7AM, please contact night-coverage [email or web address]  09/08/2018, 4:39 PM

## 2018-09-08 NOTE — Progress Notes (Signed)
Pt is very tearful this morning. She tells me this morning that she is the last of 6 children living. She states she does have other family members but that they do not concern themselves with her. Pt expresses concern of her blurred vision and fear of going blind and not being able to care for herself or having anyone else care for her. Attempted to reassure and comfort pt and shared some scripture. Pt recounts memories of her siblings and parents. Pt would benefit from a spiritual care consult.

## 2018-09-08 NOTE — Progress Notes (Signed)
Physical Therapy Treatment Patient Details Name: Jaclyn Herrera MRN: [id number] DOB: Apr 03, 1946 Today's Date: 09/08/2018    History of Present Illness Jaclyn Herrera is a 73 y.o. female with a history of diabetes, hypertension, CHF, and GERD, seizure disorder.  Patient seen after a fall yesterday.  She was trying to sit on a chair, but caught the arm and it toppled over causing her to fall.  She has an abrasion on her elbow and felt like she lost consciousness due to the pain of her abrasion.  She has had weak and blurred vision for the past 2 weeks after receiving prednisone for gout 2 weeks ago.  Denies chest pain, shortness of breath, fever, nausea, vomiting.  She does complain of some bilateral leg weakness and deficit in the left part of her vision.    PT Comments    Patient presents very depressed/labile with c/o numbness around lips and LUE, no significant change in left sided strength noted, but now has severe incoordination of LUE/LE, unable to grip RW with left handm, has difficulty placing left hand on armrest of chair or reaching for objects, and unable to take steps away from bedside due to LLE giving way with poor coordination.  Patient transferred to chair, but unable to tolerate sitting up due to c/o nausea and dizziness and put back to bed - RN notified.  Patient will benefit from continued physical therapy in hospital and recommended venue below to increase strength, balance, endurance for safe ADLs and gait.   Follow Up Recommendations  SNF     Equipment Recommendations  None recommended by PT    Recommendations for Other Services       Precautions / Restrictions Precautions Precautions: Fall Restrictions Weight Bearing Restrictions: No    Mobility  Bed Mobility Overal bed mobility: Needs Assistance Bed Mobility: Supine to Sit;Sit to Supine     Supine to sit: Min assist Sit to supine: Min assist;Mod assist   General bed mobility comments: slow labored movement  with diffiuclty using LUE due to numbness, incoordination  Transfers Overall transfer level: Needs assistance Equipment used: Rolling walker (2 wheeled) Transfers: Sit to/from [facility] Sit to Stand: Min assist;Mod assist Stand pivot transfers: Mod assist;Max assist       General transfer comment: very unsteady on feet with LLE giving way  Ambulation/Gait Ambulation/Gait assistance: Max assist Gait Distance (Feet): 3 Feet Assistive device: 1 person hand held assist;Rolling walker (2 wheeled) Gait Pattern/deviations: Decreased step length - right;Decreased step length - left;Decreased stance time - left;Decreased stride length Gait velocity: slow   General Gait Details: limited to 3-4 slow unsteady steps at bedside with LLE buckling, unable to hold onto RW with left hand primarily due to decreased proprioception   Stairs             Wheelchair Mobility    Modified Rankin (Stroke Patients Only)       Balance Overall balance assessment: Needs assistance Sitting-balance support: Feet supported;No upper extremity supported Sitting balance-Leahy Scale: Poor Sitting balance - Comments: fair/poor with frequent leaning forward Postural control: Other (comment)(leans forward) Standing balance support: No upper extremity supported;During functional activity Standing balance-Leahy Scale: Poor Standing balance comment: poor using RW due unable to grip with left hand                            Cognition Arousal/Alertness: Awake/alert Behavior During Therapy: WFL for tasks assessed/performed Overall Cognitive Status: Within  Functional Limits for tasks assessed                                        Exercises      General Comments        Pertinent Vitals/Pain Pain Assessment: Faces Faces Pain Scale: Hurts a little bit Pain Location: right knee, head ache Pain Descriptors / Indicators: Sore;Aching Pain Intervention(s):  Limited activity within patient's tolerance;Monitored during session;Premedicated before session    Home Living                      Prior Function            PT Goals (current goals can now be found in the care plan section) Acute Rehab PT Goals Patient Stated Goal: return home after rehab PT Goal Formulation: With patient Time For Goal Achievement: 09/21/18 Potential to Achieve Goals: Fair Progress towards PT goals: Not progressing toward goals - comment(appears left sided deficits are worse)    Frequency    7X/week      PT Plan Current plan remains appropriate    Co-evaluation              AM-PAC PT "6 Clicks" Mobility   Outcome Measure  Help needed turning from your back to your side while in a flat bed without using bedrails?: A Little Help needed moving from lying on your back to sitting on the side of a flat bed without using bedrails?: A Lot Help needed moving to and from a bed to a chair (including a wheelchair)?: A Lot Help needed standing up from a chair using your arms (e.g., wheelchair or bedside chair)?: A Lot Help needed to walk in hospital room?: A Lot Help needed climbing 3-5 steps with a railing? : Total 6 Click Score: 12    End of Session Equipment Utilized During Treatment: Gait belt Activity Tolerance: Patient limited by fatigue;Other (comment)(Patient limited due to c/o dizziness, nausea) Patient left: in bed;with call bell/phone within reach Nurse Communication: Mobility status PT Visit Diagnosis: Unsteadiness on feet (R26.81);Other abnormalities of gait and mobility (R26.89);Muscle weakness (generalized) (M62.81)     Time: 7353-2992 PT Time Calculation (min) (ACUTE ONLY): 29 min  Charges:  $Therapeutic Exercise: 8-22 mins $Therapeutic Activity: 8-22 mins                     11:53 AM, 09/08/18 Lonell Grandchild, MPT Physical Therapist with [facility] [phone] office [phone] mobile phone

## 2018-09-09 ENCOUNTER — Inpatient Hospital Stay (HOSPITAL_COMMUNITY): Payer: Medicare Other

## 2018-09-09 LAB — GLUCOSE, CAPILLARY
Glucose-Capillary: 268 mg/dL — ABNORMAL HIGH (ref 70–99)
Glucose-Capillary: 350 mg/dL — ABNORMAL HIGH (ref 70–99)
Glucose-Capillary: 335 mg/dL — ABNORMAL HIGH (ref 70–99)
Glucose-Capillary: 91 mg/dL (ref 70–99)

## 2018-09-09 MED ORDER — INSULIN REGULAR HUMAN (CONC) 500 UNIT/ML ~~LOC~~ SOPN
25.0000 [IU] | PEN_INJECTOR | Freq: Two times a day (BID) | SUBCUTANEOUS | Status: DC
  Administered 2018-09-09 – 2018-09-10 (×2): 25 [IU] via SUBCUTANEOUS
  Filled 2018-09-09: qty 3

## 2018-09-09 NOTE — Plan of Care (Signed)
  Problem: Acute Rehab OT Goals (only OT should resolve) Goal: Pt. Will Perform Eating Flowsheets (Taken 09/09/2018 1609) Pt Will Perform Eating: with set-up; sitting Goal: Pt. Will Perform Grooming Flowsheets (Taken 09/09/2018 1609) Pt Will Perform Grooming: with supervision; sitting Goal: Pt. Will Perform Upper Body Bathing Flowsheets (Taken 09/09/2018 1609) Pt Will Perform Upper Body Bathing: with supervision; sitting Goal: Pt. Will Perform Upper Body Dressing Flowsheets (Taken 09/09/2018 1609) Pt Will Perform Upper Body Dressing: with min assist; sitting Goal: Pt. Will Transfer To Toilet Flowsheets (Taken 09/09/2018 1609) Pt Will Transfer to Toilet: with mod assist; stand pivot transfer; bedside commode Goal: Pt. Will Perform Toileting-Clothing Manipulation Flowsheets (Taken 09/09/2018 1609) Pt Will Perform Toileting - Clothing Manipulation and hygiene: with mod assist; sitting/lateral leans Goal: Pt/Caregiver Will Perform Home Exercise Program Flowsheets (Taken 09/09/2018 1609) Pt/caregiver will Perform Home Exercise Program: Increased strength; Left upper extremity; With theraputty; With minimal assist; With written HEP provided (increase fine and gross motor coordination) Goal: OT Additional ADL Goal #1 Flowsheets (Taken 09/09/2018 1609) Additional ADL Goal #1: Patient will be educated and demonstrate/verbalize understanding of safety precautions and compensatory techniques to complete related to her left Homonymous hemianopia that will increase her leve of safety and allow her to participate in ADL tasks with less assistance.

## 2018-09-09 NOTE — TOC Initial Note (Signed)
Transition of Care [facility]) - Initial/Assessment Note    Patient Details  Name: Jaclyn Herrera MRN: [id number] Date of Birth: [birth year]  Transition of Care [facility]) CM/SW Contact:    Shade Flood, LCSW Phone Number: 09/09/2018, 12:27 PM  Clinical Narrative:                 PT recommending SNF rehab for pt. Met with pt today to assist. Pt reports that she lives alone and had been independent in ADLs prior to admission. Pt feels that the Prednisone she was prescribed for her gout caused her stroke and vision changes. Pt is tearful and also angry about being prescribed the Prednisone. Much emotional support offered to pt.   Pt is agreeable to SNF rehab. CMS list reviewed with pt verbally as pt stated she cannot see to read it now. Will refer to requested facilities.  Expected Discharge Plan: Skilled Nursing Facility Barriers to Discharge: No Barriers Identified   Patient Goals and CMS Choice Patient states their goals for this hospitalization and ongoing recovery are:: rehab and return home CMS Medicare.gov Compare Post Acute Care list provided to:: Patient Choice offered to / list presented to : Patient  Expected Discharge Plan and Services Expected Discharge Plan: [place]       Living arrangements for the past 2 months: Single Family Home Expected Discharge Date: 09/10/18                        Prior Living Arrangements/Services Living arrangements for the past 2 months: Single Family Home Lives with:: Self Patient language and need for interpreter reviewed:: Yes Do you feel safe going back to the place where you live?: Yes      Need for Family Participation in Patient Care: No (Comment) Care giver support system in place?: No (comment)   Criminal Activity/Legal Involvement Pertinent to Current Situation/Hospitalization: No - Comment as needed  Activities of Daily Living Home Assistive Devices/Equipment: None ADL Screening (condition at time of  admission) Patient's cognitive ability adequate to safely complete daily activities?: Yes Is the patient deaf or have difficulty hearing?: No Does the patient have difficulty seeing, even when wearing glasses/contacts?: No Does the patient have difficulty concentrating, remembering, or making decisions?: No Patient able to express need for assistance with ADLs?: No Does the patient have difficulty dressing or bathing?: No Independently performs ADLs?: Yes (appropriate for developmental age) Does the patient have difficulty walking or climbing stairs?: Yes Weakness of Legs: Left Weakness of Arms/Hands: Left  Permission Sought/Granted Permission sought to share information with : Chartered certified accountant granted to share information with : Yes, Verbal Permission Granted              Emotional Assessment Appearance:: Appears stated age Attitude/Demeanor/Rapport: Engaged, Crying Affect (typically observed): Sad, Tearful/Crying, Angry Orientation: : Oriented to Self, Oriented to Place, Oriented to  Time, Oriented to Situation Alcohol / Substance Use: Not Applicable Psych Involvement: No (comment)  Admission diagnosis:  Hyperglycemia [R73.9] Occipital stroke [facility]) [I63.9] Patient Active Problem List   Diagnosis Date Noted  . Acute ischemic stroke ([place]) 09/06/2018  . Chronic diastolic CHF (congestive heart failure) ([place]) 05/30/2017  . Noncompliance with medication regimen   . Seizure disorder, focal motor ([place])   . Closed nondisplaced fracture of proximal phalanx of great toe   . Seizure ([place]) 01/17/2016  . Type 2 diabetes mellitus with left diabetic foot ulcer ([place]) 11/03/2015  . Hypoglycemia associated with diabetes ([place])  08/13/2015  . Hypothermia 08/13/2015  . Fall at home 08/13/2015  . UTI (urinary tract infection) 08/13/2015  . Chronic diastolic heart failure ([place]) 07/13/2015  . Pleural effusion 05/03/2015  . Calcaneal fracture 12/29/2014  . Calcaneus  fracture 12/29/2014  . Headache 10/31/2014  . Headache, migraine, intractable 10/31/2014  . Syncope 10/31/2014  . Vertigo 10/31/2014  . Poorly controlled diabetes mellitus ([place]) 10/31/2014  . Essential hypertension 10/31/2014  . HLD (hyperlipidemia) 10/31/2014  . Depression 10/31/2014  . GERD (gastroesophageal reflux disease) 10/31/2014  . Hypothyroid 10/31/2014  . Neck pain   . Nausea and vomiting 09/27/2014  . Diarrhea 09/27/2014  . Hyperkalemia 09/27/2014  . AKI (acute kidney injury) ([place]) 09/27/2014  . Diabetes mellitus ([place]) 09/27/2014  . Chest pain 04/30/2013  . Dyspnea 04/30/2013  . Type 2 diabetes mellitus with other specified complication ([place]) 94/85/4627  . GERD 12/18/2008  . CONSTIPATION, CHRONIC 12/18/2008  . DYSPHAGIA 12/18/2008  . COLONIC POLYPS, HX OF 12/18/2008   PCP:  Asencion Noble, MD Pharmacy:   [place], [place] - [place] [address] [place] [address] [place] [address] Phone: [phone] Fax: [phone]     Social Determinants of Health (SDOH) Interventions    Readmission Risk Interventions No flowsheet data found.

## 2018-09-09 NOTE — Evaluation (Signed)
Occupational Therapy Evaluation Patient Details Name: Jaclyn Herrera MRN: [id number] DOB: 1946/02/18 Today's Date: 09/09/2018    History of Present Illness Jaclyn Herrera is a 73 y.o. female with a history of diabetes, hypertension, CHF, and GERD, seizure disorder.  Patient seen after a fall yesterday.  She was trying to sit on a chair, but caught the arm and it toppled over causing her to fall.  She has an abrasion on her elbow and felt like she lost consciousness due to the pain of her abrasion.  She has had weak and blurred vision for the past 2 weeks after receiving prednisone for gout 2 weeks ago.  Denies chest pain, shortness of breath, fever, nausea, vomiting.  She does complain of some bilateral leg weakness and deficit in the left part of her vision.   Clinical Impression   Patient in bed upon therapy arrival and had recently been transferred to bed by nursing prior to therapy session. Patient agreeable to participate in OT evaluation. Transfer not completed due to recent transfer by nursing. Nursing reports that patient required 2 person assist to return to bed. Patient presents with left side Homonymous hemianopia, left side hemiparesis, decreased motor coordination, and decreased ADL performance causing difficulty completing daily tasks requiring increased physical assistance. Due to decreased motor control and endurance, patient's BUE show signs of bruising and skin abrasions. Asked if nursing could place padding on bed rails to help protect BUEs. Patient will benefit from skilled OT services to focus on mentioned deficits. D/C to SNF is recommended due to patient's extensive need for physical assistance.     Follow Up Recommendations  SNF    Equipment Recommendations  None recommended by OT       Precautions / Restrictions Precautions Precautions: Fall Precaution Comments: Left hemiparesis, left Homonymous hemianopia Restrictions Weight Bearing Restrictions: No              ADL either performed or assessed with clinical judgement   ADL Overall ADL's : Needs assistance/impaired     Grooming: Wash/dry face;Cueing for sequencing;Bed level;Minimal assistance Grooming Details (indicate cue type and reason): using left hand only for task.     General ADL Comments: Transfer not completed this date. PT has been able to transfer patient with Max Assist of 1. Nursing staff reports that it required 2 person Total assist to transfer back to bed prior to therapist arrival. Pt required NDT technique for safe transfer.      Vision Baseline Vision/History: Wears glasses Wears Glasses: Reading only Patient Visual Report: Peripheral vision impairment Vision Assessment?: Yes Eye Alignment: Within Functional Limits Ocular Range of Motion: Within Functional Limits Alignment/Gaze Preference: Head turned Tracking/Visual Pursuits: Decreased smoothness of eye movement to LEFT superior field;Decreased smoothness of eye movement to LEFT inferior field Convergence: Within functional limits Visual Fields: Left homonymous hemianopsia Depth Perception: Overshoots;Undershoots            Pertinent Vitals/Pain Pain Assessment: No/denies pain Faces Pain Scale: Hurts little more Pain Location: L hip  Pain Descriptors / Indicators: Aching;Sore Pain Intervention(s): (team aware)     Hand Dominance Right   Extremity/Trunk Assessment Upper Extremity Assessment Upper Extremity Assessment: LUE deficits/detail LUE Deficits / Details: A/ROM WFL in all ranges. Strength in shoulder, elbow, and wrist is 4/5 in all ranges. Decreased gross grasp although is equal to the right hand. Difficulty with motor coordination and control. Unable to hold arm in any position for longer than 10 seconds before dropping it.  LUE Sensation: decreased  light touch;decreased proprioception LUE Coordination: decreased gross motor;decreased fine motor   Lower Extremity Assessment Lower Extremity  Assessment: Defer to PT evaluation       Communication Communication Communication: No difficulties   Cognition Arousal/Alertness: Awake/alert Behavior During Therapy: (Emotional/tearful) Overall Cognitive Status: No family/caregiver present to determine baseline cognitive functioning                  Home Living Family/patient expects to be discharged to:: Skilled nursing facility Living Arrangements: Alone Available Help at Discharge: (unknown) Type of Home: House Home Access: Stairs to enter [employer] of Steps: 1   Home Layout: One level           Bathroom Accessibility: Yes   Home Equipment: Walker - 4 wheels;Walker - 2 wheels;Cane - single point;Shower seat;Bedside commode      Lives With: Alone    Prior Functioning/Environment Level of Independence: Independent with assistive device(s)        Comments: household and short distanced community ambulator with RW        OT Problem List: Decreased strength;Impaired vision/perception;Decreased knowledge of use of DME or AE;Decreased range of motion;Decreased coordination;Impaired UE functional use;Impaired balance (sitting and/or standing);Decreased safety awareness;Impaired sensation      OT Treatment/Interventions: Self-care/ADL training;Modalities;Balance training;Therapeutic exercise;Splinting;Neuromuscular education;Therapeutic activities;Cognitive remediation/compensation;Energy conservation;DME and/or AE instruction;Visual/perceptual remediation/compensation;Manual therapy;Patient/family education    OT Goals(Current goals can be found in the care plan section) Acute Rehab OT Goals Patient Stated Goal: return home after rehab OT Goal Formulation: With patient Time For Goal Achievement: 09/23/18 Potential to Achieve Goals: Good  OT Frequency: Min 2X/week   Barriers to D/C: Other (comment)  pt lives alone          AM-PAC OT "6 Clicks" Daily Activity     Outcome Measure Help from  another person eating meals?: A Lot Help from another person taking care of personal grooming?: Total Help from another person toileting, which includes using toliet, bedpan, or urinal?: Total Help from another person bathing (including washing, rinsing, drying)?: Total Help from another person to put on and taking off regular upper body clothing?: Total Help from another person to put on and taking off regular lower body clothing?: Total 6 Click Score: 7   End of Session Nurse Communication: Other (comment)(request for drink)  Activity Tolerance: Patient tolerated treatment well Patient left: in bed;with call bell/phone within reach;with bed alarm set  OT Visit Diagnosis: Muscle weakness (generalized) (M62.81);Low vision, both eyes (H54.2);Hemiplegia and hemiparesis Hemiplegia - Right/Left: Left Hemiplegia - dominant/non-dominant: Non-Dominant Hemiplegia - caused by: Cerebral infarction                Time: 7793-9030 OT Time Calculation (min): 29 min Charges:  OT General Charges $OT Visit: 1 Visit OT Evaluation $OT Eval Moderate Complexity: 1 Mod OT Treatments $Self Care/Home Management : 8-22 mins(15')  Ailene Ravel, OTR/L,CBIS  [phone]   [place]her, Clarene Duke 09/09/2018, 4:05 PM

## 2018-09-09 NOTE — Progress Notes (Signed)
PROGRESS NOTE    Jaclyn Herrera  RSW:[id number] DOB: [birth year] DOA: 09/06/2018 PCP: Asencion Noble, MD    Brief Narrative:  73 year old female admitted to the hospital after a fall and resultant visual field deficit.  She reports having blurred vision for approximately 2 weeks prior to admission and had been feeling weak.  She attributed this to her recent course of prednisone that she had taken for gout.  She also complains of some bilateral leg weakness.  On arrival to the emergency room, CT confirmed establish right PCA infarct with surrounding cerebral edema.  She was admitted to the hospital for further work-up.   Assessment & Plan:   Principal Problem:   Acute ischemic stroke [facility]) Active Problems:   Type 2 diabetes mellitus with other specified complication (HCC)   GERD   Essential hypertension   HLD (hyperlipidemia)   GERD (gastroesophageal reflux disease)   Chronic diastolic heart failure (HCC)   Seizure disorder, focal motor ([place])   1. Acute CVA, likely embolic.  Patient admitted with posterior circulation infarct.  She was not a candidate for TPA due to delay in arrival.  On initial CT imaging, she was noted to have established infarct with cytotoxic edema.  Patient had not been taking any chronic antiplatelet agent on a chronic basis.  She describes an allergy to aspirin.  She was initially started on Plavix and received doses on 3/28 and 3/29.  Case was discussed with neurology, Dr. Rory Percy at [facility] and concern was expressed regarding possible embolic features of stroke.  Echocardiogram was unrevealing.  She will need a 30-day event monitor.  She is currently on high-dose statin.  Since she has significant cerebral edema, she will need continued observation.  Allow permissive hypertension for another 24 hours.  Seen by physical therapy with recommendation for skilled nursing facility placement.  On 3/29, patient did complain of headache and it was noted that she had worsening  left-sided weakness.  Repeat CT head was done that showed progression of cerebral edema.  It was noted there was slight hemorrhagic conversion.  This was reviewed with radiologist and it was noted that slight hemorrhagic conversion had also been present on admission CT and did not appear to be significantly different.  Plavix dose has been held. Repeat CT head will be performed in AM since it will be approximately 5 days from stroke onset and if no change in slight hemorrhagic conversion, then Plavix will be resumed.  In house neurology consultation with Dr. Merlene Laughter was recommended to patient, but she has refused. Will plan on outpatient follow up with [facility].  No further inpatient work up planned per conversation with neurology. Continue to monitor neuro status. 2. Diabetes.  Continue on her home regimen of U-500 insulin.  Blood sugars remain elevated.  Will increase home regimen to 25 units twice daily.  Continue sliding scale.  A1c of 8.5. 3. Hypertension.  She is on quinapril and Coreg.  Blood pressures are currently stable.  Continue permissive hypertension for now. Can likely resume coreg on discharge 4. History of seizures.  Continue on Keppra. 5. Diastolic heart failure.  Appears compensated.  Continue on home dose of torsemide. 6. GERD.  Continue PPI. 7. Left hip pain, patient complains of hip pain and said she had a fall prior to admission. Will check xray.   DVT prophylaxis: SCDs Code Status: Full code Family Communication: At patient's request, her care was discussed with her cousin, Suanne Marker [phone] Disposition Plan: Skilled nursing facility on  discharge   Consultants:     Procedures:  Echocardiogram: 1. The left ventricle has normal systolic function with an ejection fraction of 60-65%. The cavity size was normal. Left ventricular diastolic Doppler parameters are consistent with impaired relaxation.  2. The right ventricle has normal systolic function. The cavity  was normal. There is no increase in right ventricular wall thickness.  3. No evidence present in the left atrial appendage.  4. The mitral valve is degenerative. Mild calcification of the anterior and posterior mitral valve leaflets. There is moderate to severe mitral annular calcification present.  5. The aortic valve is tricuspid. Mild sclerosis of the aortic valve.  6. No pulmonic valve vegetation visualized.    Antimicrobials:       Subjective: Patient is very anxious and tearful regarding her diagnosis of stroke.  She was offered neurology consultation with Dr. Merlene Laughter, but has refused.  Objective: Vitals:   09/09/18 1300 09/09/18 1400 09/09/18 1450 09/09/18 1600  BP: (!) 150/55 (!) 150/54 (!) 146/66 131/68  Pulse: 92 87 97 85  Resp: (!) 25 12 17 14   Temp:   98.5 F (36.9 C)   TempSrc:   Oral   SpO2: 99% 99% 99% 100%  Weight:      Height:        Intake/Output Summary (Last 24 hours) at 09/09/2018 1801 Last data filed at 09/09/2018 1100 Gross per 24 hour  Intake 256 ml  Output 1600 ml  Net -1344 ml   Filed Weights   09/06/18 1152 09/08/18 0650  Weight: 90.7 kg 90.5 kg    Examination:  General exam: Alert, awake, oriented x 3 Respiratory system: Clear to auscultation. Respiratory effort normal. Cardiovascular system:RRR. No murmurs, rubs, gallops. Gastrointestinal system: Abdomen is nondistended, soft and nontender. No organomegaly or masses felt. Normal bowel sounds heard. Central nervous system: Alert and oriented.  Left upper extremity is 3-4 out of 5, left lower extremity 4 out of 5.  Right upper and lower extremities are 5 out of 5.. Extremities: No C/C/E, +pedal pulses Skin: No rashes, lesions or ulcers Psychiatry: She is tearful and appears anxious   Data Reviewed: I have personally reviewed following labs and imaging studies  CBC: Recent Labs  Lab 09/06/18 1317 09/08/18 0531  WBC 7.0 5.5  NEUTROABS 5.7  --   HGB 11.1* 10.4*  HCT 32.6* 30.8*   MCV 84.5 83.7  PLT 155 329   Basic Metabolic Panel: Recent Labs  Lab 09/06/18 1317 09/08/18 0531  NA 137 136  K 4.0 3.7  CL 105 103  CO2 19* 22  GLUCOSE 370* 343*  BUN 28* 22  CREATININE 1.24* 1.13*  CALCIUM 8.9 8.0*   GFR: Estimated Creatinine Clearance: 52.1 mL/min (A) (by C-G formula based on SCr of 1.13 mg/dL (H)). Liver Function Tests: No results for input(s): AST, ALT, ALKPHOS, BILITOT, PROT, ALBUMIN in the last 168 hours. No results for input(s): LIPASE, AMYLASE in the last 168 hours. No results for input(s): AMMONIA in the last 168 hours. Coagulation Profile: No results for input(s): INR, PROTIME in the last 168 hours. Cardiac Enzymes: Recent Labs  Lab 09/06/18 1317  CKTOTAL 72   BNP (last 3 results) No results for input(s): PROBNP in the last 8760 hours. HbA1C: Recent Labs    09/07/18 0422  HGBA1C 8.5*   CBG: Recent Labs  Lab 09/08/18 1702 09/08/18 2131 09/09/18 0739 09/09/18 1135 09/09/18 1643  GLUCAP 139* 200* 268* 350* 335*   Lipid Profile: Recent Labs  09/07/18 0423  CHOL 170  HDL 38*  LDLCALC 87  TRIG 225*  CHOLHDL 4.5   Thyroid Function Tests: No results for input(s): TSH, T4TOTAL, FREET4, T3FREE, THYROIDAB in the last 72 hours. Anemia Panel: No results for input(s): VITAMINB12, FOLATE, FERRITIN, TIBC, IRON, RETICCTPCT in the last 72 hours. Sepsis Labs: No results for input(s): PROCALCITON, LATICACIDVEN in the last 168 hours.  Recent Results (from the past 240 hour(s))  MRSA PCR Screening     Status: None   Collection Time: 09/07/18  5:08 AM  Result Value Ref Range Status   MRSA by PCR NEGATIVE NEGATIVE Final    Comment:        The GeneXpert MRSA Assay (FDA approved for NASAL specimens only), is one component of a comprehensive MRSA colonization surveillance program. It is not intended to diagnose MRSA infection nor to guide or monitor treatment for MRSA infections. Performed at [facility], [address],  [place], [place] [address]          Radiology Studies: Ct Head Wo Contrast  Result Date: 09/08/2018 CLINICAL DATA:  Worsening symptoms of acute stroke after a fall. Incoordination of LEFT upper and lower extremity. EXAM: CT HEAD WITHOUT CONTRAST TECHNIQUE: Contiguous axial images were obtained from the base of the skull through the vertex without intravenous contrast. COMPARISON:  CT head 09/06/2018. MR head 09/06/2018 FINDINGS: Brain: Increased cytotoxic edema related to the large RIGHT PCA territory infarction due to proximal PCA occlusion, with mild swelling of the RIGHT occipital lobe and posterior temporal lobe. The infarcted brain appears lower in attenuation and increasingly well demarcated from surrounding normal structures. Slight hemorrhagic transformation, most prominent at the RIGHT occipital pole, is better seen, but not appreciably worse. Infarction involving the RIGHT medial temporal lobe does not appear to result in sufficient swelling to compress the brainstem at the midbrain level, nor are there new areas of infarction involving the RIGHT MCA territory. Vascular: No hyperdense vessel or unexpected calcification. Skull: Normal. Negative for fracture or focal lesion. Sinuses/Orbits: No acute finding. Other: None. IMPRESSION: Increasing cytotoxic edema related to the large RIGHT PCA territory infarction. Slight hemorrhagic transformation, most prominent at the RIGHT occipital pole, but not appreciably worse. No new areas of infarction are identified. Electronically Signed   By: Staci Righter M.D.   On: 09/08/2018 12:52        Scheduled Meds:  atorvastatin  40 mg Oral q1800   Chlorhexidine Gluconate Cloth  6 each Topical Q0600   insulin aspart  0-20 Units Subcutaneous TID WC   insulin aspart  0-5 Units Subcutaneous QHS   insulin regular human CONCENTRATED  25 Units Subcutaneous BID AC   levETIRAcetam  500 mg Oral BID   levothyroxine  125 mcg Oral Q0600   pantoprazole  40 mg  Oral Daily   PARoxetine  40 mg Oral Daily   sodium chloride flush  3 mL Intravenous Q12H   torsemide  20 mg Oral BID   vitamin B-12  1,000 mcg Oral Daily   vitamin C  500 mg Oral Daily   Continuous Infusions:   LOS: 3 days    Time spent: 15mins    Kathie Dike, MD [facility]   If 7PM-7AM, please contact night-coverage [email or web address]  09/09/2018, 6:01 PM

## 2018-09-09 NOTE — Evaluation (Signed)
Speech Language Pathology Evaluation Patient Details Name: Jaclyn Herrera MRN: [id number] DOB: January 08, 1946 Today's Date: 09/09/2018 Time: 9702-6378 SLP Time Calculation (min) (ACUTE ONLY): 25 min  Problem List:  Patient Active Problem List   Diagnosis Date Noted  . Acute ischemic stroke ([place]) 09/06/2018  . Chronic diastolic CHF (congestive heart failure) ([place]) 05/30/2017  . Noncompliance with medication regimen   . Seizure disorder, focal motor ([place])   . Closed nondisplaced fracture of proximal phalanx of great toe   . Seizure ([place]) 01/17/2016  . Type 2 diabetes mellitus with left diabetic foot ulcer ([place]) 11/03/2015  . Hypoglycemia associated with diabetes ([place]) 08/13/2015  . Hypothermia 08/13/2015  . Fall at home 08/13/2015  . UTI (urinary tract infection) 08/13/2015  . Chronic diastolic heart failure ([place]) 07/13/2015  . Pleural effusion 05/03/2015  . Calcaneal fracture 12/29/2014  . Calcaneus fracture 12/29/2014  . Headache 10/31/2014  . Headache, migraine, intractable 10/31/2014  . Syncope 10/31/2014  . Vertigo 10/31/2014  . Poorly controlled diabetes mellitus ([place]) 10/31/2014  . Essential hypertension 10/31/2014  . HLD (hyperlipidemia) 10/31/2014  . Depression 10/31/2014  . GERD (gastroesophageal reflux disease) 10/31/2014  . Hypothyroid 10/31/2014  . Neck pain   . Nausea and vomiting 09/27/2014  . Diarrhea 09/27/2014  . Hyperkalemia 09/27/2014  . AKI (acute kidney injury) ([place]) 09/27/2014  . Diabetes mellitus ([place]) 09/27/2014  . Chest pain 04/30/2013  . Dyspnea 04/30/2013  . Type 2 diabetes mellitus with other specified complication ([place]) 58/85/0277  . GERD 12/18/2008  . CONSTIPATION, CHRONIC 12/18/2008  . DYSPHAGIA 12/18/2008  . COLONIC POLYPS, HX OF 12/18/2008   Past Medical History:  Past Medical History:  Diagnosis Date  . 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 Arthritis   . Bronchitis   . CHF (congestive heart failure) ([place])   .  Chronic diastolic heart failure ([place]) 07/13/2015  . COPD (chronic obstructive pulmonary disease) ([place])   . Depression   . Diabetes mellitus   . DKA (diabetic ketoacidoses) ([address]) 09/27/2014  . GERD (gastroesophageal reflux disease)   . Headache   . Hyperlipidemia   . Hypertension   . Hypothyroid 10/31/2014  . hypothyroidism   . Pneumonia    hx of   . PONV (postoperative nausea and vomiting)    and slow to wake up after foot surgery   . Seizures ([place])    2017  . Shingles   . Syncope 10/31/2014  . Tubular adenoma of colon   . Vertigo 10/31/2014   Past Surgical History:  Past Surgical History:  Procedure Laterality Date  . ANKLE SURGERY     x 4  . APPENDECTOMY    . CATARACT EXTRACTION Bilateral   . CATARACT EXTRACTION W/ INTRAOCULAR LENS IMPLANT    . CHOLECYSTECTOMY    . COLONOSCOPY W/ BIOPSIES AND POLYPECTOMY    . ESOPHAGOGASTRODUODENOSCOPY    . HARDWARE REMOVAL Left 11/03/2015   Procedure: HARDWARE REMOVAL LEFT FOOT appliction of wound vac;  Surgeon: Gaynelle Arabian, MD;  Location: [facility];  Service: Orthopedics;  Laterality: Left;  . INCISION AND DRAINAGE OF WOUND Left 11/03/2015   Procedure: IRRIGATION AND DEBRIDEMENT LEFT FOOT ULCER;  Surgeon: Gaynelle Arabian, MD;  Location: [facility];  Service: Orthopedics;  Laterality: Left;  . KNEE ARTHROSCOPY    . NASAL SINUS SURGERY    . non cancerous mass removed     small intestine  . oophrectomy    . ORIF CALCANEOUS FRACTURE Left 12/29/2014  Procedure: OPEN REDUCTION INTERNAL FIXATION  LEFT CALCANEOUS FRACTURE;  Surgeon: Gaynelle Arabian, MD;  Location: [facility];  Service: Orthopedics;  Laterality: Left;  . TONSILLECTOMY     HPI:  73 year old female admitted to the hospital after a fall and resultant visual field deficit.  She reports having blurred vision for approximately 2 weeks prior to admission and had been feeling weak.  She attributed this to her recent course of prednisone that she had taken for gout.  She also complains of some bilateral  leg weakness.  On arrival to the emergency room, CT confirmed establish right PCA infarct with surrounding cerebral edema.  She was admitted to the hospital for further work-up. Large acute to early subacute RIGHT PCA territory infarct affecting   Assessment / Plan / Recommendation Clinical Impression  Cognitive linguistic evaluation completed. Cognitive linguistic skills essentially WNL, however Pt does present with emotional lability and abnormal pragmatics. Baseline is unknown. Pt reports that she has always been quick to become emotional. She required redirection and positive affirmations throughout the visit. Pt's primary concerns are visual changes and left hemiparesis. She was able to recall 3 of 4 words after 12 minutes independently and required a category cue for the fourth word. No family present at this time to confirm baseline. Recommend f/u SLP services at SNF for further cognitive linguistic evaluation and needs. SLP will sign off at this time. Above d/w Pt, RN, and MD.     SLP Assessment  SLP Recommendation/Assessment: All further Speech Lanaguage Pathology  needs can be addressed in the next venue of care SLP Visit Diagnosis: Cognitive communication deficit (R41.841)    Follow Up Recommendations  Skilled Nursing facility    Frequency and Duration  N/A        SLP Evaluation Cognition  Overall Cognitive Status: No family/caregiver present to determine baseline cognitive functioning Arousal/Alertness: Awake/alert Orientation Level: Oriented X4 Attention: (tangential) Memory: Appears intact Awareness: Appears intact Problem Solving: Appears intact Behaviors: Lability Safety/Judgment: Appears intact       Comprehension  Auditory Comprehension Overall Auditory Comprehension: Appears within functional limits for tasks assessed Yes/No Questions: Within Functional Limits Commands: Within Functional Limits Conversation: Complex Interfering Components:  Attention;Anxiety Visual Recognition/Discrimination Discrimination: Within Function Limits(visual deficits from stroke however) Reading Comprehension Reading Status: (single words WNL)    Expression Expression Primary Mode of Expression: Verbal Verbal Expression Overall Verbal Expression: Appears within functional limits for tasks assessed Initiation: No impairment Automatic Speech: Name;Social Response;Month of year Level of Generative/Spontaneous Verbalization: Conversation Repetition: No impairment Naming: No impairment Pragmatics: Impairment Impairments: Abnormal affect;Topic appropriateness;Turn Taking Interfering Components: Premorbid deficit(suspect premorbid) Effective Techniques: (redirection and positive affirmation) Non-Verbal Means of Communication: Not applicable Written Expression Dominant Hand: Right Written Expression: Not tested   Oral / Motor  Oral Motor/Sensory Function Overall Oral Motor/Sensory Function: Within functional limits Motor Speech Overall Motor Speech: Appears within functional limits for tasks assessed Respiration: Within functional limits Phonation: Normal Resonance: Within functional limits Articulation: Within functional limitis Intelligibility: Intelligible Motor Planning: Witnin functional limits Motor Speech Errors: Not applicable               Thank you,  Genene Churn, [place] 09/09/2018, 2:07 PM

## 2018-09-09 NOTE — Progress Notes (Signed)
Physical Therapy Treatment Patient Details Name: Jaclyn Herrera MRN: [id number] DOB: [birth year] Today's Date: 09/09/2018    History of Present Illness Jaclyn Herrera is a 73 y.o. female with a history of diabetes, hypertension, CHF, and GERD, seizure disorder.  Patient seen after a fall yesterday.  She was trying to sit on a chair, but caught the arm and it toppled over causing her to fall.  She has an abrasion on her elbow and felt like she lost consciousness due to the pain of her abrasion.  She has had weak and blurred vision for the past 2 weeks after receiving prednisone for gout 2 weeks ago.  Denies chest pain, shortness of breath, fever, nausea, vomiting.  She does complain of some bilateral leg weakness and deficit in the left part of her vision.    PT Comments    Pt continues to be very emotional throughout entire PT session regarding her situation, family issues, etc. She was c/o L hip pain throughout session. Pt min guard with supine > sit but mod - max A for STS with RW, max A for SPT and very short amb during SPT in room with RW due to weakness, L hip pain, coordination, and overall strength deficits. She continues with difficulty maintaining L hand on RW and she had significant difficulty negotiating RW and advancing BLE during SPT while demo'ing max fwd flexed trunk. Pt emotional and scared during transfer stating she felt like she was going forward and was going to fall; PT educated pt to stand up tall but pt unable to do so and PT provided max A to complete SPT to prevent LOB and fall. PT provided emotional support at EOS while pt in chair and educated her that if she wants to improve while she's in the hospital, she needs to focus on the therapy tasks at hand while trying not to get so emotional and upset because that only causes her to get distracted and increase her risk for falls; she understood and said she would try her best. Continue to recommend venue below due to her deficits so  as to improve her strength, balance, and coordination in order to reduce her risk for falls.     Follow Up Recommendations  SNF     Equipment Recommendations  None recommended by PT    Recommendations for Other Services       Precautions / Restrictions Precautions Precautions: Fall Restrictions Weight Bearing Restrictions: No    Mobility  Bed Mobility Overal bed mobility: Needs Assistance Bed Mobility: Supine to Sit     Supine to sit: Min guard     General bed mobility comments: slow labored movement with diffiuclty using LUE, also limited due to L hip pain  Transfers Overall transfer level: Needs assistance Equipment used: Rolling walker (2 wheeled) Transfers: Sit to/from [facility] Sit to Stand: Mod assist;Max assist Stand pivot transfers: Max assist       General transfer comment: very unsteady on feet, flexed forward, inability to navigate RW and difficulty stepping to transfer to chair which required max A from PT to complete SPT  Ambulation/Gait Ambulation/Gait assistance: Max assist Gait Distance (Feet): 2 Feet Assistive device: 1 person hand held assist;Rolling walker (2 wheeled) Gait Pattern/deviations: Decreased step length - right;Decreased step length - left;Decreased stance time - left;Decreased stride length Gait velocity: slow   General Gait Details: limited to 3-4 slow unsteady steps at bedside during SPT to chair, unable to hold onto RW with left  hand primarily due to decreased proprioception; cues for upright posture and max A to complete   Stairs             Wheelchair Mobility    Modified Rankin (Stroke Patients Only)       Balance Overall balance assessment: Needs assistance Sitting-balance support: Feet supported;No upper extremity supported Sitting balance-Leahy Scale: Fair Sitting balance - Comments: fair this date while sitting EOB Postural control: Other (comment)(leans forward) Standing balance  support: No upper extremity supported;During functional activity Standing balance-Leahy Scale: Poor Standing balance comment: poor using RW due unable to grip with left hand and significant forward trunk lean during standing                            Cognition Arousal/Alertness: Awake/alert Behavior During Therapy: WFL for tasks assessed/performed Overall Cognitive Status: Within Functional Limits for tasks assessed                                 General Comments: very emotional      Exercises General Exercises - Lower Extremity Ankle Circles/Pumps: Strengthening;Both;10 reps;Supine Long Arc Quad: Strengthening;Both;10 reps;Seated Heel Slides: Strengthening;Both;10 reps;Supine    General Comments        Pertinent Vitals/Pain Pain Assessment: Faces Faces Pain Scale: Hurts little more Pain Location: L hip  Pain Descriptors / Indicators: Aching;Sore Pain Intervention(s): Limited activity within patient's tolerance;Monitored during session;Repositioned;Relaxation    Home Living                      Prior Function            PT Goals (current goals can now be found in the care plan section) Acute Rehab PT Goals Patient Stated Goal: return home after rehab PT Goal Formulation: With patient Time For Goal Achievement: 09/21/18 Potential to Achieve Goals: Fair    Frequency    7X/week      PT Plan      Co-evaluation              AM-PAC PT "6 Clicks" Mobility   Outcome Measure  Help needed turning from your back to your side while in a flat bed without using bedrails?: A Little Help needed moving from lying on your back to sitting on the side of a flat bed without using bedrails?: A Lot Help needed moving to and from a bed to a chair (including a wheelchair)?: A Lot Help needed standing up from a chair using your arms (e.g., wheelchair or bedside chair)?: A Lot Help needed to walk in hospital room?: A Lot Help needed  climbing 3-5 steps with a railing? : Total 6 Click Score: 12    End of Session Equipment Utilized During Treatment: Gait belt Activity Tolerance: Patient limited by fatigue;Other (comment)(Patient limited due to c/o dizziness, nausea) Patient left: with call bell/phone within reach;in chair Nurse Communication: Mobility status PT Visit Diagnosis: Unsteadiness on feet (R26.81);Other abnormalities of gait and mobility (R26.89);Muscle weakness (generalized) (M62.81)     Time: 8003-4917 PT Time Calculation (min) (ACUTE ONLY): 31 min  Charges:  $Therapeutic Activity: 23-37 mins                        Geraldine Solar PT, DPT

## 2018-09-09 NOTE — NC FL2 (Signed)
[place] LEVEL OF CARE SCREENING TOOL     IDENTIFICATION  Patient Name: Jaclyn Herrera Birthdate: 1946-04-17 Sex: female Admission Date (Current Location): 09/06/2018  [facility] and [place] Number:  [employer] and Address:  [place] [address], [place]      Provider Number: [id number]  Attending Physician Name and Address:  Kathie Dike, MD  Relative Name and Phone Number:  Georgiann Hahn (dtr) [phone]    Current Level of Care: Hospital Recommended Level of Care: [place] Prior Approval Number: [id number]  Date Approved/Denied: 12/30/14 PASRR Number:    Discharge Plan: SNF    Current Diagnoses: Patient Active Problem List   Diagnosis Date Noted  . Acute ischemic stroke ([place]) 09/06/2018  . Chronic diastolic CHF (congestive heart failure) ([place]) 05/30/2017  . Noncompliance with medication regimen   . Seizure disorder, focal motor ([place])   . Closed nondisplaced fracture of proximal phalanx of great toe   . Seizure ([place]) 01/17/2016  . Type 2 diabetes mellitus with left diabetic foot ulcer ([place]) 11/03/2015  . Hypoglycemia associated with diabetes ([place]) 08/13/2015  . Hypothermia 08/13/2015  . Fall at home 08/13/2015  . UTI (urinary tract infection) 08/13/2015  . Chronic diastolic heart failure ([place]) 07/13/2015  . Pleural effusion 05/03/2015  . Calcaneal fracture 12/29/2014  . Calcaneus fracture 12/29/2014  . Headache 10/31/2014  . Headache, migraine, intractable 10/31/2014  . Syncope 10/31/2014  . Vertigo 10/31/2014  . Poorly controlled diabetes mellitus ([place]) 10/31/2014  . Essential hypertension 10/31/2014  . HLD (hyperlipidemia) 10/31/2014  . Depression 10/31/2014  . GERD (gastroesophageal reflux disease) 10/31/2014  . Hypothyroid 10/31/2014  . Neck pain   . Nausea and vomiting 09/27/2014  . Diarrhea 09/27/2014  . Hyperkalemia 09/27/2014  . AKI (acute kidney injury) ([place])  09/27/2014  . Diabetes mellitus ([place]) 09/27/2014  . Chest pain 04/30/2013  . Dyspnea 04/30/2013  . Type 2 diabetes mellitus with other specified complication ([place]) 08/65/7846  . GERD 12/18/2008  . CONSTIPATION, CHRONIC 12/18/2008  . DYSPHAGIA 12/18/2008  . COLONIC POLYPS, HX OF 12/18/2008    Orientation RESPIRATION BLADDER Height & Weight     Self, Time, Situation, Place  Normal Continent Weight: 199 lb 8.3 oz (90.5 kg) Height:  5\' 8"  (172.7 cm)  BEHAVIORAL SYMPTOMS/MOOD NEUROLOGICAL BOWEL NUTRITION STATUS      Continent Diet(see dc summary)  AMBULATORY STATUS COMMUNICATION OF NEEDS Skin   Extensive Assist Verbally Skin abrasions, PU Stage and Appropriate Care(buttocks wound, elbow abrasion)                       Personal Care Assistance Level of Assistance  Bathing, Feeding, Dressing Bathing Assistance: Limited assistance Feeding assistance: Limited assistance Dressing Assistance: Limited assistance     Functional Limitations Info  Sight, Hearing, Speech Sight Info: Impaired Hearing Info: Adequate Speech Info: Adequate    SPECIAL CARE FACTORS FREQUENCY  PT (By licensed PT), OT (By licensed OT)     PT Frequency: 5 times week OT Frequency: 3 times week            Contractures Contractures Info: Not present    Additional Factors Info  Code Status, Allergies, Psychotropic Code Status Info: full Allergies Info: Sulfonamide derivatives, azithromycin, bactrim, erythromycin, latex, mucinex, prednisone, aspirin, levofloxacin, tape Psychotropic Info: Paxil         Current Medications (09/09/2018):  This is the current hospital active medication list Current Facility-Administered Medications  Medication Dose  Route Frequency Provider Last Rate Last Dose  . acetaminophen (TYLENOL) tablet 650 mg  650 mg Oral Q4H PRN Truett Mainland, DO   650 mg at 09/08/18 2303   Or  . acetaminophen (TYLENOL) solution 650 mg  650 mg Per Tube Q4H PRN Truett Mainland, DO       Or   . acetaminophen (TYLENOL) suppository 650 mg  650 mg Rectal Q4H PRN Truett Mainland, DO      . atorvastatin (LIPITOR) tablet 40 mg  40 mg Oral q1800 Kathie Dike, MD   40 mg at 09/08/18 1718  . Chlorhexidine Gluconate Cloth 2 % PADS 6 each  6 each Topical Q0600 Kathie Dike, MD   6 each at 09/09/18 [phone]  . hydrALAZINE (APRESOLINE) injection 10 mg  10 mg Intravenous Q6H PRN Allie Bossier, MD   10 mg at 09/09/18 0207  . insulin aspart (novoLOG) injection 0-20 Units  0-20 Units Subcutaneous TID WC Kathie Dike, MD   15 Units at 09/09/18 1142  . insulin aspart (novoLOG) injection 0-5 Units  0-5 Units Subcutaneous QHS Truett Mainland, DO   3 Units at 09/07/18 2145  . insulin regular human CONCENTRATED (HUMULIN R) 500 UNIT/ML kwikpen 20 Units  20 Units Subcutaneous BID AC Kathie Dike, MD   20 Units at 09/09/18 [phone]  . levETIRAcetam (KEPPRA) tablet 500 mg  500 mg Oral BID Truett Mainland, DO   500 mg at 09/09/18 1025  . levothyroxine (SYNTHROID, LEVOTHROID) tablet 125 mcg  125 mcg Oral Q0600 Truett Mainland, DO   125 mcg at 09/09/18 8527  . metoprolol tartrate (LOPRESSOR) injection 5 mg  5 mg Intravenous Q8H PRN Allie Bossier, MD   5 mg at 09/08/18 0314  . pantoprazole (PROTONIX) EC tablet 40 mg  40 mg Oral Daily Truett Mainland, DO   40 mg at 09/09/18 7824  . PARoxetine (PAXIL) tablet 40 mg  40 mg Oral Daily Truett Mainland, DO   40 mg at 09/09/18 2353  . polyvinyl alcohol (LIQUIFILM TEARS) 1.4 % ophthalmic solution 1 drop  1 drop Both Eyes PRN Kathie Dike, MD   1 drop at 09/08/18 1110  . sodium chloride flush (NS) 0.9 % injection 3 mL  3 mL Intravenous Q12H Kathie Dike, MD   3 mL at 09/09/18 0938  . torsemide (DEMADEX) tablet 20 mg  20 mg Oral BID Truett Mainland, DO   20 mg at 09/09/18 0825  . vitamin B-12 (CYANOCOBALAMIN) tablet 1,000 mcg  1,000 mcg Oral Daily Truett Mainland, DO   1,000 mcg at 09/09/18 6144  . vitamin C (ASCORBIC ACID) tablet 500 mg  500 mg Oral Daily  Truett Mainland, DO   500 mg at 09/09/18 3154     Discharge Medications: Please see discharge summary for a list of discharge medications.  Relevant Imaging Results:  Relevant Lab Results:   Additional Information SSN: [id number], LCSW

## 2018-09-09 NOTE — Progress Notes (Signed)
Patient has been tearful when awake.  Rambling stories and begins praying in the middle of treatments and nursing interventions.  She is alert and oriented x4, but is not receptive to teaching at this time.   C/o pain in her L. Hip, but has good ROM.

## 2018-09-10 ENCOUNTER — Telehealth: Payer: Self-pay | Admitting: *Deleted

## 2018-09-10 ENCOUNTER — Inpatient Hospital Stay
Admission: RE | Admit: 2018-09-10 | Discharge: 2018-10-09 | Disposition: A | Discharge status: Other Acute Inpatient Hospital | Payer: Medicare Other | Source: Ambulatory Visit | Attending: Internal Medicine | Admitting: Internal Medicine

## 2018-09-10 ENCOUNTER — Other Ambulatory Visit (HOSPITAL_COMMUNITY): Payer: Medicare Other

## 2018-09-10 ENCOUNTER — Inpatient Hospital Stay (HOSPITAL_COMMUNITY): Payer: Medicare Other

## 2018-09-10 DIAGNOSIS — J3089 Other allergic rhinitis: Secondary | ICD-10-CM | POA: Diagnosis not present

## 2018-09-10 DIAGNOSIS — I480 Paroxysmal atrial fibrillation: Secondary | ICD-10-CM | POA: Diagnosis not present

## 2018-09-10 DIAGNOSIS — I639 Cerebral infarction, unspecified: Secondary | ICD-10-CM | POA: Diagnosis not present

## 2018-09-10 DIAGNOSIS — F482 Pseudobulbar affect: Secondary | ICD-10-CM | POA: Diagnosis not present

## 2018-09-10 DIAGNOSIS — R1312 Dysphagia, oropharyngeal phase: Secondary | ICD-10-CM | POA: Diagnosis not present

## 2018-09-10 DIAGNOSIS — E785 Hyperlipidemia, unspecified: Secondary | ICD-10-CM | POA: Diagnosis not present

## 2018-09-10 DIAGNOSIS — E1165 Type 2 diabetes mellitus with hyperglycemia: Secondary | ICD-10-CM | POA: Diagnosis not present

## 2018-09-10 DIAGNOSIS — I10 Essential (primary) hypertension: Secondary | ICD-10-CM | POA: Diagnosis not present

## 2018-09-10 DIAGNOSIS — F22 Delusional disorders: Secondary | ICD-10-CM | POA: Diagnosis not present

## 2018-09-10 DIAGNOSIS — Z741 Need for assistance with personal care: Secondary | ICD-10-CM | POA: Diagnosis not present

## 2018-09-10 DIAGNOSIS — Z9181 History of falling: Secondary | ICD-10-CM | POA: Diagnosis not present

## 2018-09-10 DIAGNOSIS — R05 Cough: Secondary | ICD-10-CM

## 2018-09-10 DIAGNOSIS — I63431 Cerebral infarction due to embolism of right posterior cerebral artery: Secondary | ICD-10-CM | POA: Diagnosis not present

## 2018-09-10 DIAGNOSIS — I5032 Chronic diastolic (congestive) heart failure: Secondary | ICD-10-CM | POA: Diagnosis not present

## 2018-09-10 DIAGNOSIS — R569 Unspecified convulsions: Secondary | ICD-10-CM | POA: Diagnosis not present

## 2018-09-10 DIAGNOSIS — R262 Difficulty in walking, not elsewhere classified: Secondary | ICD-10-CM | POA: Diagnosis not present

## 2018-09-10 DIAGNOSIS — I69315 Cognitive social or emotional deficit following cerebral infarction: Secondary | ICD-10-CM | POA: Diagnosis not present

## 2018-09-10 DIAGNOSIS — F329 Major depressive disorder, single episode, unspecified: Secondary | ICD-10-CM | POA: Diagnosis not present

## 2018-09-10 DIAGNOSIS — E039 Hypothyroidism, unspecified: Secondary | ICD-10-CM | POA: Diagnosis not present

## 2018-09-10 DIAGNOSIS — I63331 Cerebral infarction due to thrombosis of right posterior cerebral artery: Secondary | ICD-10-CM | POA: Diagnosis not present

## 2018-09-10 DIAGNOSIS — G40109 Localization-related (focal) (partial) symptomatic epilepsy and epileptic syndromes with simple partial seizures, not intractable, without status epilepticus: Secondary | ICD-10-CM | POA: Diagnosis not present

## 2018-09-10 DIAGNOSIS — M6281 Muscle weakness (generalized): Secondary | ICD-10-CM | POA: Diagnosis not present

## 2018-09-10 DIAGNOSIS — E1159 Type 2 diabetes mellitus with other circulatory complications: Secondary | ICD-10-CM | POA: Diagnosis not present

## 2018-09-10 DIAGNOSIS — J449 Chronic obstructive pulmonary disease, unspecified: Secondary | ICD-10-CM | POA: Diagnosis not present

## 2018-09-10 DIAGNOSIS — J309 Allergic rhinitis, unspecified: Secondary | ICD-10-CM | POA: Diagnosis not present

## 2018-09-10 DIAGNOSIS — L89606 Pressure-induced deep tissue damage of unspecified heel: Secondary | ICD-10-CM | POA: Diagnosis not present

## 2018-09-10 DIAGNOSIS — Z7401 Bed confinement status: Secondary | ICD-10-CM | POA: Diagnosis not present

## 2018-09-10 DIAGNOSIS — R52 Pain, unspecified: Principal | ICD-10-CM

## 2018-09-10 DIAGNOSIS — R059 Cough, unspecified: Secondary | ICD-10-CM

## 2018-09-10 DIAGNOSIS — E1149 Type 2 diabetes mellitus with other diabetic neurological complication: Secondary | ICD-10-CM | POA: Diagnosis not present

## 2018-09-10 DIAGNOSIS — F0391 Unspecified dementia with behavioral disturbance: Secondary | ICD-10-CM | POA: Diagnosis not present

## 2018-09-10 DIAGNOSIS — E119 Type 2 diabetes mellitus without complications: Secondary | ICD-10-CM | POA: Diagnosis not present

## 2018-09-10 DIAGNOSIS — F339 Major depressive disorder, recurrent, unspecified: Secondary | ICD-10-CM | POA: Diagnosis not present

## 2018-09-10 DIAGNOSIS — Z794 Long term (current) use of insulin: Secondary | ICD-10-CM | POA: Diagnosis not present

## 2018-09-10 DIAGNOSIS — K219 Gastro-esophageal reflux disease without esophagitis: Secondary | ICD-10-CM | POA: Diagnosis not present

## 2018-09-10 DIAGNOSIS — M25552 Pain in left hip: Secondary | ICD-10-CM | POA: Diagnosis not present

## 2018-09-10 LAB — BASIC METABOLIC PANEL
Anion gap: 11 (ref 5–15)
BUN: 31 mg/dL — ABNORMAL HIGH (ref 8–23)
CALCIUM: 8.1 mg/dL — AB (ref 8.9–10.3)
CO2: 24 mmol/L (ref 22–32)
Chloride: 106 mmol/L (ref 98–111)
Creatinine, Ser: 1.11 mg/dL — ABNORMAL HIGH (ref 0.44–1.00)
GFR calc Af Amer: 57 mL/min — ABNORMAL LOW (ref >60)
GFR calc non Af Amer: 49 mL/min — ABNORMAL LOW (ref >60)
GLUCOSE: 152 mg/dL — AB (ref 70–99)
Potassium: 3.5 mmol/L (ref 3.5–5.1)
Sodium: 141 mmol/L (ref 135–145)

## 2018-09-10 LAB — CBC
HCT: 32.9 % — ABNORMAL LOW (ref 36.0–46.0)
Hemoglobin: 10.9 g/dL — ABNORMAL LOW (ref 12.0–15.0)
MCH: 28.2 pg (ref 26.0–34.0)
MCHC: 33.1 g/dL (ref 30.0–36.0)
MCV: 85 fL (ref 80.0–100.0)
Platelets: 160 10*3/uL (ref 150–400)
RBC: 3.87 MIL/uL (ref 3.87–5.11)
RDW: 13.8 % (ref 11.5–15.5)
WBC: 5.4 10*3/uL (ref 4.0–10.5)
nRBC: 0 % (ref 0.0–0.2)

## 2018-09-10 LAB — GLUCOSE, CAPILLARY
Glucose-Capillary: 362 mg/dL — ABNORMAL HIGH (ref 70–99)
Glucose-Capillary: 252 mg/dL — ABNORMAL HIGH (ref 70–99)

## 2018-09-10 MED ORDER — ATORVASTATIN CALCIUM 40 MG PO TABS: 40.0000 mg | ORAL_TABLET | Freq: Every day | ORAL | Status: AC

## 2018-09-10 MED ORDER — ACETAMINOPHEN 325 MG PO TABS: 650.0000 mg | ORAL_TABLET | ORAL | Status: DC | PRN

## 2018-09-10 MED ORDER — CLOPIDOGREL BISULFATE 75 MG PO TABS: 75.0000 mg | ORAL_TABLET | Freq: Every day | ORAL | 11 refills | Status: AC

## 2018-09-10 NOTE — TOC Progression Note (Signed)
Transition of Care [facility]) - Progression Note    Patient Details  Name: Jaclyn Herrera MRN: [id number] Date of Birth: 18-Feb-1946  [facility]) CM/SW Contact  Shade Flood, LCSW Phone Number: 09/10/2018, 9:58 AM  Clinical Narrative:    Met with pt this AM to review SNF bed offers. Pt requests PNC. Active listening and encouragement provided to pt who is quite emotional at this time. Also updated Chaplain on pt's concerns and desire to see her. Per MD, pt likely ready for dc today after CT.    Expected Discharge Plan: [place] Barriers to Discharge: No Barriers Identified  Expected Discharge Plan and Services Expected Discharge Plan: [place] arrangements for the past 2 months: Single Family Home Expected Discharge Date: 09/10/18                         Social Determinants of Health (SDOH) Interventions    Readmission Risk Interventions No flowsheet data found.

## 2018-09-10 NOTE — Progress Notes (Signed)
OT Cancellation Note  Patient Details Name: OTTILIA PIPPENGER MRN: [id number] DOB: [birth year]   Cancelled Treatment:    Reason Eval/Treat Not Completed: Other (comment). Attempted to see pt this am with PT for co-treatment. Pt initially pleasant however quickly became emotionally labile and belittling rehab benefits. OT/PT attempted various techniques to engage pt. Pt often going off on tangents about poor quality of care (non-specific) and treatment received. Unable to redirect pt for therapeutic tasks or exercises. Pt often cursing during session, constantly in a state of self-pity. Pt reporting her left arm is paralyzed however was observed to lift it and scratch her eye, flex/extend elbow, open and close fingers. Does have coordination deficits, both fine and gross motor. Pt did laterally shift to reposition in center of bed, max A +2 for scooting to HOB. Ended treatment attempt as pt becoming more agitated and emotional with redirection attempts, preventing successful therapy.   *Of note: pt very belittling of Dr. Merlene Laughter specifically, as well as entire [facility] system. Multiple threats of suing various personnel and hospital system.   Guadelupe Sabin, OTR/L  [phone] 09/10/2018, 8:51 AM

## 2018-09-10 NOTE — Discharge Summary (Signed)
Physician Discharge Summary  Jaclyn Herrera QMG:[id number] DOB: Jun 09, 1946 DOA: 09/06/2018  PCP: Asencion Noble, MD  Admit date: 09/06/2018 Discharge date: 09/10/2018  Disposition:  [place]  Recommendations for Outpatient Follow-up:  1. Follow up with [facility] Neurology in 4 weeks.  2. Please monitor blood sugars 4 times per day and as needed when not feeling well.  3. Please adjust insulin doses as needed for better glycemic control.   Discharge Condition: STABLE   CODE STATUS: FULL    Brief Hospitalization Summary: Please see all hospital notes, images, labs for full details of the hospitalization. Dr. Glenna Durand HPI: Jaclyn Herrera is a 73 y.o. female with a history of diabetes, hypertension, CHF, and GERD, seizure disorder.  Patient seen after a fall yesterday.  She was trying to sit on a chair, but caught the arm and it toppled over causing her to fall.  She has an abrasion on her elbow and felt like she lost consciousness due to the pain of her abrasion.  She has had weak and blurred vision for the past 2 weeks after receiving prednisone for gout 2 weeks ago.  Denies chest pain, shortness of breath, fever, nausea, vomiting.  She does complain of some bilateral leg weakness and deficit in the left part of her vision.  Emergency Department Course: CT shows acute stroke.  MRA shows large right PCA territory infarct with defects in the posterior medial temporal lobe, occipital lobe and right thalamus.  Brief Admission Hx: 72 year old poorly controlled diabetic presented to the emergency department with blurred vision and weakness after taking a course of prednisone for gout.  She was noted to have acute CVA involving the right PCA territory with surrounding edema.  MDM/Assessment & Plan:   1. Acute CVA involving posterior circulation-suspected to be an embolic event however that has not been proven.  The patient was not a candidate for TPA due to delay in arrival.  The  patient has repeatedly refused inpatient neurology consultation with Dr. Merlene Laughter after repeatedly asking.  The patient has been started on statin therapy.  Skilled nursing therapy has been recommended for placement.  The patient will have a repeat CT today and if stable likely will be resumed on Plavix.  Patient will follow-up with outpatient [facility] neurology.  Dr. Roderic Palau spoke with Dr. Lorraine Lax at [facility] regarding the case and he recommended a 30-day event monitor which is being arranged.  I placed order and sent message to [facility] team regarding arrangement of monitor.  No further inpatient work-up was recommended.  The patient will follow-up outpatient for further management with [facility] Neurology.  Requested chaplain to speak with patient as she requested.  Repeated CT 3/31 unchanged.  Plavix 75 mg daily ordered for secondary stroke prevention.  2. Poorly controlled type 2 diabetes mellitus, insulin requiring-patient is highly insulin resistant and is being treated with U-500 insulin.  Her hemoglobin A1c is 8.5%.  Please adjust insulin doses as needed for better glycemic control.  Please monitor blood glucose 4 times per day  3. Essential hypertension- currently allowing for permissive hypertension in the setting of acute CVA but planning to resume carvedilol on discharge. 4. Epilepsy- continue Keppra as ordered. 5. Diastolic heart failure- resumed on home dose torsemide therapy. 6. GERD- continue PPI therapy as ordered.  DVT prophylaxis: SCDs Code Status: Full Family Communication: Patient updated at bedside Disposition Plan: SNF  Consultants:  neurology  Discharge Diagnoses:  Principal Problem:   Acute ischemic stroke [facility]) Active Problems:  Type 2 diabetes mellitus with other specified complication (HCC)   GERD   Essential hypertension   HLD (hyperlipidemia)   GERD (gastroesophageal reflux disease)   Chronic diastolic heart failure (HCC)   Seizure disorder, focal  motor [facility])  Discharge Instructions: Discharge Instructions    Ambulatory referral to Neurology   Complete by:  As directed    An appointment is requested in approximately: next available appointment, follow up stroke   Increase activity slowly   Complete by:  As directed      Allergies as of 09/10/2018      Reactions   Sulfonamide Derivatives Anaphylaxis   Azithromycin    Bactrim [sulfamethoxazole-trimethoprim] Nausea Only   Erythromycin Nausea And Vomiting   Latex Itching, Other (See Comments)   If she wears gloves too long her hands start itching   Mucinex [guaifenesin Er] Nausea And Vomiting   Prednisone Other (See Comments)   Diabetes   Aspirin Rash   Levofloxacin Rash   Tape Rash      Medication List    STOP taking these medications   acetaminophen 650 MG CR tablet Commonly known as:  TYLENOL Replaced by:  acetaminophen 325 MG tablet   carisoprodol 350 MG tablet Commonly known as:  SOMA   simvastatin 20 MG tablet Commonly known as:  ZOCOR     TAKE these medications   acetaminophen 325 MG tablet Commonly known as:  TYLENOL Take 2 tablets (650 mg total) by mouth every 4 (four) hours as needed for mild pain (or temp > 37.5 C (99.5 F)). Replaces:  acetaminophen 650 MG CR tablet   albuterol 108 (90 Base) MCG/ACT inhaler Commonly known as:  PROVENTIL HFA;VENTOLIN HFA Inhale 1-2 puffs into the lungs every 4 (four) hours as needed.   atorvastatin 40 MG tablet Commonly known as:  LIPITOR Take 1 tablet (40 mg total) by mouth daily at 6 PM.   BLINK TEARS OP Place 1 drop into both eyes 4 (four) times daily as needed (Dry eyes).   carvedilol 6.25 MG tablet Commonly known as:  COREG Take 6.25 mg by mouth 2 (two) times daily.   clopidogrel 75 MG tablet Commonly known as:  Plavix Take 1 tablet (75 mg total) by mouth daily.   colchicine 0.6 MG tablet Take 1 tablet (0.6 mg total) by mouth daily as needed. DO NOT TAKE WHILE TAKING FLUCONAZOLE   esomeprazole 20  MG capsule Commonly known as:  NEXIUM Take 20 mg by mouth daily.   fluticasone 50 MCG/ACT nasal spray Commonly known as:  FLONASE Place 2 sprays into the nose 2 (two) times daily as needed for allergies.   HUMULIN R 500 UNIT/ML injection Generic drug:  insulin regular human CONCENTRATED Inject 8-14 Units into the skin 3 (three) times daily with meals. Patient takes 8 units in the morning, 14 units in the afternoon, and 14 units in the evening   levETIRAcetam 500 MG tablet Commonly known as:  KEPPRA Take 1 tablet (500 mg total) by mouth 2 (two) times daily.   levothyroxine 125 MCG tablet Commonly known as:  SYNTHROID, LEVOTHROID Take 125 mcg by mouth daily before breakfast.   PARoxetine 40 MG tablet Commonly known as:  PAXIL Take 40 mg by mouth daily.   PERDIEM PO Take 1 tablet by mouth daily as needed (For constipation.).   potassium chloride 10 MEQ tablet Commonly known as:  K-DUR TAKE 1 TABLET BY MOUTH ONCE DAILY   quinapril 40 MG tablet Commonly known as:  ACCUPRIL Take  40 mg by mouth daily.   torsemide 20 MG tablet Commonly known as:  DEMADEX Take 20 mg by mouth 2 (two) times daily.   vitamin B-12 1000 MCG tablet Commonly known as:  CYANOCOBALAMIN Take 1,000 mcg by mouth daily.   VITAMIN C PO Take 2 tablets by mouth daily.       Contact information for follow-up providers    [facility]. Schedule an appointment as soon as possible for a visit in 4 week(s).   Why:  Hospital Follow up Stroke Contact information: [address]     [place] [address] [phone]           Contact information for after-discharge care    [facility] .   Service:  Skilled Nursing Contact information: [address] [place] [address] [phone]                 Allergies  Allergen Reactions  . Sulfonamide Derivatives Anaphylaxis  . Azithromycin    . Bactrim [Sulfamethoxazole-Trimethoprim] Nausea Only  . Erythromycin Nausea And Vomiting  . Latex Itching and Other (See Comments)    If she wears gloves too long her hands start itching  . Mucinex [Guaifenesin Er] Nausea And Vomiting  . Prednisone Other (See Comments)    Diabetes  . Aspirin Rash  . Levofloxacin Rash  . Tape Rash   Allergies as of 09/10/2018      Reactions   Sulfonamide Derivatives Anaphylaxis   Azithromycin    Bactrim [sulfamethoxazole-trimethoprim] Nausea Only   Erythromycin Nausea And Vomiting   Latex Itching, Other (See Comments)   If she wears gloves too long her hands start itching   Mucinex [guaifenesin Er] Nausea And Vomiting   Prednisone Other (See Comments)   Diabetes   Aspirin Rash   Levofloxacin Rash   Tape Rash      Medication List    STOP taking these medications   acetaminophen 650 MG CR tablet Commonly known as:  TYLENOL Replaced by:  acetaminophen 325 MG tablet   carisoprodol 350 MG tablet Commonly known as:  SOMA   simvastatin 20 MG tablet Commonly known as:  ZOCOR     TAKE these medications   acetaminophen 325 MG tablet Commonly known as:  TYLENOL Take 2 tablets (650 mg total) by mouth every 4 (four) hours as needed for mild pain (or temp > 37.5 C (99.5 F)). Replaces:  acetaminophen 650 MG CR tablet   albuterol 108 (90 Base) MCG/ACT inhaler Commonly known as:  PROVENTIL HFA;VENTOLIN HFA Inhale 1-2 puffs into the lungs every 4 (four) hours as needed.   atorvastatin 40 MG tablet Commonly known as:  LIPITOR Take 1 tablet (40 mg total) by mouth daily at 6 PM.   BLINK TEARS OP Place 1 drop into both eyes 4 (four) times daily as needed (Dry eyes).   carvedilol 6.25 MG tablet Commonly known as:  COREG Take 6.25 mg by mouth 2 (two) times daily.   clopidogrel 75 MG tablet Commonly known as:  Plavix Take 1 tablet (75 mg total) by mouth daily.   colchicine 0.6 MG tablet Take 1 tablet (0.6 mg total) by mouth daily as needed.  DO NOT TAKE WHILE TAKING FLUCONAZOLE   esomeprazole 20 MG capsule Commonly known as:  NEXIUM Take 20 mg by mouth daily.   fluticasone 50 MCG/ACT nasal spray Commonly known as:  FLONASE Place 2 sprays into the nose 2 (  two) times daily as needed for allergies.   HUMULIN R 500 UNIT/ML injection Generic drug:  insulin regular human CONCENTRATED Inject 8-14 Units into the skin 3 (three) times daily with meals. Patient takes 8 units in the morning, 14 units in the afternoon, and 14 units in the evening   levETIRAcetam 500 MG tablet Commonly known as:  KEPPRA Take 1 tablet (500 mg total) by mouth 2 (two) times daily.   levothyroxine 125 MCG tablet Commonly known as:  SYNTHROID, LEVOTHROID Take 125 mcg by mouth daily before breakfast.   PARoxetine 40 MG tablet Commonly known as:  PAXIL Take 40 mg by mouth daily.   PERDIEM PO Take 1 tablet by mouth daily as needed (For constipation.).   potassium chloride 10 MEQ tablet Commonly known as:  K-DUR TAKE 1 TABLET BY MOUTH ONCE DAILY   quinapril 40 MG tablet Commonly known as:  ACCUPRIL Take 40 mg by mouth daily.   torsemide 20 MG tablet Commonly known as:  DEMADEX Take 20 mg by mouth 2 (two) times daily.   vitamin B-12 1000 MCG tablet Commonly known as:  CYANOCOBALAMIN Take 1,000 mcg by mouth daily.   VITAMIN C PO Take 2 tablets by mouth daily.       Procedures/Studies: Dg Pelvis 1-2 Views  Result Date: 09/09/2018 CLINICAL DATA:  Left hip pain since fall three days ago. EXAM: PELVIS - 1-2 VIEW COMPARISON:  None. FINDINGS: Mild diffuse osteopenia. Mild symmetric degenerative change of the hips. No acute fracture or dislocation. Mild degenerative change of the spine. IMPRESSION: No acute findings. Mild symmetric degenerative change of the hips.  Osteopenia. Electronically Signed   By: Marin Olp M.D.   On: 09/09/2018 19:41   Ct Head Wo Contrast  Result Date: 09/10/2018 CLINICAL DATA:  Right PCA territory infarct  follow-up. EXAM: CT HEAD WITHOUT CONTRAST TECHNIQUE: Contiguous axial images were obtained from the base of the skull through the vertex without intravenous contrast. COMPARISON:  CT head dated September 08, 2018. FINDINGS: Brain: Unchanged cytotoxic edema related to large right PCA territory infarct involving the medial temporal and occipital lobes. Small foci of hemorrhagic transformation are unchanged. Increasing cytotoxic edema in the right thalamus. No evidence of hydrocephalus, extra-axial collection or mass lesion. Vascular: Calcified atherosclerosis at the skullbase. No hyperdense vessel. Skull: Negative for fracture or focal lesion. Sinuses/Orbits: No acute finding. Other: None. IMPRESSION: 1. Unchanged large right PCA territory infarct with small foci of hemorrhagic transformation. 2. Increasing cytotoxic edema in the right thalamus related to underlying infarct. Electronically Signed   By: Titus Dubin M.D.   On: 09/10/2018 10:20   Ct Head Wo Contrast  Result Date: 09/08/2018 CLINICAL DATA:  Worsening symptoms of acute stroke after a fall. Incoordination of LEFT upper and lower extremity. EXAM: CT HEAD WITHOUT CONTRAST TECHNIQUE: Contiguous axial images were obtained from the base of the skull through the vertex without intravenous contrast. COMPARISON:  CT head 09/06/2018. MR head 09/06/2018 FINDINGS: Brain: Increased cytotoxic edema related to the large RIGHT PCA territory infarction due to proximal PCA occlusion, with mild swelling of the RIGHT occipital lobe and posterior temporal lobe. The infarcted brain appears lower in attenuation and increasingly well demarcated from surrounding normal structures. Slight hemorrhagic transformation, most prominent at the RIGHT occipital pole, is better seen, but not appreciably worse. Infarction involving the RIGHT medial temporal lobe does not appear to result in sufficient swelling to compress the brainstem at the midbrain level, nor are there new areas of  infarction involving  the RIGHT MCA territory. Vascular: No hyperdense vessel or unexpected calcification. Skull: Normal. Negative for fracture or focal lesion. Sinuses/Orbits: No acute finding. Other: None. IMPRESSION: Increasing cytotoxic edema related to the large RIGHT PCA territory infarction. Slight hemorrhagic transformation, most prominent at the RIGHT occipital pole, but not appreciably worse. No new areas of infarction are identified. Electronically Signed   By: Staci Righter M.D.   On: 09/08/2018 12:52   Ct Head Wo Contrast  Result Date: 09/06/2018 CLINICAL DATA:  Syncope with recent fall EXAM: CT HEAD WITHOUT CONTRAST TECHNIQUE: Contiguous axial images were obtained from the base of the skull through the vertex without intravenous contrast. COMPARISON:  May 30, 2017 FINDINGS: Brain: There is mild diffuse atrophy. There is decreased attenuation throughout much of the right posteromedial temporal and right medial and posterior occipital lobes. There is relative sparing of the periphery of the right temporal and occipital lobes. There is no demonstrable mass, hemorrhage, extra-axial fluid collection, or midline shift. Brain parenchyma elsewhere appears unremarkable. Vascular: There is no hyperdense vessel appreciable. There is calcification in each carotid siphon region. Skull: Bony calvarium appears intact. Sinuses/Orbits: There is opacification of multiple ethmoid air cells. There is mucosal thickening in the left sphenoid sinus. There is edema in each nasal turbinates. Orbits appear symmetric bilaterally. Other: Mastoid air cells are clear. IMPRESSION: Acute appearing infarct involving a portion of the right posterior cerebral artery distribution. Specifically, there is cytotoxic edema throughout portions of the medial and posterior right temporal and right occipital lobe with sparing of a portion of the periphery of the right occipital lobe. No other acute appearing infarct evident. No mass or  hemorrhage. Foci of arterial vascular calcification noted. Areas of paranasal sinus disease noted. Electronically Signed   By: Lowella Grip III M.D.   On: 09/06/2018 14:09   Mr Jodene Nam Head Wo Contrast  Result Date: 09/06/2018 CLINICAL DATA:  Syncopal episode.  Blurry vision. EXAM: MRI HEAD WITHOUT AND WITH CONTRAST AND MRA HEAD WITHOUT CONTRAST AND MRI NECK WITHOUT AND WITH CONTRAST TECHNIQUE: Multiplanar, multiecho pulse sequences of the brain and surrounding structures were obtained without and with intravenous contrast. Angiographic images of the head were obtained using MRA technique without and with contrast. Multiplanar, multiecho pulse sequences of the neck and surrounding structures were obtained without and with intravenous contrast. CONTRAST:  Gadavist 9 mL. COMPARISON:  CT head earlier today. FINDINGS: MRI HEAD FINDINGS Brain: Large acute/early subacute RIGHT PCA territory infarct affects the posterior and medial temporal lobe, and occipital lobe. Punctate areas of acute infarction affect the RIGHT thalamus. No brainstem or cerebellar involvement. Mild T1 and T2 shortening, gyriform pattern, affecting the RIGHT occipital lobe, suggests early reperfusion hemorrhage. Only slight postcontrast enhancement. No midline shift. Mild cerebral and cerebellar atrophy. Mild subcortical and periventricular T2 and FLAIR hyperintensities, likely chronic microvascular ischemic change. Vascular: Reported separately Skull and upper cervical spine: Unremarkable Sinuses/Orbits: Mucosal thickening.  BILATERAL cataract extraction Other: None MRA HEAD FINDINGS The internal carotid arteries are widely patent. The basilar artery is widely patent with vertebrals codominant although there may be mild nonstenotic irregularity of the distal RIGHT vertebral SP at the vertebrobasilar junction. No proximal stenosis of the middle cerebral arteries. Non dominant LEFT A1 anterior cerebral artery. The RIGHT P1 PCA is completely  occluded at its origin. Faint attempts at collateralization are seen from the RIGHT PCOM. No cerebellar branch occlusion. No saccular aneurysm. MRA NECK FINDINGS Conventional branching of the great vessels from the arch. No proximal stenosis. Carotid bifurcations  demonstrate no flow reducing lesion. The common carotid arteries and internal carotid arteries are widely patent without evidence of dissection or vasculitis. No fibromuscular change. No visible ostial stenosis of the codominant vertebral arteries. Vertebral arteries appear widely patent throughout the neck into the intracranial segments to the basilar confluence. IMPRESSION: Large acute to early subacute RIGHT PCA territory infarct affecting the posteromedial temporal lobe, occipital lobe, and RIGHT thalamus. 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 No extracranial stenosis, dissection, or occlusion. Electronically Signed   By: Staci Righter M.D.   On: 09/06/2018 17:50   Mr Angiogram Neck W Or Wo Contrast  Result Date: 09/06/2018 CLINICAL DATA:  Syncopal episode.  Blurry vision. EXAM: MRI HEAD WITHOUT AND WITH CONTRAST AND MRA HEAD WITHOUT CONTRAST AND MRI NECK WITHOUT AND WITH CONTRAST TECHNIQUE: Multiplanar, multiecho pulse sequences of the brain and surrounding structures were obtained without and with intravenous contrast. Angiographic images of the head were obtained using MRA technique without and with contrast. Multiplanar, multiecho pulse sequences of the neck and surrounding structures were obtained without and with intravenous contrast. CONTRAST:  Gadavist 9 mL. COMPARISON:  CT head earlier today. FINDINGS: MRI HEAD FINDINGS Brain: Large acute/early subacute RIGHT PCA territory infarct affects the posterior and medial temporal lobe, and occipital lobe. Punctate areas of acute infarction affect the RIGHT thalamus. No brainstem or cerebellar involvement. Mild T1 and T2 shortening, gyriform pattern, affecting the RIGHT occipital  lobe, suggests early reperfusion hemorrhage. Only slight postcontrast enhancement. No midline shift. Mild cerebral and cerebellar atrophy. Mild subcortical and periventricular T2 and FLAIR hyperintensities, likely chronic microvascular ischemic change. Vascular: Reported separately Skull and upper cervical spine: Unremarkable Sinuses/Orbits: Mucosal thickening.  BILATERAL cataract extraction Other: None MRA HEAD FINDINGS The internal carotid arteries are widely patent. The basilar artery is widely patent with vertebrals codominant although there may be mild nonstenotic irregularity of the distal RIGHT vertebral SP at the vertebrobasilar junction. No proximal stenosis of the middle cerebral arteries. Non dominant LEFT A1 anterior cerebral artery. The RIGHT P1 PCA is completely occluded at its origin. Faint attempts at collateralization are seen from the RIGHT PCOM. No cerebellar branch occlusion. No saccular aneurysm. MRA NECK FINDINGS Conventional branching of the great vessels from the arch. No proximal stenosis. Carotid bifurcations demonstrate no flow reducing lesion. The common carotid arteries and internal carotid arteries are widely patent without evidence of dissection or vasculitis. No fibromuscular change. No visible ostial stenosis of the codominant vertebral arteries. Vertebral arteries appear widely patent throughout the neck into the intracranial segments to the basilar confluence. IMPRESSION: Large acute to early subacute RIGHT PCA territory infarct affecting the posteromedial temporal lobe, occipital lobe, and RIGHT thalamus. 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 No extracranial stenosis, dissection, or occlusion. Electronically Signed   By: Staci Righter M.D.   On: 09/06/2018 17:50   Mr Jeri Cos And Wo Contrast  Result Date: 09/06/2018 CLINICAL DATA:  Syncopal episode.  Blurry vision. EXAM: MRI HEAD WITHOUT AND WITH CONTRAST AND MRA HEAD WITHOUT CONTRAST AND MRI NECK WITHOUT AND  WITH CONTRAST TECHNIQUE: Multiplanar, multiecho pulse sequences of the brain and surrounding structures were obtained without and with intravenous contrast. Angiographic images of the head were obtained using MRA technique without and with contrast. Multiplanar, multiecho pulse sequences of the neck and surrounding structures were obtained without and with intravenous contrast. CONTRAST:  Gadavist 9 mL. COMPARISON:  CT head earlier today. FINDINGS: MRI HEAD FINDINGS Brain: Large acute/early subacute RIGHT PCA territory infarct affects the posterior  and medial temporal lobe, and occipital lobe. Punctate areas of acute infarction affect the RIGHT thalamus. No brainstem or cerebellar involvement. Mild T1 and T2 shortening, gyriform pattern, affecting the RIGHT occipital lobe, suggests early reperfusion hemorrhage. Only slight postcontrast enhancement. No midline shift. Mild cerebral and cerebellar atrophy. Mild subcortical and periventricular T2 and FLAIR hyperintensities, likely chronic microvascular ischemic change. Vascular: Reported separately Skull and upper cervical spine: Unremarkable Sinuses/Orbits: Mucosal thickening.  BILATERAL cataract extraction Other: None MRA HEAD FINDINGS The internal carotid arteries are widely patent. The basilar artery is widely patent with vertebrals codominant although there may be mild nonstenotic irregularity of the distal RIGHT vertebral SP at the vertebrobasilar junction. No proximal stenosis of the middle cerebral arteries. Non dominant LEFT A1 anterior cerebral artery. The RIGHT P1 PCA is completely occluded at its origin. Faint attempts at collateralization are seen from the RIGHT PCOM. No cerebellar branch occlusion. No saccular aneurysm. MRA NECK FINDINGS Conventional branching of the great vessels from the arch. No proximal stenosis. Carotid bifurcations demonstrate no flow reducing lesion. The common carotid arteries and internal carotid arteries are widely patent without  evidence of dissection or vasculitis. No fibromuscular change. No visible ostial stenosis of the codominant vertebral arteries. Vertebral arteries appear widely patent throughout the neck into the intracranial segments to the basilar confluence. IMPRESSION: Large acute to early subacute RIGHT PCA territory infarct affecting the posteromedial temporal lobe, occipital lobe, and RIGHT thalamus. 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 No extracranial stenosis, dissection, or occlusion. Electronically Signed   By: Staci Righter M.D.   On: 09/06/2018 17:50     Subjective: Patient tearful and complains that she is receiving poor health care but has no specific complaints, again refuses inpatient neurology consultation with Dr. Merlene Laughter.  Agreeable to SNF rehab.  Discharge Exam: Vitals:   09/10/18 0800 09/10/18 0900  BP: (!) 143/49 (!) 137/48  Pulse: (!) 105 (!) 46  Resp: 13 19  Temp:    SpO2: 90% (!) 78%   General exam: Awake, alert, oriented x3 in no apparent distress.  Tearful. Respiratory system: Clear. No increased work of breathing. Cardiovascular system: Normal S1 & S2 heard. No JVD, murmurs, gallops, clicks or pedal edema. Gastrointestinal system: Abdomen is nondistended, soft and nontender. Normal bowel sounds heard. Central nervous system: Alert and oriented.  Diminished power left upper extremity 3/5, left lower extremity 4/5. Extremities: no CCE with good pedal pulses. Psychiatric: Anxious and tearful.     The results of significant diagnostics from this hospitalization (including imaging, microbiology, ancillary and laboratory) are listed below for reference.     Microbiology: Recent Results (from the past 240 hour(s))  MRSA PCR Screening     Status: None   Collection Time: 09/07/18  5:08 AM  Result Value Ref Range Status   MRSA by PCR NEGATIVE NEGATIVE Final    Comment:        The GeneXpert MRSA Assay (FDA approved for NASAL specimens only), is one component of  a comprehensive MRSA colonization surveillance program. It is not intended to diagnose MRSA infection nor to guide or monitor treatment for MRSA infections. Performed at [facility], [address] [place], [place] [address]      Labs: BNP (last 3 results) No results for input(s): BNP in the last 8760 hours. Basic Metabolic Panel: Recent Labs  Lab 09/06/18 1317 09/08/18 0531 09/10/18 0413  NA 137 136 141  K 4.0 3.7 3.5  CL 105 103 106  CO2 19* 22 24  GLUCOSE 370* 343* 152*  BUN 28* 22 31*  CREATININE 1.24* 1.13* 1.11*  CALCIUM 8.9 8.0* 8.1*   Liver Function Tests: No results for input(s): AST, ALT, ALKPHOS, BILITOT, PROT, ALBUMIN in the last 168 hours. No results for input(s): LIPASE, AMYLASE in the last 168 hours. No results for input(s): AMMONIA in the last 168 hours. CBC: Recent Labs  Lab 09/06/18 1317 09/08/18 0531 09/10/18 0413  WBC 7.0 5.5 5.4  NEUTROABS 5.7  --   --   HGB 11.1* 10.4* 10.9*  HCT 32.6* 30.8* 32.9*  MCV 84.5 83.7 85.0  PLT 155 161 160   Cardiac Enzymes: Recent Labs  Lab 09/06/18 1317  CKTOTAL 72   BNP: Invalid input(s): POCBNP CBG: Recent Labs  Lab 09/09/18 0739 09/09/18 1135 09/09/18 1643 09/09/18 2239 09/10/18 0740  GLUCAP 268* 350* 335* 91 362*   D-Dimer No results for input(s): DDIMER in the last 72 hours. Hgb A1c No results for input(s): HGBA1C in the last 72 hours. Lipid Profile No results for input(s): CHOL, HDL, LDLCALC, TRIG, CHOLHDL, LDLDIRECT in the last 72 hours. Thyroid function studies No results for input(s): TSH, T4TOTAL, T3FREE, THYROIDAB in the last 72 hours.  Invalid input(s): FREET3 Anemia work up No results for input(s): VITAMINB12, FOLATE, FERRITIN, TIBC, IRON, RETICCTPCT in the last 72 hours. Urinalysis    Component Value Date/Time   COLORURINE STRAW (A) 09/07/2018 0421   APPEARANCEUR CLEAR 09/07/2018 0421   LABSPEC 1.005 09/07/2018 0421   PHURINE 5.0 09/07/2018 0421   GLUCOSEU >=500 (A)  09/07/2018 0421   HGBUR NEGATIVE 09/07/2018 0421   BILIRUBINUR NEGATIVE 09/07/2018 0421   KETONESUR 20 (A) 09/07/2018 0421   PROTEINUR NEGATIVE 09/07/2018 0421   UROBILINOGEN 0.2 12/29/2014 1717   NITRITE NEGATIVE 09/07/2018 0421   LEUKOCYTESUR NEGATIVE 09/07/2018 0421   Sepsis Labs Invalid input(s): PROCALCITONIN,  WBC,  LACTICIDVEN Microbiology Recent Results (from the past 240 hour(s))  MRSA PCR Screening     Status: None   Collection Time: 09/07/18  5:08 AM  Result Value Ref Range Status   MRSA by PCR NEGATIVE NEGATIVE Final    Comment:        The GeneXpert MRSA Assay (FDA approved for NASAL specimens only), is one component of a comprehensive MRSA colonization surveillance program. It is not intended to diagnose MRSA infection nor to guide or monitor treatment for MRSA infections. Performed at [facility], [address] [place], [place] [address]    Time coordinating discharge: 34 minutes   SIGNED:  Irwin Brakeman, MD  [facility] 09/10/2018, 10:38 AM How to contact the [facility] Attending or Consulting provider [place] or covering provider during after hours [place], for this patient?  1. Check the care team in [facility] and look for a) attending/consulting TRH provider listed and b) the [facility] team listed 2. Log into [email or web address] and use [place]'s universal password to access. If you do not have the password, please contact the hospital operator. 3. Locate the [facility] provider you are looking for under [facility] and page to a number that you can be directly reached. 4. If you still have difficulty reaching the provider, please page the [facility] (Director on Call) for the Hospitalists listed on amion for assistance.

## 2018-09-10 NOTE — Progress Notes (Signed)
Pt has been crying for the past 2 hours. She is upset about her diagnosis. She believes she will not be able to return to her old way of life. She requested to see the Chaplin this am. She would like to pray and discuss her feelings.

## 2018-09-10 NOTE — TOC Transition Note (Signed)
Transition of Care [facility]) - CM/SW Discharge Note   Patient Details  Name: Jaclyn Herrera MRN: [id number] Date of Birth: March 06, 1946  Transition of Care [facility]) CM/SW Contact:  Shade Flood, LCSW Phone Number: 09/10/2018, 11:42 AM   Clinical Narrative:    Pt clear for dc to [facility] today. DC clinical sent electronically. RN to call report and [facility] staff to transport. Pt aware and in agreement. There are no other TOC needs for dc.   Final next level of care: Skilled Nursing Facility Barriers to Discharge: No Barriers Identified   Patient Goals and CMS Choice Patient states their goals for this hospitalization and ongoing recovery are:: rehab and return home CMS Medicare.gov Compare Post Acute Care list provided to:: Patient Choice offered to / list presented to : Patient  Discharge Placement   Existing PASRR number confirmed : 09/09/18          Patient chooses bed at: [facility] Patient to be transferred to facility by: wheelchair Name of family member notified: only pt notified Patient and family notified of of transfer: 09/10/18  Discharge Plan and Services                          Social Determinants of Health (SDOH) Interventions     Readmission Risk Interventions No flowsheet data found.

## 2018-09-10 NOTE — Progress Notes (Signed)
PROGRESS NOTE    Jaclyn Herrera  NAT:[id number]  DOB: 11/26/1945  DOA: 09/06/2018 PCP: Asencion Noble, MD   Brief Admission Hx: 73 year old poorly controlled diabetic presented to the emergency department with blurred vision and weakness after taking a course of prednisone for gout.  She was noted to have acute CVA involving the right PCA territory with surrounding edema.  MDM/Assessment & Plan:   1. Acute CVA involving posterior circulation-suspected to be an embolic event however that has not been proven.  The patient was not a candidate for TPA due to delay in arrival.  The patient has refused inpatient neurology consultation with Dr. Merlene Laughter.  The patient has been started on high-dose statin therapy.  Skilled nursing therapy has been recommended for placement.  The patient will have a repeat CT today and if stable likely will be resumed on Plavix.  Patient will follow-up with outpatient [facility] neurology.  Dr. Roderic Palau spoke with Dr. Malen Gauze at [facility] regarding the case and he recommended a 30-day event monitor.  No further inpatient work-up.  The patient will follow-up outpatient for further management.  Requested chaplain to speak with patient as she requested. 2. Poorly controlled type 2 diabetes mellitus, insulin requiring-patient is highly insulin resistant and is being treated with U-500 insulin.  Her hemoglobin A1c is 8.5%. 3. Essential hypertension- currently allowing for permissive hypertension in the setting of acute CVA but planning to resume carvedilol on discharge. 4. Epilepsy- continue Keppra as ordered. 5. Diastolic heart failure- resumed on home dose torsemide therapy. 6. GERD- continue PPI therapy as ordered.  DVT prophylaxis: SCDs Code Status: Full Family Communication: Patient updated at bedside Disposition Plan: SNF when bed available  Consultants:  neurology  Procedures:    Antimicrobials:     Subjective: Patient tearful and complains that she  is receiving poor health care but has no specific complaints, again refuses inpatient neurology consultation with Dr. Merlene Laughter.  Agreeable to SNF rehab.  Objective: Vitals:   09/10/18 0605 09/10/18 0700 09/10/18 0800 09/10/18 0900  BP: (!) 156/69 (!) 163/97 (!) 143/49 (!) 137/48  Pulse: 81 84 (!) 105 (!) 46  Resp: 15 17 13 19   Temp:      TempSrc:      SpO2: 99% (!) 86% 90% (!) 78%  Weight:      Height:        Intake/Output Summary (Last 24 hours) at 09/10/2018 0950 Last data filed at 09/10/2018 0500 Gross per 24 hour  Intake -  Output 2000 ml  Net -2000 ml   Filed Weights   09/06/18 1152 09/08/18 0650  Weight: 90.7 kg 90.5 kg     REVIEW OF SYSTEMS  As per history otherwise all reviewed and reported negative  Exam:  General exam: Awake, alert, oriented x3 in no apparent distress.  Tearful. Respiratory system: Clear. No increased work of breathing. Cardiovascular system: Normal S1 & S2 heard. No JVD, murmurs, gallops, clicks or pedal edema. Gastrointestinal system: Abdomen is nondistended, soft and nontender. Normal bowel sounds heard. Central nervous system: Alert and oriented.  Diminished power left upper extremity 3/5, left lower extremity 4/5. Extremities: no CCE with good pedal pulses. Psychiatric: Anxious and tearful.  Data Reviewed: Basic Metabolic Panel: Recent Labs  Lab 09/06/18 1317 09/08/18 0531 09/10/18 0413  NA 137 136 141  K 4.0 3.7 3.5  CL 105 103 106  CO2 19* 22 24  GLUCOSE 370* 343* 152*  BUN 28* 22 31*  CREATININE 1.24* 1.13* 1.11*  CALCIUM  8.9 8.0* 8.1*   Liver Function Tests: No results for input(s): AST, ALT, ALKPHOS, BILITOT, PROT, ALBUMIN in the last 168 hours. No results for input(s): LIPASE, AMYLASE in the last 168 hours. No results for input(s): AMMONIA in the last 168 hours. CBC: Recent Labs  Lab 09/06/18 1317 09/08/18 0531 09/10/18 0413  WBC 7.0 5.5 5.4  NEUTROABS 5.7  --   --   HGB 11.1* 10.4* 10.9*  HCT 32.6* 30.8* 32.9*   MCV 84.5 83.7 85.0  PLT 155 161 160   Cardiac Enzymes: Recent Labs  Lab 09/06/18 1317  CKTOTAL 72   CBG (last 3)  Recent Labs    09/09/18 1643 09/09/18 2239 09/10/18 0740  GLUCAP 335* 91 362*   Recent Results (from the past 240 hour(s))  MRSA PCR Screening     Status: None   Collection Time: 09/07/18  5:08 AM  Result Value Ref Range Status   MRSA by PCR NEGATIVE NEGATIVE Final    Comment:        The GeneXpert MRSA Assay (FDA approved for NASAL specimens only), is one component of a comprehensive MRSA colonization surveillance program. It is not intended to diagnose MRSA infection nor to guide or monitor treatment for MRSA infections. Performed at [facility], [address] [place], [place] [address]      Studies: Dg Pelvis 1-2 Views  Result Date: 09/09/2018 CLINICAL DATA:  Left hip pain since fall three days ago. EXAM: PELVIS - 1-2 VIEW COMPARISON:  None. FINDINGS: Mild diffuse osteopenia. Mild symmetric degenerative change of the hips. No acute fracture or dislocation. Mild degenerative change of the spine. IMPRESSION: No acute findings. Mild symmetric degenerative change of the hips.  Osteopenia. Electronically Signed   By: Marin Olp M.D.   On: 09/09/2018 19:41   Ct Head Wo Contrast  Result Date: 09/08/2018 CLINICAL DATA:  Worsening symptoms of acute stroke after a fall. Incoordination of LEFT upper and lower extremity. EXAM: CT HEAD WITHOUT CONTRAST TECHNIQUE: Contiguous axial images were obtained from the base of the skull through the vertex without intravenous contrast. COMPARISON:  CT head 09/06/2018. MR head 09/06/2018 FINDINGS: Brain: Increased cytotoxic edema related to the large RIGHT PCA territory infarction due to proximal PCA occlusion, with mild swelling of the RIGHT occipital lobe and posterior temporal lobe. The infarcted brain appears lower in attenuation and increasingly well demarcated from surrounding normal structures. Slight hemorrhagic  transformation, most prominent at the RIGHT occipital pole, is better seen, but not appreciably worse. Infarction involving the RIGHT medial temporal lobe does not appear to result in sufficient swelling to compress the brainstem at the midbrain level, nor are there new areas of infarction involving the RIGHT MCA territory. Vascular: No hyperdense vessel or unexpected calcification. Skull: Normal. Negative for fracture or focal lesion. Sinuses/Orbits: No acute finding. Other: None. IMPRESSION: Increasing cytotoxic edema related to the large RIGHT PCA territory infarction. Slight hemorrhagic transformation, most prominent at the RIGHT occipital pole, but not appreciably worse. No new areas of infarction are identified. Electronically Signed   By: Staci Righter M.D.   On: 09/08/2018 12:52     Scheduled Meds: . atorvastatin  40 mg Oral q1800  . Chlorhexidine Gluconate Cloth  6 each Topical Q0600  . insulin aspart  0-20 Units Subcutaneous TID WC  . insulin aspart  0-5 Units Subcutaneous QHS  . insulin regular human CONCENTRATED  25 Units Subcutaneous BID AC  . levETIRAcetam  500 mg Oral BID  . levothyroxine  125 mcg  Oral Q0600  . pantoprazole  40 mg Oral Daily  . PARoxetine  40 mg Oral Daily  . sodium chloride flush  3 mL Intravenous Q12H  . torsemide  20 mg Oral BID  . vitamin B-12  1,000 mcg Oral Daily  . 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   1. Check the care team in [facility] and look for a) attending/consulting TRH provider listed and b) the [facility] team listed 2. Log into [email or web address] and use [place]'s universal password to access. If you do not have the password, please contact the hospital operator. 3. Locate the [facility] provider you are looking for under [facility] and page to a number that you can be directly reached. 4. If you still have difficulty reaching the provider, please page the [facility] (Director on Call) for the Hospitalists listed on amion for assistance.

## 2018-09-10 NOTE — Progress Notes (Signed)
Patient being discharged to [facility]. IV access removed. Patient only had night gown in room left hospital gown on. Purse, Phone and glasses in one bag sent with patient. Patient still not wanting to go anywhere other than [facility] but told we are doing everything for her.

## 2018-09-10 NOTE — Telephone Encounter (Signed)
-----   Message from Murlean Iba, MD sent at 09/10/2018  9:36 AM EDT ----- Regarding: 30 day monitor Jaclyn Herrera, Please set this inpatient up with a 30 day event monitor as part of her stroke work up.  She is going to discharge to a skilled nursing facility.  Thanks for all of your help.   Murvin Natal, MD

## 2018-09-10 NOTE — Discharge Instructions (Signed)
Blood Glucose Monitoring, Adult °Monitoring your blood sugar (glucose) is an important part of managing your diabetes (diabetes mellitus). Blood glucose monitoring involves checking your blood glucose as often as directed and keeping a record (log) of your results over time. °Checking your blood glucose regularly and keeping a blood glucose log can: °· Help you and your health care provider adjust your diabetes management plan as needed, including your medicines or insulin. °· Help you understand how food, exercise, illnesses, and medicines affect your blood glucose. °· Let you know what your blood glucose is at any time. You can quickly find out if you have low blood glucose (hypoglycemia) or high blood glucose (hyperglycemia). °Your health care provider will set individualized treatment goals for you. Your goals will be based on your age, other medical conditions you have, and how you respond to diabetes treatment. Generally, the goal of treatment is to maintain the following blood glucose levels: °· Before meals (preprandial): 80-130 mg/dL (4.4-7.2 mmol/L). °· After meals (postprandial): below 180 mg/dL (10 mmol/L). °· A1c level: less than 7%. °Supplies needed: °· Blood glucose meter. °· Test strips for your meter. Each meter has its own strips. You must use the strips that came with your meter. °· A needle to prick your finger (lancet). Do not use a lancet more than one time. °· A device that holds the lancet (lancing device). °· A journal or log book to write down your results. °How to check your blood glucose ° °1. Wash your hands with soap and water. °2. Prick the side of your finger (not the tip) with the lancet. Use a different finger each time. °3. Gently rub the finger until a small drop of blood appears. °4. Follow instructions that come with your meter for inserting the test strip, applying blood to the strip, and using your blood glucose meter. °5. Write down your result and any notes. °Some meters  allow you to use areas of your body other than your finger (alternative sites) to test your blood. The most common alternative sites are: °· Forearm. °· Thigh. °· Palm of the hand. °If you think you may have hypoglycemia, or if you have a history of not knowing when your blood glucose is getting low (hypoglycemia unawareness), do not use alternative sites. Use your finger instead. Alternative sites may not be as accurate as the fingers, because blood flow is slower in these areas. This means that the result you get may be delayed, and it may be different from the result that you would get from your finger. °Follow these instructions at home: °Blood glucose log ° °· Every time you check your blood glucose, write down your result. Also write down any notes about things that may be affecting your blood glucose, such as your diet and exercise for the day. This information can help you and your health care provider: °? Look for patterns in your blood glucose over time. °? Adjust your diabetes management plan as needed. °· Check if your meter allows you to download your records to a computer. Most glucose meters store a record of glucose readings in the meter. °If you have type 1 diabetes: °· Check your blood glucose 2 or more times a day. °· Also check your blood glucose: °? Before every insulin injection. °? Before and after exercise. °? Before meals. °? 2 hours after a meal. °? Occasionally between 2:00 a.m. and 3:00 a.m., as directed. °? Before potentially dangerous tasks, like driving or using heavy machinery. °?   At bedtime.  You may need to check your blood glucose more often, up to 6-10 times a day, if you: ? Use an insulin pump. ? Need multiple daily injections (MDI). ? Have diabetes that is not well-controlled. ? Are ill. ? Have a history of severe hypoglycemia. ? Have hypoglycemia unawareness. If you have type 2 diabetes:  If you take insulin or other diabetes medicines, check your blood glucose 2 or  more times a day.  If you are on intensive insulin therapy, check your blood glucose 4 or more times a day. Occasionally, you may also need to check between 2:00 a.m. and 3:00 a.m., as directed.  Also check your blood glucose: ? Before and after exercise. ? Before potentially dangerous tasks, like driving or using heavy machinery.  You may need to check your blood glucose more often if: ? Your medicine is being adjusted. ? Your diabetes is not well-controlled. ? You are ill. General tips  Always keep your supplies with you.  If you have questions or need help, all blood glucose meters have a 24-hour "hotline" phone number that you can call. You may also contact your health care provider.  After you use a few boxes of test strips, adjust (calibrate) your blood glucose meter by following instructions that came with your meter. Contact a health care provider if:  Your blood glucose is at or above 240 mg/dL (13.3 mmol/L) for 2 days in a row.  You have been sick or have had a fever for 2 days or longer, and you are not getting better.  You have any of the following problems for more than 6 hours: ? You cannot eat or drink. ? You have nausea or vomiting. ? You have diarrhea. Get help right away if:  Your blood glucose is lower than 54 mg/dL (3 mmol/L).  You become confused or you have trouble thinking clearly.  You have difficulty breathing.  You have moderate or large ketone levels in your urine. Summary  Monitoring your blood sugar (glucose) is an important part of managing your diabetes (diabetes mellitus).  Blood glucose monitoring involves checking your blood glucose as often as directed and keeping a record (log) of your results over time.  Your health care provider will set individualized treatment goals for you. Your goals will be based on your age, other medical conditions you have, and how you respond to diabetes treatment.  Every time you check your blood glucose,  write down your result. Also write down any notes about things that may be affecting your blood glucose, such as your diet and exercise for the day. This information is not intended to replace advice given to you by your health care provider. Make sure you discuss any questions you have with your health care provider. Document Released: 06/01/2003 Document Revised: 04/09/2017 Document Reviewed: 11/08/2015 Elsevier Interactive Patient Education  2019 [employer].  Hypoglycemia Hypoglycemia is when the sugar (glucose) level in your blood is too low. Signs of low blood sugar may include:  Feeling: ? Hungry. ? Worried or nervous (anxious). ? Sweaty and clammy. ? Confused. ? Dizzy. ? Sleepy. ? Sick to your stomach (nauseous).  Having: ? A fast heartbeat. ? A headache. ? A change in your vision. ? Tingling or no feeling (numbness) around your mouth, lips, or tongue. ? Jerky movements that you cannot control (seizure).  Having trouble with: ? Moving (coordination). ? Sleeping. ? Passing out (fainting). ? Getting upset easily (irritability). Low blood sugar can happen  to people who have diabetes and people who do not have diabetes. Low blood sugar can happen quickly, and it can be an emergency. Treating low blood sugar Low blood sugar is often treated by eating or drinking something sugary right away, such as:  Fruit juice, 4-6 oz (120-150 mL).  Regular soda (not diet soda), 4-6 oz (120-150 mL).  Low-fat milk, 4 oz (120 mL).  Several pieces of hard candy.  Sugar or honey, 1 Tbsp (15 mL). Treating low blood sugar if you have diabetes If you can think clearly and swallow safely, follow the 15:15 rule:  Take 15 grams of a fast-acting carb (carbohydrate). Talk with your doctor about how much you should take.  Always keep a source of fast-acting carb with you, such as: ? Sugar tablets (glucose pills). Take 3-4 pills. ? 6-8 pieces of hard candy. ? 4-6 oz (120-150 mL) of fruit  juice. ? 4-6 oz (120-150 mL) of regular (not diet) soda. ? 1 Tbsp (15 mL) honey or sugar.  Check your blood sugar 15 minutes after you take the carb.  If your blood sugar is still at or below 70 mg/dL (3.9 mmol/L), take 15 grams of a carb again.  If your blood sugar does not go above 70 mg/dL (3.9 mmol/L) after 3 tries, get help right away.  After your blood sugar goes back to normal, eat a meal or a snack within 1 hour.  Treating very low blood sugar If your blood sugar is at or below 54 mg/dL (3 mmol/L), you have very low blood sugar (severe hypoglycemia). This may also cause:  Passing out.  Jerky movements you cannot control (seizure).  Losing consciousness (coma). This is an emergency. Do not wait to see if the symptoms will go away. Get medical help right away. Call your local emergency services (911 in the [place] Do not drive yourself to the hospital. If you have very low blood sugar and you cannot eat or drink, you may need a glucagon shot (injection). A family member or friend should learn how to check your blood sugar and how to give you a glucagon shot. Ask your doctor if you need to have a glucagon shot kit at home. Follow these instructions at home: General instructions  Take over-the-counter and prescription medicines only as told by your doctor.  Stay aware of your blood sugar as told by your doctor.  Limit alcohol intake to no more than 1 drink a day for nonpregnant women and 2 drinks a day for men. One drink equals 12 oz of beer (355 mL), 5 oz of wine (148 mL), or 1 oz of hard liquor (44 mL).  Keep all follow-up visits as told by your doctor. This is important. If you have diabetes:   Follow your diabetes care plan as told by your doctor. Make sure you: ? Know the signs of low blood sugar. ? Take your medicines as told. ? Follow your exercise and meal plan. ? Eat on time. Do not skip meals. ? Check your blood sugar as often as told by your doctor. Always check  it before and after exercise. ? Follow your sick day plan when you cannot eat or drink normally. Make this plan ahead of time with your doctor.  Share your diabetes care plan with: ? Your work or school. ? People you live with.  Check your pee (urine) for ketones: ? When you are sick. ? As told by your doctor.  Carry a card or wear  jewelry that says you have diabetes. Contact a doctor if:  You have trouble keeping your blood sugar in your target range.  You have low blood sugar often. Get help right away if:  You still have symptoms after you eat or drink something sugary.  Your blood sugar is at or below 54 mg/dL (3 mmol/L).  You have jerky movements that you cannot control.  You pass out. These symptoms may be an emergency. Do not wait to see if the symptoms will go away. Get medical help right away. Call your local emergency services (911 in the [place] Do not drive yourself to the hospital. Summary  Hypoglycemia happens when the level of sugar (glucose) in your blood is too low.  Low blood sugar can happen to people who have diabetes and people who do not have diabetes. Low blood sugar can happen quickly, and it can be an emergency.  Make sure you know the signs of low blood sugar and know how to treat it.  Always keep a source of sugar (fast-acting carb) with you to treat low blood sugar. This information is not intended to replace advice given to you by your health care provider. Make sure you discuss any questions you have with your health care provider. Document Released: 08/23/2009 Document Revised: 11/20/2017 Document Reviewed: 07/02/2015 Elsevier Interactive Patient Education  2019 [employer]   IMPORTANT INFORMATION: PAY CLOSE ATTENTION   PHYSICIAN DISCHARGE INSTRUCTIONS  Follow with Primary care provider  Asencion Noble, MD  and other consultants as instructed your Hospitalist Physician  SEEK MEDICAL CARE OR RETURN TO EMERGENCY ROOM IF SYMPTOMS COME BACK,  WORSEN OR NEW PROBLEM DEVELOPS.   Please note: You were cared for by a hospitalist during your hospital stay. Every effort will be made to forward records to your primary care provider.  You can request that your primary care provider send for your hospital records if they have not received them.  Once you are discharged, your primary care physician will handle any further medical issues. Please note that NO REFILLS for any discharge medications will be authorized once you are discharged, as it is imperative that you return to your primary care physician (or establish a relationship with a primary care physician if you do not have one) for your post hospital discharge needs so that they can reassess your need for medications and monitor your lab values.  Please get a complete blood count and chemistry panel checked by your Primary MD at your next visit, and again as instructed by your Primary MD.  Get Medicines reviewed and adjusted: Please take all your medications with you for your next visit with your Primary MD  Laboratory/radiological data: Please request your Primary MD to go over all hospital tests and procedure/radiological results at the follow up, please ask your primary care provider to get all Hospital records sent to his/her office.  In some cases, they will be blood work, cultures and biopsy results pending at the time of your discharge. Please request that your primary care provider follow up on these results.  If you are diabetic, please bring your blood sugar readings with you to your follow up appointment with primary care.    Please call and make your follow up appointments as soon as possible.    Also Note the following: If you experience worsening of your admission symptoms, develop shortness of breath, life threatening emergency, suicidal or homicidal thoughts you must seek medical attention immediately by calling 911 or calling your  MD immediately  if symptoms less  severe.  You must read complete instructions/literature along with all the possible adverse reactions/side effects for all the Medicines you take and that have been prescribed to you. Take any new Medicines after you have completely understood and accpet all the possible adverse reactions/side effects.   Do not drive when taking Pain medications or sleeping medications (Benzodiazepines)  Do not take more than prescribed Pain, Sleep and Anxiety Medications. It is not advisable to combine anxiety,sleep and pain medications without talking with your primary care practitioner  Special Instructions: If you have smoked or chewed Tobacco  in the last 2 yrs please stop smoking, stop any regular Alcohol  and or any Recreational drug use.  Wear Seat belts while driving.

## 2018-09-10 NOTE — Progress Notes (Signed)
PT Cancellation Note  Patient Details Name: Jaclyn Herrera MRN: [id number] DOB: [birth year]   Cancelled Treatment:    Reason Eval/Treat Not Completed: Other (comment)(Emotionally unstable, very sad, constant c/o life)  Attempted PT/OT session.  Pt initially very pleasant.  As attempted bed mobility and therex pt became very emotional, tearful and constant complaints of current mobility status as well as of the overall care received although unable to specify what is wrong.  Attempted bed mobility and therex and explained benefits of completing rehab.  Pt became aggitated and emotional with redirectional attempts preventing successful therapy.  *Of note: pt very belitting Dr Jaclyn Herrera and Jaclyn Herrera, multiple threats made for suing [facility].  [address] LPTA; [place] Jaclyn Herrera 09/10/2018, 8:53 AM

## 2018-09-11 ENCOUNTER — Encounter: Payer: Self-pay | Admitting: Internal Medicine

## 2018-09-11 ENCOUNTER — Non-Acute Institutional Stay (SKILLED_NURSING_FACILITY): Payer: Medicare Other | Admitting: Internal Medicine

## 2018-09-11 DIAGNOSIS — E785 Hyperlipidemia, unspecified: Secondary | ICD-10-CM

## 2018-09-11 DIAGNOSIS — R569 Unspecified convulsions: Secondary | ICD-10-CM

## 2018-09-11 DIAGNOSIS — F32A Depression, unspecified: Secondary | ICD-10-CM

## 2018-09-11 DIAGNOSIS — I5032 Chronic diastolic (congestive) heart failure: Secondary | ICD-10-CM | POA: Diagnosis not present

## 2018-09-11 DIAGNOSIS — I639 Cerebral infarction, unspecified: Secondary | ICD-10-CM | POA: Diagnosis not present

## 2018-09-11 DIAGNOSIS — G40109 Localization-related (focal) (partial) symptomatic epilepsy and epileptic syndromes with simple partial seizures, not intractable, without status epilepticus: Secondary | ICD-10-CM | POA: Diagnosis not present

## 2018-09-11 DIAGNOSIS — F329 Major depressive disorder, single episode, unspecified: Secondary | ICD-10-CM

## 2018-09-11 NOTE — Progress Notes (Signed)
Provider:  Veleta Miners, MD Location:  [facility] Room Number: 193 X Place of Service:  SNF (31)  PCP: Asencion Noble, MD Patient Care Team: Asencion Noble, MD as PCP - General (Internal Medicine) Burnell Blanks, MD as PCP - Cardiology (Cardiology)  Extended Emergency Contact Information Primary Emergency Contact: [place], [place] [address] Johnnette Litter of [place] Phone: [phone] Mobile Phone: [phone] Relation: Daughter Secondary Emergency Contact: [place] of [place] Phone: [phone] Relation: Friend  Code Status: Full Code Goals of Care: Advanced Directive information Advanced Directives 09/11/2018  Does Patient Have a Medical Advance Directive? No  Type of Advance Directive -  Does patient want to make changes to medical advance directive? -  Copy of [place] in Chart? -  Would patient like information on creating a medical advance directive? No - Patient declined      Chief Complaint  Patient presents with   New Admit To SNF    Admission    HPI: Patient is a 73 y.o. female seen today for admission to SNF for therapy Patient was admitted in the hospital from 03/27-03/31 for acute CVA involving posterior circulation.  Patient has a history of uncontrolled diabetes, hypertension, CHF, gout seizure disorder, anxiety, GERD  Patient lives by herself and it seems she was getting weak and was having blurred vision for few days but then she fell and decided to call EMS as she thought she had a stroke.  She had an MRI done which showed large right PICA territory infarct with defects in posterior medial temporal lobe and occipital lobe and right thalamus  She refused to see Dr. Merlene Laughter for neurology consult.  She was not given any TPA due to  delay.  She was eventually started on Plavix.  The cardiology recommended 30-day event monitor to rule out the emboli source  In  Facility initially patient was very anxious and said that she does not want to live because of her stroke.  But eventually she has calm down and is doing better.  She denies any suicidal ideations right now.  She does want something for anxiety. She lives by herself.  Has 1 stepdaughter who lives in [place].  Does not have any family around. 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    Arthritis    Bronchitis    CHF (congestive heart failure) (HCC)    Chronic diastolic heart failure ([place]) 07/13/2015   COPD (chronic obstructive pulmonary disease) (HCC)    Depression    Diabetes mellitus    DKA (diabetic ketoacidoses) ([place]) 09/27/2014   GERD (gastroesophageal reflux disease)    Headache    Hyperlipidemia    Hypertension    Hypothyroid 10/31/2014   hypothyroidism    Pneumonia    hx of    PONV (postoperative nausea and vomiting)    and slow to wake up after foot surgery    Seizures ([place])    2017   Shingles    Syncope 10/31/2014   Tubular adenoma of colon    Vertigo 10/31/2014   Past Surgical History:  Procedure Laterality Date   ANKLE SURGERY     x 4   APPENDECTOMY     CATARACT EXTRACTION Bilateral    CATARACT EXTRACTION W/ INTRAOCULAR LENS IMPLANT  CHOLECYSTECTOMY     COLONOSCOPY W/ BIOPSIES AND POLYPECTOMY     ESOPHAGOGASTRODUODENOSCOPY     HARDWARE REMOVAL Left 11/03/2015   Procedure: HARDWARE REMOVAL LEFT FOOT appliction of wound vac;  Surgeon: Gaynelle Arabian, MD;  Location: [facility];  Service: Orthopedics;  Laterality: Left;   INCISION AND DRAINAGE OF WOUND Left 11/03/2015   Procedure: IRRIGATION AND DEBRIDEMENT LEFT FOOT ULCER;  Surgeon: Gaynelle Arabian, MD;  Location: [facility];  Service: Orthopedics;  Laterality: Left;   KNEE ARTHROSCOPY     NASAL SINUS SURGERY     non cancerous mass removed     small intestine   oophrectomy     ORIF  CALCANEOUS FRACTURE Left 12/29/2014   Procedure: OPEN REDUCTION INTERNAL FIXATION  LEFT CALCANEOUS FRACTURE;  Surgeon: Gaynelle Arabian, MD;  Location: [facility];  Service: Orthopedics;  Laterality: Left;   TONSILLECTOMY      reports that she has never smoked. She has never used smokeless tobacco. She reports that she does not drink alcohol or use drugs. Social History   Socioeconomic History   Marital status: Widowed    Spouse name: Not on file   Number of children: Not on file   Years of education: Not on file   Highest education level: Not on file  Occupational History   Occupation: Retired-[employer]  Social Needs   Financial resource strain: Not on file   Food insecurity:    Worry: Not on file    Inability: Not on file   Transportation needs:    Medical: Not on file    Non-medical: Not on file  Tobacco Use   Smoking status: Never Smoker   Smokeless tobacco: Never Used  Substance and Sexual Activity   Alcohol use: No   Drug use: No   Sexual activity: Not Currently  Lifestyle   Physical activity:    Days per week: Not on file    Minutes per session: Not on file   Stress: Not on file  Relationships   Social connections:    Talks on phone: Not on file    Gets together: Not on file    Attends religious service: Not on file    Active member of club or organization: Not on file    Attends meetings of clubs or organizations: Not on file    Relationship status: Not on file   Intimate partner violence:    Fear of current or ex partner: Not on file    Emotionally abused: Not on file    Physically abused: Not on file    Forced sexual activity: Not on file  Other Topics Concern   Not on file  Social History Narrative   The patient is widowed. She is retired from [employer]. No children.   One or 2 caffeinated beverages daily   She has split time between [place] in the winter and [place] in the [place]    Functional Status Survey:    Family History  Problem  Relation Age of Onset   Diabetes Mother    Asthma Mother    Hypertension Father    Heart disease Father    Diabetes Father    Heart disease Sister    Hypertension Sister    Asthma Sister    Diabetes Brother    CAD Brother    Stroke Brother    Stomach cancer Maternal Aunt     Health Maintenance  Topic Date Due   FOOT EXAM  10/11/2018 (Originally 10/29/1955)   [place]  10/11/2018 (Originally 10/29/1955)   TETANUS/TDAP  10/11/2018 (Originally 10/29/1964)   Hepatitis C Screening  10/11/2018 (Originally 11-25-1945)   PNA vac Low Risk Adult (2 of 2 - PCV13) 10/11/2018 (Originally 04/12/2013)   INFLUENZA VACCINE  01/11/2019   HEMOGLOBIN A1C  03/10/2019   MAMMOGRAM  04/28/2019   COLONOSCOPY  01/11/2022   DEXA SCAN  Completed    Allergies  Allergen Reactions   Sulfonamide Derivatives Anaphylaxis   Azithromycin    Bactrim [Sulfamethoxazole-Trimethoprim] Nausea Only   Erythromycin Nausea And Vomiting   Latex Itching and Other (See Comments)    If she wears gloves too long her hands start itching   Mucinex [Guaifenesin Er] Nausea And Vomiting   Prednisone Other (See Comments)    Diabetes   Aspirin Rash   Levofloxacin Rash   Tape Rash    Outpatient Encounter Medications as of 09/11/2018  Medication Sig   acetaminophen (TYLENOL) 325 MG tablet Take 2 tablets (650 mg total) by mouth every 4 (four) hours as needed for mild pain (or temp > 37.5 C (99.5 F)).   albuterol (PROVENTIL HFA;VENTOLIN HFA) 108 (90 Base) MCG/ACT inhaler Inhale 2 puffs into the lungs every 4 (four) hours as needed for wheezing or shortness of breath.    Ascorbic Acid (VITAMIN C PO) Take 2 tablets by mouth daily.   atorvastatin (LIPITOR) 40 MG tablet Take 1 tablet (40 mg total) by mouth daily at 6 PM.   carvedilol (COREG) 6.25 MG tablet Take 6.25 mg by mouth 2 (two) times daily.   clopidogrel (PLAVIX) 75 MG tablet Take 1 tablet (75 mg total) by mouth daily.    colchicine 0.6 MG tablet Take 0.6 mg by mouth daily as needed.   fluticasone (FLONASE) 50 MCG/ACT nasal spray Place 2 sprays into the nose 2 (two) times daily as needed for allergies.    Insulin Regular Human (HUMULIN R U-500 KWIKPEN [place]) Inject 8 units subcutaneously in the morning and 14 units with lunch and supper   levETIRAcetam (KEPPRA) 500 MG tablet Take 1 tablet (500 mg total) by mouth 2 (two) times daily.   levothyroxine (SYNTHROID, LEVOTHROID) 125 MCG tablet Take 125 mcg by mouth daily before breakfast.    NON FORMULARY Diet Type:  NAS, Consistent Carbohydrate   pantoprazole (PROTONIX) 20 MG tablet Take 20 mg by mouth daily.   PARoxetine (PAXIL) 40 MG tablet Take 40 mg by mouth daily.   Polyethylene Glycol 400 (BLINK TEARS OP) Place 1 drop into both eyes 4 (four) times daily as needed (Dry eyes).    potassium chloride (K-DUR) 10 MEQ tablet TAKE 1 TABLET BY MOUTH ONCE DAILY   quinapril (ACCUPRIL) 40 MG tablet Take 40 mg by mouth daily.    Senna-Psyllium (PERDIEM PO) Take 1 tablet by mouth daily as needed (For constipation.).    torsemide (DEMADEX) 20 MG tablet Take 20 mg by mouth 2 (two) times daily.   vitamin B-12 (CYANOCOBALAMIN) 1000 MCG tablet Take 1,000 mcg by mouth daily.   [DISCONTINUED] colchicine 0.6 MG tablet Take 1 tablet (0.6 mg total) by mouth daily as needed. DO NOT TAKE WHILE TAKING FLUCONAZOLE (Patient not taking: Reported on 09/11/2018)   [DISCONTINUED] esomeprazole (NEXIUM) 20 MG capsule Take 20 mg by mouth daily.    [DISCONTINUED] HUMULIN R 500 UNIT/ML injection Patient takes 8 units in the morning, 14 units with lunch and supper   No facility-administered encounter medications on file as of 09/11/2018.     Review of Systems  Constitutional: Positive for activity change.  HENT: Negative.   Respiratory: Negative.   Cardiovascular: Negative.   Gastrointestinal: Negative.   Genitourinary: Negative.   Musculoskeletal: Negative.   Skin: Negative.     Neurological: Positive for weakness.  Psychiatric/Behavioral: Positive for agitation. The patient is nervous/anxious.     Vitals:   09/11/18 0940  BP: (!) 151/71  Pulse: 96  Resp: 20  Temp: 98.5 F (36.9 C)  Weight: 197 lb 3.2 oz (89.4 kg)  Height: 5' 8.5" (1.74 m)   Body mass index is 29.55 kg/m. Physical Exam Vitals signs reviewed.  Constitutional:      Appearance: Normal appearance.  HENT:     Head: Normocephalic.     Nose: Nose normal.     Mouth/Throat:     Mouth: Mucous membranes are moist.     Pharynx: Oropharynx is clear.  Eyes:     Pupils: Pupils are equal, round, and reactive to light.  Neck:     Musculoskeletal: Neck supple.  Cardiovascular:     Rate and Rhythm: Normal rate and regular rhythm.     Pulses: Normal pulses.     Heart sounds: Normal heart sounds.  Pulmonary:     Effort: Pulmonary effort is normal.     Breath sounds: Normal breath sounds.  Abdominal:     General: Abdomen is flat. Bowel sounds are normal. There is no distension.     Palpations: Abdomen is soft.     Tenderness: There is no abdominal tenderness. There is no guarding.  Musculoskeletal:     Comments: Mild swelling in her Right LE with discoloration around ankles which patient says is old  Skin:    General: Skin is warm and dry.  Neurological:     Mental Status: She is alert and oriented to person, place, and time.     Comments: Has Mild weakness in Left UE and LE with some incoordination. Also has peripheral loss of vision in Left Eye  Psychiatric:        Mood and Affect: Mood normal.        Thought Content: Thought content normal.     Comments: Patient is very anxious denies suicide ideation right now Does go tangential in talking     Labs reviewed: Basic Metabolic Panel: Recent Labs    09/06/18 1317 09/08/18 0531 09/10/18 0413  NA 137 136 141  K 4.0 3.7 3.5  CL 105 103 106  CO2 19* 22 24  GLUCOSE 370* 343* 152*  BUN 28* 22 31*  CREATININE 1.24* 1.13* 1.11*   CALCIUM 8.9 8.0* 8.1*   Liver Function Tests: No results for input(s): AST, ALT, ALKPHOS, BILITOT, PROT, ALBUMIN in the last 8760 hours. No results for input(s): LIPASE, AMYLASE in the last 8760 hours. No results for input(s): AMMONIA in the last 8760 hours. CBC: Recent Labs    09/06/18 1317 09/08/18 0531 09/10/18 0413  WBC 7.0 5.5 5.4  NEUTROABS 5.7  --   --   HGB 11.1* 10.4* 10.9*  HCT 32.6* 30.8* 32.9*  MCV 84.5 83.7 85.0  PLT 155 161 160   Cardiac Enzymes: Recent Labs    09/06/18 1317  CKTOTAL 72   BNP: Invalid input(s): POCBNP Lab Results  Component Value Date   HGBA1C 8.5 (H) 09/07/2018   Lab Results  Component Value Date   TSH 1.655 01/17/2016   Lab Results  Component Value Date   VITAMINB12 1,121 (H) 05/31/2017   Lab Results  Component Value Date   FOLATE 22.5 01/19/2016   Lab  Results  Component Value Date   IRON 54 01/19/2016   TIBC 287 01/19/2016   FERRITIN 91 01/19/2016    Imaging and Procedures obtained prior to SNF admission: Ct Head Wo Contrast  Result Date: 09/06/2018 CLINICAL DATA:  Syncope with recent fall EXAM: CT HEAD WITHOUT CONTRAST TECHNIQUE: Contiguous axial images were obtained from the base of the skull through the vertex without intravenous contrast. COMPARISON:  May 30, 2017 FINDINGS: Brain: There is mild diffuse atrophy. There is decreased attenuation throughout much of the right posteromedial temporal and right medial and posterior occipital lobes. There is relative sparing of the periphery of the right temporal and occipital lobes. There is no demonstrable mass, hemorrhage, extra-axial fluid collection, or midline shift. Brain parenchyma elsewhere appears unremarkable. Vascular: There is no hyperdense vessel appreciable. There is calcification in each carotid siphon region. Skull: Bony calvarium appears intact. Sinuses/Orbits: There is opacification of multiple ethmoid air cells. There is mucosal thickening in the left sphenoid  sinus. There is edema in each nasal turbinates. Orbits appear symmetric bilaterally. Other: Mastoid air cells are clear. IMPRESSION: Acute appearing infarct involving a portion of the right posterior cerebral artery distribution. Specifically, there is cytotoxic edema throughout portions of the medial and posterior right temporal and right occipital lobe with sparing of a portion of the periphery of the right occipital lobe. No other acute appearing infarct evident. No mass or hemorrhage. Foci of arterial vascular calcification noted. Areas of paranasal sinus disease noted. Electronically Signed   By: Lowella Grip III M.D.   On: 09/06/2018 14:09   Mr Jodene Nam Head Wo Contrast  Result Date: 09/06/2018 CLINICAL DATA:  Syncopal episode.  Blurry vision. EXAM: MRI HEAD WITHOUT AND WITH CONTRAST AND MRA HEAD WITHOUT CONTRAST AND MRI NECK WITHOUT AND WITH CONTRAST TECHNIQUE: Multiplanar, multiecho pulse sequences of the brain and surrounding structures were obtained without and with intravenous contrast. Angiographic images of the head were obtained using MRA technique without and with contrast. Multiplanar, multiecho pulse sequences of the neck and surrounding structures were obtained without and with intravenous contrast. CONTRAST:  Gadavist 9 mL. COMPARISON:  CT head earlier today. FINDINGS: MRI HEAD FINDINGS Brain: Large acute/early subacute RIGHT PCA territory infarct affects the posterior and medial temporal lobe, and occipital lobe. Punctate areas of acute infarction affect the RIGHT thalamus. No brainstem or cerebellar involvement. Mild T1 and T2 shortening, gyriform pattern, affecting the RIGHT occipital lobe, suggests early reperfusion hemorrhage. Only slight postcontrast enhancement. No midline shift. Mild cerebral and cerebellar atrophy. Mild subcortical and periventricular T2 and FLAIR hyperintensities, likely chronic microvascular ischemic change. Vascular: Reported separately Skull and upper cervical  spine: Unremarkable Sinuses/Orbits: Mucosal thickening.  BILATERAL cataract extraction Other: None MRA HEAD FINDINGS The internal carotid arteries are widely patent. The basilar artery is widely patent with vertebrals codominant although there may be mild nonstenotic irregularity of the distal RIGHT vertebral SP at the vertebrobasilar junction. No proximal stenosis of the middle cerebral arteries. Non dominant LEFT A1 anterior cerebral artery. The RIGHT P1 PCA is completely occluded at its origin. Faint attempts at collateralization are seen from the RIGHT PCOM. No cerebellar branch occlusion. No saccular aneurysm. MRA NECK FINDINGS Conventional branching of the great vessels from the arch. No proximal stenosis. Carotid bifurcations demonstrate no flow reducing lesion. The common carotid arteries and internal carotid arteries are widely patent without evidence of dissection or vasculitis. No fibromuscular change. No visible ostial stenosis of the codominant vertebral arteries. Vertebral arteries appear widely patent throughout the neck into  the intracranial segments to the basilar confluence. IMPRESSION: Large acute to early subacute RIGHT PCA territory infarct affecting the posteromedial temporal lobe, occipital lobe, and RIGHT thalamus. 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 No extracranial stenosis, dissection, or occlusion. Electronically Signed   By: Staci Righter M.D.   On: 09/06/2018 17:50   Mr Angiogram Neck W Or Wo Contrast  Result Date: 09/06/2018 CLINICAL DATA:  Syncopal episode.  Blurry vision. EXAM: MRI HEAD WITHOUT AND WITH CONTRAST AND MRA HEAD WITHOUT CONTRAST AND MRI NECK WITHOUT AND WITH CONTRAST TECHNIQUE: Multiplanar, multiecho pulse sequences of the brain and surrounding structures were obtained without and with intravenous contrast. Angiographic images of the head were obtained using MRA technique without and with contrast. Multiplanar, multiecho pulse sequences of the neck  and surrounding structures were obtained without and with intravenous contrast. CONTRAST:  Gadavist 9 mL. COMPARISON:  CT head earlier today. FINDINGS: MRI HEAD FINDINGS Brain: Large acute/early subacute RIGHT PCA territory infarct affects the posterior and medial temporal lobe, and occipital lobe. Punctate areas of acute infarction affect the RIGHT thalamus. No brainstem or cerebellar involvement. Mild T1 and T2 shortening, gyriform pattern, affecting the RIGHT occipital lobe, suggests early reperfusion hemorrhage. Only slight postcontrast enhancement. No midline shift. Mild cerebral and cerebellar atrophy. Mild subcortical and periventricular T2 and FLAIR hyperintensities, likely chronic microvascular ischemic change. Vascular: Reported separately Skull and upper cervical spine: Unremarkable Sinuses/Orbits: Mucosal thickening.  BILATERAL cataract extraction Other: None MRA HEAD FINDINGS The internal carotid arteries are widely patent. The basilar artery is widely patent with vertebrals codominant although there may be mild nonstenotic irregularity of the distal RIGHT vertebral SP at the vertebrobasilar junction. No proximal stenosis of the middle cerebral arteries. Non dominant LEFT A1 anterior cerebral artery. The RIGHT P1 PCA is completely occluded at its origin. Faint attempts at collateralization are seen from the RIGHT PCOM. No cerebellar branch occlusion. No saccular aneurysm. MRA NECK FINDINGS Conventional branching of the great vessels from the arch. No proximal stenosis. Carotid bifurcations demonstrate no flow reducing lesion. The common carotid arteries and internal carotid arteries are widely patent without evidence of dissection or vasculitis. No fibromuscular change. No visible ostial stenosis of the codominant vertebral arteries. Vertebral arteries appear widely patent throughout the neck into the intracranial segments to the basilar confluence. IMPRESSION: Large acute to early subacute RIGHT PCA  territory infarct affecting the posteromedial temporal lobe, occipital lobe, and RIGHT thalamus. 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 No extracranial stenosis, dissection, or occlusion. Electronically Signed   By: Staci Righter M.D.   On: 09/06/2018 17:50   Mr Jeri Cos And Wo Contrast  Result Date: 09/06/2018 CLINICAL DATA:  Syncopal episode.  Blurry vision. EXAM: MRI HEAD WITHOUT AND WITH CONTRAST AND MRA HEAD WITHOUT CONTRAST AND MRI NECK WITHOUT AND WITH CONTRAST TECHNIQUE: Multiplanar, multiecho pulse sequences of the brain and surrounding structures were obtained without and with intravenous contrast. Angiographic images of the head were obtained using MRA technique without and with contrast. Multiplanar, multiecho pulse sequences of the neck and surrounding structures were obtained without and with intravenous contrast. CONTRAST:  Gadavist 9 mL. COMPARISON:  CT head earlier today. FINDINGS: MRI HEAD FINDINGS Brain: Large acute/early subacute RIGHT PCA territory infarct affects the posterior and medial temporal lobe, and occipital lobe. Punctate areas of acute infarction affect the RIGHT thalamus. No brainstem or cerebellar involvement. Mild T1 and T2 shortening, gyriform pattern, affecting the RIGHT occipital lobe, suggests early reperfusion hemorrhage. Only slight postcontrast enhancement. No midline  shift. Mild cerebral and cerebellar atrophy. Mild subcortical and periventricular T2 and FLAIR hyperintensities, likely chronic microvascular ischemic change. Vascular: Reported separately Skull and upper cervical spine: Unremarkable Sinuses/Orbits: Mucosal thickening.  BILATERAL cataract extraction Other: None MRA HEAD FINDINGS The internal carotid arteries are widely patent. The basilar artery is widely patent with vertebrals codominant although there may be mild nonstenotic irregularity of the distal RIGHT vertebral SP at the vertebrobasilar junction. No proximal stenosis of the middle  cerebral arteries. Non dominant LEFT A1 anterior cerebral artery. The RIGHT P1 PCA is completely occluded at its origin. Faint attempts at collateralization are seen from the RIGHT PCOM. No cerebellar branch occlusion. No saccular aneurysm. MRA NECK FINDINGS Conventional branching of the great vessels from the arch. No proximal stenosis. Carotid bifurcations demonstrate no flow reducing lesion. The common carotid arteries and internal carotid arteries are widely patent without evidence of dissection or vasculitis. No fibromuscular change. No visible ostial stenosis of the codominant vertebral arteries. Vertebral arteries appear widely patent throughout the neck into the intracranial segments to the basilar confluence. IMPRESSION: Large acute to early subacute RIGHT PCA territory infarct affecting the posteromedial temporal lobe, occipital lobe, and RIGHT thalamus. 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 No extracranial stenosis, dissection, or occlusion. Electronically Signed   By: Staci Righter M.D.   On: 09/06/2018 17:50    Assessment/Plan Acute ischemic stroke  Started on Plavix in the hospital Event monitor from Cardiology Follow up with [facility] Neurology as she would not  follow with [facility]rology BP midily high Will continue to moniotr ON statin Chronic diastolic CHF (congestive heart failure)  Echo showed EF of 60% On Toresimide monitor weights Diabtes Uncontrolled On Short acting insulin 2 times a day Refuses Long acting insulin Will incease her morning insulin to 10 units'and 20 units for Lunch and dinner Will start on Novolog 4 units bolus with meals if BS more then 200  Seizure  Continue Keppra  Hyperlipidemia, Started on Statin LDL was 87 in  hospital  Depression with anxiety Will start her on ativan PRN for 2 weeks Already on Paxil Hypothyroid Repeat TSH Continue supplement Hypertension On Coreg and Norvasc will continue to  monitor     Family/ staff Communication:   Labs/tests ordered:CBC,BMP, TSH in 1 week Total time spent in this patient care encounter was _45 minutes; greater than 50% of the visit spent counseling patient, reviewing records , Labs and coordinating care for problems addressed at this encounter.

## 2018-09-12 ENCOUNTER — Other Ambulatory Visit: Payer: Self-pay | Admitting: Internal Medicine

## 2018-09-12 MED ORDER — HYDROCODONE-ACETAMINOPHEN 5-325 MG PO TABS: 1.0000 | ORAL_TABLET | Freq: Three times a day (TID) | ORAL | 0 refills | Status: DC | PRN

## 2018-09-12 MED ORDER — LORAZEPAM 0.5 MG PO TABS: 0.5000 mg | ORAL_TABLET | Freq: Three times a day (TID) | ORAL | 0 refills | Status: DC | PRN

## 2018-09-13 ENCOUNTER — Non-Acute Institutional Stay (SKILLED_NURSING_FACILITY): Payer: Medicare Other | Admitting: Internal Medicine

## 2018-09-13 ENCOUNTER — Encounter: Payer: Self-pay | Admitting: Internal Medicine

## 2018-09-13 DIAGNOSIS — I10 Essential (primary) hypertension: Secondary | ICD-10-CM

## 2018-09-13 DIAGNOSIS — E1165 Type 2 diabetes mellitus with hyperglycemia: Secondary | ICD-10-CM | POA: Diagnosis not present

## 2018-09-13 DIAGNOSIS — I5032 Chronic diastolic (congestive) heart failure: Secondary | ICD-10-CM | POA: Diagnosis not present

## 2018-09-13 DIAGNOSIS — I639 Cerebral infarction, unspecified: Secondary | ICD-10-CM

## 2018-09-13 NOTE — Progress Notes (Signed)
Location:    [facility] Room Number: 153/P Place of Service:  SNF [phone]) Provider:  Veleta Miners MD  Asencion Noble, MD  Patient Care Team: Asencion Noble, MD as PCP - General (Internal Medicine) Burnell Blanks, MD as PCP - Cardiology (Cardiology)  Extended Emergency Contact Information Primary Emergency Contact: [place], [place] [address] Johnnette Litter of [place] Phone: [phone] Mobile Phone: [phone] Relation: Daughter Secondary Emergency Contact: [place] of [place] Phone: [phone] Relation: Friend  Code Status:  Full Code Goals of care: Advanced Directive information Advanced Directives 09/13/2018  Does Patient Have a Medical Advance Directive? Yes  Type of Advance Directive (No Data)  Does patient want to make changes to medical advance directive? No - Patient declined  Copy of [place] in Chart? -  Would patient like information on creating a medical advance directive? No - Patient declined     Chief Complaint  Patient presents with   Acute Visit    Hyperglycemia    HPI:  Pt is a 73 y.o. female seen today for an acute visit for Hyperglycemia Her BS are running b/w 300-450 throughout the day  Patient was admitted in the hospital from 03/27-03/31 for acute CVA involving posterior circulation. She is here for therapy  Patient has a history of uncontrolled diabetes, hypertension, CHF, gout seizure disorder, anxiety, GERD  Patient is on Insulin 500 three times a day with her meals. We had her on her Home does here and Her Sugars are running more the 300 She is on Diabetic diet No Chest pain . 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    Arthritis    Bronchitis    CHF (congestive heart failure) (HCC)    Chronic diastolic heart failure ([facility]) 07/13/2015   COPD (chronic  obstructive pulmonary disease) (HCC)    Depression    Diabetes mellitus    DKA (diabetic ketoacidoses) ([facility]) 09/27/2014   GERD (gastroesophageal reflux disease)    Headache    Hyperlipidemia    Hypertension    Hypothyroid 10/31/2014   hypothyroidism    Pneumonia    hx of    PONV (postoperative nausea and vomiting)    and slow to wake up after foot surgery    Seizures ([place])    2017   Shingles    Syncope 10/31/2014   Tubular adenoma of colon    Vertigo 10/31/2014   Past Surgical History:  Procedure Laterality Date   ANKLE SURGERY     x 4   APPENDECTOMY     CATARACT EXTRACTION Bilateral    CATARACT EXTRACTION W/ INTRAOCULAR LENS IMPLANT     CHOLECYSTECTOMY     COLONOSCOPY W/ BIOPSIES AND POLYPECTOMY     ESOPHAGOGASTRODUODENOSCOPY     HARDWARE REMOVAL Left 11/03/2015   Procedure: HARDWARE REMOVAL LEFT FOOT appliction of wound vac;  Surgeon: Gaynelle Arabian, MD;  Location: [facility];  Service: Orthopedics;  Laterality: Left;   INCISION AND DRAINAGE OF WOUND Left 11/03/2015   Procedure: IRRIGATION AND DEBRIDEMENT LEFT FOOT ULCER;  Surgeon: Gaynelle Arabian, MD;  Location: [facility];  Service: Orthopedics;  Laterality: Left;   KNEE ARTHROSCOPY     NASAL SINUS SURGERY     non cancerous mass removed     small intestine   oophrectomy  ORIF CALCANEOUS FRACTURE Left 12/29/2014   Procedure: OPEN REDUCTION INTERNAL FIXATION  LEFT CALCANEOUS FRACTURE;  Surgeon: Gaynelle Arabian, MD;  Location: [facility];  Service: Orthopedics;  Laterality: Left;   TONSILLECTOMY      Allergies  Allergen Reactions   Sulfonamide Derivatives Anaphylaxis   Azithromycin    Bactrim [Sulfamethoxazole-Trimethoprim] Nausea Only   Erythromycin Nausea And Vomiting   Latex Itching and Other (See Comments)    If she wears gloves too long her hands start itching   Mucinex [Guaifenesin Er] Nausea And Vomiting   Prednisone Other (See Comments)    Diabetes   Aspirin Rash   Levofloxacin Rash    Tape Rash    Outpatient Encounter Medications as of 09/13/2018  Medication Sig   acetaminophen (TYLENOL) 325 MG tablet Take 2 tablets (650 mg total) by mouth every 4 (four) hours as needed for mild pain (or temp > 37.5 C (99.5 F)).   albuterol (PROVENTIL HFA;VENTOLIN HFA) 108 (90 Base) MCG/ACT inhaler Inhale 2 puffs into the lungs every 4 (four) hours as needed for wheezing or shortness of breath.    Ascorbic Acid (VITAMIN C PO) Take 2 tablets by mouth daily.   atorvastatin (LIPITOR) 40 MG tablet Take 1 tablet (40 mg total) by mouth daily at 6 PM.   carvedilol (COREG) 6.25 MG tablet Take 6.25 mg by mouth 2 (two) times daily.   clopidogrel (PLAVIX) 75 MG tablet Take 1 tablet (75 mg total) by mouth daily.   colchicine 0.6 MG tablet Take 0.6 mg by mouth daily as needed.   fluticasone (FLONASE) 50 MCG/ACT nasal spray Place 2 sprays into the nose 2 (two) times daily as needed for allergies.    HYDROcodone-acetaminophen (NORCO) 5-325 MG tablet Take 1 tablet by mouth every 8 (eight) hours as needed for up to 14 days for moderate pain.   insulin aspart (NOVOLOG PENFILL) cartridge Amt 6 units; subcutaneous STAT-immediately   insulin aspart (NOVOLOG) 100 UNIT/ML injection Amt: 4 units; subcutaneous Special instructions: only give if CBG is 200-400 give 4 units three times a day   Insulin Regular Human (HUMULIN R U-500 KWIKPEN [place]) 500 unit /ml (3 ml): 10 units subcutaneous Special instructions: If CBG is 20-400 give Novolog 4 units once a day   Insulin Regular Human (HUMULIN R U-500, CONCENTRATED, [place]) 500 unit/ml (39ml): amt 20 units; subcutaneous ;Special instructions: If CBG is 200-400 give Novolog 4 units Twice a day   levETIRAcetam (KEPPRA) 500 MG tablet Take 1 tablet (500 mg total) by mouth 2 (two) times daily.   levothyroxine (SYNTHROID, LEVOTHROID) 125 MCG tablet Take 125 mcg by mouth daily before breakfast.    LORazepam (ATIVAN) 0.5 MG tablet Take 1 tablet (0.5 mg total) by mouth  every 8 (eight) hours as needed for up to 14 days for anxiety.   NON FORMULARY Diet Type:  NAS, Consistent Carbohydrate   pantoprazole (PROTONIX) 20 MG tablet Take 20 mg by mouth daily.   PARoxetine (PAXIL) 40 MG tablet Take 40 mg by mouth daily.   Polyethylene Glycol 400 (BLINK TEARS OP) Place 1 drop into both eyes 4 (four) times daily as needed (Dry eyes).    potassium chloride (K-DUR) 10 MEQ tablet TAKE 1 TABLET BY MOUTH ONCE DAILY   quinapril (ACCUPRIL) 40 MG tablet Take 40 mg by mouth daily.    Senna-Psyllium (PERDIEM PO) Take 1 tablet by mouth daily as needed (For constipation.).    torsemide (DEMADEX) 20 MG tablet Take 20 mg by mouth 2 (two) times  daily.   vitamin B-12 (CYANOCOBALAMIN) 1000 MCG tablet Take 1,000 mcg by mouth daily.   No facility-administered encounter medications on file as of 09/13/2018.      Review of Systems  Constitutional: Positive for activity change.  HENT: Negative.   Respiratory: Negative.   Cardiovascular: Negative.   Gastrointestinal: Negative.   Genitourinary: Negative.   Musculoskeletal: Negative.   Neurological: Positive for weakness.  Psychiatric/Behavioral: Negative.     Immunization History  Administered Date(s) Administered   Influenza Split 03/04/2013   Influenza-Unspecified 04/12/2014, 04/09/2015   Pneumococcal-Unspecified 04/12/2012   Pertinent  Health Maintenance Due  Topic Date Due   URINE MICROALBUMIN  10/29/1955   FOOT EXAM  10/11/2018 (Originally 10/29/1955)   OPHTHALMOLOGY EXAM  10/11/2018 (Originally 10/29/1955)   PNA vac Low Risk Adult (2 of 2 - PCV13) 10/11/2018 (Originally 04/12/2013)   INFLUENZA VACCINE  01/11/2019   HEMOGLOBIN A1C  03/10/2019   MAMMOGRAM  04/28/2019   COLONOSCOPY  01/11/2022   DEXA SCAN  Completed   Fall Risk  06/01/2017  Falls in the past year? No   Functional Status Survey:    Vitals:   09/13/18 1339  BP: (!) 140/51  Pulse: 73  Resp: 20  Temp: 98.2 F (36.8 C)    TempSrc: Oral  Weight: 193 lb 8 oz (87.8 kg)  Height: 5' 8.5" (1.74 m)   Body mass index is 28.99 kg/m. Physical Exam Vitals signs reviewed.  Constitutional:      Appearance: Normal appearance.  HENT:     Head: Normocephalic.     Nose: Nose normal.     Mouth/Throat:     Mouth: Mucous membranes are moist.     Pharynx: Oropharynx is clear.  Eyes:     Pupils: Pupils are equal, round, and reactive to light.  Neck:     Musculoskeletal: Neck supple.  Cardiovascular:     Rate and Rhythm: Normal rate and regular rhythm.     Pulses: Normal pulses.     Heart sounds: Normal heart sounds.  Pulmonary:     Effort: Pulmonary effort is normal.     Breath sounds: Normal breath sounds.  Abdominal:     General: Abdomen is flat. Bowel sounds are normal. There is no distension.     Palpations: Abdomen is soft.     Tenderness: There is no abdominal tenderness. There is no guarding.  Musculoskeletal:     Comments: Mild swelling in her Right LE with discoloration around ankles which patient says is old  Skin:    General: Skin is warm and dry.  Neurological:     Mental Status: She is alert and oriented to person, place, and time.     Comments: Has Mild weakness in Left UE and LE with some incoordination. Also has peripheral loss of vision in Left Eye  Psychiatric:        Mood and Affect: Mood normal.        Thought Content: Thought content normal.     Comments: Patient is very anxious denies suicide ideation right now Does go tangential in talking     Labs reviewed: Recent Labs    09/06/18 1317 09/08/18 0531 09/10/18 0413  NA 137 136 141  K 4.0 3.7 3.5  CL 105 103 106  CO2 19* 22 24  GLUCOSE 370* 343* 152*  BUN 28* 22 31*  CREATININE 1.24* 1.13* 1.11*  CALCIUM 8.9 8.0* 8.1*   No results for input(s): AST, ALT, ALKPHOS, BILITOT, PROT, ALBUMIN in the last  8760 hours. Recent Labs    09/06/18 1317 09/08/18 0531 09/10/18 0413  WBC 7.0 5.5 5.4  NEUTROABS 5.7  --   --   HGB  11.1* 10.4* 10.9*  HCT 32.6* 30.8* 32.9*  MCV 84.5 83.7 85.0  PLT 155 161 160   Lab Results  Component Value Date   TSH 1.655 01/17/2016   Lab Results  Component Value Date   HGBA1C 8.5 (H) 09/07/2018   Lab Results  Component Value Date   CHOL 170 09/07/2018   HDL 38 (L) 09/07/2018   LDLCALC 87 09/07/2018   TRIG 225 (H) 09/07/2018   CHOLHDL 4.5 09/07/2018    Significant Diagnostic Results in last 30 days:  Dg Pelvis 1-2 Views  Result Date: 09/09/2018 CLINICAL DATA:  Left hip pain since fall three days ago. EXAM: PELVIS - 1-2 VIEW COMPARISON:  None. FINDINGS: Mild diffuse osteopenia. Mild symmetric degenerative change of the hips. No acute fracture or dislocation. Mild degenerative change of the spine. IMPRESSION: No acute findings. Mild symmetric degenerative change of the hips.  Osteopenia. Electronically Signed   By: Marin Olp M.D.   On: 09/09/2018 19:41   Ct Head Wo Contrast  Result Date: 09/10/2018 CLINICAL DATA:  Right PCA territory infarct follow-up. EXAM: CT HEAD WITHOUT CONTRAST TECHNIQUE: Contiguous axial images were obtained from the base of the skull through the vertex without intravenous contrast. COMPARISON:  CT head dated September 08, 2018. FINDINGS: Brain: Unchanged cytotoxic edema related to large right PCA territory infarct involving the medial temporal and occipital lobes. Small foci of hemorrhagic transformation are unchanged. Increasing cytotoxic edema in the right thalamus. No evidence of hydrocephalus, extra-axial collection or mass lesion. Vascular: Calcified atherosclerosis at the skullbase. No hyperdense vessel. Skull: Negative for fracture or focal lesion. Sinuses/Orbits: No acute finding. Other: None. IMPRESSION: 1. Unchanged large right PCA territory infarct with small foci of hemorrhagic transformation. 2. Increasing cytotoxic edema in the right thalamus related to underlying infarct. Electronically Signed   By: Titus Dubin M.D.   On: 09/10/2018 10:20    Ct Head Wo Contrast  Result Date: 09/08/2018 CLINICAL DATA:  Worsening symptoms of acute stroke after a fall. Incoordination of LEFT upper and lower extremity. EXAM: CT HEAD WITHOUT CONTRAST TECHNIQUE: Contiguous axial images were obtained from the base of the skull through the vertex without intravenous contrast. COMPARISON:  CT head 09/06/2018. MR head 09/06/2018 FINDINGS: Brain: Increased cytotoxic edema related to the large RIGHT PCA territory infarction due to proximal PCA occlusion, with mild swelling of the RIGHT occipital lobe and posterior temporal lobe. The infarcted brain appears lower in attenuation and increasingly well demarcated from surrounding normal structures. Slight hemorrhagic transformation, most prominent at the RIGHT occipital pole, is better seen, but not appreciably worse. Infarction involving the RIGHT medial temporal lobe does not appear to result in sufficient swelling to compress the brainstem at the midbrain level, nor are there new areas of infarction involving the RIGHT MCA territory. Vascular: No hyperdense vessel or unexpected calcification. Skull: Normal. Negative for fracture or focal lesion. Sinuses/Orbits: No acute finding. Other: None. IMPRESSION: Increasing cytotoxic edema related to the large RIGHT PCA territory infarction. Slight hemorrhagic transformation, most prominent at the RIGHT occipital pole, but not appreciably worse. No new areas of infarction are identified. Electronically Signed   By: Staci Righter M.D.   On: 09/08/2018 12:52   Ct Head Wo Contrast  Result Date: 09/06/2018 CLINICAL DATA:  Syncope with recent fall EXAM: CT HEAD WITHOUT CONTRAST TECHNIQUE: Contiguous axial  images were obtained from the base of the skull through the vertex without intravenous contrast. COMPARISON:  May 30, 2017 FINDINGS: Brain: There is mild diffuse atrophy. There is decreased attenuation throughout much of the right posteromedial temporal and right medial and  posterior occipital lobes. There is relative sparing of the periphery of the right temporal and occipital lobes. There is no demonstrable mass, hemorrhage, extra-axial fluid collection, or midline shift. Brain parenchyma elsewhere appears unremarkable. Vascular: There is no hyperdense vessel appreciable. There is calcification in each carotid siphon region. Skull: Bony calvarium appears intact. Sinuses/Orbits: There is opacification of multiple ethmoid air cells. There is mucosal thickening in the left sphenoid sinus. There is edema in each nasal turbinates. Orbits appear symmetric bilaterally. Other: Mastoid air cells are clear. IMPRESSION: Acute appearing infarct involving a portion of the right posterior cerebral artery distribution. Specifically, there is cytotoxic edema throughout portions of the medial and posterior right temporal and right occipital lobe with sparing of a portion of the periphery of the right occipital lobe. No other acute appearing infarct evident. No mass or hemorrhage. Foci of arterial vascular calcification noted. Areas of paranasal sinus disease noted. Electronically Signed   By: Lowella Grip III M.D.   On: 09/06/2018 14:09   Mr Jodene Nam Head Wo Contrast  Result Date: 09/06/2018 CLINICAL DATA:  Syncopal episode.  Blurry vision. EXAM: MRI HEAD WITHOUT AND WITH CONTRAST AND MRA HEAD WITHOUT CONTRAST AND MRI NECK WITHOUT AND WITH CONTRAST TECHNIQUE: Multiplanar, multiecho pulse sequences of the brain and surrounding structures were obtained without and with intravenous contrast. Angiographic images of the head were obtained using MRA technique without and with contrast. Multiplanar, multiecho pulse sequences of the neck and surrounding structures were obtained without and with intravenous contrast. CONTRAST:  Gadavist 9 mL. COMPARISON:  CT head earlier today. FINDINGS: MRI HEAD FINDINGS Brain: Large acute/early subacute RIGHT PCA territory infarct affects the posterior and medial  temporal lobe, and occipital lobe. Punctate areas of acute infarction affect the RIGHT thalamus. No brainstem or cerebellar involvement. Mild T1 and T2 shortening, gyriform pattern, affecting the RIGHT occipital lobe, suggests early reperfusion hemorrhage. Only slight postcontrast enhancement. No midline shift. Mild cerebral and cerebellar atrophy. Mild subcortical and periventricular T2 and FLAIR hyperintensities, likely chronic microvascular ischemic change. Vascular: Reported separately Skull and upper cervical spine: Unremarkable Sinuses/Orbits: Mucosal thickening.  BILATERAL cataract extraction Other: None MRA HEAD FINDINGS The internal carotid arteries are widely patent. The basilar artery is widely patent with vertebrals codominant although there may be mild nonstenotic irregularity of the distal RIGHT vertebral SP at the vertebrobasilar junction. No proximal stenosis of the middle cerebral arteries. Non dominant LEFT A1 anterior cerebral artery. The RIGHT P1 PCA is completely occluded at its origin. Faint attempts at collateralization are seen from the RIGHT PCOM. No cerebellar branch occlusion. No saccular aneurysm. MRA NECK FINDINGS Conventional branching of the great vessels from the arch. No proximal stenosis. Carotid bifurcations demonstrate no flow reducing lesion. The common carotid arteries and internal carotid arteries are widely patent without evidence of dissection or vasculitis. No fibromuscular change. No visible ostial stenosis of the codominant vertebral arteries. Vertebral arteries appear widely patent throughout the neck into the intracranial segments to the basilar confluence. IMPRESSION: Large acute to early subacute RIGHT PCA territory infarct affecting the posteromedial temporal lobe, occipital lobe, and RIGHT thalamus. 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 No extracranial stenosis, dissection, or occlusion. Electronically Signed   By: Staci Righter M.D.   On: 09/06/2018  17:50   Mr Angiogram Neck W Or Wo Contrast  Result Date: 09/06/2018 CLINICAL DATA:  Syncopal episode.  Blurry vision. EXAM: MRI HEAD WITHOUT AND WITH CONTRAST AND MRA HEAD WITHOUT CONTRAST AND MRI NECK WITHOUT AND WITH CONTRAST TECHNIQUE: Multiplanar, multiecho pulse sequences of the brain and surrounding structures were obtained without and with intravenous contrast. Angiographic images of the head were obtained using MRA technique without and with contrast. Multiplanar, multiecho pulse sequences of the neck and surrounding structures were obtained without and with intravenous contrast. CONTRAST:  Gadavist 9 mL. COMPARISON:  CT head earlier today. FINDINGS: MRI HEAD FINDINGS Brain: Large acute/early subacute RIGHT PCA territory infarct affects the posterior and medial temporal lobe, and occipital lobe. Punctate areas of acute infarction affect the RIGHT thalamus. No brainstem or cerebellar involvement. Mild T1 and T2 shortening, gyriform pattern, affecting the RIGHT occipital lobe, suggests early reperfusion hemorrhage. Only slight postcontrast enhancement. No midline shift. Mild cerebral and cerebellar atrophy. Mild subcortical and periventricular T2 and FLAIR hyperintensities, likely chronic microvascular ischemic change. Vascular: Reported separately Skull and upper cervical spine: Unremarkable Sinuses/Orbits: Mucosal thickening.  BILATERAL cataract extraction Other: None MRA HEAD FINDINGS The internal carotid arteries are widely patent. The basilar artery is widely patent with vertebrals codominant although there may be mild nonstenotic irregularity of the distal RIGHT vertebral SP at the vertebrobasilar junction. No proximal stenosis of the middle cerebral arteries. Non dominant LEFT A1 anterior cerebral artery. The RIGHT P1 PCA is completely occluded at its origin. Faint attempts at collateralization are seen from the RIGHT PCOM. No cerebellar branch occlusion. No saccular aneurysm. MRA NECK FINDINGS  Conventional branching of the great vessels from the arch. No proximal stenosis. Carotid bifurcations demonstrate no flow reducing lesion. The common carotid arteries and internal carotid arteries are widely patent without evidence of dissection or vasculitis. No fibromuscular change. No visible ostial stenosis of the codominant vertebral arteries. Vertebral arteries appear widely patent throughout the neck into the intracranial segments to the basilar confluence. IMPRESSION: Large acute to early subacute RIGHT PCA territory infarct affecting the posteromedial temporal lobe, occipital lobe, and RIGHT thalamus. 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 No extracranial stenosis, dissection, or occlusion. Electronically Signed   By: Staci Righter M.D.   On: 09/06/2018 17:50   Mr Jeri Cos And Wo Contrast  Result Date: 09/06/2018 CLINICAL DATA:  Syncopal episode.  Blurry vision. EXAM: MRI HEAD WITHOUT AND WITH CONTRAST AND MRA HEAD WITHOUT CONTRAST AND MRI NECK WITHOUT AND WITH CONTRAST TECHNIQUE: Multiplanar, multiecho pulse sequences of the brain and surrounding structures were obtained without and with intravenous contrast. Angiographic images of the head were obtained using MRA technique without and with contrast. Multiplanar, multiecho pulse sequences of the neck and surrounding structures were obtained without and with intravenous contrast. CONTRAST:  Gadavist 9 mL. COMPARISON:  CT head earlier today. FINDINGS: MRI HEAD FINDINGS Brain: Large acute/early subacute RIGHT PCA territory infarct affects the posterior and medial temporal lobe, and occipital lobe. Punctate areas of acute infarction affect the RIGHT thalamus. No brainstem or cerebellar involvement. Mild T1 and T2 shortening, gyriform pattern, affecting the RIGHT occipital lobe, suggests early reperfusion hemorrhage. Only slight postcontrast enhancement. No midline shift. Mild cerebral and cerebellar atrophy. Mild subcortical and  periventricular T2 and FLAIR hyperintensities, likely chronic microvascular ischemic change. Vascular: Reported separately Skull and upper cervical spine: Unremarkable Sinuses/Orbits: Mucosal thickening.  BILATERAL cataract extraction Other: None MRA HEAD FINDINGS The internal carotid arteries are widely patent. The basilar artery is widely patent  with vertebrals codominant although there may be mild nonstenotic irregularity of the distal RIGHT vertebral SP at the vertebrobasilar junction. No proximal stenosis of the middle cerebral arteries. Non dominant LEFT A1 anterior cerebral artery. The RIGHT P1 PCA is completely occluded at its origin. Faint attempts at collateralization are seen from the RIGHT PCOM. No cerebellar branch occlusion. No saccular aneurysm. MRA NECK FINDINGS Conventional branching of the great vessels from the arch. No proximal stenosis. Carotid bifurcations demonstrate no flow reducing lesion. The common carotid arteries and internal carotid arteries are widely patent without evidence of dissection or vasculitis. No fibromuscular change. No visible ostial stenosis of the codominant vertebral arteries. Vertebral arteries appear widely patent throughout the neck into the intracranial segments to the basilar confluence. IMPRESSION: Large acute to early subacute RIGHT PCA territory infarct affecting the posteromedial temporal lobe, occipital lobe, and RIGHT thalamus. 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 No extracranial stenosis, dissection, or occlusion. Electronically Signed   By: Staci Righter M.D.   On: 09/06/2018 17:50    Assessment/Plan Uncontrolled Diabetes Mellitus I had a long discussion with the patient and she has agreed now to try long-acting insulin while she is in our facility.  I told her it is hard for me to control her sugars with just boluses with her meals. We will start her on Lantus 20 units every morning Continue NovoLog bolus according to the sliding  scale q. before meals and nightly Will adjust her Lantus accordingly  Other issues are stable Acute ischemic stroke  Started on Plavix in the hospital Event monitor from Cardiology Follow up with [facility] as she would not  follow with [facility] BP midily high Will continue to monitor ON statin Chronic diastolic CHF (congestive heart failure)  Echo showed EF of 60% On Toresimide monitor weights  Seizure  Continue Keppra  Hyperlipidemia, Started on Statin LDL was 87 in  hospital  Depression with anxiety  on ativan PRN for 2 weeks Already on Paxil Hypothyroid Repeat TSH Continue supplement Hypertension On Coreg and Norvasc will continue to monitor       Family/ staff Communication:   Labs/tests ordered:   Total time spent in this patient care encounter was 25_ minutes; greater than 50% of the visit spent counseling patient, reviewing records , Labs and coordinating care for problems addressed at this encounter.

## 2018-09-19 ENCOUNTER — Telehealth: Payer: Self-pay

## 2018-09-19 NOTE — Telephone Encounter (Signed)
Jaclyn Herrera notified that monitor has been resent to [place] [facility]

## 2018-09-19 NOTE — Telephone Encounter (Signed)
Received telephone call from [facility] in regards to phone note. They need to know when the patient will receive the monitor. Please call [phone] (ask for Janett Billow or Izora Gala)  Karen Kays, Please set this inpatient up with a 30 day event monitor as part of her stroke work up.  She is going to discharge to a skilled nursing facility.  Thanks for all of your help.   Murvin Natal, MD

## 2018-09-23 ENCOUNTER — Encounter: Payer: Self-pay | Admitting: Internal Medicine

## 2018-09-23 ENCOUNTER — Non-Acute Institutional Stay (SKILLED_NURSING_FACILITY): Payer: Medicare Other | Admitting: Internal Medicine

## 2018-09-23 DIAGNOSIS — F329 Major depressive disorder, single episode, unspecified: Secondary | ICD-10-CM

## 2018-09-23 DIAGNOSIS — I639 Cerebral infarction, unspecified: Secondary | ICD-10-CM

## 2018-09-23 DIAGNOSIS — I10 Essential (primary) hypertension: Secondary | ICD-10-CM

## 2018-09-23 DIAGNOSIS — F32A Depression, unspecified: Secondary | ICD-10-CM

## 2018-09-23 DIAGNOSIS — I5032 Chronic diastolic (congestive) heart failure: Secondary | ICD-10-CM

## 2018-09-23 NOTE — Progress Notes (Signed)
Location:    [facility] Room Number: 153/P Place of Service:  SNF [phone]) Provider: Veleta Miners MD  Asencion Noble, MD  Patient Care Team: Asencion Noble, MD as PCP - General (Internal Medicine) Burnell Blanks, MD as PCP - Cardiology (Cardiology)  Extended Emergency Contact Information Primary Emergency Contact: [place], [place] [address] Johnnette Litter of [place] Phone: [phone] Mobile Phone: [phone] Relation: Daughter Secondary Emergency Contact: [place] of [place] Phone: [phone] Relation: Friend  Code Status:  Full Code Goals of care: Advanced Directive information Advanced Directives 09/23/2018  Does Patient Have a Medical Advance Directive? Yes  Type of Advance Directive (No Data)  Does patient want to make changes to medical advance directive? No - Patient declined  Copy of [place] in Chart? -  Would patient like information on creating a medical advance directive? No - Patient declined     Chief Complaint  Patient presents with   Acute Visit    Hyperglycemia    HPI:  Pt is a 73 y.o. female seen today for an acute visit for Hyperglycemia and Anxiety  Patient was admitted in the hospital from03/27-03/87for acute CVA involving posterior circulation. She is here for therapy  Patient has a history of uncontrolled diabetes, hypertension, CHF, gout seizure disorder, anxiety, GERD  Patient is on [place] and her BS are running more then 400 in afternoon. She continues to get coverage with Novolog Boluses She also wanted to see me today as she is very upset and she was crying . She thinks she is  not getting better and cannot go home by herself. And she is upset about that. 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     Arthritis    Bronchitis    CHF (congestive heart failure) (HCC)    Chronic diastolic heart failure ([place]) 07/13/2015   COPD (chronic obstructive pulmonary disease) (HCC)    Depression    Diabetes mellitus    DKA (diabetic ketoacidoses) ([place]) 09/27/2014   GERD (gastroesophageal reflux disease)    Headache    Hyperlipidemia    Hypertension    Hypothyroid 10/31/2014   hypothyroidism    Pneumonia    hx of    PONV (postoperative nausea and vomiting)    and slow to wake up after foot surgery    Seizures ([place])    2017   Shingles    Syncope 10/31/2014   Tubular adenoma of colon    Vertigo 10/31/2014   Past Surgical History:  Procedure Laterality Date   ANKLE SURGERY     x 4   APPENDECTOMY     CATARACT EXTRACTION Bilateral    CATARACT EXTRACTION W/ INTRAOCULAR LENS IMPLANT     CHOLECYSTECTOMY     COLONOSCOPY W/ BIOPSIES AND POLYPECTOMY     ESOPHAGOGASTRODUODENOSCOPY     HARDWARE REMOVAL Left 11/03/2015   Procedure: HARDWARE REMOVAL LEFT FOOT appliction of wound vac;  Surgeon: Gaynelle Arabian, MD;  Location: [facility];  Service: Orthopedics;  Laterality: Left;   INCISION AND DRAINAGE OF WOUND Left 11/03/2015   Procedure: IRRIGATION AND DEBRIDEMENT LEFT FOOT ULCER;  Surgeon: Gaynelle Arabian, MD;  Location: [facility];  Service: Orthopedics;  Laterality: Left;   KNEE ARTHROSCOPY  NASAL SINUS SURGERY     non cancerous mass removed     small intestine   oophrectomy     ORIF CALCANEOUS FRACTURE Left 12/29/2014   Procedure: OPEN REDUCTION INTERNAL FIXATION  LEFT CALCANEOUS FRACTURE;  Surgeon: Gaynelle Arabian, MD;  Location: [facility];  Service: Orthopedics;  Laterality: Left;   TONSILLECTOMY      Allergies  Allergen Reactions   Sulfonamide Derivatives Anaphylaxis   Azithromycin    Bactrim [Sulfamethoxazole-Trimethoprim] Nausea Only   Erythromycin Nausea And Vomiting   Latex Itching and Other (See Comments)    If she wears gloves too long her hands start  itching   Mucinex [Guaifenesin Er] Nausea And Vomiting   Prednisone Other (See Comments)    Diabetes   Aspirin Rash   Levofloxacin Rash   Tape Rash    Outpatient Encounter Medications as of 09/23/2018  Medication Sig   acetaminophen (TYLENOL) 325 MG tablet Take 2 tablets (650 mg total) by mouth every 4 (four) hours as needed for mild pain (or temp > 37.5 C (99.5 F)).   albuterol (PROVENTIL HFA;VENTOLIN HFA) 108 (90 Base) MCG/ACT inhaler Inhale 2 puffs into the lungs every 4 (four) hours as needed for wheezing or shortness of breath.    Ascorbic Acid (VITAMIN C PO) Take 2 tablets by mouth daily.   atorvastatin (LIPITOR) 40 MG tablet Take 1 tablet (40 mg total) by mouth daily at 6 PM.   carvedilol (COREG) 6.25 MG tablet Take 6.25 mg by mouth 2 (two) times daily.   clopidogrel (PLAVIX) 75 MG tablet Take 1 tablet (75 mg total) by mouth daily.   colchicine 0.6 MG tablet Take 0.6 mg by mouth daily as needed.   fluticasone (FLONASE) 50 MCG/ACT nasal spray Place 2 sprays into the nose 2 (two) times daily as needed for allergies.    HYDROcodone-acetaminophen (NORCO) 5-325 MG tablet Take 1 tablet by mouth every 8 (eight) hours as needed for up to 14 days for moderate pain.   insulin aspart (NOVOLOG) 100 UNIT/ML injection Give per sliding scale 4-12 if blood sugar 201 to 250 give 4 units, if blood sugar is 251-300 give 6 units, If blood sugar is 301- to 350 give 8 units, if blood sugar is 351 to 400 give 10 units, if blood sugar is 401-450 give 12 units, if blood sugar is greater than 450 call MD.   Insulin Glargine (LANTUS SOLOSTAR) 100 UNIT/ML Solostar Pen Inject 25 Units into the skin daily.   levETIRAcetam (KEPPRA) 500 MG tablet Take 1 tablet (500 mg total) by mouth 2 (two) times daily.   levothyroxine (SYNTHROID, LEVOTHROID) 125 MCG tablet Take 125 mcg by mouth daily before breakfast.    LORazepam (ATIVAN) 0.5 MG tablet Take 1 tablet (0.5 mg total) by mouth every 8 (eight) hours  as needed for up to 14 days for anxiety.   NON FORMULARY Diet Type:  NAS, Consistent Carbohydrate   pantoprazole (PROTONIX) 20 MG tablet Take 20 mg by mouth daily.   PARoxetine (PAXIL) 40 MG tablet Take 40 mg by mouth daily.   Polyethylene Glycol 400 (BLINK TEARS OP) Place 1 drop into both eyes 4 (four) times daily as needed (Dry eyes).    potassium chloride (K-DUR) 10 MEQ tablet TAKE 1 TABLET BY MOUTH ONCE DAILY   quinapril (ACCUPRIL) 40 MG tablet Take 40 mg by mouth daily.    Senna-Psyllium (PERDIEM PO) Take 1 tablet by mouth daily as needed (For constipation.).    torsemide (DEMADEX) 20 MG tablet  Take 20 mg by mouth 2 (two) times daily.   vitamin B-12 (CYANOCOBALAMIN) 1000 MCG tablet Take 1,000 mcg by mouth daily.   [DISCONTINUED] insulin aspart (NOVOLOG PENFILL) cartridge Amt 6 units; subcutaneous STAT-immediately   [DISCONTINUED] Insulin Regular Human (HUMULIN R U-500 KWIKPEN [place]) 500 unit /ml (3 ml): 10 units subcutaneous Special instructions: If CBG is 20-400 give Novolog 4 units once a day   [DISCONTINUED] Insulin Regular Human (HUMULIN R U-500, CONCENTRATED, [place]) 500 unit/ml (74ml): amt 20 units; subcutaneous ;Special instructions: If CBG is 200-400 give Novolog 4 units Twice a day   No facility-administered encounter medications on file as of 09/23/2018.      Review of Systems  Constitutional: Positive for activity change.  HENT: Negative.   Respiratory: Negative.   Cardiovascular: Negative.   Gastrointestinal: Negative.   Genitourinary: Negative.   Musculoskeletal: Positive for arthralgias.  Skin: Negative.   Neurological: Positive for weakness.  Psychiatric/Behavioral: Positive for dysphoric mood. Negative for behavioral problems. The patient is nervous/anxious.     Immunization History  Administered Date(s) Administered   Influenza Split 03/04/2013   Influenza-Unspecified 04/12/2014, 04/09/2015   Pneumococcal-Unspecified 04/12/2012   Pertinent  Health  Maintenance Due  Topic Date Due   FOOT EXAM  10/11/2018 (Originally 10/29/1955)   OPHTHALMOLOGY EXAM  10/11/2018 (Originally 10/29/1955)   PNA vac Low Risk Adult (2 of 2 - PCV13) 10/11/2018 (Originally 04/12/2013)   URINE MICROALBUMIN  10/23/2018 (Originally 10/29/1955)   INFLUENZA VACCINE  01/11/2019   HEMOGLOBIN A1C  03/10/2019   MAMMOGRAM  04/28/2019   COLONOSCOPY  01/11/2022   DEXA SCAN  Completed   Fall Risk  06/01/2017  Falls in the past year? No   Functional Status Survey:    Vitals:   09/23/18 1639  BP: (!) 141/70  Pulse: 78  Resp: 20  Temp: 97.8 F (36.6 C)   There is no height or weight on file to calculate BMI. Physical Exam Vitals signs reviewed.  Constitutional:      Appearance: Normal appearance.     Comments: Teary and upset  HENT:     Head: Normocephalic.     Nose: Nose normal.     Mouth/Throat:     Mouth: Mucous membranes are moist.     Pharynx: Oropharynx is clear.  Eyes:     Pupils: Pupils are equal, round, and reactive to light.  Neck:     Musculoskeletal: Neck supple.  Cardiovascular:     Rate and Rhythm: Normal rate and regular rhythm.     Pulses: Normal pulses.     Heart sounds: Normal heart sounds.  Pulmonary:     Effort: Pulmonary effort is normal. No respiratory distress.     Breath sounds: Normal breath sounds. No wheezing or rales.  Abdominal:     General: Abdomen is flat. Bowel sounds are normal. There is no distension.     Palpations: Abdomen is soft.     Tenderness: There is no abdominal tenderness. There is no guarding.  Musculoskeletal:     Comments: Mild swelling in her Right LE with discoloration around ankles which patient says is old  Skin:    General: Skin is warm and dry.  Neurological:     Mental Status: She is alert and oriented to person, place, and time.     Comments: Has Mild weakness in Left UE and LE with some incoordination. Also has peripheral loss of vision in Left Eye  Psychiatric:        Attention and  Perception:  She is inattentive.        Mood and Affect: Mood is anxious and depressed.        Speech: Speech is rapid and pressured.     Comments: Patient is very anxious denies suicide ideation right now But very teary. Very Anxious       Labs reviewed: Recent Labs    09/06/18 1317 09/08/18 0531 09/10/18 0413  NA 137 136 141  K 4.0 3.7 3.5  CL 105 103 106  CO2 19* 22 24  GLUCOSE 370* 343* 152*  BUN 28* 22 31*  CREATININE 1.24* 1.13* 1.11*  CALCIUM 8.9 8.0* 8.1*   No results for input(s): AST, ALT, ALKPHOS, BILITOT, PROT, ALBUMIN in the last 8760 hours. Recent Labs    09/06/18 1317 09/08/18 0531 09/10/18 0413  WBC 7.0 5.5 5.4  NEUTROABS 5.7  --   --   HGB 11.1* 10.4* 10.9*  HCT 32.6* 30.8* 32.9*  MCV 84.5 83.7 85.0  PLT 155 161 160   Lab Results  Component Value Date   TSH 1.655 01/17/2016   Lab Results  Component Value Date   HGBA1C 8.5 (H) 09/07/2018   Lab Results  Component Value Date   CHOL 170 09/07/2018   HDL 38 (L) 09/07/2018   LDLCALC 87 09/07/2018   TRIG 225 (H) 09/07/2018   CHOLHDL 4.5 09/07/2018    Significant Diagnostic Results in last 30 days:  Dg Pelvis 1-2 Views  Result Date: 09/09/2018 CLINICAL DATA:  Left hip pain since fall three days ago. EXAM: PELVIS - 1-2 VIEW COMPARISON:  None. FINDINGS: Mild diffuse osteopenia. Mild symmetric degenerative change of the hips. No acute fracture or dislocation. Mild degenerative change of the spine. IMPRESSION: No acute findings. Mild symmetric degenerative change of the hips.  Osteopenia. Electronically Signed   By: Marin Olp M.D.   On: 09/09/2018 19:41   Ct Head Wo Contrast  Result Date: 09/10/2018 CLINICAL DATA:  Right PCA territory infarct follow-up. EXAM: CT HEAD WITHOUT CONTRAST TECHNIQUE: Contiguous axial images were obtained from the base of the skull through the vertex without intravenous contrast. COMPARISON:  CT head dated September 08, 2018. FINDINGS: Brain: Unchanged cytotoxic edema related  to large right PCA territory infarct involving the medial temporal and occipital lobes. Small foci of hemorrhagic transformation are unchanged. Increasing cytotoxic edema in the right thalamus. No evidence of hydrocephalus, extra-axial collection or mass lesion. Vascular: Calcified atherosclerosis at the skullbase. No hyperdense vessel. Skull: Negative for fracture or focal lesion. Sinuses/Orbits: No acute finding. Other: None. IMPRESSION: 1. Unchanged large right PCA territory infarct with small foci of hemorrhagic transformation. 2. Increasing cytotoxic edema in the right thalamus related to underlying infarct. Electronically Signed   By: Titus Dubin M.D.   On: 09/10/2018 10:20   Ct Head Wo Contrast  Result Date: 09/08/2018 CLINICAL DATA:  Worsening symptoms of acute stroke after a fall. Incoordination of LEFT upper and lower extremity. EXAM: CT HEAD WITHOUT CONTRAST TECHNIQUE: Contiguous axial images were obtained from the base of the skull through the vertex without intravenous contrast. COMPARISON:  CT head 09/06/2018. MR head 09/06/2018 FINDINGS: Brain: Increased cytotoxic edema related to the large RIGHT PCA territory infarction due to proximal PCA occlusion, with mild swelling of the RIGHT occipital lobe and posterior temporal lobe. The infarcted brain appears lower in attenuation and increasingly well demarcated from surrounding normal structures. Slight hemorrhagic transformation, most prominent at the RIGHT occipital pole, is better seen, but not appreciably worse. Infarction involving the RIGHT medial  temporal lobe does not appear to result in sufficient swelling to compress the brainstem at the midbrain level, nor are there new areas of infarction involving the RIGHT MCA territory. Vascular: No hyperdense vessel or unexpected calcification. Skull: Normal. Negative for fracture or focal lesion. Sinuses/Orbits: No acute finding. Other: None. IMPRESSION: Increasing cytotoxic edema related to the  large RIGHT PCA territory infarction. Slight hemorrhagic transformation, most prominent at the RIGHT occipital pole, but not appreciably worse. No new areas of infarction are identified. Electronically Signed   By: Staci Righter M.D.   On: 09/08/2018 12:52   Ct Head Wo Contrast  Result Date: 09/06/2018 CLINICAL DATA:  Syncope with recent fall EXAM: CT HEAD WITHOUT CONTRAST TECHNIQUE: Contiguous axial images were obtained from the base of the skull through the vertex without intravenous contrast. COMPARISON:  May 30, 2017 FINDINGS: Brain: There is mild diffuse atrophy. There is decreased attenuation throughout much of the right posteromedial temporal and right medial and posterior occipital lobes. There is relative sparing of the periphery of the right temporal and occipital lobes. There is no demonstrable mass, hemorrhage, extra-axial fluid collection, or midline shift. Brain parenchyma elsewhere appears unremarkable. Vascular: There is no hyperdense vessel appreciable. There is calcification in each carotid siphon region. Skull: Bony calvarium appears intact. Sinuses/Orbits: There is opacification of multiple ethmoid air cells. There is mucosal thickening in the left sphenoid sinus. There is edema in each nasal turbinates. Orbits appear symmetric bilaterally. Other: Mastoid air cells are clear. IMPRESSION: Acute appearing infarct involving a portion of the right posterior cerebral artery distribution. Specifically, there is cytotoxic edema throughout portions of the medial and posterior right temporal and right occipital lobe with sparing of a portion of the periphery of the right occipital lobe. No other acute appearing infarct evident. No mass or hemorrhage. Foci of arterial vascular calcification noted. Areas of paranasal sinus disease noted. Electronically Signed   By: Lowella Grip III M.D.   On: 09/06/2018 14:09   Mr Jodene Nam Head Wo Contrast  Result Date: 09/06/2018 CLINICAL DATA:  Syncopal  episode.  Blurry vision. EXAM: MRI HEAD WITHOUT AND WITH CONTRAST AND MRA HEAD WITHOUT CONTRAST AND MRI NECK WITHOUT AND WITH CONTRAST TECHNIQUE: Multiplanar, multiecho pulse sequences of the brain and surrounding structures were obtained without and with intravenous contrast. Angiographic images of the head were obtained using MRA technique without and with contrast. Multiplanar, multiecho pulse sequences of the neck and surrounding structures were obtained without and with intravenous contrast. CONTRAST:  Gadavist 9 mL. COMPARISON:  CT head earlier today. FINDINGS: MRI HEAD FINDINGS Brain: Large acute/early subacute RIGHT PCA territory infarct affects the posterior and medial temporal lobe, and occipital lobe. Punctate areas of acute infarction affect the RIGHT thalamus. No brainstem or cerebellar involvement. Mild T1 and T2 shortening, gyriform pattern, affecting the RIGHT occipital lobe, suggests early reperfusion hemorrhage. Only slight postcontrast enhancement. No midline shift. Mild cerebral and cerebellar atrophy. Mild subcortical and periventricular T2 and FLAIR hyperintensities, likely chronic microvascular ischemic change. Vascular: Reported separately Skull and upper cervical spine: Unremarkable Sinuses/Orbits: Mucosal thickening.  BILATERAL cataract extraction Other: None MRA HEAD FINDINGS The internal carotid arteries are widely patent. The basilar artery is widely patent with vertebrals codominant although there may be mild nonstenotic irregularity of the distal RIGHT vertebral SP at the vertebrobasilar junction. No proximal stenosis of the middle cerebral arteries. Non dominant LEFT A1 anterior cerebral artery. The RIGHT P1 PCA is completely occluded at its origin. Faint attempts at collateralization are seen from the  RIGHT PCOM. No cerebellar branch occlusion. No saccular aneurysm. MRA NECK FINDINGS Conventional branching of the great vessels from the arch. No proximal stenosis. Carotid bifurcations  demonstrate no flow reducing lesion. The common carotid arteries and internal carotid arteries are widely patent without evidence of dissection or vasculitis. No fibromuscular change. No visible ostial stenosis of the codominant vertebral arteries. Vertebral arteries appear widely patent throughout the neck into the intracranial segments to the basilar confluence. IMPRESSION: Large acute to early subacute RIGHT PCA territory infarct affecting the posteromedial temporal lobe, occipital lobe, and RIGHT thalamus. 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 No extracranial stenosis, dissection, or occlusion. Electronically Signed   By: Staci Righter M.D.   On: 09/06/2018 17:50   Mr Angiogram Neck W Or Wo Contrast  Result Date: 09/06/2018 CLINICAL DATA:  Syncopal episode.  Blurry vision. EXAM: MRI HEAD WITHOUT AND WITH CONTRAST AND MRA HEAD WITHOUT CONTRAST AND MRI NECK WITHOUT AND WITH CONTRAST TECHNIQUE: Multiplanar, multiecho pulse sequences of the brain and surrounding structures were obtained without and with intravenous contrast. Angiographic images of the head were obtained using MRA technique without and with contrast. Multiplanar, multiecho pulse sequences of the neck and surrounding structures were obtained without and with intravenous contrast. CONTRAST:  Gadavist 9 mL. COMPARISON:  CT head earlier today. FINDINGS: MRI HEAD FINDINGS Brain: Large acute/early subacute RIGHT PCA territory infarct affects the posterior and medial temporal lobe, and occipital lobe. Punctate areas of acute infarction affect the RIGHT thalamus. No brainstem or cerebellar involvement. Mild T1 and T2 shortening, gyriform pattern, affecting the RIGHT occipital lobe, suggests early reperfusion hemorrhage. Only slight postcontrast enhancement. No midline shift. Mild cerebral and cerebellar atrophy. Mild subcortical and periventricular T2 and FLAIR hyperintensities, likely chronic microvascular ischemic change. Vascular:  Reported separately Skull and upper cervical spine: Unremarkable Sinuses/Orbits: Mucosal thickening.  BILATERAL cataract extraction Other: None MRA HEAD FINDINGS The internal carotid arteries are widely patent. The basilar artery is widely patent with vertebrals codominant although there may be mild nonstenotic irregularity of the distal RIGHT vertebral SP at the vertebrobasilar junction. No proximal stenosis of the middle cerebral arteries. Non dominant LEFT A1 anterior cerebral artery. The RIGHT P1 PCA is completely occluded at its origin. Faint attempts at collateralization are seen from the RIGHT PCOM. No cerebellar branch occlusion. No saccular aneurysm. MRA NECK FINDINGS Conventional branching of the great vessels from the arch. No proximal stenosis. Carotid bifurcations demonstrate no flow reducing lesion. The common carotid arteries and internal carotid arteries are widely patent without evidence of dissection or vasculitis. No fibromuscular change. No visible ostial stenosis of the codominant vertebral arteries. Vertebral arteries appear widely patent throughout the neck into the intracranial segments to the basilar confluence. IMPRESSION: Large acute to early subacute RIGHT PCA territory infarct affecting the posteromedial temporal lobe, occipital lobe, and RIGHT thalamus. 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 No extracranial stenosis, dissection, or occlusion. Electronically Signed   By: Staci Righter M.D.   On: 09/06/2018 17:50   Mr Jeri Cos And Wo Contrast  Result Date: 09/06/2018 CLINICAL DATA:  Syncopal episode.  Blurry vision. EXAM: MRI HEAD WITHOUT AND WITH CONTRAST AND MRA HEAD WITHOUT CONTRAST AND MRI NECK WITHOUT AND WITH CONTRAST TECHNIQUE: Multiplanar, multiecho pulse sequences of the brain and surrounding structures were obtained without and with intravenous contrast. Angiographic images of the head were obtained using MRA technique without and with contrast. Multiplanar,  multiecho pulse sequences of the neck and surrounding structures were obtained without and with intravenous  contrast. CONTRAST:  Gadavist 9 mL. COMPARISON:  CT head earlier today. FINDINGS: MRI HEAD FINDINGS Brain: Large acute/early subacute RIGHT PCA territory infarct affects the posterior and medial temporal lobe, and occipital lobe. Punctate areas of acute infarction affect the RIGHT thalamus. No brainstem or cerebellar involvement. Mild T1 and T2 shortening, gyriform pattern, affecting the RIGHT occipital lobe, suggests early reperfusion hemorrhage. Only slight postcontrast enhancement. No midline shift. Mild cerebral and cerebellar atrophy. Mild subcortical and periventricular T2 and FLAIR hyperintensities, likely chronic microvascular ischemic change. Vascular: Reported separately Skull and upper cervical spine: Unremarkable Sinuses/Orbits: Mucosal thickening.  BILATERAL cataract extraction Other: None MRA HEAD FINDINGS The internal carotid arteries are widely patent. The basilar artery is widely patent with vertebrals codominant although there may be mild nonstenotic irregularity of the distal RIGHT vertebral SP at the vertebrobasilar junction. No proximal stenosis of the middle cerebral arteries. Non dominant LEFT A1 anterior cerebral artery. The RIGHT P1 PCA is completely occluded at its origin. Faint attempts at collateralization are seen from the RIGHT PCOM. No cerebellar branch occlusion. No saccular aneurysm. MRA NECK FINDINGS Conventional branching of the great vessels from the arch. No proximal stenosis. Carotid bifurcations demonstrate no flow reducing lesion. The common carotid arteries and internal carotid arteries are widely patent without evidence of dissection or vasculitis. No fibromuscular change. No visible ostial stenosis of the codominant vertebral arteries. Vertebral arteries appear widely patent throughout the neck into the intracranial segments to the basilar confluence. IMPRESSION:  Large acute to early subacute RIGHT PCA territory infarct affecting the posteromedial temporal lobe, occipital lobe, and RIGHT thalamus. 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 No extracranial stenosis, dissection, or occlusion. Electronically Signed   By: Staci Righter M.D.   On: 09/06/2018 17:50    Assessment/Plan Uncontrolled Diabetes Mellitus Will increase her Lantus to 30 units Continue Novolog Sliding scale for now  Depression with anxiety D/w Social worker They have explained her that she is not candidate for Inpatient Rehab. She told them she wants to die then live like this Will get Urgent Psych consult On Paxil and Ativan PRN  Acute ischemic stroke Started on Plavix in the hospital Event monitor from Cardiology pending Follow up with [facility] as she would notfollow with [facility] BP midily high Will continue to monitor ON statin Chronic diastolic CHF (congestive heart failure) Echo showed EF of 60% On Toresimide  Seizure ContinueKeppra  Hyperlipidemia, Started on Statin LDL was 87 in hospital  Hypothyroid Repeat TSH Continue supplement Hypertension On Coreg and Norvasc will continue to monitor   Family/ staff Communication:   Labs/tests ordered:  Labs Pending Total time spent in this patient care encounter was  25_  minutes; greater than 50% of the visit spent counseling patient and staff, reviewing records , Labs and coordinating care for problems addressed at this encounter.

## 2018-09-24 ENCOUNTER — Encounter: Payer: Self-pay | Admitting: Adult Health

## 2018-09-24 ENCOUNTER — Non-Acute Institutional Stay (SKILLED_NURSING_FACILITY): Payer: Medicare Other | Admitting: Adult Health

## 2018-09-24 ENCOUNTER — Inpatient Hospital Stay (INDEPENDENT_AMBULATORY_CARE_PROVIDER_SITE_OTHER): Payer: Medicare Other

## 2018-09-24 ENCOUNTER — Encounter (HOSPITAL_COMMUNITY)
Admission: RE | Admit: 2018-09-24 | Discharge: 2018-09-24 | Disposition: A | Discharge status: Home / Self Care | Payer: Medicare Other | Source: Skilled Nursing Facility | Attending: Internal Medicine | Admitting: Internal Medicine

## 2018-09-24 DIAGNOSIS — I10 Essential (primary) hypertension: Secondary | ICD-10-CM | POA: Insufficient documentation

## 2018-09-24 DIAGNOSIS — F329 Major depressive disorder, single episode, unspecified: Secondary | ICD-10-CM | POA: Insufficient documentation

## 2018-09-24 DIAGNOSIS — I480 Paroxysmal atrial fibrillation: Secondary | ICD-10-CM | POA: Diagnosis not present

## 2018-09-24 DIAGNOSIS — E1169 Type 2 diabetes mellitus with other specified complication: Secondary | ICD-10-CM | POA: Insufficient documentation

## 2018-09-24 DIAGNOSIS — F419 Anxiety disorder, unspecified: Secondary | ICD-10-CM | POA: Insufficient documentation

## 2018-09-24 DIAGNOSIS — I5032 Chronic diastolic (congestive) heart failure: Secondary | ICD-10-CM | POA: Diagnosis not present

## 2018-09-24 DIAGNOSIS — I63331 Cerebral infarction due to thrombosis of right posterior cerebral artery: Secondary | ICD-10-CM | POA: Insufficient documentation

## 2018-09-24 DIAGNOSIS — J3089 Other allergic rhinitis: Secondary | ICD-10-CM | POA: Diagnosis not present

## 2018-09-24 DIAGNOSIS — G40109 Localization-related (focal) (partial) symptomatic epilepsy and epileptic syndromes with simple partial seizures, not intractable, without status epilepticus: Secondary | ICD-10-CM

## 2018-09-24 DIAGNOSIS — E1149 Type 2 diabetes mellitus with other diabetic neurological complication: Secondary | ICD-10-CM

## 2018-09-24 DIAGNOSIS — F339 Major depressive disorder, recurrent, unspecified: Secondary | ICD-10-CM

## 2018-09-24 DIAGNOSIS — I639 Cerebral infarction, unspecified: Secondary | ICD-10-CM

## 2018-09-24 DIAGNOSIS — E785 Hyperlipidemia, unspecified: Secondary | ICD-10-CM

## 2018-09-24 DIAGNOSIS — E1159 Type 2 diabetes mellitus with other circulatory complications: Secondary | ICD-10-CM | POA: Diagnosis not present

## 2018-09-24 DIAGNOSIS — E1165 Type 2 diabetes mellitus with hyperglycemia: Secondary | ICD-10-CM

## 2018-09-24 DIAGNOSIS — D638 Anemia in other chronic diseases classified elsewhere: Secondary | ICD-10-CM | POA: Insufficient documentation

## 2018-09-24 DIAGNOSIS — E039 Hypothyroidism, unspecified: Secondary | ICD-10-CM

## 2018-09-24 DIAGNOSIS — IMO0002 Reserved for concepts with insufficient information to code with codable children: Secondary | ICD-10-CM | POA: Insufficient documentation

## 2018-09-24 DIAGNOSIS — K219 Gastro-esophageal reflux disease without esophagitis: Secondary | ICD-10-CM

## 2018-09-24 LAB — COMPREHENSIVE METABOLIC PANEL
ALT: 10 U/L (ref 0–44)
AST: 13 U/L — ABNORMAL LOW (ref 15–41)
Albumin: 3.2 g/dL — ABNORMAL LOW (ref 3.5–5.0)
Alkaline Phosphatase: 156 U/L — ABNORMAL HIGH (ref 38–126)
Anion gap: 9 (ref 5–15)
BUN: 33 mg/dL — ABNORMAL HIGH (ref 8–23)
CO2: 27 mmol/L (ref 22–32)
Calcium: 8.4 mg/dL — ABNORMAL LOW (ref 8.9–10.3)
Chloride: 100 mmol/L (ref 98–111)
Creatinine, Ser: 1.17 mg/dL — ABNORMAL HIGH (ref 0.44–1.00)
GFR calc Af Amer: 54 mL/min — ABNORMAL LOW (ref >60)
GFR calc non Af Amer: 46 mL/min — ABNORMAL LOW (ref >60)
Glucose, Bld: 378 mg/dL — ABNORMAL HIGH (ref 70–99)
Potassium: 3.9 mmol/L (ref 3.5–5.1)
Sodium: 136 mmol/L (ref 135–145)
Total Bilirubin: 1 mg/dL (ref 0.3–1.2)
Total Protein: 5.9 g/dL — ABNORMAL LOW (ref 6.5–8.1)

## 2018-09-24 LAB — CBC WITH DIFFERENTIAL/PLATELET
Abs Immature Granulocytes: 0.06 10*3/uL (ref 0.00–0.07)
Basophils Absolute: 0 10*3/uL (ref 0.0–0.1)
Basophils Relative: 0 %
Eosinophils Absolute: 0.1 10*3/uL (ref 0.0–0.5)
Eosinophils Relative: 2 %
HCT: 29.1 % — ABNORMAL LOW (ref 36.0–46.0)
Hemoglobin: 9.7 g/dL — ABNORMAL LOW (ref 12.0–15.0)
Immature Granulocytes: 1 %
Lymphocytes Relative: 16 %
Lymphs Abs: 1.1 10*3/uL (ref 0.7–4.0)
MCH: 27.8 pg (ref 26.0–34.0)
MCHC: 33.3 g/dL (ref 30.0–36.0)
MCV: 83.4 fL (ref 80.0–100.0)
Monocytes Absolute: 0.6 10*3/uL (ref 0.1–1.0)
Monocytes Relative: 9 %
Neutro Abs: 4.6 10*3/uL (ref 1.7–7.7)
Neutrophils Relative %: 72 %
Platelets: 226 10*3/uL (ref 150–400)
RBC: 3.49 MIL/uL — ABNORMAL LOW (ref 3.87–5.11)
RDW: 13.8 % (ref 11.5–15.5)
WBC: 6.4 10*3/uL (ref 4.0–10.5)
nRBC: 0 % (ref 0.0–0.2)

## 2018-09-24 LAB — TSH: TSH: 1.045 u[IU]/mL (ref 0.350–4.500)

## 2018-09-24 NOTE — Progress Notes (Signed)
Location:   [place] Room Number: 153 P Place of Service:  SNF (31)   CODE STATUS: Full Code  Allergies  Allergen Reactions  . Sulfonamide Derivatives Anaphylaxis  . Azithromycin   . Bactrim [Sulfamethoxazole-Trimethoprim] Nausea Only  . Erythromycin Nausea And Vomiting  . Latex Itching and Other (See Comments)    If she wears gloves too long her hands start itching  . Mucinex [Guaifenesin Er] Nausea And Vomiting  . Prednisone Other (See Comments)    Diabetes  . Aspirin Rash  . Levofloxacin Rash  . Tape Rash    Chief Complaint  Patient presents with  . Medical Management of Chronic Issues    Acute ischemic stroke; hypertension associated with type 2 diabetes mellitus; chronic diastolic CHF. Weekly follow up for the first 30 days post hospitalization.     HPI:  She is a 73 year old short term rehab patient being for the management of her chronic illnesses: cva hypertension; chf. She had been hospitalized for acute cva. Her hgb is dropping slowly with her current reading at 9.7 she does have anxiety and depressive thoughts. She did deny suicidal thoughts. She denies any uncontrolled; does have left side weakness.   Past Medical History:  Diagnosis Date  . 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 Arthritis   . Bronchitis   . CHF (congestive heart failure) ([place])   . Chronic diastolic heart failure ([place]) 07/13/2015  . COPD (chronic obstructive pulmonary disease) ([place])   . Depression   . Diabetes mellitus   . DKA (diabetic ketoacidoses) ([place]) 09/27/2014  . GERD (gastroesophageal reflux disease)   . Headache   . Hyperlipidemia   . Hypertension   . Hypothyroid 10/31/2014  . hypothyroidism   . Pneumonia    hx of   . PONV (postoperative nausea and vomiting)    and slow to wake up after foot surgery   . Seizures ([place])    2017  . Shingles   . Syncope 10/31/2014  . Tubular adenoma of colon   . Vertigo 10/31/2014    Past  Surgical History:  Procedure Laterality Date  . ANKLE SURGERY     x 4  . APPENDECTOMY    . CATARACT EXTRACTION Bilateral   . CATARACT EXTRACTION W/ INTRAOCULAR LENS IMPLANT    . CHOLECYSTECTOMY    . COLONOSCOPY W/ BIOPSIES AND POLYPECTOMY    . ESOPHAGOGASTRODUODENOSCOPY    . HARDWARE REMOVAL Left 11/03/2015   Procedure: HARDWARE REMOVAL LEFT FOOT appliction of wound vac;  Surgeon: Gaynelle Arabian, MD;  Location: [facility];  Service: Orthopedics;  Laterality: Left;  . INCISION AND DRAINAGE OF WOUND Left 11/03/2015   Procedure: IRRIGATION AND DEBRIDEMENT LEFT FOOT ULCER;  Surgeon: Gaynelle Arabian, MD;  Location: [facility];  Service: Orthopedics;  Laterality: Left;  . KNEE ARTHROSCOPY    . NASAL SINUS SURGERY    . non cancerous mass removed     small intestine  . oophrectomy    . ORIF CALCANEOUS FRACTURE Left 12/29/2014   Procedure: OPEN REDUCTION INTERNAL FIXATION  LEFT CALCANEOUS FRACTURE;  Surgeon: Gaynelle Arabian, MD;  Location: [facility];  Service: Orthopedics;  Laterality: Left;  . TONSILLECTOMY      Social History   Socioeconomic History  . Marital status: Widowed    Spouse name: Not on file  . Number of children: Not on file  . Years of education: Not on file  . Highest education level:  Not on file  Occupational History  . Occupation: Retired-[employer]  Social Needs  . Financial resource strain: Not on file  . Food insecurity:    Worry: Not on file    Inability: Not on file  . Transportation needs:    Medical: Not on file    Non-medical: Not on file  Tobacco Use  . Smoking status: Never Smoker  . Smokeless tobacco: Never Used  Substance and Sexual Activity  . Alcohol use: No  . Drug use: No  . Sexual activity: Not Currently  Lifestyle  . Physical activity:    Days per week: Not on file    Minutes per session: Not on file  . Stress: Not on file  Relationships  . Social connections:    Talks on phone: Not on file    Gets together: Not on file    Attends religious service: Not on  file    Active member of club or organization: Not on file    Attends meetings of clubs or organizations: Not on file    Relationship status: Not on file  . Intimate partner violence:    Fear of current or ex partner: Not on file    Emotionally abused: Not on file    Physically abused: Not on file    Forced sexual activity: Not on file  Other Topics Concern  . Not on file  Social History Narrative   The patient is widowed. She is retired from [employer]. No children.   One or 2 caffeinated beverages daily   She has split time between [place] in the winter and [place] in the [place]   Family History  Problem Relation Age of Onset  . Diabetes Mother   . Asthma Mother   . Hypertension Father   . Heart disease Father   . Diabetes Father   . Heart disease Sister   . Hypertension Sister   . Asthma Sister   . Diabetes Brother   . CAD Brother   . Stroke Brother   . Stomach cancer Maternal Aunt       VITAL SIGNS BP 138/82   Pulse 80   Temp 98.6 F (37 C)   Resp 20   Ht 5' 8.5" (1.74 m)   Wt 189 lb 12.8 oz (86.1 kg)   BMI 28.44 kg/m   Outpatient Encounter Medications as of 09/24/2018  Medication Sig  . acetaminophen (TYLENOL) 325 MG tablet Take 2 tablets (650 mg total) by mouth every 4 (four) hours as needed for mild pain (or temp > 37.5 C (99.5 F)).  Marland Kitchen albuterol (PROVENTIL HFA;VENTOLIN HFA) 108 (90 Base) MCG/ACT inhaler Inhale 2 puffs into the lungs every 4 (four) hours as needed for wheezing or shortness of breath.   . Ascorbic Acid (VITAMIN C PO) Take 2 tablets by mouth daily.  Marland Kitchen atorvastatin (LIPITOR) 40 MG tablet Take 1 tablet (40 mg total) by mouth daily at 6 PM.  . carvedilol (COREG) 6.25 MG tablet Take 6.25 mg by mouth 2 (two) times daily.  . clopidogrel (PLAVIX) 75 MG tablet Take 1 tablet (75 mg total) by mouth daily.  . colchicine 0.6 MG tablet Take 0.6 mg by mouth daily as needed.  . fluticasone (FLONASE) 50 MCG/ACT nasal spray Place 2 sprays into the nose 2 (two)  times daily as needed for allergies.   Marland Kitchen HYDROcodone-acetaminophen (NORCO) 5-325 MG tablet Take 1 tablet by mouth every 8 (eight) hours as needed for up to 14 days for moderate pain.  Marland Kitchen  insulin aspart (NOVOLOG) 100 UNIT/ML injection Give Subcutaneous before meals and at bedtime as per sliding scale:  if blood sugar 201 to 250 give 4 units, if blood sugar is 251-300 give 6 units, If blood sugar is 301- to 350 give 8 units, if blood sugar is 351 to 400 give 10 units, if blood sugar is 401-450 give 12 units, if blood sugar is greater than 450 call MD.  . Insulin Glargine (LANTUS SOLOSTAR) 100 UNIT/ML Solostar Pen Inject 30 Units into the skin daily.   Marland Kitchen levETIRAcetam (KEPPRA) 500 MG tablet Take 1 tablet (500 mg total) by mouth 2 (two) times daily.  Marland Kitchen levothyroxine (SYNTHROID, LEVOTHROID) 125 MCG tablet Take 125 mcg by mouth daily before breakfast.   . LORazepam (ATIVAN) 0.5 MG tablet Take 1 tablet (0.5 mg total) by mouth every 8 (eight) hours as needed for up to 14 days for anxiety.  . NON FORMULARY Diet Type:  NAS, Consistent Carbohydrate  . Ostomy Supplies (SKIN PREP WIPES) MISC Apply to bilateral heels every shift  . pantoprazole (PROTONIX) 20 MG tablet Take 20 mg by mouth daily.  Marland Kitchen PARoxetine (PAXIL) 40 MG tablet Take 40 mg by mouth daily.  . Polyethylene Glycol 400 (BLINK TEARS OP) Place 1 drop into both eyes 4 (four) times daily as needed (Dry eyes).   . potassium chloride (K-DUR) 10 MEQ tablet TAKE 1 TABLET BY MOUTH ONCE DAILY  . quinapril (ACCUPRIL) 40 MG tablet Take 40 mg by mouth daily.   . Senna-Psyllium (PERDIEM PO) Take 1 tablet by mouth daily as needed (For constipation.).   Marland Kitchen torsemide (DEMADEX) 20 MG tablet Take 20 mg by mouth 2 (two) times daily.  . vitamin B-12 (CYANOCOBALAMIN) 1000 MCG tablet Take 1,000 mcg by mouth daily.   No facility-administered encounter medications on file as of 09/24/2018.      SIGNIFICANT DIAGNOSTIC EXAMS  TODAY:   09-06-18: ct of head: Acute  appearing infarct involving a portion of the right posterior cerebral artery distribution. Specifically, there is cytotoxic edema throughout portions of the medial and posterior right temporal and right occipital lobe with sparing of a portion of the periphery of the right occipital lobe. No other acute appearing infarct evident. No mass or hemorrhage. Foci of arterial vascular calcification noted. Areas of paranasal sinus disease noted.  09-06-18: MRI of head MRA of neck and head:  Large acute to early subacute RIGHT PCA territory infarct affecting the posteromedial temporal lobe, occipital lobe, and RIGHT thalamus. 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 No extracranial stenosis, dissection, or occlusion.   09-07-18: ECHO BUBBLE STUDY:  1. The left ventricle has normal systolic function with an ejection fraction of 60-65%. The cavity size was normal. Left ventricular diastolic Doppler parameters are consistent with impaired relaxation.  2. The right ventricle has normal systolic function. The cavity was normal. There is no increase in right ventricular wall thickness.  3. No evidence present in the left atrial appendage.  4. The mitral valve is degenerative. Mild calcification of the anterior and posterior mitral valve leaflets. There is moderate to severe mitral annular calcification present.  5. The aortic valve is tricuspid. Mild sclerosis of the aortic valve.  6. No pulmonic valve vegetation visualized.  09-08-18: ct of head:  Increasing cytotoxic edema related to the large RIGHT PCA territory infarction. Slight hemorrhagic transformation, most prominent at the RIGHT occipital pole, but not appreciably worse. No new areas of infarction are identified.  LABS REVIEWED TODAY:   09-06-18: wbc 7.0;  hgb 11.1; hct 32.6; mcv 32.6 plt 155; glucose 370; bun 28; creat 1.24; k+ 4.0; na++ 137  09-07-18: hgb a1c 8.5 chol 170; ldl 87; trig 225; hdl 38  09-10-18: wbc 5.4; hgb 10.9; hct  32.9; mcv 85.0; plt 160; glucose 152; bun 31; creat 1.11; k+ 3.5; na+ 141; ca 8.1  09-24-18; wbc 6.4; hgb 9.7; hct 29.1; mcv 83.4 plt 226; glucose 378; bun 33; creat 1.17; k+ 3.9; na++ 136; ca 8.4 alk phos 156; albumin 3.2 total protein 5.9 tsh 1.045   Review of Systems  Constitutional: Negative for malaise/fatigue.  Respiratory: Negative for cough and shortness of breath.   Cardiovascular: Negative for chest pain, palpitations and leg swelling.  Gastrointestinal: Negative for abdominal pain, constipation and heartburn.  Musculoskeletal: Negative for back pain, joint pain and myalgias.  Skin: Negative.   Neurological: Positive for weakness. Negative for dizziness.  Psychiatric/Behavioral: Positive for depression. Negative for suicidal ideas. The patient is nervous/anxious.    Physical Exam Constitutional:      General: She is not in acute distress.    Appearance: Normal appearance. She is well-developed. She is not diaphoretic.  Eyes:     Comments: Loss of left peripheral vision   Neck:     Musculoskeletal: Neck supple.     Thyroid: No thyromegaly.  Cardiovascular:     Rate and Rhythm: Normal rate and regular rhythm.     Pulses: Normal pulses.     Heart sounds: Normal heart sounds.  Pulmonary:     Effort: Pulmonary effort is normal. No respiratory distress.     Breath sounds: Normal breath sounds.  Abdominal:     General: Bowel sounds are normal. There is no distension.     Palpations: Abdomen is soft.     Tenderness: There is no abdominal tenderness.  Musculoskeletal:     Right lower leg: Edema present.     Left lower leg: Edema present.     Comments: Is able to move all extremities Has left side weakness present   Lymphadenopathy:     Cervical: No cervical adenopathy.  Skin:    General: Skin is warm and dry.     Comments: Right lower leg discolored   Neurological:     Mental Status: She is alert and oriented to person, place, and time.  Psychiatric:     Comments: Is  sad        ASSESSMENT/ PLAN:  TODAY:   1. Acute ischemic stroke: is neurologically stable: will continue therapy as indicated and will follow up with neurology. Will continue plavix 75 mg daily is on statin  2. Hypertension associated with type 2 diabetes mellitus: is stable b/p 138/82: will continue accupril 40 mg daily coreg 6.25 mg twice daily   3. Chronic diastolic CHF(congestive heart failure) is stable EF 60-65%(09-07-18) will continue demadex 20 mg twice daily iwht k+ 10 meq daily  4. Chronic non-seasonal allergic rhinitis: is stable will continue albuterol 2 puffs every 4 hours as needed and flonase twice daily as needed  5. GERD without esophagitis: is stable will continue protonix 20 mg daily   6. Acquired hypothyroidism: is stable TSH 1.045 will continue synthroid 125 mcg daily   7. Type 2 diabetes mellitus with neurological manifestations uncontrolled: is without change: hgb a1c 8.5 will continue lantus 30 units nightly and novolog SSI: 201-250: 4 units; 251-300: 6 units; 301-350: 8 units; 351-400: 10 units; 401-450: 12 units her medications have recently been adjusted. Is on ace; statin and plavix  8.  Dyslipidemia associated with type 2 diabetes mellitus: is stable LDL 87 trig 225 will continue lipitor 40 mg daily '  9. Seizure disorder, focal motor: is stable no reports of seizure activity: will continue kepprra 500 mg twice daily   10. Chronic recurrent major depressive disorder: is without change will continue paxil 40 mg daily ativan 0.5 mg every 8 hours as needed for total of 14 days and will begin buspar 5 mg twice daily and will monitor her status.   11. Anemia of chronic disease: is worse: hgb 9.7 is down from 11.0. will guaiac stool and will monitor her status.       MD is aware of resident's narcotic use and is in agreement with current plan of care. We will attempt to wean resident as apropriate   Ok Edwards NP [facility]  Contact  [phone] Monday through Friday 8am- 5pm  After hours call [phone]

## 2018-09-25 ENCOUNTER — Non-Acute Institutional Stay (SKILLED_NURSING_FACILITY): Payer: Medicare Other | Admitting: Adult Health

## 2018-09-25 ENCOUNTER — Encounter: Payer: Self-pay | Admitting: Adult Health

## 2018-09-25 DIAGNOSIS — I5032 Chronic diastolic (congestive) heart failure: Secondary | ICD-10-CM | POA: Diagnosis not present

## 2018-09-25 DIAGNOSIS — I639 Cerebral infarction, unspecified: Secondary | ICD-10-CM

## 2018-09-25 DIAGNOSIS — F339 Major depressive disorder, recurrent, unspecified: Secondary | ICD-10-CM | POA: Diagnosis not present

## 2018-09-25 NOTE — Progress Notes (Signed)
Location:   [facility] Room Number: 143 P Place of Service:  SNF (31)   CODE STATUS: Full Code  Allergies  Allergen Reactions  . Sulfonamide Derivatives Anaphylaxis  . Azithromycin   . Bactrim [Sulfamethoxazole-Trimethoprim] Nausea Only  . Erythromycin Nausea And Vomiting  . Latex Itching and Other (See Comments)    If she wears gloves too long her hands start itching  . Mucinex [Guaifenesin Er] Nausea And Vomiting  . Prednisone Other (See Comments)    Diabetes  . Aspirin Rash  . Levofloxacin Rash  . Tape Rash    Chief Complaint  Patient presents with  . Acute Visit    Care Plan Meeting / Pain Management    HPI:  We have come together for her routine care plan meeting. She is participating in therapy; balance standing; cognition adl training. Her BIMS is 10/15. She continues to require max assist with adls. She is not complaining of any uncontrolled pain and no reports of anxiety present. There are no reports of fevers present.   Past Medical History:  Diagnosis Date  . 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 Arthritis   . Bronchitis   . CHF (congestive heart failure) ([place])   . Chronic diastolic heart failure ([place]) 07/13/2015  . COPD (chronic obstructive pulmonary disease) ([place])   . Depression   . Diabetes mellitus   . DKA (diabetic ketoacidoses) ([place]) 09/27/2014  . GERD (gastroesophageal reflux disease)   . Headache   . Hyperlipidemia   . Hypertension   . Hypothyroid 10/31/2014  . hypothyroidism   . Pneumonia    hx of   . PONV (postoperative nausea and vomiting)    and slow to wake up after foot surgery   . Seizures ([place])    2017  . Shingles   . Syncope 10/31/2014  . Tubular adenoma of colon   . Vertigo 10/31/2014    Past Surgical History:  Procedure Laterality Date  . ANKLE SURGERY     x 4  . APPENDECTOMY    . CATARACT EXTRACTION Bilateral   . CATARACT EXTRACTION W/ INTRAOCULAR LENS IMPLANT    .  CHOLECYSTECTOMY    . COLONOSCOPY W/ BIOPSIES AND POLYPECTOMY    . ESOPHAGOGASTRODUODENOSCOPY    . HARDWARE REMOVAL Left 11/03/2015   Procedure: HARDWARE REMOVAL LEFT FOOT appliction of wound vac;  Surgeon: Gaynelle Arabian, MD;  Location: [facility];  Service: Orthopedics;  Laterality: Left;  . INCISION AND DRAINAGE OF WOUND Left 11/03/2015   Procedure: IRRIGATION AND DEBRIDEMENT LEFT FOOT ULCER;  Surgeon: Gaynelle Arabian, MD;  Location: [facility];  Service: Orthopedics;  Laterality: Left;  . KNEE ARTHROSCOPY    . NASAL SINUS SURGERY    . non cancerous mass removed     small intestine  . oophrectomy    . ORIF CALCANEOUS FRACTURE Left 12/29/2014   Procedure: OPEN REDUCTION INTERNAL FIXATION  LEFT CALCANEOUS FRACTURE;  Surgeon: Gaynelle Arabian, MD;  Location: [facility];  Service: Orthopedics;  Laterality: Left;  . TONSILLECTOMY      Social History   Socioeconomic History  . Marital status: Widowed    Spouse name: Not on file  . Number of children: Not on file  . Years of education: Not on file  . Highest education level: Not on file  Occupational History  . Occupation: Retired-[employer]  Social Needs  . Financial resource strain: Not on file  . Food insecurity:  Worry: Not on file    Inability: Not on file  . Transportation needs:    Medical: Not on file    Non-medical: Not on file  Tobacco Use  . Smoking status: Never Smoker  . Smokeless tobacco: Never Used  Substance and Sexual Activity  . Alcohol use: No  . Drug use: No  . Sexual activity: Not Currently  Lifestyle  . Physical activity:    Days per week: Not on file    Minutes per session: Not on file  . Stress: Not on file  Relationships  . Social connections:    Talks on phone: Not on file    Gets together: Not on file    Attends religious service: Not on file    Active member of club or organization: Not on file    Attends meetings of clubs or organizations: Not on file    Relationship status: Not on file  . Intimate partner  violence:    Fear of current or ex partner: Not on file    Emotionally abused: Not on file    Physically abused: Not on file    Forced sexual activity: Not on file  Other Topics Concern  . Not on file  Social History Narrative   The patient is widowed. She is retired from [employer]. No children.   One or 2 caffeinated beverages daily   She has split time between [place] in the winter and [place] in the [place]   Family History  Problem Relation Age of Onset  . Diabetes Mother   . Asthma Mother   . Hypertension Father   . Heart disease Father   . Diabetes Father   . Heart disease Sister   . Hypertension Sister   . Asthma Sister   . Diabetes Brother   . CAD Brother   . Stroke Brother   . Stomach cancer Maternal Aunt       VITAL SIGNS BP 125/74   Pulse 81   Temp 97.7 F (36.5 C)   Resp 20   Ht 5' 8.5" (1.74 m)   Wt 189 lb 12.8 oz (86.1 kg)   BMI 28.44 kg/m   Outpatient Encounter Medications as of 09/25/2018  Medication Sig  . acetaminophen (TYLENOL) 325 MG tablet Take 2 tablets (650 mg total) by mouth every 4 (four) hours as needed for mild pain (or temp > 37.5 C (99.5 F)).  Marland Kitchen albuterol (PROVENTIL HFA;VENTOLIN HFA) 108 (90 Base) MCG/ACT inhaler Inhale 2 puffs into the lungs every 4 (four) hours as needed for wheezing or shortness of breath.   . Ascorbic Acid (VITAMIN C PO) Take 2 tablets by mouth daily.  Marland Kitchen atorvastatin (LIPITOR) 40 MG tablet Take 1 tablet (40 mg total) by mouth daily at 6 PM.  . carvedilol (COREG) 6.25 MG tablet Take 6.25 mg by mouth 2 (two) times daily.  . clopidogrel (PLAVIX) 75 MG tablet Take 1 tablet (75 mg total) by mouth daily.  . colchicine 0.6 MG tablet Take 0.6 mg by mouth daily as needed.  . fluticasone (FLONASE) 50 MCG/ACT nasal spray Place 2 sprays into the nose 2 (two) times daily as needed for allergies.   Marland Kitchen HYDROcodone-acetaminophen (NORCO) 5-325 MG tablet Take 1 tablet by mouth every 8 (eight) hours as needed for up to 14 days for moderate  pain.  Marland Kitchen insulin aspart (NOVOLOG) 100 UNIT/ML injection Give Subcutaneous before meals and at bedtime as per sliding scale:  if blood sugar 201 to 250 give 4 units,  if blood sugar is 251-300 give 6 units, If blood sugar is 301- to 350 give 8 units, if blood sugar is 351 to 400 give 10 units, if blood sugar is 401-450 give 12 units, if blood sugar is greater than 450 call MD.  . Insulin Glargine (LANTUS SOLOSTAR) 100 UNIT/ML Solostar Pen Inject 30 Units into the skin daily.   Marland Kitchen levETIRAcetam (KEPPRA) 500 MG tablet Take 1 tablet (500 mg total) by mouth 2 (two) times daily.  Marland Kitchen levothyroxine (SYNTHROID, LEVOTHROID) 125 MCG tablet Take 125 mcg by mouth daily before breakfast.   . LORazepam (ATIVAN) 0.5 MG tablet Take 1 tablet (0.5 mg total) by mouth every 8 (eight) hours as needed for up to 14 days for anxiety.  . NON FORMULARY Diet Type:  NAS, Consistent Carbohydrate  . Ostomy Supplies (SKIN PREP WIPES) MISC Apply to bilateral heels every shift  . pantoprazole (PROTONIX) 20 MG tablet Take 20 mg by mouth daily.  Marland Kitchen PARoxetine (PAXIL) 40 MG tablet Take 40 mg by mouth daily.  . Polyethylene Glycol 400 (BLINK TEARS OP) Place 1 drop into both eyes 4 (four) times daily as needed (Dry eyes).   . potassium chloride (K-DUR) 10 MEQ tablet TAKE 1 TABLET BY MOUTH ONCE DAILY  . quinapril (ACCUPRIL) 40 MG tablet Take 40 mg by mouth daily.   . Senna-Psyllium (PERDIEM PO) Take 1 tablet by mouth daily as needed (For constipation.).   Marland Kitchen torsemide (DEMADEX) 20 MG tablet Take 20 mg by mouth 2 (two) times daily.  . vitamin B-12 (CYANOCOBALAMIN) 1000 MCG tablet Take 1,000 mcg by mouth daily.   No facility-administered encounter medications on file as of 09/25/2018.      SIGNIFICANT DIAGNOSTIC EXAMS  PREVIOUS:   09-06-18: ct of head: Acute appearing infarct involving a portion of the right posterior cerebral artery distribution. Specifically, there is cytotoxic edema throughout portions of the medial and posterior right  temporal and right occipital lobe with sparing of a portion of the periphery of the right occipital lobe. No other acute appearing infarct evident. No mass or hemorrhage. Foci of arterial vascular calcification noted. Areas of paranasal sinus disease noted.  09-06-18: MRI of head MRA of neck and head:  Large acute to early subacute RIGHT PCA territory infarct affecting the posteromedial temporal lobe, occipital lobe, and RIGHT thalamus. 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 No extracranial stenosis, dissection, or occlusion.   09-07-18: ECHO BUBBLE STUDY:  1. The left ventricle has normal systolic function with an ejection fraction of 60-65%. The cavity size was normal. Left ventricular diastolic Doppler parameters are consistent with impaired relaxation.  2. The right ventricle has normal systolic function. The cavity was normal. There is no increase in right ventricular wall thickness.  3. No evidence present in the left atrial appendage.  4. The mitral valve is degenerative. Mild calcification of the anterior and posterior mitral valve leaflets. There is moderate to severe mitral annular calcification present.  5. The aortic valve is tricuspid. Mild sclerosis of the aortic valve.  6. No pulmonic valve vegetation visualized.  09-08-18: ct of head:  Increasing cytotoxic edema related to the large RIGHT PCA territory infarction. Slight hemorrhagic transformation, most prominent at the RIGHT occipital pole, but not appreciably worse. No new areas of infarction are identified.  NO NEW EXAMS.   LABS REVIEWED PREVIOUS:   09-06-18: wbc 7.0; hgb 11.1; hct 32.6; mcv 32.6 plt 155; glucose 370; bun 28; creat 1.24; k+ 4.0; na++ 137  09-07-18: hgb a1c  8.5 chol 170; ldl 87; trig 225; hdl 38  09-10-18: wbc 5.4; hgb 10.9; hct 32.9; mcv 85.0; plt 160; glucose 152; bun 31; creat 1.11; k+ 3.5; na+ 141; ca 8.1  09-24-18; wbc 6.4; hgb 9.7; hct 29.1; mcv 83.4 plt 226; glucose 378; bun 33; creat  1.17; k+ 3.9; na++ 136; ca 8.4 alk phos 156; albumin 3.2 total protein 5.9 tsh 1.045  NO NEW LABS.    Review of Systems  Constitutional: Negative for malaise/fatigue.  Respiratory: Negative for cough and shortness of breath.   Cardiovascular: Negative for chest pain, palpitations and leg swelling.  Gastrointestinal: Negative for abdominal pain, constipation and heartburn.  Musculoskeletal: Negative for back pain, joint pain and myalgias.  Skin: Negative.   Neurological: Negative for dizziness.  Psychiatric/Behavioral: The patient is not nervous/anxious.     Physical Exam Constitutional:      General: She is not in acute distress.    Appearance: She is well-developed. She is not diaphoretic.  Eyes:     Comments:  Loss of left peripheral vision    Neck:     Musculoskeletal: Neck supple.     Thyroid: No thyromegaly.  Cardiovascular:     Rate and Rhythm: Normal rate and regular rhythm.     Pulses: Normal pulses.     Heart sounds: Normal heart sounds.  Pulmonary:     Effort: Pulmonary effort is normal. No respiratory distress.     Breath sounds: Normal breath sounds.  Abdominal:     General: Bowel sounds are normal. There is no distension.     Palpations: Abdomen is soft.     Tenderness: There is no abdominal tenderness.  Musculoskeletal:     Right lower leg: No edema.     Left lower leg: No edema.     Comments: Is able to move all extremities Has left side weakness present    Lymphadenopathy:     Cervical: No cervical adenopathy.  Skin:    General: Skin is warm and dry.     Comments: Right lower leg discolored    Neurological:     Mental Status: She is alert and oriented to person, place, and time.  Psychiatric:        Mood and Affect: Mood normal.        ASSESSMENT/ PLAN:  TODAY:   1. Acute ischemic stroke:  2Chronic diastolic CHF(congestive heart failure)  3. Chronic recurrent major depressive disorder:    Will continue current plan of care Will  continue current medications Will continue to monitor her status More than likely she will need long term placement.      MD is aware of resident's narcotic use and is in agreement with current plan of care. We will attempt to wean resident as apropriate   Ok Edwards NP [facility]  Contact [phone] Monday through Friday 8am- 5pm  After hours call [phone]

## 2018-09-26 ENCOUNTER — Ambulatory Visit (HOSPITAL_COMMUNITY)
Admission: RE | Admit: 2018-09-26 | Discharge: 2018-09-26 | Disposition: A | Discharge status: Home / Self Care | Payer: Medicare Other | Source: Ambulatory Visit | Attending: Internal Medicine | Admitting: Internal Medicine

## 2018-09-26 ENCOUNTER — Other Ambulatory Visit: Payer: Self-pay | Admitting: Adult Health

## 2018-09-26 DIAGNOSIS — M25552 Pain in left hip: Secondary | ICD-10-CM | POA: Diagnosis not present

## 2018-09-26 DIAGNOSIS — R52 Pain, unspecified: Secondary | ICD-10-CM | POA: Insufficient documentation

## 2018-09-26 MED ORDER — HYDROCODONE-ACETAMINOPHEN 5-325 MG PO TABS: 1.0000 | ORAL_TABLET | Freq: Three times a day (TID) | ORAL | 0 refills | Status: AC | PRN

## 2018-09-26 MED ORDER — LORAZEPAM 0.5 MG PO TABS: 0.5000 mg | ORAL_TABLET | Freq: Three times a day (TID) | ORAL | 0 refills | Status: AC | PRN

## 2018-09-30 ENCOUNTER — Encounter: Payer: Self-pay | Admitting: Internal Medicine

## 2018-09-30 ENCOUNTER — Non-Acute Institutional Stay (SKILLED_NURSING_FACILITY): Payer: Medicare Other | Admitting: Internal Medicine

## 2018-09-30 DIAGNOSIS — E1165 Type 2 diabetes mellitus with hyperglycemia: Secondary | ICD-10-CM | POA: Diagnosis not present

## 2018-09-30 DIAGNOSIS — F339 Major depressive disorder, recurrent, unspecified: Secondary | ICD-10-CM

## 2018-09-30 DIAGNOSIS — R569 Unspecified convulsions: Secondary | ICD-10-CM | POA: Diagnosis not present

## 2018-09-30 DIAGNOSIS — I639 Cerebral infarction, unspecified: Secondary | ICD-10-CM | POA: Diagnosis not present

## 2018-09-30 NOTE — Progress Notes (Signed)
Location:  [facility] Room Number: [place] of Service:  SNF [phone]) Provider:  Veleta Miners, MD  Asencion Noble, MD  Patient Care Team: Asencion Noble, MD as PCP - General (Internal Medicine) Burnell Blanks, MD as PCP - Cardiology (Cardiology)  Extended Emergency Contact Information Primary Emergency Contact: [place],  [address] [address] of [place] Phone: [phone] Mobile Phone: [phone] Relation: Daughter Secondary Emergency Contact: [place] of [place] Phone: [phone] Relation: Friend  Code Status:  Full Code Goals of care: Advanced Directive information Advanced Directives 09/30/2018  Does Patient Have a Medical Advance Directive? No  Type of Advance Directive -  Does patient want to make changes to medical advance directive? No - Patient declined  Copy of [place] in Chart? -  Would patient like information on creating a medical advance directive? No - Patient declined     Chief Complaint  Patient presents with   Acute Visit    Anxiety and Hyperglycemia    HPI:  Pt is a 73 y.o. female seen today for an acute visit for continue Anxiety, Depression and Her sugars are still running more then 400 in afternoon.  She was admitted in the hospital from03/27-03/12for acute CVA involving posterior circulation.She is here for therapy  Patient has a history of uncontrolled diabetes, hypertension, CHF, gout seizure disorder, anxiety, GERD Since patient has been in the facility she has been very upset with anxiety.  Depression.  She knows that she would not be able to go home without help.  She cries and is upset with all the staff.  She also wanted to know if she can go back to the hospital. Patient's blood sugars have been still running high we had changed her from insulin 500 which she was taking at home as boluses to long-acting insulin Lantus.  She is on  30 units of Lantus.  Her sugars are still running more than 200 throughout the day and sometimes more than 400 in afternoon. Continues to be afebrile,  no cough no shortness of breath.  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    Arthritis    Bronchitis    CHF (congestive heart failure) (HCC)    Chronic diastolic heart failure ([place]) 07/13/2015   COPD (chronic obstructive pulmonary disease) (HCC)    Depression    Diabetes mellitus    DKA (diabetic ketoacidoses) ([place]) 09/27/2014   GERD (gastroesophageal reflux disease)    Headache    Hyperlipidemia    Hypertension    Hypothyroid 10/31/2014   hypothyroidism    Pneumonia    hx of    PONV (postoperative nausea and vomiting)    and slow to wake up after foot surgery    Seizures ([place])    2017   Shingles    Syncope 10/31/2014   Tubular adenoma of colon    Vertigo 10/31/2014   Past Surgical History:  Procedure Laterality Date   ANKLE SURGERY     x 4   APPENDECTOMY     CATARACT EXTRACTION Bilateral    CATARACT EXTRACTION W/ INTRAOCULAR LENS IMPLANT     CHOLECYSTECTOMY     COLONOSCOPY W/ BIOPSIES AND POLYPECTOMY     ESOPHAGOGASTRODUODENOSCOPY     HARDWARE REMOVAL Left 11/03/2015   Procedure: HARDWARE REMOVAL LEFT FOOT appliction of wound vac;  Surgeon: Pilar Plate  [name], MD;  Location: [facility];  Service: Orthopedics;  Laterality: Left;   INCISION AND DRAINAGE OF WOUND Left 11/03/2015   Procedure: IRRIGATION AND DEBRIDEMENT LEFT FOOT ULCER;  Surgeon: Gaynelle Arabian, MD;  Location: [facility];  Service: Orthopedics;  Laterality: Left;   KNEE ARTHROSCOPY     NASAL SINUS SURGERY     non cancerous mass removed     small intestine   oophrectomy     ORIF CALCANEOUS FRACTURE Left 12/29/2014   Procedure: OPEN REDUCTION INTERNAL FIXATION  LEFT CALCANEOUS FRACTURE;  Surgeon: Gaynelle Arabian, MD;  Location: [facility];  Service: Orthopedics;  Laterality: Left;    TONSILLECTOMY      Allergies  Allergen Reactions   Sulfonamide Derivatives Anaphylaxis   Azithromycin    Bactrim [Sulfamethoxazole-Trimethoprim] Nausea Only   Erythromycin Nausea And Vomiting   Latex Itching and Other (See Comments)    If she wears gloves too long her hands start itching   Mucinex [Guaifenesin Er] Nausea And Vomiting   Prednisone Other (See Comments)    Diabetes   Aspirin Rash   Levofloxacin Rash   Tape Rash    Outpatient Encounter Medications as of 09/30/2018  Medication Sig   acetaminophen (TYLENOL) 325 MG tablet Take 2 tablets (650 mg total) by mouth every 4 (four) hours as needed for mild pain (or temp > 37.5 C (99.5 F)).   albuterol (PROVENTIL HFA;VENTOLIN HFA) 108 (90 Base) MCG/ACT inhaler Inhale 2 puffs into the lungs every 4 (four) hours as needed for wheezing or shortness of breath.    Ascorbic Acid (VITAMIN C PO) Take 2 tablets by mouth daily.   atorvastatin (LIPITOR) 40 MG tablet Take 1 tablet (40 mg total) by mouth daily at 6 PM.   busPIRone (BUSPAR) 5 MG tablet Take 5 mg by mouth 2 (two) times daily.   carvedilol (COREG) 6.25 MG tablet Take 6.25 mg by mouth 2 (two) times daily.   clopidogrel (PLAVIX) 75 MG tablet Take 1 tablet (75 mg total) by mouth daily.   colchicine 0.6 MG tablet Take 0.6 mg by mouth daily as needed.   fluticasone (FLONASE) 50 MCG/ACT nasal spray Place 2 sprays into the nose 2 (two) times daily as needed for allergies.    HYDROcodone-acetaminophen (NORCO) 5-325 MG tablet Take 1 tablet by mouth every 8 (eight) hours as needed for up to 7 days for moderate pain.   insulin aspart (NOVOLOG) 100 UNIT/ML injection Give Subcutaneous before meals and at bedtime as per sliding scale:  if blood sugar 201 to 250 give 4 units, if blood sugar is 251-300 give 6 units, If blood sugar is 301- to 350 give 8 units, if blood sugar is 351 to 400 give 10 units, if blood sugar is 401-450 give 12 units, if blood sugar is greater than 450  call MD.   Insulin Glargine (LANTUS SOLOSTAR) 100 UNIT/ML Solostar Pen Inject 30 Units into the skin daily.    levETIRAcetam (KEPPRA) 500 MG tablet Take 1 tablet (500 mg total) by mouth 2 (two) times daily.   levothyroxine (SYNTHROID, LEVOTHROID) 125 MCG tablet Take 125 mcg by mouth daily before breakfast.    LORazepam (ATIVAN) 0.5 MG tablet Take 1 tablet (0.5 mg total) by mouth every 8 (eight) hours as needed for up to 14 days for anxiety.   NON FORMULARY Diet Type:  NAS, Consistent Carbohydrate   Ostomy Supplies (SKIN PREP WIPES) MISC Apply to bilateral heels every shift   pantoprazole (PROTONIX) 20 MG tablet Take 20 mg  by mouth daily.   PARoxetine (PAXIL) 40 MG tablet Take 40 mg by mouth daily.   Polyethylene Glycol 400 (BLINK TEARS OP) Place 1 drop into both eyes 4 (four) times daily as needed (Dry eyes).    potassium chloride (K-DUR) 10 MEQ tablet TAKE 1 TABLET BY MOUTH ONCE DAILY   quinapril (ACCUPRIL) 40 MG tablet Take 40 mg by mouth daily.    Senna-Psyllium (PERDIEM PO) Take 1 tablet by mouth daily as needed (For constipation.).    torsemide (DEMADEX) 20 MG tablet Take 20 mg by mouth 2 (two) times daily.   vitamin B-12 (CYANOCOBALAMIN) 1000 MCG tablet Take 1,000 mcg by mouth daily.   No facility-administered encounter medications on file as of 09/30/2018.     Review of Systems  Constitutional: Positive for activity change.  HENT: Negative.   Respiratory: Negative.   Cardiovascular: Negative.   Gastrointestinal: Negative.   Genitourinary: Negative.   Musculoskeletal: Positive for arthralgias.  Skin:       Has skin tears in her both hands  Neurological: Positive for weakness.  Psychiatric/Behavioral: Positive for dysphoric mood. Negative for behavioral problems. The patient is nervous/anxious.     Immunization History  Administered Date(s) Administered   Influenza Split 03/04/2013   Influenza-Unspecified 04/12/2014, 04/09/2015   Pneumococcal-Unspecified  04/12/2012   Pertinent  Health Maintenance Due  Topic Date Due   FOOT EXAM  10/11/2018 (Originally 10/29/1955)   OPHTHALMOLOGY EXAM  10/11/2018 (Originally 10/29/1955)   PNA vac Low Risk Adult (2 of 2 - PCV13) 10/11/2018 (Originally 04/12/2013)   URINE MICROALBUMIN  10/23/2018 (Originally 10/29/1955)   INFLUENZA VACCINE  01/11/2019   HEMOGLOBIN A1C  03/10/2019   MAMMOGRAM  04/28/2019   COLONOSCOPY  01/11/2022   DEXA SCAN  Completed   Fall Risk  06/01/2017  Falls in the past year? No   Functional Status Survey:    Vitals:   09/30/18 1055  BP: (!) 114/50  Pulse: 78  Resp: 20  Temp: 99.3 F (37.4 C)  Weight: 189 lb 12.8 oz (86.1 kg)  Height: 5' 8.5" (1.74 m)   Body mass index is 28.44 kg/m. Physical Exam Vitals signs reviewed.  Constitutional:      Appearance: Normal appearance.     Comments: Teary and upset  HENT:     Head: Normocephalic.     Nose: Nose normal.     Mouth/Throat:     Mouth: Mucous membranes are moist.     Pharynx: Oropharynx is clear.  Eyes:     Pupils: Pupils are equal, round, and reactive to light.  Neck:     Musculoskeletal: Neck supple.  Cardiovascular:     Rate and Rhythm: Normal rate and regular rhythm.     Pulses: Normal pulses.     Heart sounds: Normal heart sounds.  Pulmonary:     Effort: Pulmonary effort is normal. No respiratory distress.     Breath sounds: Normal breath sounds. No wheezing or rales.  Abdominal:     General: Abdomen is flat. Bowel sounds are normal. There is no distension.     Palpations: Abdomen is soft.     Tenderness: There is no abdominal tenderness. There is no guarding.  Musculoskeletal:     Comments: Mild swelling in her Right LE with discoloration around ankles which patient says is old  Skin:    General: Skin is warm and dry.  Neurological:     Mental Status: She is alert and oriented to person, place, and time.  Comments: Has Mild weakness in Left UE and LE with some incoordination. Also has  peripheral loss of vision in Left Eye  Psychiatric:        Attention and Perception: She is inattentive.        Mood and Affect: Mood is anxious and depressed.        Speech: Speech is rapid and pressured.     Comments: Patient is very anxious and teary     Labs reviewed: Recent Labs    09/08/18 0531 09/10/18 0413 09/24/18 0700  NA 136 141 136  K 3.7 3.5 3.9  CL 103 106 100  CO2 22 24 27   GLUCOSE 343* 152* 378*  BUN 22 31* 33*  CREATININE 1.13* 1.11* 1.17*  CALCIUM 8.0* 8.1* 8.4*   Recent Labs    09/24/18 0700  AST 13*  ALT 10  ALKPHOS 156*  BILITOT 1.0  PROT 5.9*  ALBUMIN 3.2*   Recent Labs    09/06/18 1317 09/08/18 0531 09/10/18 0413 09/24/18 0700  WBC 7.0 5.5 5.4 6.4  NEUTROABS 5.7  --   --  4.6  HGB 11.1* 10.4* 10.9* 9.7*  HCT 32.6* 30.8* 32.9* 29.1*  MCV 84.5 83.7 85.0 83.4  PLT 155 161 160 226   Lab Results  Component Value Date   TSH 1.045 09/24/2018   Lab Results  Component Value Date   HGBA1C 8.5 (H) 09/07/2018   Lab Results  Component Value Date   CHOL 170 09/07/2018   HDL 38 (L) 09/07/2018   LDLCALC 87 09/07/2018   TRIG 225 (H) 09/07/2018   CHOLHDL 4.5 09/07/2018    Significant Diagnostic Results in last 30 days:  Dg Pelvis 1-2 Views  Result Date: 09/09/2018 CLINICAL DATA:  Left hip pain since fall three days ago. EXAM: PELVIS - 1-2 VIEW COMPARISON:  None. FINDINGS: Mild diffuse osteopenia. Mild symmetric degenerative change of the hips. No acute fracture or dislocation. Mild degenerative change of the spine. IMPRESSION: No acute findings. Mild symmetric degenerative change of the hips.  Osteopenia. Electronically Signed   By: Marin Olp M.D.   On: 09/09/2018 19:41   Ct Head Wo Contrast  Result Date: 09/10/2018 CLINICAL DATA:  Right PCA territory infarct follow-up. EXAM: CT HEAD WITHOUT CONTRAST TECHNIQUE: Contiguous axial images were obtained from the base of the skull through the vertex without intravenous contrast. COMPARISON:   CT head dated September 08, 2018. FINDINGS: Brain: Unchanged cytotoxic edema related to large right PCA territory infarct involving the medial temporal and occipital lobes. Small foci of hemorrhagic transformation are unchanged. Increasing cytotoxic edema in the right thalamus. No evidence of hydrocephalus, extra-axial collection or mass lesion. Vascular: Calcified atherosclerosis at the skullbase. No hyperdense vessel. Skull: Negative for fracture or focal lesion. Sinuses/Orbits: No acute finding. Other: None. IMPRESSION: 1. Unchanged large right PCA territory infarct with small foci of hemorrhagic transformation. 2. Increasing cytotoxic edema in the right thalamus related to underlying infarct. Electronically Signed   By: Titus Dubin M.D.   On: 09/10/2018 10:20   Ct Head Wo Contrast  Result Date: 09/08/2018 CLINICAL DATA:  Worsening symptoms of acute stroke after a fall. Incoordination of LEFT upper and lower extremity. EXAM: CT HEAD WITHOUT CONTRAST TECHNIQUE: Contiguous axial images were obtained from the base of the skull through the vertex without intravenous contrast. COMPARISON:  CT head 09/06/2018. MR head 09/06/2018 FINDINGS: Brain: Increased cytotoxic edema related to the large RIGHT PCA territory infarction due to proximal PCA occlusion, with mild swelling of the  RIGHT occipital lobe and posterior temporal lobe. The infarcted brain appears lower in attenuation and increasingly well demarcated from surrounding normal structures. Slight hemorrhagic transformation, most prominent at the RIGHT occipital pole, is better seen, but not appreciably worse. Infarction involving the RIGHT medial temporal lobe does not appear to result in sufficient swelling to compress the brainstem at the midbrain level, nor are there new areas of infarction involving the RIGHT MCA territory. Vascular: No hyperdense vessel or unexpected calcification. Skull: Normal. Negative for fracture or focal lesion. Sinuses/Orbits: No  acute finding. Other: None. IMPRESSION: Increasing cytotoxic edema related to the large RIGHT PCA territory infarction. Slight hemorrhagic transformation, most prominent at the RIGHT occipital pole, but not appreciably worse. No new areas of infarction are identified. Electronically Signed   By: Staci Righter M.D.   On: 09/08/2018 12:52   Ct Head Wo Contrast  Result Date: 09/06/2018 CLINICAL DATA:  Syncope with recent fall EXAM: CT HEAD WITHOUT CONTRAST TECHNIQUE: Contiguous axial images were obtained from the base of the skull through the vertex without intravenous contrast. COMPARISON:  May 30, 2017 FINDINGS: Brain: There is mild diffuse atrophy. There is decreased attenuation throughout much of the right posteromedial temporal and right medial and posterior occipital lobes. There is relative sparing of the periphery of the right temporal and occipital lobes. There is no demonstrable mass, hemorrhage, extra-axial fluid collection, or midline shift. Brain parenchyma elsewhere appears unremarkable. Vascular: There is no hyperdense vessel appreciable. There is calcification in each carotid siphon region. Skull: Bony calvarium appears intact. Sinuses/Orbits: There is opacification of multiple ethmoid air cells. There is mucosal thickening in the left sphenoid sinus. There is edema in each nasal turbinates. Orbits appear symmetric bilaterally. Other: Mastoid air cells are clear. IMPRESSION: Acute appearing infarct involving a portion of the right posterior cerebral artery distribution. Specifically, there is cytotoxic edema throughout portions of the medial and posterior right temporal and right occipital lobe with sparing of a portion of the periphery of the right occipital lobe. No other acute appearing infarct evident. No mass or hemorrhage. Foci of arterial vascular calcification noted. Areas of paranasal sinus disease noted. Electronically Signed   By: Lowella Grip III M.D.   On: 09/06/2018 14:09    Mr Jodene Nam Head Wo Contrast  Result Date: 09/06/2018 CLINICAL DATA:  Syncopal episode.  Blurry vision. EXAM: MRI HEAD WITHOUT AND WITH CONTRAST AND MRA HEAD WITHOUT CONTRAST AND MRI NECK WITHOUT AND WITH CONTRAST TECHNIQUE: Multiplanar, multiecho pulse sequences of the brain and surrounding structures were obtained without and with intravenous contrast. Angiographic images of the head were obtained using MRA technique without and with contrast. Multiplanar, multiecho pulse sequences of the neck and surrounding structures were obtained without and with intravenous contrast. CONTRAST:  Gadavist 9 mL. COMPARISON:  CT head earlier today. FINDINGS: MRI HEAD FINDINGS Brain: Large acute/early subacute RIGHT PCA territory infarct affects the posterior and medial temporal lobe, and occipital lobe. Punctate areas of acute infarction affect the RIGHT thalamus. No brainstem or cerebellar involvement. Mild T1 and T2 shortening, gyriform pattern, affecting the RIGHT occipital lobe, suggests early reperfusion hemorrhage. Only slight postcontrast enhancement. No midline shift. Mild cerebral and cerebellar atrophy. Mild subcortical and periventricular T2 and FLAIR hyperintensities, likely chronic microvascular ischemic change. Vascular: Reported separately Skull and upper cervical spine: Unremarkable Sinuses/Orbits: Mucosal thickening.  BILATERAL cataract extraction Other: None MRA HEAD FINDINGS The internal carotid arteries are widely patent. The basilar artery is widely patent with vertebrals codominant although there may be mild nonstenotic  irregularity of the distal RIGHT vertebral SP at the vertebrobasilar junction. No proximal stenosis of the middle cerebral arteries. Non dominant LEFT A1 anterior cerebral artery. The RIGHT P1 PCA is completely occluded at its origin. Faint attempts at collateralization are seen from the RIGHT PCOM. No cerebellar branch occlusion. No saccular aneurysm. MRA NECK FINDINGS Conventional  branching of the great vessels from the arch. No proximal stenosis. Carotid bifurcations demonstrate no flow reducing lesion. The common carotid arteries and internal carotid arteries are widely patent without evidence of dissection or vasculitis. No fibromuscular change. No visible ostial stenosis of the codominant vertebral arteries. Vertebral arteries appear widely patent throughout the neck into the intracranial segments to the basilar confluence. IMPRESSION: Large acute to early subacute RIGHT PCA territory infarct affecting the posteromedial temporal lobe, occipital lobe, and RIGHT thalamus. 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 No extracranial stenosis, dissection, or occlusion. Electronically Signed   By: Staci Righter M.D.   On: 09/06/2018 17:50   Mr Angiogram Neck W Or Wo Contrast  Result Date: 09/06/2018 CLINICAL DATA:  Syncopal episode.  Blurry vision. EXAM: MRI HEAD WITHOUT AND WITH CONTRAST AND MRA HEAD WITHOUT CONTRAST AND MRI NECK WITHOUT AND WITH CONTRAST TECHNIQUE: Multiplanar, multiecho pulse sequences of the brain and surrounding structures were obtained without and with intravenous contrast. Angiographic images of the head were obtained using MRA technique without and with contrast. Multiplanar, multiecho pulse sequences of the neck and surrounding structures were obtained without and with intravenous contrast. CONTRAST:  Gadavist 9 mL. COMPARISON:  CT head earlier today. FINDINGS: MRI HEAD FINDINGS Brain: Large acute/early subacute RIGHT PCA territory infarct affects the posterior and medial temporal lobe, and occipital lobe. Punctate areas of acute infarction affect the RIGHT thalamus. No brainstem or cerebellar involvement. Mild T1 and T2 shortening, gyriform pattern, affecting the RIGHT occipital lobe, suggests early reperfusion hemorrhage. Only slight postcontrast enhancement. No midline shift. Mild cerebral and cerebellar atrophy. Mild subcortical and periventricular  T2 and FLAIR hyperintensities, likely chronic microvascular ischemic change. Vascular: Reported separately Skull and upper cervical spine: Unremarkable Sinuses/Orbits: Mucosal thickening.  BILATERAL cataract extraction Other: None MRA HEAD FINDINGS The internal carotid arteries are widely patent. The basilar artery is widely patent with vertebrals codominant although there may be mild nonstenotic irregularity of the distal RIGHT vertebral SP at the vertebrobasilar junction. No proximal stenosis of the middle cerebral arteries. Non dominant LEFT A1 anterior cerebral artery. The RIGHT P1 PCA is completely occluded at its origin. Faint attempts at collateralization are seen from the RIGHT PCOM. No cerebellar branch occlusion. No saccular aneurysm. MRA NECK FINDINGS Conventional branching of the great vessels from the arch. No proximal stenosis. Carotid bifurcations demonstrate no flow reducing lesion. The common carotid arteries and internal carotid arteries are widely patent without evidence of dissection or vasculitis. No fibromuscular change. No visible ostial stenosis of the codominant vertebral arteries. Vertebral arteries appear widely patent throughout the neck into the intracranial segments to the basilar confluence. IMPRESSION: Large acute to early subacute RIGHT PCA territory infarct affecting the posteromedial temporal lobe, occipital lobe, and RIGHT thalamus. 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 No extracranial stenosis, dissection, or occlusion. Electronically Signed   By: Staci Righter M.D.   On: 09/06/2018 17:50   Mr Jeri Cos And Wo Contrast  Result Date: 09/06/2018 CLINICAL DATA:  Syncopal episode.  Blurry vision. EXAM: MRI HEAD WITHOUT AND WITH CONTRAST AND MRA HEAD WITHOUT CONTRAST AND MRI NECK WITHOUT AND WITH CONTRAST TECHNIQUE: Multiplanar, multiecho pulse sequences  of the brain and surrounding structures were obtained without and with intravenous contrast. Angiographic images  of the head were obtained using MRA technique without and with contrast. Multiplanar, multiecho pulse sequences of the neck and surrounding structures were obtained without and with intravenous contrast. CONTRAST:  Gadavist 9 mL. COMPARISON:  CT head earlier today. FINDINGS: MRI HEAD FINDINGS Brain: Large acute/early subacute RIGHT PCA territory infarct affects the posterior and medial temporal lobe, and occipital lobe. Punctate areas of acute infarction affect the RIGHT thalamus. No brainstem or cerebellar involvement. Mild T1 and T2 shortening, gyriform pattern, affecting the RIGHT occipital lobe, suggests early reperfusion hemorrhage. Only slight postcontrast enhancement. No midline shift. Mild cerebral and cerebellar atrophy. Mild subcortical and periventricular T2 and FLAIR hyperintensities, likely chronic microvascular ischemic change. Vascular: Reported separately Skull and upper cervical spine: Unremarkable Sinuses/Orbits: Mucosal thickening.  BILATERAL cataract extraction Other: None MRA HEAD FINDINGS The internal carotid arteries are widely patent. The basilar artery is widely patent with vertebrals codominant although there may be mild nonstenotic irregularity of the distal RIGHT vertebral SP at the vertebrobasilar junction. No proximal stenosis of the middle cerebral arteries. Non dominant LEFT A1 anterior cerebral artery. The RIGHT P1 PCA is completely occluded at its origin. Faint attempts at collateralization are seen from the RIGHT PCOM. No cerebellar branch occlusion. No saccular aneurysm. MRA NECK FINDINGS Conventional branching of the great vessels from the arch. No proximal stenosis. Carotid bifurcations demonstrate no flow reducing lesion. The common carotid arteries and internal carotid arteries are widely patent without evidence of dissection or vasculitis. No fibromuscular change. No visible ostial stenosis of the codominant vertebral arteries. Vertebral arteries appear widely patent  throughout the neck into the intracranial segments to the basilar confluence. IMPRESSION: Large acute to early subacute RIGHT PCA territory infarct affecting the posteromedial temporal lobe, occipital lobe, and RIGHT thalamus. 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 No extracranial stenosis, dissection, or occlusion. Electronically Signed   By: Staci Righter M.D.   On: 09/06/2018 17:50   Dg Hip Unilat With Pelvis 2-3 Views Left  Result Date: 09/26/2018 CLINICAL DATA:  Left hip pain EXAM: DG HIP (WITH OR WITHOUT PELVIS) 2-3V LEFT COMPARISON:  None. FINDINGS: Both hip joints appear normal without joint space narrowing or osteophyte formation. No sign of a vascular necrosis. No traumatic finding. 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 I think there is also probably a degree of sacroiliac osteoarthritis. Multiple surgical clips noted in the pelvis. Regional arterial calcification is noted. IMPRESSION: No abnormality seen of the hip joint itself. Changes of the symphysis pubis and sacroiliac joints which could be painful. Electronically Signed   By: Nelson Chimes M.D.   On: 09/26/2018 11:12    Assessment/Plan Uncontrolled Diabetes Mellitus Will increase her Lantus to 40 units Continue Novolog Sliding scale for now  Depression with anxiety Continue on Buspar, Paxil and Ativan PRN No Change in her Meds right now Waiting for Psych to see her  Acute ischemic stroke Started on Plavix in the hospital Event monitor from Cardiology still pending Follow up with [facility] as she would notfollow with [facility] BP stable ON statin Chronic diastolic CHF (congestive heart failure) Echo showed EF of 60% On Toresimide Her weight is stable Seizure ContinueKeppra  Hyperlipidemia, Started on Statin LDL was 87 in hospital  Hypothyroid Repeat TSH was normal Continue supplement Hypertension On Coreg Accupril and  Norvasc will continue to monitor    Family/ staff Communication:  Labs/tests ordered:   Total time spent in this patient care encounter was  25_  minutes; greater than 50% of the visit spent counseling patient and staff, reviewing records , Labs and coordinating care for problems addressed at this encounter.

## 2018-10-01 ENCOUNTER — Encounter: Payer: Self-pay | Admitting: Adult Health

## 2018-10-01 ENCOUNTER — Non-Acute Institutional Stay (SKILLED_NURSING_FACILITY): Payer: Medicare Other | Admitting: Adult Health

## 2018-10-01 DIAGNOSIS — F339 Major depressive disorder, recurrent, unspecified: Secondary | ICD-10-CM | POA: Diagnosis not present

## 2018-10-01 DIAGNOSIS — I639 Cerebral infarction, unspecified: Secondary | ICD-10-CM

## 2018-10-01 DIAGNOSIS — E1165 Type 2 diabetes mellitus with hyperglycemia: Secondary | ICD-10-CM

## 2018-10-01 DIAGNOSIS — IMO0002 Reserved for concepts with insufficient information to code with codable children: Secondary | ICD-10-CM

## 2018-10-01 DIAGNOSIS — E1149 Type 2 diabetes mellitus with other diabetic neurological complication: Secondary | ICD-10-CM

## 2018-10-01 NOTE — Progress Notes (Signed)
Location:   [facility] Room Number: 143 P Place of Service:  SNF (31)   CODE STATUS: Full Code  Allergies  Allergen Reactions  . Sulfonamide Derivatives Anaphylaxis  . Azithromycin   . Bactrim [Sulfamethoxazole-Trimethoprim] Nausea Only  . Erythromycin Nausea And Vomiting  . Latex Itching and Other (See Comments)    If she wears gloves too long her hands start itching  . Mucinex [Guaifenesin Er] Nausea And Vomiting  . Prednisone Other (See Comments)    Diabetes  . Aspirin Rash  . Levofloxacin Rash  . Tape Rash    Chief Complaint  Patient presents with  . Medical Management of Chronic Issues    Type 2 diabetes mellitus with neurological manifestations; uncontrolled; chronic recurrent major depressive disorder; acute ischemic stroke. Weekly follow up for the first 30 days post hospitalization    HPI:  She is a 73 year old short term rehab being seen for the management of her chronic illnesses: diabetes; depression; stroke. Her cbgs remain elevated she has recently had her lantus increased. She is emotionally inappropriate; crying easily and at times with no real reason. She denies any uncontrolled pain; no changes in appetite; she does participate in therapy.   Past Medical History:  Diagnosis Date  . 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 Arthritis   . Bronchitis   . CHF (congestive heart failure) ([place])   . Chronic diastolic heart failure ([place]) 07/13/2015  . COPD (chronic obstructive pulmonary disease) ([place])   . Depression   . Diabetes mellitus   . DKA (diabetic ketoacidoses) ([place]) 09/27/2014  . GERD (gastroesophageal reflux disease)   . Headache   . Hyperlipidemia   . Hypertension   . Hypothyroid 10/31/2014  . hypothyroidism   . Pneumonia    hx of   . PONV (postoperative nausea and vomiting)    and slow to wake up after foot surgery   . Seizures ([place])    2017  . Shingles   . Syncope 10/31/2014  . Tubular adenoma of  colon   . Vertigo 10/31/2014    Past Surgical History:  Procedure Laterality Date  . ANKLE SURGERY     x 4  . APPENDECTOMY    . CATARACT EXTRACTION Bilateral   . CATARACT EXTRACTION W/ INTRAOCULAR LENS IMPLANT    . CHOLECYSTECTOMY    . COLONOSCOPY W/ BIOPSIES AND POLYPECTOMY    . ESOPHAGOGASTRODUODENOSCOPY    . HARDWARE REMOVAL Left 11/03/2015   Procedure: HARDWARE REMOVAL LEFT FOOT appliction of wound vac;  Surgeon: Gaynelle Arabian, MD;  Location: [facility];  Service: Orthopedics;  Laterality: Left;  . INCISION AND DRAINAGE OF WOUND Left 11/03/2015   Procedure: IRRIGATION AND DEBRIDEMENT LEFT FOOT ULCER;  Surgeon: Gaynelle Arabian, MD;  Location: [facility];  Service: Orthopedics;  Laterality: Left;  . KNEE ARTHROSCOPY    . NASAL SINUS SURGERY    . non cancerous mass removed     small intestine  . oophrectomy    . ORIF CALCANEOUS FRACTURE Left 12/29/2014   Procedure: OPEN REDUCTION INTERNAL FIXATION  LEFT CALCANEOUS FRACTURE;  Surgeon: Gaynelle Arabian, MD;  Location: [facility];  Service: Orthopedics;  Laterality: Left;  . TONSILLECTOMY      Social History   Socioeconomic History  . Marital status: Widowed    Spouse name: Not on file  . Number of children: Not on file  . Years of education: Not on file  . Highest education  level: Not on file  Occupational History  . Occupation: Retired-[employer]  Social Needs  . Financial resource strain: Not on file  . Food insecurity:    Worry: Not on file    Inability: Not on file  . Transportation needs:    Medical: Not on file    Non-medical: Not on file  Tobacco Use  . Smoking status: Never Smoker  . Smokeless tobacco: Never Used  Substance and Sexual Activity  . Alcohol use: No  . Drug use: No  . Sexual activity: Not Currently  Lifestyle  . Physical activity:    Days per week: Not on file    Minutes per session: Not on file  . Stress: Not on file  Relationships  . Social connections:    Talks on phone: Not on file    Gets together: Not on  file    Attends religious service: Not on file    Active member of club or organization: Not on file    Attends meetings of clubs or organizations: Not on file    Relationship status: Not on file  . Intimate partner violence:    Fear of current or ex partner: Not on file    Emotionally abused: Not on file    Physically abused: Not on file    Forced sexual activity: Not on file  Other Topics Concern  . Not on file  Social History Narrative   The patient is widowed. She is retired from [employer]. No children.   One or 2 caffeinated beverages daily   She has split time between [place] in the winter and [place] in the [place]   Family History  Problem Relation Age of Onset  . Diabetes Mother   . Asthma Mother   . Hypertension Father   . Heart disease Father   . Diabetes Father   . Heart disease Sister   . Hypertension Sister   . Asthma Sister   . Diabetes Brother   . CAD Brother   . Stroke Brother   . Stomach cancer Maternal Aunt       VITAL SIGNS BP (!) 146/55   Pulse 67   Temp 97.7 F (36.5 C)   Resp 20   Ht 5' 8.5" (1.74 m)   Wt 194 lb (88 kg)   BMI 29.07 kg/m   Outpatient Encounter Medications as of 10/01/2018  Medication Sig  . acetaminophen (TYLENOL) 325 MG tablet Take 2 tablets (650 mg total) by mouth every 4 (four) hours as needed for mild pain (or temp > 37.5 C (99.5 F)).  Marland Kitchen albuterol (PROVENTIL HFA;VENTOLIN HFA) 108 (90 Base) MCG/ACT inhaler Inhale 2 puffs into the lungs every 4 (four) hours as needed for wheezing or shortness of breath.   . Ascorbic Acid (VITAMIN C PO) Take 2 tablets by mouth daily.  Marland Kitchen atorvastatin (LIPITOR) 40 MG tablet Take 1 tablet (40 mg total) by mouth daily at 6 PM.  . busPIRone (BUSPAR) 5 MG tablet Take 5 mg by mouth 2 (two) times daily.  . carvedilol (COREG) 6.25 MG tablet Take 6.25 mg by mouth 2 (two) times daily.  . clopidogrel (PLAVIX) 75 MG tablet Take 1 tablet (75 mg total) by mouth daily.  . colchicine 0.6 MG tablet Take 0.6 mg  by mouth daily as needed.  . fluticasone (FLONASE) 50 MCG/ACT nasal spray Place 2 sprays into the nose 2 (two) times daily as needed for allergies.   Marland Kitchen HYDROcodone-acetaminophen (NORCO) 5-325 MG tablet Take 1 tablet by  mouth every 8 (eight) hours as needed for up to 7 days for moderate pain.  Marland Kitchen insulin aspart (NOVOLOG) 100 UNIT/ML injection Give Subcutaneous before meals and at bedtime as per sliding scale:  if blood sugar 201 to 250 give 4 units, if blood sugar is 251-300 give 6 units, If blood sugar is 301- to 350 give 8 units, if blood sugar is 351 to 400 give 10 units, if blood sugar is 401-450 give 12 units, if blood sugar is greater than 450 call MD.  . Insulin Glargine (LANTUS SOLOSTAR) 100 UNIT/ML Solostar Pen Inject 40 Units into the skin daily.   Marland Kitchen levETIRAcetam (KEPPRA) 500 MG tablet Take 1 tablet (500 mg total) by mouth 2 (two) times daily.  Marland Kitchen levothyroxine (SYNTHROID, LEVOTHROID) 125 MCG tablet Take 125 mcg by mouth daily before breakfast.   . LORazepam (ATIVAN) 0.5 MG tablet Take 1 tablet (0.5 mg total) by mouth every 8 (eight) hours as needed for up to 14 days for anxiety.  . NON FORMULARY Diet Type:  NAS, Consistent Carbohydrate  . Ostomy Supplies (SKIN PREP WIPES) MISC Apply to bilateral heels every shift  . pantoprazole (PROTONIX) 20 MG tablet Take 20 mg by mouth daily.  Marland Kitchen PARoxetine (PAXIL) 40 MG tablet Take 40 mg by mouth daily.  . Polyethylene Glycol 400 (BLINK TEARS OP) Place 1 drop into both eyes 4 (four) times daily as needed (Dry eyes).   . potassium chloride (K-DUR) 10 MEQ tablet TAKE 1 TABLET BY MOUTH ONCE DAILY  . quinapril (ACCUPRIL) 40 MG tablet Take 40 mg by mouth daily.   . Senna-Psyllium (PERDIEM PO) Take 1 tablet by mouth daily as needed (For constipation.).   Marland Kitchen torsemide (DEMADEX) 20 MG tablet Take 20 mg by mouth 2 (two) times daily.  . vitamin B-12 (CYANOCOBALAMIN) 1000 MCG tablet Take 1,000 mcg by mouth daily.   No facility-administered encounter medications  on file as of 10/01/2018.      SIGNIFICANT DIAGNOSTIC EXAMS  PREVIOUS:   09-06-18: ct of head: Acute appearing infarct involving a portion of the right posterior cerebral artery distribution. Specifically, there is cytotoxic edema throughout portions of the medial and posterior right temporal and right occipital lobe with sparing of a portion of the periphery of the right occipital lobe. No other acute appearing infarct evident. No mass or hemorrhage. Foci of arterial vascular calcification noted. Areas of paranasal sinus disease noted.  09-06-18: MRI of head MRA of neck and head:  Large acute to early subacute RIGHT PCA territory infarct affecting the posteromedial temporal lobe, occipital lobe, and RIGHT thalamus. 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 No extracranial stenosis, dissection, or occlusion.   09-07-18: ECHO BUBBLE STUDY:  1. The left ventricle has normal systolic function with an ejection fraction of 60-65%. The cavity size was normal. Left ventricular diastolic Doppler parameters are consistent with impaired relaxation.  2. The right ventricle has normal systolic function. The cavity was normal. There is no increase in right ventricular wall thickness.  3. No evidence present in the left atrial appendage.  4. The mitral valve is degenerative. Mild calcification of the anterior and posterior mitral valve leaflets. There is moderate to severe mitral annular calcification present.  5. The aortic valve is tricuspid. Mild sclerosis of the aortic valve.  6. No pulmonic valve vegetation visualized.  09-08-18: ct of head:  Increasing cytotoxic edema related to the large RIGHT PCA territory infarction. Slight hemorrhagic transformation, most prominent at the RIGHT occipital pole, but not appreciably  worse. No new areas of infarction are identified.  NO NEW EXAMS.   LABS REVIEWED PREVIOUS:   09-06-18: wbc 7.0; hgb 11.1; hct 32.6; mcv 32.6 plt 155; glucose 370; bun  28; creat 1.24; k+ 4.0; na++ 137  09-07-18: hgb a1c 8.5 chol 170; ldl 87; trig 225; hdl 38  09-10-18: wbc 5.4; hgb 10.9; hct 32.9; mcv 85.0; plt 160; glucose 152; bun 31; creat 1.11; k+ 3.5; na+ 141; ca 8.1  09-24-18; wbc 6.4; hgb 9.7; hct 29.1; mcv 83.4 plt 226; glucose 378; bun 33; creat 1.17; k+ 3.9; na++ 136; ca 8.4 alk phos 156; albumin 3.2 total protein 5.9 tsh 1.045  NO NEW LABS.   Review of Systems  Constitutional: Negative for malaise/fatigue.  Respiratory: Negative for cough and shortness of breath.   Cardiovascular: Negative for chest pain, palpitations and leg swelling.  Gastrointestinal: Negative for abdominal pain, constipation and heartburn.  Musculoskeletal: Negative for back pain, joint pain and myalgias.  Skin: Negative.   Neurological: Negative for dizziness.  Psychiatric/Behavioral:       Cries easily      Physical Exam Constitutional:      General: She is not in acute distress.    Appearance: She is well-developed. She is not diaphoretic.  Eyes:     Comments: Loss of left peripheral vision     Neck:     Musculoskeletal: Neck supple.     Thyroid: No thyromegaly.  Cardiovascular:     Rate and Rhythm: Normal rate and regular rhythm.     Pulses: Normal pulses.     Heart sounds: Normal heart sounds.  Pulmonary:     Effort: Pulmonary effort is normal. No respiratory distress.     Breath sounds: Normal breath sounds.  Abdominal:     General: Bowel sounds are normal. There is no distension.     Palpations: Abdomen is soft.     Tenderness: There is no abdominal tenderness.  Musculoskeletal:     Right lower leg: No edema.     Left lower leg: No edema.     Comments: Has left hemiplegia present   Lymphadenopathy:     Cervical: No cervical adenopathy.  Skin:    General: Skin is warm and dry.  Neurological:     Mental Status: She is alert. Mental status is at baseline.  Psychiatric:     Comments: Is emotionally inappropriate Cries frequently and easily         ASSESSMENT/ PLAN:  TODAY:   1. Type 2 diabetes mellitus with neurological manifestations: uncontrolled: hgb a1c 8.5 will continue lantus at 40 units; will add 8 units novolog with meals in addition to SSI: 201-250: 4 units; 251-300: 6 units; 301-350: 10 units; 351-400: 10 units; 401-450: 12 units; is on ace statin plavix.   2. Chronic recurrent depressive disorder: is without changes will continue paxil 40 mg daily ativan 0.5 mg every 8 hours as needed for 14 days and will increase buspar to 5 mg three times daily   We need to consider possibility of PBA.   3. Acute ischemic stroke: is neurologically stable will continue therapy as indicated and will follow up with neurology; will continue plavix 75 mg daily is on statin.   PREVIOUS   4. Hypertension associated with type 2 diabetes mellitus: is stable b/p 138/82: will continue accupril 40 mg daily coreg 6.25 mg twice daily  5. Chronic diastolic CHF(congestive heart failure) is stable EF 60-65%(09-07-18) will continue demadex 20 mg twice daily iwht k+ 10  meq daily  6. Chronic non-seasonal allergic rhinitis: is stable will continue albuterol 2 puffs every 4 hours as needed and flonase twice daily as needed  7. GERD without esophagitis: is stable will continue protonix 20 mg daily   8. Acquired hypothyroidism: is stable TSH 1.045 will continue synthroid 125 mcg daily   9.  Dyslipidemia associated with type 2 diabetes mellitus: is stable LDL 87 trig 225 will continue lipitor 40 mg daily '  10. Seizure disorder, focal motor: is stable no reports of seizure activity: will continue kepprra 500 mg twice daily   11. Anemia of chronic disease: is stable  hgb 9.7 is down from 11.0.     MD is aware of resident's narcotic use and is in agreement with current plan of care. We will attempt to wean resident as apropriate   Ok Edwards NP [facility]  Contact [phone] Monday through Friday 8am- 5pm  After hours call [phone]

## 2018-10-03 ENCOUNTER — Encounter: Payer: Self-pay | Admitting: Adult Health

## 2018-10-03 ENCOUNTER — Non-Acute Institutional Stay (SKILLED_NURSING_FACILITY): Payer: Medicare Other | Admitting: Adult Health

## 2018-10-03 DIAGNOSIS — F482 Pseudobulbar affect: Secondary | ICD-10-CM

## 2018-10-03 DIAGNOSIS — I639 Cerebral infarction, unspecified: Secondary | ICD-10-CM

## 2018-10-03 NOTE — Progress Notes (Signed)
Location:   [facility] Room Number: 143 P Place of Service:  SNF (31)   CODE STATUS: Full Code  Allergies  Allergen Reactions  . Sulfonamide Derivatives Anaphylaxis  . Azithromycin   . Bactrim [Sulfamethoxazole-Trimethoprim] Nausea Only  . Erythromycin Nausea And Vomiting  . Latex Itching and Other (See Comments)    If she wears gloves too long her hands start itching  . Mucinex [Guaifenesin Er] Nausea And Vomiting  . Prednisone Other (See Comments)    Diabetes  . Aspirin Rash  . Levofloxacin Rash  . Tape Rash    Chief Complaint  Patient presents with  . Acute Visit    Pain Management    HPI:  She has vicodin 5/325 mg every 8 hours as needed for pain and colchicine daily as needed. She has not used her colchicine. She has used vicodin one time. She is emotionally inappropriate; she cries without real cause and without tears. She did remind that she cannot move one half of her body. She denies any uncontrolled pain; and states that she did sleep well last night.    Past Medical History:  Diagnosis Date  . 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 Arthritis   . Bronchitis   . CHF (congestive heart failure) ([place])   . Chronic diastolic heart failure ([place]) 07/13/2015  . COPD (chronic obstructive pulmonary disease) ([place])   . Depression   . Diabetes mellitus   . DKA (diabetic ketoacidoses) ([place]) 09/27/2014  . GERD (gastroesophageal reflux disease)   . Headache   . Hyperlipidemia   . Hypertension   . Hypothyroid 10/31/2014  . hypothyroidism   . Pneumonia    hx of   . PONV (postoperative nausea and vomiting)    and slow to wake up after foot surgery   . Seizures ([place])    2017  . Shingles   . Syncope 10/31/2014  . Tubular adenoma of colon   . Vertigo 10/31/2014    Past Surgical History:  Procedure Laterality Date  . ANKLE SURGERY     x 4  . APPENDECTOMY    . CATARACT EXTRACTION Bilateral   . CATARACT EXTRACTION W/  INTRAOCULAR LENS IMPLANT    . CHOLECYSTECTOMY    . COLONOSCOPY W/ BIOPSIES AND POLYPECTOMY    . ESOPHAGOGASTRODUODENOSCOPY    . HARDWARE REMOVAL Left 11/03/2015   Procedure: HARDWARE REMOVAL LEFT FOOT appliction of wound vac;  Surgeon: Gaynelle Arabian, MD;  Location: [facility];  Service: Orthopedics;  Laterality: Left;  . INCISION AND DRAINAGE OF WOUND Left 11/03/2015   Procedure: IRRIGATION AND DEBRIDEMENT LEFT FOOT ULCER;  Surgeon: Gaynelle Arabian, MD;  Location: [facility];  Service: Orthopedics;  Laterality: Left;  . KNEE ARTHROSCOPY    . NASAL SINUS SURGERY    . non cancerous mass removed     small intestine  . oophrectomy    . ORIF CALCANEOUS FRACTURE Left 12/29/2014   Procedure: OPEN REDUCTION INTERNAL FIXATION  LEFT CALCANEOUS FRACTURE;  Surgeon: Gaynelle Arabian, MD;  Location: [facility];  Service: Orthopedics;  Laterality: Left;  . TONSILLECTOMY      Social History   Socioeconomic History  . Marital status: Widowed    Spouse name: Not on file  . Number of children: Not on file  . Years of education: Not on file  . Highest education level: Not on file  Occupational History  . Occupation: Retired-[employer]  Social Needs  . Emergency planning/management officer  strain: Not on file  . Food insecurity:    Worry: Not on file    Inability: Not on file  . Transportation needs:    Medical: Not on file    Non-medical: Not on file  Tobacco Use  . Smoking status: Never Smoker  . Smokeless tobacco: Never Used  Substance and Sexual Activity  . Alcohol use: No  . Drug use: No  . Sexual activity: Not Currently  Lifestyle  . Physical activity:    Days per week: Not on file    Minutes per session: Not on file  . Stress: Not on file  Relationships  . Social connections:    Talks on phone: Not on file    Gets together: Not on file    Attends religious service: Not on file    Active member of club or organization: Not on file    Attends meetings of clubs or organizations: Not on file    Relationship status: Not on  file  . Intimate partner violence:    Fear of current or ex partner: Not on file    Emotionally abused: Not on file    Physically abused: Not on file    Forced sexual activity: Not on file  Other Topics Concern  . Not on file  Social History Narrative   The patient is widowed. She is retired from [employer]. No children.   One or 2 caffeinated beverages daily   She has split time between [place] in the winter and [place] in the [place]   Family History  Problem Relation Age of Onset  . Diabetes Mother   . Asthma Mother   . Hypertension Father   . Heart disease Father   . Diabetes Father   . Heart disease Sister   . Hypertension Sister   . Asthma Sister   . Diabetes Brother   . CAD Brother   . Stroke Brother   . Stomach cancer Maternal Aunt       VITAL SIGNS BP (!) 159/65   Pulse 82   Temp 98.8 F (37.1 C)   Resp 20   Ht 5' 8.5" (1.74 m)   Wt 194 lb (88 kg)   BMI 29.07 kg/m   Outpatient Encounter Medications as of 10/03/2018  Medication Sig  . acetaminophen (TYLENOL) 325 MG tablet Take 2 tablets (650 mg total) by mouth every 4 (four) hours as needed for mild pain (or temp > 37.5 C (99.5 F)).  Marland Kitchen albuterol (PROVENTIL HFA;VENTOLIN HFA) 108 (90 Base) MCG/ACT inhaler Inhale 2 puffs into the lungs every 4 (four) hours as needed for wheezing or shortness of breath.   . Ascorbic Acid (VITAMIN C PO) Take 2 tablets by mouth daily.  Marland Kitchen atorvastatin (LIPITOR) 40 MG tablet Take 1 tablet (40 mg total) by mouth daily at 6 PM.  . busPIRone (BUSPAR) 5 MG tablet Take 5 mg by mouth 2 (two) times daily.  . carvedilol (COREG) 6.25 MG tablet Take 6.25 mg by mouth 2 (two) times daily.  . clopidogrel (PLAVIX) 75 MG tablet Take 1 tablet (75 mg total) by mouth daily.  . colchicine 0.6 MG tablet Take 0.6 mg by mouth daily as needed.  . fluticasone (FLONASE) 50 MCG/ACT nasal spray Place 2 sprays into the nose 2 (two) times daily as needed for allergies.   Marland Kitchen HYDROcodone-acetaminophen (NORCO) 5-325  MG tablet Take 1 tablet by mouth every 8 (eight) hours as needed for up to 7 days for moderate pain.  Marland Kitchen insulin  aspart (NOVOLOG) 100 UNIT/ML injection Give Subcutaneous before meals and at bedtime as per sliding scale:  if blood sugar 201 to 250 give 4 units, if blood sugar is 251-300 give 6 units, If blood sugar is 301- to 350 give 8 units, if blood sugar is 351 to 400 give 10 units, if blood sugar is 401-450 give 12 units, if blood sugar is greater than 450 call MD.  . Insulin Glargine (LANTUS SOLOSTAR) 100 UNIT/ML Solostar Pen Inject 40 Units into the skin daily.   Marland Kitchen levETIRAcetam (KEPPRA) 500 MG tablet Take 1 tablet (500 mg total) by mouth 2 (two) times daily.  Marland Kitchen levothyroxine (SYNTHROID, LEVOTHROID) 125 MCG tablet Take 125 mcg by mouth daily before breakfast.   . LORazepam (ATIVAN) 0.5 MG tablet Take 1 tablet (0.5 mg total) by mouth every 8 (eight) hours as needed for up to 14 days for anxiety.  . NON FORMULARY Diet Type:  NAS, Consistent Carbohydrate  . Ostomy Supplies (SKIN PREP WIPES) MISC Apply to bilateral heels every shift  . pantoprazole (PROTONIX) 20 MG tablet Take 20 mg by mouth daily.  Marland Kitchen PARoxetine (PAXIL) 40 MG tablet Take 40 mg by mouth daily.  . Polyethylene Glycol 400 (BLINK TEARS OP) Place 1 drop into both eyes 4 (four) times daily as needed (Dry eyes).   . potassium chloride (K-DUR) 10 MEQ tablet TAKE 1 TABLET BY MOUTH ONCE DAILY  . quinapril (ACCUPRIL) 40 MG tablet Take 40 mg by mouth daily.   . Senna-Psyllium (PERDIEM PO) Take 1 tablet by mouth daily as needed (For constipation.).   Marland Kitchen torsemide (DEMADEX) 20 MG tablet Take 20 mg by mouth 2 (two) times daily.  . vitamin B-12 (CYANOCOBALAMIN) 1000 MCG tablet Take 1,000 mcg by mouth daily.  . Wound Dressings (ALLEVYN ADHESIVE EX) Apply to bilateral heels daily.  Change every other day and as needed   No facility-administered encounter medications on file as of 10/03/2018.      SIGNIFICANT DIAGNOSTIC EXAMS   PREVIOUS:    09-06-18: ct of head: Acute appearing infarct involving a portion of the right posterior cerebral artery distribution. Specifically, there is cytotoxic edema throughout portions of the medial and posterior right temporal and right occipital lobe with sparing of a portion of the periphery of the right occipital lobe. No other acute appearing infarct evident. No mass or hemorrhage. Foci of arterial vascular calcification noted. Areas of paranasal sinus disease noted.  09-06-18: MRI of head MRA of neck and head:  Large acute to early subacute RIGHT PCA territory infarct affecting the posteromedial temporal lobe, occipital lobe, and RIGHT thalamus. 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 No extracranial stenosis, dissection, or occlusion.   09-07-18: ECHO BUBBLE STUDY:  1. The left ventricle has normal systolic function with an ejection fraction of 60-65%. The cavity size was normal. Left ventricular diastolic Doppler parameters are consistent with impaired relaxation.  2. The right ventricle has normal systolic function. The cavity was normal. There is no increase in right ventricular wall thickness.  3. No evidence present in the left atrial appendage.  4. The mitral valve is degenerative. Mild calcification of the anterior and posterior mitral valve leaflets. There is moderate to severe mitral annular calcification present.  5. The aortic valve is tricuspid. Mild sclerosis of the aortic valve.  6. No pulmonic valve vegetation visualized.  09-08-18: ct of head:  Increasing cytotoxic edema related to the large RIGHT PCA territory infarction. Slight hemorrhagic transformation, most prominent at the RIGHT occipital pole,  but not appreciably worse. No new areas of infarction are identified.  NO NEW EXAMS.   LABS REVIEWED PREVIOUS:   09-06-18: wbc 7.0; hgb 11.1; hct 32.6; mcv 32.6 plt 155; glucose 370; bun 28; creat 1.24; k+ 4.0; na++ 137  09-07-18: hgb a1c 8.5 chol 170; ldl 87; trig  225; hdl 38  09-10-18: wbc 5.4; hgb 10.9; hct 32.9; mcv 85.0; plt 160; glucose 152; bun 31; creat 1.11; k+ 3.5; na+ 141; ca 8.1  09-24-18; wbc 6.4; hgb 9.7; hct 29.1; mcv 83.4 plt 226; glucose 378; bun 33; creat 1.17; k+ 3.9; na++ 136; ca 8.4 alk phos 156; albumin 3.2 total protein 5.9 tsh 1.045  NO NEW LABS.   Review of Systems  Constitutional: Negative for malaise/fatigue.  Respiratory: Negative for cough and shortness of breath.   Cardiovascular: Negative for chest pain, palpitations and leg swelling.  Gastrointestinal: Negative for abdominal pain, constipation and heartburn.  Musculoskeletal: Negative for back pain, joint pain and myalgias.  Skin: Negative.   Neurological: Negative for dizziness.  Psychiatric/Behavioral: The patient is not nervous/anxious.        Cries easily     Physical Exam Constitutional:      General: She is not in acute distress.    Appearance: She is well-developed. She is not diaphoretic.  Eyes:     Comments: Loss of left peripheral vision     Neck:     Musculoskeletal: Neck supple.     Thyroid: No thyromegaly.  Cardiovascular:     Rate and Rhythm: Normal rate and regular rhythm.     Pulses: Normal pulses.     Heart sounds: Normal heart sounds.  Pulmonary:     Effort: Pulmonary effort is normal. No respiratory distress.     Breath sounds: Normal breath sounds.  Abdominal:     General: Bowel sounds are normal. There is no distension.     Palpations: Abdomen is soft.     Tenderness: There is no abdominal tenderness.  Musculoskeletal:     Right lower leg: No edema.     Left lower leg: No edema.     Comments: Has left hemiplegia present    Lymphadenopathy:     Cervical: No cervical adenopathy.  Skin:    General: Skin is warm and dry.  Neurological:     Mental Status: She is alert. Mental status is at baseline.  Psychiatric:     Comments: Is emotionally inappropriate Cries frequently and easily without tears       ASSESSMENT/ PLAN:   TODAY:  1. Acute ischemic stroke 2. PBA (pseudobulbar affect)  Will continue vicodin as needed through Monday Will stop colchicine Will begin nuedexa daily for 7 days then twice daily and will monitor her response.   MD is aware of resident's narcotic use and is in agreement with current plan of care. We will attempt to wean resident as apropriate   Ok Edwards NP [facility]  Contact [phone] Monday through Friday 8am- 5pm  After hours call [phone]

## 2018-10-05 DIAGNOSIS — F482 Pseudobulbar affect: Secondary | ICD-10-CM | POA: Insufficient documentation

## 2018-10-07 ENCOUNTER — Non-Acute Institutional Stay (SKILLED_NURSING_FACILITY): Payer: Medicare Other

## 2018-10-07 DIAGNOSIS — I639 Cerebral infarction, unspecified: Secondary | ICD-10-CM | POA: Diagnosis not present

## 2018-10-07 DIAGNOSIS — L89606 Pressure-induced deep tissue damage of unspecified heel: Secondary | ICD-10-CM

## 2018-10-07 DIAGNOSIS — F482 Pseudobulbar affect: Secondary | ICD-10-CM

## 2018-10-07 NOTE — Progress Notes (Signed)
Location:   [place] Room Number: 143 P Place of Service:  SNF (31)   CODE STATUS: Full Code  Allergies  Allergen Reactions  . Sulfonamide Derivatives Anaphylaxis  . Azithromycin   . Bactrim [Sulfamethoxazole-Trimethoprim] Nausea Only  . Erythromycin Nausea And Vomiting  . Latex Itching and Other (See Comments)    If she wears gloves too long her hands start itching  . Mucinex [Guaifenesin Er] Nausea And Vomiting  . Prednisone Other (See Comments)    Diabetes  . Aspirin Rash  . Levofloxacin Rash  . Tape Rash    Chief Complaint  Patient presents with  . Acute Visit    Family Concerns    HPI:  Her daughter has called and is concerned about her skin integrity . She doe shave bilateral heel deep tissue injuries. She is not having any uncontrolled pain; will need to stop her vicodin. Per her daughter she is seeing cats jumping on her bed nightly; this is not causing her any fear. She is eating; reports of insomnia present. There are no reports of fevers present. 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 Her mood stability is better today; she did not have any crying outbursts today.   Past Medical History:  Diagnosis Date  . 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 Arthritis   . Bronchitis   . CHF (congestive heart failure) ([place])   . Chronic diastolic heart failure ([place]) 07/13/2015  . COPD (chronic obstructive pulmonary disease) ([place])   . Depression   . Diabetes mellitus   . DKA (diabetic ketoacidoses) ([place]) 09/27/2014  . GERD (gastroesophageal reflux disease)   . Headache   . Hyperlipidemia   . Hypertension   . Hypothyroid 10/31/2014  . hypothyroidism   . Pneumonia    hx of   . PONV (postoperative nausea and vomiting)    and slow to wake up after foot surgery   . Seizures ([place])    2017  . Shingles   . Syncope 10/31/2014  . Tubular adenoma of colon   . Vertigo 10/31/2014    Past Surgical History:  Procedure  Laterality Date  . ANKLE SURGERY     x 4  . APPENDECTOMY    . CATARACT EXTRACTION Bilateral   . CATARACT EXTRACTION W/ INTRAOCULAR LENS IMPLANT    . CHOLECYSTECTOMY    . COLONOSCOPY W/ BIOPSIES AND POLYPECTOMY    . ESOPHAGOGASTRODUODENOSCOPY    . HARDWARE REMOVAL Left 11/03/2015   Procedure: HARDWARE REMOVAL LEFT FOOT appliction of wound vac;  Surgeon: Gaynelle Arabian, MD;  Location: [facility];  Service: Orthopedics;  Laterality: Left;  . INCISION AND DRAINAGE OF WOUND Left 11/03/2015   Procedure: IRRIGATION AND DEBRIDEMENT LEFT FOOT ULCER;  Surgeon: Gaynelle Arabian, MD;  Location: [facility];  Service: Orthopedics;  Laterality: Left;  . KNEE ARTHROSCOPY    . NASAL SINUS SURGERY    . non cancerous mass removed     small intestine  . oophrectomy    . ORIF CALCANEOUS FRACTURE Left 12/29/2014   Procedure: OPEN REDUCTION INTERNAL FIXATION  LEFT CALCANEOUS FRACTURE;  Surgeon: Gaynelle Arabian, MD;  Location: [facility];  Service: Orthopedics;  Laterality: Left;  . TONSILLECTOMY      Social History   Socioeconomic History  . Marital status: Widowed    Spouse name: Not on file  . Number of children: Not on file  . Years of education: Not on file  .  Highest education level: Not on file  Occupational History  . Occupation: Retired-[employer]  Social Needs  . Financial resource strain: Not on file  . Food insecurity:    Worry: Not on file    Inability: Not on file  . Transportation needs:    Medical: Not on file    Non-medical: Not on file  Tobacco Use  . Smoking status: Never Smoker  . Smokeless tobacco: Never Used  Substance and Sexual Activity  . Alcohol use: No  . Drug use: No  . Sexual activity: Not Currently  Lifestyle  . Physical activity:    Days per week: Not on file    Minutes per session: Not on file  . Stress: Not on file  Relationships  . Social connections:    Talks on phone: Not on file    Gets together: Not on file    Attends religious service: Not on file    Active member of  club or organization: Not on file    Attends meetings of clubs or organizations: Not on file    Relationship status: Not on file  . Intimate partner violence:    Fear of current or ex partner: Not on file    Emotionally abused: Not on file    Physically abused: Not on file    Forced sexual activity: Not on file  Other Topics Concern  . Not on file  Social History Narrative   The patient is widowed. She is retired from [employer]. No children.   One or 2 caffeinated beverages daily   She has split time between [place] in the winter and [place] in the [place]   Family History  Problem Relation Age of Onset  . Diabetes Mother   . Asthma Mother   . Hypertension Father   . Heart disease Father   . Diabetes Father   . Heart disease Sister   . Hypertension Sister   . Asthma Sister   . Diabetes Brother   . CAD Brother   . Stroke Brother   . Stomach cancer Maternal Aunt       VITAL SIGNS BP (!) 161/47   Pulse 83   Temp 98 F (36.7 C)   Resp 20   Ht 5' 8.5" (1.74 m)   Wt 194 lb (88 kg)   BMI 29.07 kg/m   Outpatient Encounter Medications as of 10/07/2018  Medication Sig  . acetaminophen (TYLENOL) 325 MG tablet Take 2 tablets (650 mg total) by mouth every 4 (four) hours as needed for mild pain (or temp > 37.5 C (99.5 F)).  Marland Kitchen albuterol (PROVENTIL HFA;VENTOLIN HFA) 108 (90 Base) MCG/ACT inhaler Inhale 2 puffs into the lungs every 4 (four) hours as needed for wheezing or shortness of breath.   . Ascorbic Acid (VITAMIN C PO) Take 2 tablets by mouth daily.  Marland Kitchen atorvastatin (LIPITOR) 40 MG tablet Take 1 tablet (40 mg total) by mouth daily at 6 PM.  . busPIRone (BUSPAR) 5 MG tablet Take 5 mg by mouth 3 (three) times daily.   . carvedilol (COREG) 6.25 MG tablet Take 6.25 mg by mouth 2 (two) times daily.  . clopidogrel (PLAVIX) 75 MG tablet Take 1 tablet (75 mg total) by mouth daily.  Marland Kitchen Dextromethorphan-quiNIDine (NUEDEXTA) 20-10 MG capsule Take 1 capsule by mouth daily. Ending 10/11/2018 -  then begin 1 capsule by mouth twice daily on 10/12/2018  . fluticasone (FLONASE) 50 MCG/ACT nasal spray Place 2 sprays into the nose 2 (two) times daily as  needed for allergies.   Marland Kitchen HYDROcodone-acetaminophen (NORCO/VICODIN) 5-325 MG tablet Take 1 tablet by mouth every 8 (eight) hours as needed for moderate pain.  Marland Kitchen insulin aspart (NOVOLOG) 100 UNIT/ML injection Give 8 units subcutaneous with meals in addition to sliding scale for before meals and at bedtime as follows:   if blood sugar 201 to 250 give 4 units, if blood sugar is 251-300 give 6 units, If blood sugar is 301- to 350 give 8 units, if blood sugar is 351 to 400 give 10 units, if blood sugar is 401-450 give 12 units, if blood sugar is greater than 450 call MD.  . Insulin Glargine (LANTUS SOLOSTAR) 100 UNIT/ML Solostar Pen Inject 40 Units into the skin daily.   Marland Kitchen levETIRAcetam (KEPPRA) 500 MG tablet Take 1 tablet (500 mg total) by mouth 2 (two) times daily.  Marland Kitchen levothyroxine (SYNTHROID, LEVOTHROID) 125 MCG tablet Take 125 mcg by mouth daily before breakfast.   . LORazepam (ATIVAN) 0.5 MG tablet Take 1 tablet (0.5 mg total) by mouth every 8 (eight) hours as needed for up to 14 days for anxiety.  . NON FORMULARY Diet Type:  NAS, Consistent Carbohydrate  . Ostomy Supplies (SKIN PREP WIPES) MISC Apply to bilateral heels every shift  . pantoprazole (PROTONIX) 20 MG tablet Take 20 mg by mouth daily.  Marland Kitchen PARoxetine (PAXIL) 40 MG tablet Take 40 mg by mouth daily.  . Polyethylene Glycol 400 (BLINK TEARS OP) Place 1 drop into both eyes 4 (four) times daily as needed (Dry eyes).   . potassium chloride (K-DUR) 10 MEQ tablet TAKE 1 TABLET BY MOUTH ONCE DAILY  . quinapril (ACCUPRIL) 40 MG tablet Take 40 mg by mouth daily.   . Senna-Psyllium (PERDIEM PO) Take 1 tablet by mouth daily as needed (For constipation.).   Marland Kitchen torsemide (DEMADEX) 20 MG tablet Take 20 mg by mouth 2 (two) times daily.  . vitamin B-12 (CYANOCOBALAMIN) 1000 MCG tablet Take 1,000 mcg by  mouth daily.  . Wound Dressings (ALLEVYN ADHESIVE EX) Apply to bilateral heels daily.  Change every other day and as needed  . [DISCONTINUED] colchicine 0.6 MG tablet Take 0.6 mg by mouth daily as needed.   No facility-administered encounter medications on file as of 10/07/2018.      SIGNIFICANT DIAGNOSTIC EXAMS  PREVIOUS:   09-06-18: ct of head: Acute appearing infarct involving a portion of the right posterior cerebral artery distribution. Specifically, there is cytotoxic edema throughout portions of the medial and posterior right temporal and right occipital lobe with sparing of a portion of the periphery of the right occipital lobe. No other acute appearing infarct evident. No mass or hemorrhage. Foci of arterial vascular calcification noted. Areas of paranasal sinus disease noted.  09-06-18: MRI of head MRA of neck and head:  Large acute to early subacute RIGHT PCA territory infarct affecting the posteromedial temporal lobe, occipital lobe, and RIGHT thalamus. 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 No extracranial stenosis, dissection, or occlusion.   09-07-18: ECHO BUBBLE STUDY:  1. The left ventricle has normal systolic function with an ejection fraction of 60-65%. The cavity size was normal. Left ventricular diastolic Doppler parameters are consistent with impaired relaxation.  2. The right ventricle has normal systolic function. The cavity was normal. There is no increase in right ventricular wall thickness.  3. No evidence present in the left atrial appendage.  4. The mitral valve is degenerative. Mild calcification of the anterior and posterior mitral valve leaflets. There is moderate to severe  mitral annular calcification present.  5. The aortic valve is tricuspid. Mild sclerosis of the aortic valve.  6. No pulmonic valve vegetation visualized.  09-08-18: ct of head:  Increasing cytotoxic edema related to the large RIGHT PCA territory infarction. Slight  hemorrhagic transformation, most prominent at the RIGHT occipital pole, but not appreciably worse. No new areas of infarction are identified.  NO NEW EXAMS.   LABS REVIEWED PREVIOUS:   09-06-18: wbc 7.0; hgb 11.1; hct 32.6; mcv 32.6 plt 155; glucose 370; bun 28; creat 1.24; k+ 4.0; na++ 137  09-07-18: hgb a1c 8.5 chol 170; ldl 87; trig 225; hdl 38  09-10-18: wbc 5.4; hgb 10.9; hct 32.9; mcv 85.0; plt 160; glucose 152; bun 31; creat 1.11; k+ 3.5; na+ 141; ca 8.1  09-24-18; wbc 6.4; hgb 9.7; hct 29.1; mcv 83.4 plt 226; glucose 378; bun 33; creat 1.17; k+ 3.9; na++ 136; ca 8.4 alk phos 156; albumin 3.2 total protein 5.9 tsh 1.045  NO NEW LABS.    Review of Systems  Constitutional: Negative for malaise/fatigue.  Respiratory: Negative for cough and shortness of breath.   Cardiovascular: Negative for chest pain, palpitations and leg swelling.  Gastrointestinal: Negative for abdominal pain, constipation and heartburn.  Musculoskeletal: Negative for back pain, joint pain and myalgias.  Skin: Negative.   Neurological: Negative for dizziness.  Psychiatric/Behavioral: The patient is not nervous/anxious.     Physical Exam Constitutional:      General: She is not in acute distress.    Appearance: She is well-developed. She is not diaphoretic.  Eyes:     Comments: Loss of left peripheral vision  Neck:     Musculoskeletal: Neck supple.     Thyroid: No thyromegaly.  Cardiovascular:     Rate and Rhythm: Normal rate and regular rhythm.     Pulses: Normal pulses.     Heart sounds: Normal heart sounds.  Pulmonary:     Effort: Pulmonary effort is normal. No respiratory distress.     Breath sounds: Normal breath sounds.  Abdominal:     General: Bowel sounds are normal. There is no distension.     Palpations: Abdomen is soft.     Tenderness: There is no abdominal tenderness.  Musculoskeletal:     Right lower leg: No edema.     Left lower leg: No edema.     Comments: Left hemiplegia  Is able  to move right extremities   Lymphadenopathy:     Cervical: No cervical adenopathy.  Skin:    General: Skin is warm and dry.     Comments: Bruising and skin tears present  Left heel: DTI:  2 x 2 cm Right heel DTI: 2.5 x 2 cm   Neurological:     Mental Status: She is alert. Mental status is at baseline.  Psychiatric:        Mood and Affect: Mood normal.     ASSESSMENT/ PLAN:  TODAY:  1. Bilateral heel deep tissue injury 2. PBA 3. Acute ischemic stroke  Will stop vicodin Will begin tylenol 1 gm three times daily  I have spoken with her daughter regarding her overall status  Will continue to monitor her status.    MD is aware of resident's narcotic use and is in agreement with current plan of care. We will attempt to wean resident as apropriate   Ok Edwards NP [facility]  Contact [phone] Monday through Friday 8am- 5pm  After hours call [phone]

## 2018-10-08 ENCOUNTER — Encounter: Payer: Self-pay | Admitting: Adult Health

## 2018-10-08 ENCOUNTER — Ambulatory Visit (HOSPITAL_COMMUNITY)
Admission: RE | Admit: 2018-10-08 | Discharge: 2018-10-08 | Disposition: A | Discharge status: Home / Self Care | Payer: Medicare Other | Source: Ambulatory Visit | Attending: Internal Medicine | Admitting: Internal Medicine

## 2018-10-08 ENCOUNTER — Non-Acute Institutional Stay (SKILLED_NURSING_FACILITY): Payer: Medicare Other | Admitting: Adult Health

## 2018-10-08 ENCOUNTER — Encounter (HOSPITAL_COMMUNITY)
Admission: RE | Admit: 2018-10-08 | Discharge: 2018-10-08 | Disposition: A | Discharge status: Home / Self Care | Payer: Medicare Other | Source: Skilled Nursing Facility | Attending: Internal Medicine | Admitting: Internal Medicine

## 2018-10-08 ENCOUNTER — Inpatient Hospital Stay (HOSPITAL_COMMUNITY)
Admit: 2018-10-08 | Discharge: 2018-10-08 | Disposition: A | Discharge status: Home / Self Care | Payer: Medicare Other | Attending: Adult Health | Admitting: Adult Health

## 2018-10-08 DIAGNOSIS — F03918 Unspecified dementia, unspecified severity, with other behavioral disturbance: Secondary | ICD-10-CM

## 2018-10-08 DIAGNOSIS — F0391 Unspecified dementia with behavioral disturbance: Secondary | ICD-10-CM

## 2018-10-08 DIAGNOSIS — I639 Cerebral infarction, unspecified: Secondary | ICD-10-CM

## 2018-10-08 DIAGNOSIS — L89606 Pressure-induced deep tissue damage of unspecified heel: Secondary | ICD-10-CM | POA: Insufficient documentation

## 2018-10-08 DIAGNOSIS — R05 Cough: Secondary | ICD-10-CM | POA: Insufficient documentation

## 2018-10-08 DIAGNOSIS — F22 Delusional disorders: Secondary | ICD-10-CM | POA: Diagnosis not present

## 2018-10-08 LAB — RAPID URINE DRUG SCREEN, HOSP PERFORMED
Amphetamines: NOT DETECTED
Barbiturates: NOT DETECTED
Benzodiazepines: NOT DETECTED
Cocaine: NOT DETECTED
Opiates: POSITIVE — AB
Tetrahydrocannabinol: NOT DETECTED

## 2018-10-08 LAB — COMPREHENSIVE METABOLIC PANEL
ALT: 10 U/L (ref 0–44)
AST: 18 U/L (ref 15–41)
Albumin: 3.2 g/dL — ABNORMAL LOW (ref 3.5–5.0)
Alkaline Phosphatase: 140 U/L — ABNORMAL HIGH (ref 38–126)
Anion gap: 12 (ref 5–15)
BUN: 26 mg/dL — ABNORMAL HIGH (ref 8–23)
CO2: 24 mmol/L (ref 22–32)
Calcium: 8.2 mg/dL — ABNORMAL LOW (ref 8.9–10.3)
Chloride: 104 mmol/L (ref 98–111)
Creatinine, Ser: 1.44 mg/dL — ABNORMAL HIGH (ref 0.44–1.00)
GFR calc Af Amer: 42 mL/min — ABNORMAL LOW (ref >60)
GFR calc non Af Amer: 36 mL/min — ABNORMAL LOW (ref >60)
Glucose, Bld: 325 mg/dL — ABNORMAL HIGH (ref 70–99)
Potassium: 4.1 mmol/L (ref 3.5–5.1)
Sodium: 140 mmol/L (ref 135–145)
Total Bilirubin: 0.7 mg/dL (ref 0.3–1.2)
Total Protein: 6.2 g/dL — ABNORMAL LOW (ref 6.5–8.1)

## 2018-10-08 LAB — CBC WITH DIFFERENTIAL/PLATELET
Abs Immature Granulocytes: 0.03 10*3/uL (ref 0.00–0.07)
Basophils Absolute: 0 10*3/uL (ref 0.0–0.1)
Basophils Relative: 0 %
Eosinophils Absolute: 0.1 10*3/uL (ref 0.0–0.5)
Eosinophils Relative: 2 %
HCT: 27.8 % — ABNORMAL LOW (ref 36.0–46.0)
Hemoglobin: 9.2 g/dL — ABNORMAL LOW (ref 12.0–15.0)
Immature Granulocytes: 1 %
Lymphocytes Relative: 12 %
Lymphs Abs: 0.8 10*3/uL (ref 0.7–4.0)
MCH: 28.3 pg (ref 26.0–34.0)
MCHC: 33.1 g/dL (ref 30.0–36.0)
MCV: 85.5 fL (ref 80.0–100.0)
Monocytes Absolute: 0.5 10*3/uL (ref 0.1–1.0)
Monocytes Relative: 7 %
Neutro Abs: 5.2 10*3/uL (ref 1.7–7.7)
Neutrophils Relative %: 78 %
Platelets: 230 10*3/uL (ref 150–400)
RBC: 3.25 MIL/uL — ABNORMAL LOW (ref 3.87–5.11)
RDW: 14.4 % (ref 11.5–15.5)
WBC: 6.6 10*3/uL (ref 4.0–10.5)
nRBC: 0 % (ref 0.0–0.2)

## 2018-10-08 LAB — URINALYSIS, ROUTINE W REFLEX MICROSCOPIC
Bilirubin Urine: NEGATIVE
Glucose, UA: NEGATIVE mg/dL
Hgb urine dipstick: NEGATIVE
Ketones, ur: NEGATIVE mg/dL
Leukocytes,Ua: NEGATIVE
Nitrite: NEGATIVE
Protein, ur: NEGATIVE mg/dL
Specific Gravity, Urine: 1.012 (ref 1.005–1.030)
pH: 5 (ref 5.0–8.0)

## 2018-10-08 LAB — ETHANOL: Alcohol, Ethyl (B): <10 mg/dL (ref <10)
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
## 2018-10-09 ENCOUNTER — Ambulatory Visit (HOSPITAL_COMMUNITY): Payer: Medicare Other

## 2018-10-09 ENCOUNTER — Encounter: Payer: Self-pay | Admitting: Adult Health

## 2018-10-09 ENCOUNTER — Non-Acute Institutional Stay (SKILLED_NURSING_FACILITY): Payer: Medicare Other | Admitting: Adult Health

## 2018-10-09 DIAGNOSIS — F0391 Unspecified dementia with behavioral disturbance: Secondary | ICD-10-CM

## 2018-10-09 DIAGNOSIS — I639 Cerebral infarction, unspecified: Secondary | ICD-10-CM

## 2018-10-09 DIAGNOSIS — F03918 Unspecified dementia, unspecified severity, with other behavioral disturbance: Secondary | ICD-10-CM

## 2018-10-09 DIAGNOSIS — F22 Delusional disorders: Secondary | ICD-10-CM

## 2018-10-09 LAB — URINE CULTURE

## 2018-10-09 NOTE — Progress Notes (Signed)
Location:   [place] Room Number: 143 P Place of Service:  SNF (31)   CODE STATUS: Full Code  Allergies  Allergen Reactions  . Sulfonamide Derivatives Anaphylaxis  . Azithromycin   . Bactrim [Sulfamethoxazole-Trimethoprim] Nausea Only  . Erythromycin Nausea And Vomiting  . Latex Itching and Other (See Comments)    If she wears gloves too long her hands start itching  . Mucinex [Guaifenesin Er] Nausea And Vomiting  . Prednisone Other (See Comments)    Diabetes  . Aspirin Rash  . Levofloxacin Rash  . Tape Rash    Chief Complaint  Patient presents with  . Acute Visit    Involuntary Admission    HPI:  She has been accepted to [facility]. She continues to have delusions; paranoia; fear present. She is not sleeping; is wanting to die. It will require involuntary admission. After speaking with the MD in the ED; will go through the magistrate.   Past Medical History:  Diagnosis Date  . 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 Arthritis   . Bronchitis   . CHF (congestive heart failure) ([place])   . Chronic diastolic heart failure ([place]) 07/13/2015  . COPD (chronic obstructive pulmonary disease) ([place])   . Depression   . Diabetes mellitus   . DKA (diabetic ketoacidoses) ([place]) 09/27/2014  . GERD (gastroesophageal reflux disease)   . Headache   . Hyperlipidemia   . Hypertension   . Hypothyroid 10/31/2014  . hypothyroidism   . Pneumonia    hx of   . PONV (postoperative nausea and vomiting)    and slow to wake up after foot surgery   . Seizures ([place])    2017  . Shingles   . Syncope 10/31/2014  . Tubular adenoma of colon   . Vertigo 10/31/2014    Past Surgical History:  Procedure Laterality Date  . ANKLE SURGERY     x 4  . APPENDECTOMY    . CATARACT EXTRACTION Bilateral   . CATARACT EXTRACTION W/ INTRAOCULAR LENS IMPLANT    . CHOLECYSTECTOMY    . COLONOSCOPY W/ BIOPSIES AND POLYPECTOMY    .  ESOPHAGOGASTRODUODENOSCOPY    . HARDWARE REMOVAL Left 11/03/2015   Procedure: HARDWARE REMOVAL LEFT FOOT appliction of wound vac;  Surgeon: Gaynelle Arabian, MD;  Location: [facility];  Service: Orthopedics;  Laterality: Left;  . INCISION AND DRAINAGE OF WOUND Left 11/03/2015   Procedure: IRRIGATION AND DEBRIDEMENT LEFT FOOT ULCER;  Surgeon: Gaynelle Arabian, MD;  Location: [facility];  Service: Orthopedics;  Laterality: Left;  . KNEE ARTHROSCOPY    . NASAL SINUS SURGERY    . non cancerous mass removed     small intestine  . oophrectomy    . ORIF CALCANEOUS FRACTURE Left 12/29/2014   Procedure: OPEN REDUCTION INTERNAL FIXATION  LEFT CALCANEOUS FRACTURE;  Surgeon: Gaynelle Arabian, MD;  Location: [facility];  Service: Orthopedics;  Laterality: Left;  . TONSILLECTOMY      Social History   Socioeconomic History  . Marital status: Widowed    Spouse name: Not on file  . Number of children: Not on file  . Years of education: Not on file  . Highest education level: Not on file  Occupational History  . Occupation: Retired-[employer]  Social Needs  . Financial resource strain: Not on file  . Food insecurity:    Worry: Not on file    Inability: Not on file  . Transportation needs:  Medical: Not on file    Non-medical: Not on file  Tobacco Use  . Smoking status: Never Smoker  . Smokeless tobacco: Never Used  Substance and Sexual Activity  . Alcohol use: No  . Drug use: No  . Sexual activity: Not Currently  Lifestyle  . Physical activity:    Days per week: Not on file    Minutes per session: Not on file  . Stress: Not on file  Relationships  . Social connections:    Talks on phone: Not on file    Gets together: Not on file    Attends religious service: Not on file    Active member of club or organization: Not on file    Attends meetings of clubs or organizations: Not on file    Relationship status: Not on file  . Intimate partner violence:    Fear of current or ex partner: Not on file    Emotionally  abused: Not on file    Physically abused: Not on file    Forced sexual activity: Not on file  Other Topics Concern  . Not on file  Social History Narrative   The patient is widowed. She is retired from [employer]. No children.   One or 2 caffeinated beverages daily   She has split time between [place] in the winter and [place] in the [place]   Family History  Problem Relation Age of Onset  . Diabetes Mother   . Asthma Mother   . Hypertension Father   . Heart disease Father   . Diabetes Father   . Heart disease Sister   . Hypertension Sister   . Asthma Sister   . Diabetes Brother   . CAD Brother   . Stroke Brother   . Stomach cancer Maternal Aunt       VITAL SIGNS BP (!) 156/50   Pulse 74   Temp 97.8 F (36.6 C)   Resp 20   Ht 5' 8.5" (1.74 m)   Wt 194 lb 9.6 oz (88.3 kg)   BMI 29.16 kg/m   Outpatient Encounter Medications as of 10/09/2018  Medication Sig  . acetaminophen (TYLENOL) 500 MG tablet Take 1,000 mg by mouth 3 (three) times daily.  Marland Kitchen albuterol (PROVENTIL HFA;VENTOLIN HFA) 108 (90 Base) MCG/ACT inhaler Inhale 2 puffs into the lungs every 4 (four) hours as needed for wheezing or shortness of breath.   . ARIPiprazole (ABILIFY) 5 MG tablet Take 5 mg by mouth daily.  . Ascorbic Acid (VITAMIN C PO) Take 2 tablets by mouth daily.  Marland Kitchen atorvastatin (LIPITOR) 40 MG tablet Take 1 tablet (40 mg total) by mouth daily at 6 PM.  . busPIRone (BUSPAR) 5 MG tablet Take 5 mg by mouth 3 (three) times daily.   . carvedilol (COREG) 6.25 MG tablet Take 6.25 mg by mouth 2 (two) times daily.  . clopidogrel (PLAVIX) 75 MG tablet Take 1 tablet (75 mg total) by mouth daily.  . fluticasone (FLONASE) 50 MCG/ACT nasal spray Place 2 sprays into the nose 2 (two) times daily as needed for allergies.   Marland Kitchen insulin aspart (NOVOLOG) 100 UNIT/ML injection Give 8 units subcutaneous with meals in addition to sliding scale for before meals and at bedtime as follows:   if blood sugar 201 to 250 give 4  units, if blood sugar is 251-300 give 6 units, If blood sugar is 301- to 350 give 8 units, if blood sugar is 351 to 400 give 10 units, if blood sugar is 401-450  give 12 units, if blood sugar is greater than 450 call MD.  . Insulin Glargine (LANTUS SOLOSTAR) 100 UNIT/ML Solostar Pen Inject 40 Units into the skin daily.   Marland Kitchen levETIRAcetam (KEPPRA) 500 MG tablet Take 1 tablet (500 mg total) by mouth 2 (two) times daily.  Marland Kitchen levothyroxine (SYNTHROID, LEVOTHROID) 125 MCG tablet Take 125 mcg by mouth daily before breakfast.   . LORazepam (ATIVAN) 0.5 MG tablet Take 1 tablet (0.5 mg total) by mouth every 8 (eight) hours as needed for up to 14 days for anxiety.  . NON FORMULARY Diet Type:  NAS, Consistent Carbohydrate  . Ostomy Supplies (SKIN PREP WIPES) MISC Apply to bilateral heels every shift  . pantoprazole (PROTONIX) 20 MG tablet Take 20 mg by mouth daily.  Marland Kitchen PARoxetine (PAXIL) 40 MG tablet Take 40 mg by mouth daily.  . Polyethylene Glycol 400 (BLINK TEARS OP) Place 1 drop into both eyes 4 (four) times daily as needed (Dry eyes).   . potassium chloride (K-DUR) 10 MEQ tablet TAKE 1 TABLET BY MOUTH ONCE DAILY  . quinapril (ACCUPRIL) 40 MG tablet Take 40 mg by mouth daily.   . Senna-Psyllium (PERDIEM PO) Take 1 tablet by mouth daily as needed (For constipation.).   Marland Kitchen torsemide (DEMADEX) 20 MG tablet Take 20 mg by mouth 2 (two) times daily.  Marland Kitchen UNABLE TO FIND Cleanse skin tears to left hand and arm with normal saline.  Apply Xerofoam gauze and wrap in kling. Change every other day and as needed until resolved  . vitamin B-12 (CYANOCOBALAMIN) 1000 MCG tablet Take 1,000 mcg by mouth daily.  . Wound Dressings (ALLEVYN ADHESIVE EX) Apply to bilateral heels daily.  Change every other day and as needed  . [DISCONTINUED] Dextromethorphan-quiNIDine (NUEDEXTA) 20-10 MG capsule Take 1 capsule by mouth daily. Ending 10/11/2018 - then begin 1 capsule by mouth twice daily on 10/12/2018   No facility-administered encounter  medications on file as of 10/09/2018.      SIGNIFICANT DIAGNOSTIC EXAMS  PREVIOUS:   09-06-18: ct of head: Acute appearing infarct involving a portion of the right posterior cerebral artery distribution. Specifically, there is cytotoxic edema throughout portions of the medial and posterior right temporal and right occipital lobe with sparing of a portion of the periphery of the right occipital lobe. No other acute appearing infarct evident. No mass or hemorrhage. Foci of arterial vascular calcification noted. Areas of paranasal sinus disease noted.  09-06-18: MRI of head MRA of neck and head:  Large acute to early subacute RIGHT PCA territory infarct affecting the posteromedial temporal lobe, occipital lobe, and RIGHT thalamus. 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 No extracranial stenosis, dissection, or occlusion.   09-07-18: ECHO BUBBLE STUDY:  1. The left ventricle has normal systolic function with an ejection fraction of 60-65%. The cavity size was normal. Left ventricular diastolic Doppler parameters are consistent with impaired relaxation.  2. The right ventricle has normal systolic function. The cavity was normal. There is no increase in right ventricular wall thickness.  3. No evidence present in the left atrial appendage.  4. The mitral valve is degenerative. Mild calcification of the anterior and posterior mitral valve leaflets. There is moderate to severe mitral annular calcification present.  5. The aortic valve is tricuspid. Mild sclerosis of the aortic valve.  6. No pulmonic valve vegetation visualized.  09-08-18: ct of head:  Increasing cytotoxic edema related to the large RIGHT PCA territory infarction. Slight hemorrhagic transformation, most prominent at the RIGHT occipital pole,  but not appreciably worse. No new areas of infarction are identified.  NO NEW EXAMS.   LABS REVIEWED PREVIOUS:   09-06-18: wbc 7.0; hgb 11.1; hct 32.6; mcv 32.6 plt 155;  glucose 370; bun 28; creat 1.24; k+ 4.0; na++ 137  09-07-18: hgb a1c 8.5 chol 170; ldl 87; trig 225; hdl 38  09-10-18: wbc 5.4; hgb 10.9; hct 32.9; mcv 85.0; plt 160; glucose 152; bun 31; creat 1.11; k+ 3.5; na+ 141; ca 8.1  09-24-18; wbc 6.4; hgb 9.7; hct 29.1; mcv 83.4 plt 226; glucose 378; bun 33; creat 1.17; k+ 3.9; na++ 136; ca 8.4 alk phos 156; albumin 3.2 total protein 5.9 tsh 1.045  NO NEW LABS.    Review of Systems  Reason unable to perform ROS: is delusiona; paranoid and wants to die      Physical Exam Constitutional:      General: She is not in acute distress.    Appearance: She is obese. She is not diaphoretic.  Eyes:     Comments: Loss of left peripheral vision    Neck:     Musculoskeletal: Neck supple.     Thyroid: No thyromegaly.  Cardiovascular:     Rate and Rhythm: Normal rate and regular rhythm.     Pulses: Normal pulses.     Heart sounds: Normal heart sounds.  Pulmonary:     Effort: Pulmonary effort is normal. No respiratory distress.     Breath sounds: Normal breath sounds.  Abdominal:     General: Bowel sounds are normal. There is no distension.     Palpations: Abdomen is soft.     Tenderness: There is no abdominal tenderness.  Musculoskeletal:     Right lower leg: No edema.     Left lower leg: No edema.     Comments: Left hemiplegia  Is able to move right extremities    Lymphadenopathy:     Cervical: No cervical adenopathy.  Skin:    General: Skin is warm and dry.     Comments: Bruising and skin tears present  Left heel: DTI:  2 x 2 cm Right heel DTI: 2.5 x 2 cm   Neurological:     Mental Status: She is alert. She is disoriented.  Psychiatric:     Comments: Tearful; fearful; delusional; crying       ASSESSMENT/ PLAN:  TODAY:  1.delusion 2. psychosis  3. Acute ischemic stroke  Will admit [facility] to [facility]    MD is aware of resident's narcotic use and is in agreement with current plan of care. We will attempt to wean resident as  apropriate   Ok Edwards NP [facility]  Contact [phone] Monday through Friday 8am- 5pm  After hours call [phone]

## 2018-10-10 DIAGNOSIS — E1149 Type 2 diabetes mellitus with other diabetic neurological complication: Secondary | ICD-10-CM | POA: Diagnosis not present

## 2018-10-10 DIAGNOSIS — M25552 Pain in left hip: Secondary | ICD-10-CM | POA: Diagnosis not present

## 2018-10-10 DIAGNOSIS — J449 Chronic obstructive pulmonary disease, unspecified: Secondary | ICD-10-CM | POA: Diagnosis not present

## 2018-10-10 DIAGNOSIS — N179 Acute kidney failure, unspecified: Secondary | ICD-10-CM | POA: Diagnosis not present

## 2018-10-10 DIAGNOSIS — I69354 Hemiplegia and hemiparesis following cerebral infarction affecting left non-dominant side: Secondary | ICD-10-CM | POA: Diagnosis not present

## 2018-10-10 DIAGNOSIS — E1165 Type 2 diabetes mellitus with hyperglycemia: Secondary | ICD-10-CM | POA: Diagnosis not present

## 2018-10-12 DIAGNOSIS — E1149 Type 2 diabetes mellitus with other diabetic neurological complication: Secondary | ICD-10-CM | POA: Diagnosis not present

## 2018-10-12 DIAGNOSIS — J449 Chronic obstructive pulmonary disease, unspecified: Secondary | ICD-10-CM | POA: Diagnosis not present

## 2018-10-12 DIAGNOSIS — I69354 Hemiplegia and hemiparesis following cerebral infarction affecting left non-dominant side: Secondary | ICD-10-CM | POA: Diagnosis not present

## 2018-10-12 DIAGNOSIS — E1165 Type 2 diabetes mellitus with hyperglycemia: Secondary | ICD-10-CM | POA: Diagnosis not present

## 2018-10-12 DIAGNOSIS — N179 Acute kidney failure, unspecified: Secondary | ICD-10-CM | POA: Diagnosis not present

## 2018-10-13 DIAGNOSIS — E1165 Type 2 diabetes mellitus with hyperglycemia: Secondary | ICD-10-CM | POA: Diagnosis not present

## 2018-10-13 DIAGNOSIS — J449 Chronic obstructive pulmonary disease, unspecified: Secondary | ICD-10-CM | POA: Diagnosis not present

## 2018-10-13 DIAGNOSIS — I69354 Hemiplegia and hemiparesis following cerebral infarction affecting left non-dominant side: Secondary | ICD-10-CM | POA: Diagnosis not present

## 2018-10-13 DIAGNOSIS — N179 Acute kidney failure, unspecified: Secondary | ICD-10-CM | POA: Diagnosis not present

## 2018-10-13 DIAGNOSIS — E1149 Type 2 diabetes mellitus with other diabetic neurological complication: Secondary | ICD-10-CM | POA: Diagnosis not present

## 2018-10-14 DIAGNOSIS — I69354 Hemiplegia and hemiparesis following cerebral infarction affecting left non-dominant side: Secondary | ICD-10-CM | POA: Diagnosis not present

## 2018-10-14 DIAGNOSIS — E1149 Type 2 diabetes mellitus with other diabetic neurological complication: Secondary | ICD-10-CM | POA: Diagnosis not present

## 2018-10-14 DIAGNOSIS — E1165 Type 2 diabetes mellitus with hyperglycemia: Secondary | ICD-10-CM | POA: Diagnosis not present

## 2018-10-14 DIAGNOSIS — J449 Chronic obstructive pulmonary disease, unspecified: Secondary | ICD-10-CM | POA: Diagnosis not present

## 2018-10-14 DIAGNOSIS — N179 Acute kidney failure, unspecified: Secondary | ICD-10-CM | POA: Diagnosis not present

## 2018-10-15 ENCOUNTER — Encounter (HOSPITAL_COMMUNITY)
Admission: RE | Admit: 2018-10-15 | Discharge: 2018-10-15 | Disposition: A | Discharge status: Home / Self Care | Payer: Medicare Other | Source: Skilled Nursing Facility | Attending: Internal Medicine | Admitting: Internal Medicine

## 2018-10-15 DIAGNOSIS — I63331 Cerebral infarction due to thrombosis of right posterior cerebral artery: Secondary | ICD-10-CM | POA: Insufficient documentation

## 2018-10-15 DIAGNOSIS — G40109 Localization-related (focal) (partial) symptomatic epilepsy and epileptic syndromes with simple partial seizures, not intractable, without status epilepticus: Secondary | ICD-10-CM | POA: Insufficient documentation

## 2018-10-15 DIAGNOSIS — F419 Anxiety disorder, unspecified: Secondary | ICD-10-CM | POA: Insufficient documentation

## 2018-10-16 DIAGNOSIS — J449 Chronic obstructive pulmonary disease, unspecified: Secondary | ICD-10-CM | POA: Diagnosis not present

## 2018-10-16 DIAGNOSIS — G8194 Hemiplegia, unspecified affecting left nondominant side: Secondary | ICD-10-CM | POA: Diagnosis not present

## 2018-10-16 DIAGNOSIS — N179 Acute kidney failure, unspecified: Secondary | ICD-10-CM | POA: Diagnosis not present

## 2018-10-16 DIAGNOSIS — I5032 Chronic diastolic (congestive) heart failure: Secondary | ICD-10-CM | POA: Diagnosis not present

## 2018-10-17 DIAGNOSIS — I5032 Chronic diastolic (congestive) heart failure: Secondary | ICD-10-CM | POA: Diagnosis not present

## 2018-10-17 DIAGNOSIS — N179 Acute kidney failure, unspecified: Secondary | ICD-10-CM | POA: Diagnosis not present

## 2018-10-17 DIAGNOSIS — J449 Chronic obstructive pulmonary disease, unspecified: Secondary | ICD-10-CM | POA: Diagnosis not present

## 2018-10-17 DIAGNOSIS — G8194 Hemiplegia, unspecified affecting left nondominant side: Secondary | ICD-10-CM | POA: Diagnosis not present

## 2018-10-18 DIAGNOSIS — J449 Chronic obstructive pulmonary disease, unspecified: Secondary | ICD-10-CM | POA: Diagnosis not present

## 2018-10-18 DIAGNOSIS — G8194 Hemiplegia, unspecified affecting left nondominant side: Secondary | ICD-10-CM | POA: Diagnosis not present

## 2018-10-18 DIAGNOSIS — N179 Acute kidney failure, unspecified: Secondary | ICD-10-CM | POA: Diagnosis not present

## 2018-10-18 DIAGNOSIS — I5032 Chronic diastolic (congestive) heart failure: Secondary | ICD-10-CM | POA: Diagnosis not present

## 2018-10-20 DIAGNOSIS — G8194 Hemiplegia, unspecified affecting left nondominant side: Secondary | ICD-10-CM | POA: Diagnosis not present

## 2018-10-20 DIAGNOSIS — I5032 Chronic diastolic (congestive) heart failure: Secondary | ICD-10-CM | POA: Diagnosis not present

## 2018-10-20 DIAGNOSIS — J449 Chronic obstructive pulmonary disease, unspecified: Secondary | ICD-10-CM | POA: Diagnosis not present

## 2018-10-20 DIAGNOSIS — N179 Acute kidney failure, unspecified: Secondary | ICD-10-CM | POA: Diagnosis not present

## 2018-10-21 DIAGNOSIS — M25552 Pain in left hip: Secondary | ICD-10-CM | POA: Diagnosis not present

## 2018-10-21 DIAGNOSIS — N179 Acute kidney failure, unspecified: Secondary | ICD-10-CM | POA: Diagnosis not present

## 2018-10-21 DIAGNOSIS — J449 Chronic obstructive pulmonary disease, unspecified: Secondary | ICD-10-CM | POA: Diagnosis not present

## 2018-10-21 DIAGNOSIS — G8194 Hemiplegia, unspecified affecting left nondominant side: Secondary | ICD-10-CM | POA: Diagnosis not present

## 2018-10-21 DIAGNOSIS — I5032 Chronic diastolic (congestive) heart failure: Secondary | ICD-10-CM | POA: Diagnosis not present

## 2018-10-24 DIAGNOSIS — I5032 Chronic diastolic (congestive) heart failure: Secondary | ICD-10-CM | POA: Diagnosis not present

## 2018-10-24 DIAGNOSIS — G8194 Hemiplegia, unspecified affecting left nondominant side: Secondary | ICD-10-CM | POA: Diagnosis not present

## 2018-10-24 DIAGNOSIS — N179 Acute kidney failure, unspecified: Secondary | ICD-10-CM | POA: Diagnosis not present

## 2018-10-24 DIAGNOSIS — J449 Chronic obstructive pulmonary disease, unspecified: Secondary | ICD-10-CM | POA: Diagnosis not present

## 2018-10-25 DIAGNOSIS — G8194 Hemiplegia, unspecified affecting left nondominant side: Secondary | ICD-10-CM | POA: Diagnosis not present

## 2018-10-25 DIAGNOSIS — N179 Acute kidney failure, unspecified: Secondary | ICD-10-CM | POA: Diagnosis not present

## 2018-10-25 DIAGNOSIS — I5032 Chronic diastolic (congestive) heart failure: Secondary | ICD-10-CM | POA: Diagnosis not present

## 2018-10-25 DIAGNOSIS — J449 Chronic obstructive pulmonary disease, unspecified: Secondary | ICD-10-CM | POA: Diagnosis not present

## 2018-10-27 DIAGNOSIS — E1159 Type 2 diabetes mellitus with other circulatory complications: Secondary | ICD-10-CM | POA: Diagnosis not present

## 2018-10-27 DIAGNOSIS — E1149 Type 2 diabetes mellitus with other diabetic neurological complication: Secondary | ICD-10-CM | POA: Diagnosis not present

## 2018-10-27 DIAGNOSIS — D638 Anemia in other chronic diseases classified elsewhere: Secondary | ICD-10-CM | POA: Diagnosis not present

## 2018-10-27 DIAGNOSIS — I5032 Chronic diastolic (congestive) heart failure: Secondary | ICD-10-CM | POA: Diagnosis not present

## 2018-10-27 DIAGNOSIS — N179 Acute kidney failure, unspecified: Secondary | ICD-10-CM | POA: Diagnosis not present

## 2018-10-28 DIAGNOSIS — I6389 Other cerebral infarction: Secondary | ICD-10-CM | POA: Diagnosis not present

## 2018-10-28 DIAGNOSIS — R569 Unspecified convulsions: Secondary | ICD-10-CM | POA: Diagnosis not present

## 2018-10-28 DIAGNOSIS — G8194 Hemiplegia, unspecified affecting left nondominant side: Secondary | ICD-10-CM | POA: Diagnosis not present

## 2018-10-28 DIAGNOSIS — J449 Chronic obstructive pulmonary disease, unspecified: Secondary | ICD-10-CM | POA: Diagnosis not present

## 2018-10-28 DIAGNOSIS — N179 Acute kidney failure, unspecified: Secondary | ICD-10-CM | POA: Diagnosis not present

## 2018-10-28 DIAGNOSIS — I5032 Chronic diastolic (congestive) heart failure: Secondary | ICD-10-CM | POA: Diagnosis not present

## 2018-10-28 DIAGNOSIS — I63331 Cerebral infarction due to thrombosis of right posterior cerebral artery: Secondary | ICD-10-CM | POA: Diagnosis not present

## 2018-10-29 ENCOUNTER — Other Ambulatory Visit (HOSPITAL_COMMUNITY): Payer: Self-pay | Admitting: Otolaryngology

## 2018-10-29 ENCOUNTER — Other Ambulatory Visit: Payer: Self-pay | Admitting: Otolaryngology

## 2018-10-29 ENCOUNTER — Telehealth: Payer: Self-pay

## 2018-10-29 DIAGNOSIS — I69354 Hemiplegia and hemiparesis following cerebral infarction affecting left non-dominant side: Secondary | ICD-10-CM | POA: Diagnosis not present

## 2018-10-29 DIAGNOSIS — I5032 Chronic diastolic (congestive) heart failure: Secondary | ICD-10-CM | POA: Diagnosis not present

## 2018-10-29 DIAGNOSIS — E1165 Type 2 diabetes mellitus with hyperglycemia: Secondary | ICD-10-CM | POA: Diagnosis not present

## 2018-10-29 DIAGNOSIS — N179 Acute kidney failure, unspecified: Secondary | ICD-10-CM | POA: Diagnosis not present

## 2018-10-29 DIAGNOSIS — E041 Nontoxic single thyroid nodule: Secondary | ICD-10-CM

## 2018-10-29 DIAGNOSIS — J449 Chronic obstructive pulmonary disease, unspecified: Secondary | ICD-10-CM | POA: Diagnosis not present

## 2018-10-29 DIAGNOSIS — I63331 Cerebral infarction due to thrombosis of right posterior cerebral artery: Secondary | ICD-10-CM | POA: Diagnosis not present

## 2018-10-29 NOTE — Telephone Encounter (Signed)
I attempted to reach at # [phone] and left a vm requesting her to call me back so we could discuss 10/30/18 appt. I reviewed the original scheduling notes from 09/11/18 and appt was confirmed with a Lattie Haw from [facility] in [place]. I reached out to the [facility] and was advised by admissions Pt had been discharged on 10/09/18 to the hospital.  I have removed pt from 5/20 schedule. Will wait for pt to return call to schedule.

## 2018-10-30 ENCOUNTER — Ambulatory Visit: Payer: Medicare Other | Admitting: Neurology

## 2018-10-30 DIAGNOSIS — E1165 Type 2 diabetes mellitus with hyperglycemia: Secondary | ICD-10-CM | POA: Diagnosis not present

## 2018-10-30 DIAGNOSIS — I69354 Hemiplegia and hemiparesis following cerebral infarction affecting left non-dominant side: Secondary | ICD-10-CM | POA: Diagnosis not present

## 2018-10-30 DIAGNOSIS — J449 Chronic obstructive pulmonary disease, unspecified: Secondary | ICD-10-CM | POA: Diagnosis not present

## 2018-10-30 DIAGNOSIS — I5032 Chronic diastolic (congestive) heart failure: Secondary | ICD-10-CM | POA: Diagnosis not present

## 2018-10-30 DIAGNOSIS — N179 Acute kidney failure, unspecified: Secondary | ICD-10-CM | POA: Diagnosis not present

## 2018-10-31 DIAGNOSIS — G8194 Hemiplegia, unspecified affecting left nondominant side: Secondary | ICD-10-CM | POA: Diagnosis not present

## 2018-10-31 DIAGNOSIS — J449 Chronic obstructive pulmonary disease, unspecified: Secondary | ICD-10-CM | POA: Diagnosis not present

## 2018-10-31 DIAGNOSIS — I5032 Chronic diastolic (congestive) heart failure: Secondary | ICD-10-CM | POA: Diagnosis not present

## 2018-10-31 DIAGNOSIS — E1149 Type 2 diabetes mellitus with other diabetic neurological complication: Secondary | ICD-10-CM | POA: Diagnosis not present

## 2018-10-31 DIAGNOSIS — N179 Acute kidney failure, unspecified: Secondary | ICD-10-CM | POA: Diagnosis not present

## 2018-11-01 DIAGNOSIS — N179 Acute kidney failure, unspecified: Secondary | ICD-10-CM | POA: Diagnosis not present

## 2018-11-01 DIAGNOSIS — G8194 Hemiplegia, unspecified affecting left nondominant side: Secondary | ICD-10-CM | POA: Diagnosis not present

## 2018-11-01 DIAGNOSIS — Z7189 Other specified counseling: Secondary | ICD-10-CM | POA: Diagnosis not present

## 2018-11-01 DIAGNOSIS — N183 Chronic kidney disease, stage 3 (moderate): Secondary | ICD-10-CM | POA: Diagnosis not present

## 2018-11-01 DIAGNOSIS — E1149 Type 2 diabetes mellitus with other diabetic neurological complication: Secondary | ICD-10-CM | POA: Diagnosis not present

## 2018-11-01 DIAGNOSIS — I5032 Chronic diastolic (congestive) heart failure: Secondary | ICD-10-CM | POA: Diagnosis not present

## 2018-11-01 DIAGNOSIS — J449 Chronic obstructive pulmonary disease, unspecified: Secondary | ICD-10-CM | POA: Diagnosis not present

## 2018-11-03 DIAGNOSIS — E1165 Type 2 diabetes mellitus with hyperglycemia: Secondary | ICD-10-CM | POA: Diagnosis not present

## 2018-11-03 DIAGNOSIS — I509 Heart failure, unspecified: Secondary | ICD-10-CM | POA: Diagnosis not present

## 2018-11-03 DIAGNOSIS — E1159 Type 2 diabetes mellitus with other circulatory complications: Secondary | ICD-10-CM | POA: Diagnosis not present

## 2018-11-03 DIAGNOSIS — J449 Chronic obstructive pulmonary disease, unspecified: Secondary | ICD-10-CM | POA: Diagnosis not present

## 2018-11-03 DIAGNOSIS — G8104 Flaccid hemiplegia affecting left nondominant side: Secondary | ICD-10-CM | POA: Diagnosis not present

## 2018-11-04 DIAGNOSIS — E1159 Type 2 diabetes mellitus with other circulatory complications: Secondary | ICD-10-CM | POA: Diagnosis not present

## 2018-11-04 DIAGNOSIS — D61818 Other pancytopenia: Secondary | ICD-10-CM | POA: Diagnosis not present

## 2018-11-04 DIAGNOSIS — I69354 Hemiplegia and hemiparesis following cerebral infarction affecting left non-dominant side: Secondary | ICD-10-CM | POA: Diagnosis not present

## 2018-11-04 DIAGNOSIS — E1149 Type 2 diabetes mellitus with other diabetic neurological complication: Secondary | ICD-10-CM | POA: Diagnosis not present

## 2018-11-04 DIAGNOSIS — E1165 Type 2 diabetes mellitus with hyperglycemia: Secondary | ICD-10-CM | POA: Diagnosis not present

## 2018-11-05 DIAGNOSIS — E1149 Type 2 diabetes mellitus with other diabetic neurological complication: Secondary | ICD-10-CM | POA: Diagnosis not present

## 2018-11-05 DIAGNOSIS — I509 Heart failure, unspecified: Secondary | ICD-10-CM | POA: Diagnosis not present

## 2018-11-05 DIAGNOSIS — J449 Chronic obstructive pulmonary disease, unspecified: Secondary | ICD-10-CM | POA: Diagnosis not present

## 2018-11-05 DIAGNOSIS — I69354 Hemiplegia and hemiparesis following cerebral infarction affecting left non-dominant side: Secondary | ICD-10-CM | POA: Diagnosis not present

## 2018-11-05 DIAGNOSIS — I5032 Chronic diastolic (congestive) heart failure: Secondary | ICD-10-CM | POA: Diagnosis not present

## 2018-11-05 DIAGNOSIS — I63331 Cerebral infarction due to thrombosis of right posterior cerebral artery: Secondary | ICD-10-CM | POA: Diagnosis not present

## 2018-11-05 DIAGNOSIS — Z794 Long term (current) use of insulin: Secondary | ICD-10-CM | POA: Diagnosis not present

## 2018-11-05 DIAGNOSIS — E1165 Type 2 diabetes mellitus with hyperglycemia: Secondary | ICD-10-CM | POA: Diagnosis not present

## 2018-11-05 DIAGNOSIS — N179 Acute kidney failure, unspecified: Secondary | ICD-10-CM | POA: Diagnosis not present

## 2018-11-06 DIAGNOSIS — E1165 Type 2 diabetes mellitus with hyperglycemia: Secondary | ICD-10-CM | POA: Diagnosis not present

## 2018-11-06 DIAGNOSIS — I5032 Chronic diastolic (congestive) heart failure: Secondary | ICD-10-CM | POA: Diagnosis not present

## 2018-11-06 DIAGNOSIS — N179 Acute kidney failure, unspecified: Secondary | ICD-10-CM | POA: Diagnosis not present

## 2018-11-06 DIAGNOSIS — E1149 Type 2 diabetes mellitus with other diabetic neurological complication: Secondary | ICD-10-CM | POA: Diagnosis not present

## 2018-11-06 DIAGNOSIS — Z794 Long term (current) use of insulin: Secondary | ICD-10-CM | POA: Diagnosis not present

## 2018-11-07 DIAGNOSIS — N179 Acute kidney failure, unspecified: Secondary | ICD-10-CM | POA: Diagnosis not present

## 2018-11-07 DIAGNOSIS — Z7189 Other specified counseling: Secondary | ICD-10-CM | POA: Diagnosis not present

## 2018-11-07 DIAGNOSIS — D696 Thrombocytopenia, unspecified: Secondary | ICD-10-CM | POA: Diagnosis not present

## 2018-11-07 DIAGNOSIS — E1149 Type 2 diabetes mellitus with other diabetic neurological complication: Secondary | ICD-10-CM | POA: Diagnosis not present

## 2018-11-07 DIAGNOSIS — N183 Chronic kidney disease, stage 3 (moderate): Secondary | ICD-10-CM | POA: Diagnosis not present

## 2018-11-07 DIAGNOSIS — E1165 Type 2 diabetes mellitus with hyperglycemia: Secondary | ICD-10-CM | POA: Diagnosis not present

## 2018-11-07 DIAGNOSIS — I5032 Chronic diastolic (congestive) heart failure: Secondary | ICD-10-CM | POA: Diagnosis not present

## 2018-11-07 DIAGNOSIS — D72819 Decreased white blood cell count, unspecified: Secondary | ICD-10-CM | POA: Diagnosis not present

## 2018-11-07 DIAGNOSIS — D509 Iron deficiency anemia, unspecified: Secondary | ICD-10-CM | POA: Diagnosis not present

## 2018-11-07 DIAGNOSIS — Z794 Long term (current) use of insulin: Secondary | ICD-10-CM | POA: Diagnosis not present

## 2018-11-08 DIAGNOSIS — N183 Chronic kidney disease, stage 3 (moderate): Secondary | ICD-10-CM | POA: Diagnosis not present

## 2018-11-08 DIAGNOSIS — E1165 Type 2 diabetes mellitus with hyperglycemia: Secondary | ICD-10-CM | POA: Diagnosis not present

## 2018-11-08 DIAGNOSIS — R600 Localized edema: Secondary | ICD-10-CM | POA: Diagnosis not present

## 2018-11-08 DIAGNOSIS — Z7189 Other specified counseling: Secondary | ICD-10-CM | POA: Diagnosis not present

## 2018-11-08 DIAGNOSIS — I639 Cerebral infarction, unspecified: Secondary | ICD-10-CM | POA: Diagnosis not present

## 2018-11-08 DIAGNOSIS — E1149 Type 2 diabetes mellitus with other diabetic neurological complication: Secondary | ICD-10-CM | POA: Diagnosis not present

## 2018-11-08 DIAGNOSIS — I5032 Chronic diastolic (congestive) heart failure: Secondary | ICD-10-CM | POA: Diagnosis not present

## 2018-11-08 DIAGNOSIS — Z794 Long term (current) use of insulin: Secondary | ICD-10-CM | POA: Diagnosis not present

## 2018-11-08 DIAGNOSIS — N179 Acute kidney failure, unspecified: Secondary | ICD-10-CM | POA: Diagnosis not present

## 2018-11-09 DIAGNOSIS — E1149 Type 2 diabetes mellitus with other diabetic neurological complication: Secondary | ICD-10-CM | POA: Diagnosis not present

## 2018-11-09 DIAGNOSIS — I5032 Chronic diastolic (congestive) heart failure: Secondary | ICD-10-CM | POA: Diagnosis not present

## 2018-11-09 DIAGNOSIS — N179 Acute kidney failure, unspecified: Secondary | ICD-10-CM | POA: Diagnosis not present

## 2018-11-09 DIAGNOSIS — E1165 Type 2 diabetes mellitus with hyperglycemia: Secondary | ICD-10-CM | POA: Diagnosis not present

## 2018-11-09 DIAGNOSIS — Z794 Long term (current) use of insulin: Secondary | ICD-10-CM | POA: Diagnosis not present

## 2018-11-11 DIAGNOSIS — Z794 Long term (current) use of insulin: Secondary | ICD-10-CM | POA: Diagnosis not present

## 2018-11-11 DIAGNOSIS — I63331 Cerebral infarction due to thrombosis of right posterior cerebral artery: Secondary | ICD-10-CM | POA: Diagnosis not present

## 2018-11-11 DIAGNOSIS — E1149 Type 2 diabetes mellitus with other diabetic neurological complication: Secondary | ICD-10-CM | POA: Diagnosis not present

## 2018-11-11 DIAGNOSIS — I509 Heart failure, unspecified: Secondary | ICD-10-CM | POA: Diagnosis not present

## 2018-11-11 DIAGNOSIS — E1165 Type 2 diabetes mellitus with hyperglycemia: Secondary | ICD-10-CM | POA: Diagnosis not present

## 2018-11-11 DIAGNOSIS — N179 Acute kidney failure, unspecified: Secondary | ICD-10-CM | POA: Diagnosis not present

## 2018-11-11 DIAGNOSIS — J449 Chronic obstructive pulmonary disease, unspecified: Secondary | ICD-10-CM | POA: Diagnosis not present

## 2018-11-11 DIAGNOSIS — I5032 Chronic diastolic (congestive) heart failure: Secondary | ICD-10-CM | POA: Diagnosis not present

## 2018-11-11 DIAGNOSIS — I69354 Hemiplegia and hemiparesis following cerebral infarction affecting left non-dominant side: Secondary | ICD-10-CM | POA: Diagnosis not present

## 2018-11-12 DIAGNOSIS — D61818 Other pancytopenia: Secondary | ICD-10-CM | POA: Diagnosis not present

## 2018-11-12 DIAGNOSIS — E1149 Type 2 diabetes mellitus with other diabetic neurological complication: Secondary | ICD-10-CM | POA: Diagnosis not present

## 2018-11-12 DIAGNOSIS — E1159 Type 2 diabetes mellitus with other circulatory complications: Secondary | ICD-10-CM | POA: Diagnosis not present

## 2018-11-12 DIAGNOSIS — I69354 Hemiplegia and hemiparesis following cerebral infarction affecting left non-dominant side: Secondary | ICD-10-CM | POA: Diagnosis not present

## 2018-11-12 DIAGNOSIS — E1165 Type 2 diabetes mellitus with hyperglycemia: Secondary | ICD-10-CM | POA: Diagnosis not present

## 2018-11-13 DIAGNOSIS — I69354 Hemiplegia and hemiparesis following cerebral infarction affecting left non-dominant side: Secondary | ICD-10-CM | POA: Diagnosis not present

## 2018-11-13 DIAGNOSIS — D61818 Other pancytopenia: Secondary | ICD-10-CM | POA: Diagnosis not present

## 2018-11-13 DIAGNOSIS — I5032 Chronic diastolic (congestive) heart failure: Secondary | ICD-10-CM | POA: Diagnosis not present

## 2018-11-13 DIAGNOSIS — E1149 Type 2 diabetes mellitus with other diabetic neurological complication: Secondary | ICD-10-CM | POA: Diagnosis not present

## 2018-11-13 DIAGNOSIS — Z7189 Other specified counseling: Secondary | ICD-10-CM | POA: Diagnosis not present

## 2018-11-13 DIAGNOSIS — E1165 Type 2 diabetes mellitus with hyperglycemia: Secondary | ICD-10-CM | POA: Diagnosis not present

## 2018-11-13 DIAGNOSIS — N183 Chronic kidney disease, stage 3 (moderate): Secondary | ICD-10-CM | POA: Diagnosis not present

## 2018-11-13 DIAGNOSIS — E1159 Type 2 diabetes mellitus with other circulatory complications: Secondary | ICD-10-CM | POA: Diagnosis not present

## 2018-11-14 DIAGNOSIS — K219 Gastro-esophageal reflux disease without esophagitis: Secondary | ICD-10-CM | POA: Diagnosis not present

## 2018-11-14 DIAGNOSIS — E875 Hyperkalemia: Secondary | ICD-10-CM | POA: Diagnosis not present

## 2018-11-14 DIAGNOSIS — I130 Hypertensive heart and chronic kidney disease with heart failure and stage 1 through stage 4 chronic kidney disease, or unspecified chronic kidney disease: Secondary | ICD-10-CM | POA: Diagnosis not present

## 2018-11-14 DIAGNOSIS — E039 Hypothyroidism, unspecified: Secondary | ICD-10-CM | POA: Diagnosis not present

## 2018-11-14 DIAGNOSIS — E1149 Type 2 diabetes mellitus with other diabetic neurological complication: Secondary | ICD-10-CM | POA: Diagnosis not present

## 2018-11-14 DIAGNOSIS — R600 Localized edema: Secondary | ICD-10-CM | POA: Diagnosis not present

## 2018-11-14 DIAGNOSIS — D649 Anemia, unspecified: Secondary | ICD-10-CM | POA: Diagnosis not present

## 2018-11-14 DIAGNOSIS — Z7189 Other specified counseling: Secondary | ICD-10-CM | POA: Diagnosis not present

## 2018-11-14 DIAGNOSIS — J449 Chronic obstructive pulmonary disease, unspecified: Secondary | ICD-10-CM | POA: Diagnosis not present

## 2018-11-14 DIAGNOSIS — E1122 Type 2 diabetes mellitus with diabetic chronic kidney disease: Secondary | ICD-10-CM | POA: Diagnosis not present

## 2018-11-14 DIAGNOSIS — G8929 Other chronic pain: Secondary | ICD-10-CM | POA: Diagnosis not present

## 2018-11-14 DIAGNOSIS — M1A9XX0 Chronic gout, unspecified, without tophus (tophi): Secondary | ICD-10-CM | POA: Diagnosis not present

## 2018-11-14 DIAGNOSIS — M25552 Pain in left hip: Secondary | ICD-10-CM | POA: Diagnosis not present

## 2018-11-14 DIAGNOSIS — E559 Vitamin D deficiency, unspecified: Secondary | ICD-10-CM | POA: Diagnosis not present

## 2018-11-14 DIAGNOSIS — E785 Hyperlipidemia, unspecified: Secondary | ICD-10-CM | POA: Diagnosis not present

## 2018-11-14 DIAGNOSIS — I69354 Hemiplegia and hemiparesis following cerebral infarction affecting left non-dominant side: Secondary | ICD-10-CM | POA: Diagnosis not present

## 2018-11-14 DIAGNOSIS — N183 Chronic kidney disease, stage 3 (moderate): Secondary | ICD-10-CM | POA: Diagnosis not present

## 2018-11-14 DIAGNOSIS — R8271 Bacteriuria: Secondary | ICD-10-CM | POA: Diagnosis not present

## 2018-11-14 DIAGNOSIS — I5032 Chronic diastolic (congestive) heart failure: Secondary | ICD-10-CM | POA: Diagnosis not present

## 2018-11-14 DIAGNOSIS — Z1159 Encounter for screening for other viral diseases: Secondary | ICD-10-CM | POA: Diagnosis not present

## 2018-11-15 DIAGNOSIS — E785 Hyperlipidemia, unspecified: Secondary | ICD-10-CM | POA: Diagnosis not present

## 2018-11-15 DIAGNOSIS — D649 Anemia, unspecified: Secondary | ICD-10-CM | POA: Diagnosis not present

## 2018-11-15 DIAGNOSIS — G8929 Other chronic pain: Secondary | ICD-10-CM | POA: Diagnosis not present

## 2018-11-15 DIAGNOSIS — E875 Hyperkalemia: Secondary | ICD-10-CM | POA: Diagnosis not present

## 2018-11-15 DIAGNOSIS — J449 Chronic obstructive pulmonary disease, unspecified: Secondary | ICD-10-CM | POA: Diagnosis not present

## 2018-11-15 DIAGNOSIS — E1149 Type 2 diabetes mellitus with other diabetic neurological complication: Secondary | ICD-10-CM | POA: Diagnosis not present

## 2018-11-15 DIAGNOSIS — E1122 Type 2 diabetes mellitus with diabetic chronic kidney disease: Secondary | ICD-10-CM | POA: Diagnosis not present

## 2018-11-15 DIAGNOSIS — R600 Localized edema: Secondary | ICD-10-CM | POA: Diagnosis not present

## 2018-11-15 DIAGNOSIS — Z1159 Encounter for screening for other viral diseases: Secondary | ICD-10-CM | POA: Diagnosis not present

## 2018-11-15 DIAGNOSIS — E039 Hypothyroidism, unspecified: Secondary | ICD-10-CM | POA: Diagnosis not present

## 2018-11-15 DIAGNOSIS — R8271 Bacteriuria: Secondary | ICD-10-CM | POA: Diagnosis not present

## 2018-11-15 DIAGNOSIS — I130 Hypertensive heart and chronic kidney disease with heart failure and stage 1 through stage 4 chronic kidney disease, or unspecified chronic kidney disease: Secondary | ICD-10-CM | POA: Diagnosis not present

## 2018-11-15 DIAGNOSIS — I5032 Chronic diastolic (congestive) heart failure: Secondary | ICD-10-CM | POA: Diagnosis not present

## 2018-11-15 DIAGNOSIS — N183 Chronic kidney disease, stage 3 (moderate): Secondary | ICD-10-CM | POA: Diagnosis not present

## 2018-11-15 DIAGNOSIS — M1A9XX0 Chronic gout, unspecified, without tophus (tophi): Secondary | ICD-10-CM | POA: Diagnosis not present

## 2018-11-15 DIAGNOSIS — E559 Vitamin D deficiency, unspecified: Secondary | ICD-10-CM | POA: Diagnosis not present

## 2018-11-15 DIAGNOSIS — K219 Gastro-esophageal reflux disease without esophagitis: Secondary | ICD-10-CM | POA: Diagnosis not present

## 2018-11-15 DIAGNOSIS — I69354 Hemiplegia and hemiparesis following cerebral infarction affecting left non-dominant side: Secondary | ICD-10-CM | POA: Diagnosis not present

## 2018-11-15 DIAGNOSIS — M25552 Pain in left hip: Secondary | ICD-10-CM | POA: Diagnosis not present

## 2018-11-16 DIAGNOSIS — M1A39X1 Chronic gout due to renal impairment, multiple sites, with tophus (tophi): Secondary | ICD-10-CM | POA: Diagnosis not present

## 2018-11-16 DIAGNOSIS — R600 Localized edema: Secondary | ICD-10-CM | POA: Diagnosis not present

## 2018-11-16 DIAGNOSIS — D649 Anemia, unspecified: Secondary | ICD-10-CM | POA: Diagnosis not present

## 2018-11-18 DIAGNOSIS — I5032 Chronic diastolic (congestive) heart failure: Secondary | ICD-10-CM | POA: Diagnosis not present

## 2018-11-18 DIAGNOSIS — D638 Anemia in other chronic diseases classified elsewhere: Secondary | ICD-10-CM | POA: Diagnosis not present

## 2018-11-18 DIAGNOSIS — N183 Chronic kidney disease, stage 3 (moderate): Secondary | ICD-10-CM | POA: Diagnosis not present

## 2018-11-18 DIAGNOSIS — E1165 Type 2 diabetes mellitus with hyperglycemia: Secondary | ICD-10-CM | POA: Diagnosis not present

## 2018-11-18 DIAGNOSIS — E1159 Type 2 diabetes mellitus with other circulatory complications: Secondary | ICD-10-CM | POA: Diagnosis not present

## 2018-11-20 DIAGNOSIS — I69354 Hemiplegia and hemiparesis following cerebral infarction affecting left non-dominant side: Secondary | ICD-10-CM | POA: Diagnosis not present

## 2018-11-20 DIAGNOSIS — R338 Other retention of urine: Secondary | ICD-10-CM | POA: Diagnosis not present

## 2018-11-20 DIAGNOSIS — E1159 Type 2 diabetes mellitus with other circulatory complications: Secondary | ICD-10-CM | POA: Diagnosis not present

## 2018-11-20 DIAGNOSIS — E1165 Type 2 diabetes mellitus with hyperglycemia: Secondary | ICD-10-CM | POA: Diagnosis not present

## 2018-11-20 DIAGNOSIS — Z794 Long term (current) use of insulin: Secondary | ICD-10-CM | POA: Diagnosis not present

## 2018-11-21 DIAGNOSIS — E1159 Type 2 diabetes mellitus with other circulatory complications: Secondary | ICD-10-CM | POA: Diagnosis not present

## 2018-11-21 DIAGNOSIS — K8689 Other specified diseases of pancreas: Secondary | ICD-10-CM | POA: Diagnosis not present

## 2018-11-21 DIAGNOSIS — E1165 Type 2 diabetes mellitus with hyperglycemia: Secondary | ICD-10-CM | POA: Diagnosis not present

## 2018-11-21 DIAGNOSIS — K5900 Constipation, unspecified: Secondary | ICD-10-CM | POA: Diagnosis not present

## 2018-11-21 DIAGNOSIS — K449 Diaphragmatic hernia without obstruction or gangrene: Secondary | ICD-10-CM | POA: Diagnosis not present

## 2018-11-21 DIAGNOSIS — R338 Other retention of urine: Secondary | ICD-10-CM | POA: Diagnosis not present

## 2018-11-21 DIAGNOSIS — R339 Retention of urine, unspecified: Secondary | ICD-10-CM | POA: Diagnosis not present

## 2018-11-21 DIAGNOSIS — R739 Hyperglycemia, unspecified: Secondary | ICD-10-CM | POA: Diagnosis not present

## 2018-11-22 DIAGNOSIS — E1165 Type 2 diabetes mellitus with hyperglycemia: Secondary | ICD-10-CM | POA: Diagnosis not present

## 2018-11-22 DIAGNOSIS — N183 Chronic kidney disease, stage 3 (moderate): Secondary | ICD-10-CM | POA: Diagnosis not present

## 2018-11-22 DIAGNOSIS — E1159 Type 2 diabetes mellitus with other circulatory complications: Secondary | ICD-10-CM | POA: Diagnosis not present

## 2018-11-22 DIAGNOSIS — I5032 Chronic diastolic (congestive) heart failure: Secondary | ICD-10-CM | POA: Diagnosis not present

## 2018-11-22 DIAGNOSIS — K8689 Other specified diseases of pancreas: Secondary | ICD-10-CM | POA: Diagnosis not present

## 2018-11-23 DIAGNOSIS — R338 Other retention of urine: Secondary | ICD-10-CM | POA: Diagnosis not present

## 2018-11-23 DIAGNOSIS — K5900 Constipation, unspecified: Secondary | ICD-10-CM | POA: Diagnosis not present

## 2018-11-23 DIAGNOSIS — D638 Anemia in other chronic diseases classified elsewhere: Secondary | ICD-10-CM | POA: Diagnosis not present

## 2018-11-23 DIAGNOSIS — E1165 Type 2 diabetes mellitus with hyperglycemia: Secondary | ICD-10-CM | POA: Diagnosis not present

## 2018-11-23 DIAGNOSIS — E1159 Type 2 diabetes mellitus with other circulatory complications: Secondary | ICD-10-CM | POA: Diagnosis not present

## 2018-11-24 DIAGNOSIS — E1159 Type 2 diabetes mellitus with other circulatory complications: Secondary | ICD-10-CM | POA: Diagnosis not present

## 2018-11-24 DIAGNOSIS — E1165 Type 2 diabetes mellitus with hyperglycemia: Secondary | ICD-10-CM | POA: Diagnosis not present

## 2018-11-24 DIAGNOSIS — R338 Other retention of urine: Secondary | ICD-10-CM | POA: Diagnosis not present

## 2018-11-24 DIAGNOSIS — K8689 Other specified diseases of pancreas: Secondary | ICD-10-CM | POA: Diagnosis not present

## 2018-11-24 DIAGNOSIS — D638 Anemia in other chronic diseases classified elsewhere: Secondary | ICD-10-CM | POA: Diagnosis not present

## 2018-11-25 ENCOUNTER — Ambulatory Visit (INDEPENDENT_AMBULATORY_CARE_PROVIDER_SITE_OTHER): Payer: Medicare Other | Admitting: Otolaryngology

## 2018-11-25 DIAGNOSIS — E1165 Type 2 diabetes mellitus with hyperglycemia: Secondary | ICD-10-CM | POA: Diagnosis not present

## 2018-11-25 DIAGNOSIS — D638 Anemia in other chronic diseases classified elsewhere: Secondary | ICD-10-CM | POA: Diagnosis not present

## 2018-11-25 DIAGNOSIS — R338 Other retention of urine: Secondary | ICD-10-CM | POA: Diagnosis not present

## 2018-11-25 DIAGNOSIS — E1159 Type 2 diabetes mellitus with other circulatory complications: Secondary | ICD-10-CM | POA: Diagnosis not present

## 2018-11-25 DIAGNOSIS — K5900 Constipation, unspecified: Secondary | ICD-10-CM | POA: Diagnosis not present

## 2018-11-26 DIAGNOSIS — D638 Anemia in other chronic diseases classified elsewhere: Secondary | ICD-10-CM | POA: Diagnosis not present

## 2018-11-26 DIAGNOSIS — E1165 Type 2 diabetes mellitus with hyperglycemia: Secondary | ICD-10-CM | POA: Diagnosis not present

## 2018-11-26 DIAGNOSIS — R338 Other retention of urine: Secondary | ICD-10-CM | POA: Diagnosis not present

## 2018-11-26 DIAGNOSIS — E1159 Type 2 diabetes mellitus with other circulatory complications: Secondary | ICD-10-CM | POA: Diagnosis not present

## 2018-11-26 DIAGNOSIS — K5900 Constipation, unspecified: Secondary | ICD-10-CM | POA: Diagnosis not present

## 2018-11-27 DIAGNOSIS — R338 Other retention of urine: Secondary | ICD-10-CM | POA: Diagnosis not present

## 2018-11-27 DIAGNOSIS — E1159 Type 2 diabetes mellitus with other circulatory complications: Secondary | ICD-10-CM | POA: Diagnosis not present

## 2018-11-27 DIAGNOSIS — E1165 Type 2 diabetes mellitus with hyperglycemia: Secondary | ICD-10-CM | POA: Diagnosis not present

## 2018-11-27 DIAGNOSIS — K5900 Constipation, unspecified: Secondary | ICD-10-CM | POA: Diagnosis not present

## 2018-11-27 DIAGNOSIS — D638 Anemia in other chronic diseases classified elsewhere: Secondary | ICD-10-CM | POA: Diagnosis not present

## 2018-11-28 ENCOUNTER — Other Ambulatory Visit: Payer: Self-pay

## 2018-11-28 DIAGNOSIS — D638 Anemia in other chronic diseases classified elsewhere: Secondary | ICD-10-CM | POA: Diagnosis not present

## 2018-11-28 DIAGNOSIS — K5900 Constipation, unspecified: Secondary | ICD-10-CM | POA: Diagnosis not present

## 2018-11-28 DIAGNOSIS — R338 Other retention of urine: Secondary | ICD-10-CM | POA: Diagnosis not present

## 2018-11-28 DIAGNOSIS — E1165 Type 2 diabetes mellitus with hyperglycemia: Secondary | ICD-10-CM | POA: Diagnosis not present

## 2018-11-28 DIAGNOSIS — E1159 Type 2 diabetes mellitus with other circulatory complications: Secondary | ICD-10-CM | POA: Diagnosis not present

## 2018-11-28 NOTE — Patient Outreach (Signed)
[facility]) Care Management  11/28/2018  Jaclyn Herrera 06/22/1945 [id number]   Medication Adherence call to [name] Telephone call to Patient regarding Medication Adherence unable to reach patient. [name] is showing past due on Simvastatin 20 mg and Quinapril 40 mg under [place].   [facility] Management Direct Dial [phone]  Fax [phone] [email or web address]

## 2018-11-29 DIAGNOSIS — D638 Anemia in other chronic diseases classified elsewhere: Secondary | ICD-10-CM | POA: Diagnosis not present

## 2018-11-29 DIAGNOSIS — K5900 Constipation, unspecified: Secondary | ICD-10-CM | POA: Diagnosis not present

## 2018-11-29 DIAGNOSIS — E1159 Type 2 diabetes mellitus with other circulatory complications: Secondary | ICD-10-CM | POA: Diagnosis not present

## 2018-11-29 DIAGNOSIS — R338 Other retention of urine: Secondary | ICD-10-CM | POA: Diagnosis not present

## 2018-11-29 DIAGNOSIS — E1165 Type 2 diabetes mellitus with hyperglycemia: Secondary | ICD-10-CM | POA: Diagnosis not present

## 2018-11-30 DIAGNOSIS — R338 Other retention of urine: Secondary | ICD-10-CM | POA: Diagnosis not present

## 2018-11-30 DIAGNOSIS — E1165 Type 2 diabetes mellitus with hyperglycemia: Secondary | ICD-10-CM | POA: Diagnosis not present

## 2018-11-30 DIAGNOSIS — E11649 Type 2 diabetes mellitus with hypoglycemia without coma: Secondary | ICD-10-CM | POA: Diagnosis not present

## 2018-11-30 DIAGNOSIS — E1159 Type 2 diabetes mellitus with other circulatory complications: Secondary | ICD-10-CM | POA: Diagnosis not present

## 2018-11-30 DIAGNOSIS — K5641 Fecal impaction: Secondary | ICD-10-CM | POA: Diagnosis not present

## 2018-11-30 DIAGNOSIS — K5900 Constipation, unspecified: Secondary | ICD-10-CM | POA: Diagnosis not present

## 2018-12-02 DIAGNOSIS — K5900 Constipation, unspecified: Secondary | ICD-10-CM | POA: Diagnosis not present

## 2018-12-02 DIAGNOSIS — Z7189 Other specified counseling: Secondary | ICD-10-CM | POA: Diagnosis not present

## 2018-12-02 DIAGNOSIS — K5641 Fecal impaction: Secondary | ICD-10-CM | POA: Diagnosis not present

## 2018-12-02 DIAGNOSIS — E860 Dehydration: Secondary | ICD-10-CM | POA: Diagnosis not present

## 2018-12-02 DIAGNOSIS — E11649 Type 2 diabetes mellitus with hypoglycemia without coma: Secondary | ICD-10-CM | POA: Diagnosis not present

## 2018-12-02 DIAGNOSIS — N3000 Acute cystitis without hematuria: Secondary | ICD-10-CM | POA: Diagnosis not present

## 2018-12-02 DIAGNOSIS — E1159 Type 2 diabetes mellitus with other circulatory complications: Secondary | ICD-10-CM | POA: Diagnosis not present

## 2018-12-02 DIAGNOSIS — D638 Anemia in other chronic diseases classified elsewhere: Secondary | ICD-10-CM | POA: Diagnosis not present

## 2018-12-02 DIAGNOSIS — R338 Other retention of urine: Secondary | ICD-10-CM | POA: Diagnosis not present

## 2018-12-03 DIAGNOSIS — K5900 Constipation, unspecified: Secondary | ICD-10-CM | POA: Diagnosis not present

## 2018-12-03 DIAGNOSIS — N3000 Acute cystitis without hematuria: Secondary | ICD-10-CM | POA: Diagnosis not present

## 2018-12-03 DIAGNOSIS — E1159 Type 2 diabetes mellitus with other circulatory complications: Secondary | ICD-10-CM | POA: Diagnosis not present

## 2018-12-03 DIAGNOSIS — Z7189 Other specified counseling: Secondary | ICD-10-CM | POA: Diagnosis not present

## 2018-12-03 DIAGNOSIS — E11649 Type 2 diabetes mellitus with hypoglycemia without coma: Secondary | ICD-10-CM | POA: Diagnosis not present

## 2018-12-03 DIAGNOSIS — R338 Other retention of urine: Secondary | ICD-10-CM | POA: Diagnosis not present

## 2018-12-03 DIAGNOSIS — D638 Anemia in other chronic diseases classified elsewhere: Secondary | ICD-10-CM | POA: Diagnosis not present

## 2018-12-03 DIAGNOSIS — E1165 Type 2 diabetes mellitus with hyperglycemia: Secondary | ICD-10-CM | POA: Diagnosis not present

## 2018-12-05 DIAGNOSIS — A498 Other bacterial infections of unspecified site: Secondary | ICD-10-CM | POA: Diagnosis not present

## 2018-12-05 DIAGNOSIS — E1159 Type 2 diabetes mellitus with other circulatory complications: Secondary | ICD-10-CM | POA: Diagnosis not present

## 2018-12-05 DIAGNOSIS — R338 Other retention of urine: Secondary | ICD-10-CM | POA: Diagnosis not present

## 2018-12-05 DIAGNOSIS — E860 Dehydration: Secondary | ICD-10-CM | POA: Diagnosis not present

## 2018-12-05 DIAGNOSIS — E1165 Type 2 diabetes mellitus with hyperglycemia: Secondary | ICD-10-CM | POA: Diagnosis not present

## 2018-12-06 DIAGNOSIS — Z794 Long term (current) use of insulin: Secondary | ICD-10-CM | POA: Diagnosis not present

## 2018-12-06 DIAGNOSIS — E875 Hyperkalemia: Secondary | ICD-10-CM | POA: Diagnosis not present

## 2018-12-06 DIAGNOSIS — R338 Other retention of urine: Secondary | ICD-10-CM | POA: Diagnosis not present

## 2018-12-06 DIAGNOSIS — K5900 Constipation, unspecified: Secondary | ICD-10-CM | POA: Diagnosis not present

## 2018-12-06 DIAGNOSIS — E1165 Type 2 diabetes mellitus with hyperglycemia: Secondary | ICD-10-CM | POA: Diagnosis not present

## 2018-12-06 DIAGNOSIS — G9340 Encephalopathy, unspecified: Secondary | ICD-10-CM | POA: Diagnosis not present

## 2018-12-06 DIAGNOSIS — E1159 Type 2 diabetes mellitus with other circulatory complications: Secondary | ICD-10-CM | POA: Diagnosis not present

## 2018-12-06 DIAGNOSIS — Z515 Encounter for palliative care: Secondary | ICD-10-CM | POA: Diagnosis not present

## 2018-12-06 DIAGNOSIS — R531 Weakness: Secondary | ICD-10-CM | POA: Diagnosis not present

## 2018-12-07 DIAGNOSIS — E1159 Type 2 diabetes mellitus with other circulatory complications: Secondary | ICD-10-CM | POA: Diagnosis not present

## 2018-12-07 DIAGNOSIS — A498 Other bacterial infections of unspecified site: Secondary | ICD-10-CM | POA: Diagnosis not present

## 2018-12-07 DIAGNOSIS — R338 Other retention of urine: Secondary | ICD-10-CM | POA: Diagnosis not present

## 2018-12-07 DIAGNOSIS — G9340 Encephalopathy, unspecified: Secondary | ICD-10-CM | POA: Diagnosis not present

## 2018-12-07 DIAGNOSIS — E1165 Type 2 diabetes mellitus with hyperglycemia: Secondary | ICD-10-CM | POA: Diagnosis not present

## 2018-12-08 DIAGNOSIS — E1165 Type 2 diabetes mellitus with hyperglycemia: Secondary | ICD-10-CM | POA: Diagnosis not present

## 2018-12-08 DIAGNOSIS — R338 Other retention of urine: Secondary | ICD-10-CM | POA: Diagnosis not present

## 2018-12-08 DIAGNOSIS — G9340 Encephalopathy, unspecified: Secondary | ICD-10-CM | POA: Diagnosis not present

## 2018-12-08 DIAGNOSIS — I5032 Chronic diastolic (congestive) heart failure: Secondary | ICD-10-CM | POA: Diagnosis not present

## 2018-12-08 DIAGNOSIS — E1159 Type 2 diabetes mellitus with other circulatory complications: Secondary | ICD-10-CM | POA: Diagnosis not present

## 2018-12-09 DIAGNOSIS — K5900 Constipation, unspecified: Secondary | ICD-10-CM | POA: Diagnosis not present

## 2018-12-09 DIAGNOSIS — R338 Other retention of urine: Secondary | ICD-10-CM | POA: Diagnosis not present

## 2018-12-09 DIAGNOSIS — D638 Anemia in other chronic diseases classified elsewhere: Secondary | ICD-10-CM | POA: Diagnosis not present

## 2018-12-09 DIAGNOSIS — E1165 Type 2 diabetes mellitus with hyperglycemia: Secondary | ICD-10-CM | POA: Diagnosis not present

## 2018-12-09 DIAGNOSIS — E1159 Type 2 diabetes mellitus with other circulatory complications: Secondary | ICD-10-CM | POA: Diagnosis not present

## 2018-12-10 DIAGNOSIS — G8194 Hemiplegia, unspecified affecting left nondominant side: Secondary | ICD-10-CM | POA: Diagnosis not present

## 2018-12-10 DIAGNOSIS — E11649 Type 2 diabetes mellitus with hypoglycemia without coma: Secondary | ICD-10-CM | POA: Diagnosis not present

## 2018-12-10 DIAGNOSIS — D638 Anemia in other chronic diseases classified elsewhere: Secondary | ICD-10-CM | POA: Diagnosis not present

## 2018-12-10 DIAGNOSIS — Z515 Encounter for palliative care: Secondary | ICD-10-CM | POA: Diagnosis not present

## 2018-12-10 DIAGNOSIS — E1165 Type 2 diabetes mellitus with hyperglycemia: Secondary | ICD-10-CM | POA: Diagnosis not present

## 2018-12-10 DIAGNOSIS — R338 Other retention of urine: Secondary | ICD-10-CM | POA: Diagnosis not present

## 2018-12-10 DIAGNOSIS — E1159 Type 2 diabetes mellitus with other circulatory complications: Secondary | ICD-10-CM | POA: Diagnosis not present

## 2018-12-10 DIAGNOSIS — K5900 Constipation, unspecified: Secondary | ICD-10-CM | POA: Diagnosis not present

## 2018-12-10 DIAGNOSIS — G9340 Encephalopathy, unspecified: Secondary | ICD-10-CM | POA: Diagnosis not present

## 2018-12-10 DIAGNOSIS — J449 Chronic obstructive pulmonary disease, unspecified: Secondary | ICD-10-CM | POA: Diagnosis not present

## 2018-12-11 DIAGNOSIS — K5900 Constipation, unspecified: Secondary | ICD-10-CM | POA: Diagnosis not present

## 2018-12-11 DIAGNOSIS — E1165 Type 2 diabetes mellitus with hyperglycemia: Secondary | ICD-10-CM | POA: Diagnosis not present

## 2018-12-11 DIAGNOSIS — E1159 Type 2 diabetes mellitus with other circulatory complications: Secondary | ICD-10-CM | POA: Diagnosis not present

## 2018-12-11 DIAGNOSIS — R338 Other retention of urine: Secondary | ICD-10-CM | POA: Diagnosis not present

## 2018-12-11 DIAGNOSIS — D638 Anemia in other chronic diseases classified elsewhere: Secondary | ICD-10-CM | POA: Diagnosis not present

## 2018-12-12 DIAGNOSIS — Z7189 Other specified counseling: Secondary | ICD-10-CM | POA: Diagnosis not present

## 2018-12-12 DIAGNOSIS — K5900 Constipation, unspecified: Secondary | ICD-10-CM | POA: Diagnosis not present

## 2018-12-12 DIAGNOSIS — R338 Other retention of urine: Secondary | ICD-10-CM | POA: Diagnosis not present

## 2018-12-12 DIAGNOSIS — E1165 Type 2 diabetes mellitus with hyperglycemia: Secondary | ICD-10-CM | POA: Diagnosis not present

## 2018-12-12 DIAGNOSIS — N3000 Acute cystitis without hematuria: Secondary | ICD-10-CM | POA: Diagnosis not present

## 2018-12-12 DIAGNOSIS — E1159 Type 2 diabetes mellitus with other circulatory complications: Secondary | ICD-10-CM | POA: Diagnosis not present

## 2018-12-12 DIAGNOSIS — D638 Anemia in other chronic diseases classified elsewhere: Secondary | ICD-10-CM | POA: Diagnosis not present

## 2018-12-13 DIAGNOSIS — R338 Other retention of urine: Secondary | ICD-10-CM | POA: Diagnosis not present

## 2018-12-13 DIAGNOSIS — E1165 Type 2 diabetes mellitus with hyperglycemia: Secondary | ICD-10-CM | POA: Diagnosis not present

## 2018-12-13 DIAGNOSIS — Z515 Encounter for palliative care: Secondary | ICD-10-CM | POA: Diagnosis not present

## 2018-12-13 DIAGNOSIS — Z7189 Other specified counseling: Secondary | ICD-10-CM | POA: Diagnosis not present

## 2018-12-13 DIAGNOSIS — E1159 Type 2 diabetes mellitus with other circulatory complications: Secondary | ICD-10-CM | POA: Diagnosis not present

## 2018-12-13 DIAGNOSIS — K5900 Constipation, unspecified: Secondary | ICD-10-CM | POA: Diagnosis not present

## 2018-12-13 DIAGNOSIS — J449 Chronic obstructive pulmonary disease, unspecified: Secondary | ICD-10-CM | POA: Diagnosis not present

## 2018-12-13 DIAGNOSIS — N3000 Acute cystitis without hematuria: Secondary | ICD-10-CM | POA: Diagnosis not present

## 2018-12-13 DIAGNOSIS — D638 Anemia in other chronic diseases classified elsewhere: Secondary | ICD-10-CM | POA: Diagnosis not present

## 2018-12-14 DIAGNOSIS — E1165 Type 2 diabetes mellitus with hyperglycemia: Secondary | ICD-10-CM | POA: Diagnosis not present

## 2018-12-14 DIAGNOSIS — E11649 Type 2 diabetes mellitus with hypoglycemia without coma: Secondary | ICD-10-CM | POA: Diagnosis not present

## 2018-12-14 DIAGNOSIS — E871 Hypo-osmolality and hyponatremia: Secondary | ICD-10-CM | POA: Diagnosis not present

## 2018-12-14 DIAGNOSIS — E875 Hyperkalemia: Secondary | ICD-10-CM | POA: Diagnosis not present

## 2018-12-14 DIAGNOSIS — R338 Other retention of urine: Secondary | ICD-10-CM | POA: Diagnosis not present

## 2018-12-16 DIAGNOSIS — E1165 Type 2 diabetes mellitus with hyperglycemia: Secondary | ICD-10-CM | POA: Diagnosis not present

## 2018-12-16 DIAGNOSIS — R338 Other retention of urine: Secondary | ICD-10-CM | POA: Diagnosis not present

## 2018-12-16 DIAGNOSIS — E871 Hypo-osmolality and hyponatremia: Secondary | ICD-10-CM | POA: Diagnosis not present

## 2018-12-16 DIAGNOSIS — E875 Hyperkalemia: Secondary | ICD-10-CM | POA: Diagnosis not present

## 2018-12-16 DIAGNOSIS — E11649 Type 2 diabetes mellitus with hypoglycemia without coma: Secondary | ICD-10-CM | POA: Diagnosis not present

## 2018-12-17 DIAGNOSIS — N3001 Acute cystitis with hematuria: Secondary | ICD-10-CM | POA: Diagnosis not present

## 2018-12-17 DIAGNOSIS — R338 Other retention of urine: Secondary | ICD-10-CM | POA: Diagnosis not present

## 2018-12-17 DIAGNOSIS — E1159 Type 2 diabetes mellitus with other circulatory complications: Secondary | ICD-10-CM | POA: Diagnosis not present

## 2018-12-17 DIAGNOSIS — E1165 Type 2 diabetes mellitus with hyperglycemia: Secondary | ICD-10-CM | POA: Diagnosis not present

## 2018-12-17 DIAGNOSIS — E871 Hypo-osmolality and hyponatremia: Secondary | ICD-10-CM | POA: Diagnosis not present

## 2018-12-18 DIAGNOSIS — J449 Chronic obstructive pulmonary disease, unspecified: Secondary | ICD-10-CM | POA: Diagnosis not present

## 2018-12-18 DIAGNOSIS — N3000 Acute cystitis without hematuria: Secondary | ICD-10-CM | POA: Diagnosis not present

## 2018-12-18 DIAGNOSIS — G9340 Encephalopathy, unspecified: Secondary | ICD-10-CM | POA: Diagnosis not present

## 2018-12-18 DIAGNOSIS — R338 Other retention of urine: Secondary | ICD-10-CM | POA: Diagnosis not present

## 2018-12-18 DIAGNOSIS — Z7189 Other specified counseling: Secondary | ICD-10-CM | POA: Diagnosis not present

## 2018-12-19 DIAGNOSIS — D638 Anemia in other chronic diseases classified elsewhere: Secondary | ICD-10-CM | POA: Diagnosis not present

## 2018-12-19 DIAGNOSIS — Z7189 Other specified counseling: Secondary | ICD-10-CM | POA: Diagnosis not present

## 2018-12-19 DIAGNOSIS — R338 Other retention of urine: Secondary | ICD-10-CM | POA: Diagnosis not present

## 2018-12-19 DIAGNOSIS — N3000 Acute cystitis without hematuria: Secondary | ICD-10-CM | POA: Diagnosis not present

## 2018-12-19 DIAGNOSIS — J449 Chronic obstructive pulmonary disease, unspecified: Secondary | ICD-10-CM | POA: Diagnosis not present

## 2018-12-20 DIAGNOSIS — N3001 Acute cystitis with hematuria: Secondary | ICD-10-CM | POA: Diagnosis not present

## 2018-12-20 DIAGNOSIS — R338 Other retention of urine: Secondary | ICD-10-CM | POA: Diagnosis not present

## 2018-12-20 DIAGNOSIS — E1159 Type 2 diabetes mellitus with other circulatory complications: Secondary | ICD-10-CM | POA: Diagnosis not present

## 2018-12-20 DIAGNOSIS — J449 Chronic obstructive pulmonary disease, unspecified: Secondary | ICD-10-CM | POA: Diagnosis not present

## 2018-12-20 DIAGNOSIS — B9620 Unspecified Escherichia coli [E. coli] as the cause of diseases classified elsewhere: Secondary | ICD-10-CM | POA: Diagnosis not present

## 2018-12-20 DIAGNOSIS — Z7189 Other specified counseling: Secondary | ICD-10-CM | POA: Diagnosis not present

## 2018-12-20 DIAGNOSIS — N390 Urinary tract infection, site not specified: Secondary | ICD-10-CM | POA: Diagnosis not present

## 2018-12-20 DIAGNOSIS — B9689 Other specified bacterial agents as the cause of diseases classified elsewhere: Secondary | ICD-10-CM | POA: Diagnosis not present

## 2018-12-20 DIAGNOSIS — I639 Cerebral infarction, unspecified: Secondary | ICD-10-CM | POA: Diagnosis not present

## 2018-12-21 DIAGNOSIS — E1165 Type 2 diabetes mellitus with hyperglycemia: Secondary | ICD-10-CM | POA: Diagnosis not present

## 2018-12-21 DIAGNOSIS — R338 Other retention of urine: Secondary | ICD-10-CM | POA: Diagnosis not present

## 2018-12-21 DIAGNOSIS — G9340 Encephalopathy, unspecified: Secondary | ICD-10-CM | POA: Diagnosis not present

## 2018-12-21 DIAGNOSIS — N390 Urinary tract infection, site not specified: Secondary | ICD-10-CM | POA: Diagnosis not present

## 2018-12-21 DIAGNOSIS — E1159 Type 2 diabetes mellitus with other circulatory complications: Secondary | ICD-10-CM | POA: Diagnosis not present

## 2018-12-22 DIAGNOSIS — E1165 Type 2 diabetes mellitus with hyperglycemia: Secondary | ICD-10-CM | POA: Diagnosis not present

## 2018-12-22 DIAGNOSIS — K5900 Constipation, unspecified: Secondary | ICD-10-CM | POA: Diagnosis not present

## 2018-12-22 DIAGNOSIS — N390 Urinary tract infection, site not specified: Secondary | ICD-10-CM | POA: Diagnosis not present

## 2018-12-22 DIAGNOSIS — E1159 Type 2 diabetes mellitus with other circulatory complications: Secondary | ICD-10-CM | POA: Diagnosis not present

## 2018-12-22 DIAGNOSIS — R338 Other retention of urine: Secondary | ICD-10-CM | POA: Diagnosis not present

## 2018-12-23 DIAGNOSIS — K5900 Constipation, unspecified: Secondary | ICD-10-CM | POA: Diagnosis not present

## 2018-12-23 DIAGNOSIS — E1159 Type 2 diabetes mellitus with other circulatory complications: Secondary | ICD-10-CM | POA: Diagnosis not present

## 2018-12-23 DIAGNOSIS — R338 Other retention of urine: Secondary | ICD-10-CM | POA: Diagnosis not present

## 2018-12-23 DIAGNOSIS — R300 Dysuria: Secondary | ICD-10-CM | POA: Diagnosis not present

## 2018-12-23 DIAGNOSIS — I69354 Hemiplegia and hemiparesis following cerebral infarction affecting left non-dominant side: Secondary | ICD-10-CM | POA: Diagnosis not present

## 2018-12-23 DIAGNOSIS — D638 Anemia in other chronic diseases classified elsewhere: Secondary | ICD-10-CM | POA: Diagnosis not present

## 2018-12-23 DIAGNOSIS — I130 Hypertensive heart and chronic kidney disease with heart failure and stage 1 through stage 4 chronic kidney disease, or unspecified chronic kidney disease: Secondary | ICD-10-CM | POA: Diagnosis not present

## 2018-12-23 DIAGNOSIS — B9689 Other specified bacterial agents as the cause of diseases classified elsewhere: Secondary | ICD-10-CM | POA: Diagnosis not present

## 2018-12-23 DIAGNOSIS — E1165 Type 2 diabetes mellitus with hyperglycemia: Secondary | ICD-10-CM | POA: Diagnosis not present

## 2018-12-24 DIAGNOSIS — N390 Urinary tract infection, site not specified: Secondary | ICD-10-CM | POA: Diagnosis not present

## 2018-12-24 DIAGNOSIS — E1159 Type 2 diabetes mellitus with other circulatory complications: Secondary | ICD-10-CM | POA: Diagnosis not present

## 2018-12-24 DIAGNOSIS — D638 Anemia in other chronic diseases classified elsewhere: Secondary | ICD-10-CM | POA: Diagnosis not present

## 2018-12-24 DIAGNOSIS — I69354 Hemiplegia and hemiparesis following cerebral infarction affecting left non-dominant side: Secondary | ICD-10-CM | POA: Diagnosis not present

## 2018-12-24 DIAGNOSIS — J449 Chronic obstructive pulmonary disease, unspecified: Secondary | ICD-10-CM | POA: Diagnosis not present

## 2018-12-24 DIAGNOSIS — K5900 Constipation, unspecified: Secondary | ICD-10-CM | POA: Diagnosis not present

## 2018-12-24 DIAGNOSIS — N3001 Acute cystitis with hematuria: Secondary | ICD-10-CM | POA: Diagnosis not present

## 2018-12-24 DIAGNOSIS — E1165 Type 2 diabetes mellitus with hyperglycemia: Secondary | ICD-10-CM | POA: Diagnosis not present

## 2018-12-24 DIAGNOSIS — R338 Other retention of urine: Secondary | ICD-10-CM | POA: Diagnosis not present

## 2018-12-25 DIAGNOSIS — E1159 Type 2 diabetes mellitus with other circulatory complications: Secondary | ICD-10-CM | POA: Diagnosis not present

## 2018-12-25 DIAGNOSIS — R338 Other retention of urine: Secondary | ICD-10-CM | POA: Diagnosis not present

## 2018-12-25 DIAGNOSIS — E1165 Type 2 diabetes mellitus with hyperglycemia: Secondary | ICD-10-CM | POA: Diagnosis not present

## 2018-12-25 DIAGNOSIS — K5900 Constipation, unspecified: Secondary | ICD-10-CM | POA: Diagnosis not present

## 2018-12-25 DIAGNOSIS — D638 Anemia in other chronic diseases classified elsewhere: Secondary | ICD-10-CM | POA: Diagnosis not present

## 2018-12-26 DIAGNOSIS — I69998 Other sequelae following unspecified cerebrovascular disease: Secondary | ICD-10-CM | POA: Diagnosis not present

## 2018-12-26 DIAGNOSIS — N3001 Acute cystitis with hematuria: Secondary | ICD-10-CM | POA: Diagnosis not present

## 2018-12-26 DIAGNOSIS — N319 Neuromuscular dysfunction of bladder, unspecified: Secondary | ICD-10-CM | POA: Diagnosis not present

## 2018-12-27 DIAGNOSIS — R338 Other retention of urine: Secondary | ICD-10-CM | POA: Diagnosis not present

## 2018-12-27 DIAGNOSIS — E1165 Type 2 diabetes mellitus with hyperglycemia: Secondary | ICD-10-CM | POA: Diagnosis not present

## 2018-12-27 DIAGNOSIS — E1159 Type 2 diabetes mellitus with other circulatory complications: Secondary | ICD-10-CM | POA: Diagnosis not present

## 2018-12-27 DIAGNOSIS — D638 Anemia in other chronic diseases classified elsewhere: Secondary | ICD-10-CM | POA: Diagnosis not present

## 2018-12-27 DIAGNOSIS — K5900 Constipation, unspecified: Secondary | ICD-10-CM | POA: Diagnosis not present

## 2018-12-28 DIAGNOSIS — D638 Anemia in other chronic diseases classified elsewhere: Secondary | ICD-10-CM | POA: Diagnosis not present

## 2018-12-28 DIAGNOSIS — E1165 Type 2 diabetes mellitus with hyperglycemia: Secondary | ICD-10-CM | POA: Diagnosis not present

## 2018-12-28 DIAGNOSIS — E1159 Type 2 diabetes mellitus with other circulatory complications: Secondary | ICD-10-CM | POA: Diagnosis not present

## 2018-12-28 DIAGNOSIS — K5900 Constipation, unspecified: Secondary | ICD-10-CM | POA: Diagnosis not present

## 2018-12-28 DIAGNOSIS — R338 Other retention of urine: Secondary | ICD-10-CM | POA: Diagnosis not present

## 2018-12-30 DIAGNOSIS — K5900 Constipation, unspecified: Secondary | ICD-10-CM | POA: Diagnosis not present

## 2018-12-30 DIAGNOSIS — E1159 Type 2 diabetes mellitus with other circulatory complications: Secondary | ICD-10-CM | POA: Diagnosis not present

## 2018-12-30 DIAGNOSIS — Z794 Long term (current) use of insulin: Secondary | ICD-10-CM | POA: Diagnosis not present

## 2018-12-30 DIAGNOSIS — E1165 Type 2 diabetes mellitus with hyperglycemia: Secondary | ICD-10-CM | POA: Diagnosis not present

## 2018-12-30 DIAGNOSIS — R338 Other retention of urine: Secondary | ICD-10-CM | POA: Diagnosis not present

## 2018-12-31 DIAGNOSIS — Z794 Long term (current) use of insulin: Secondary | ICD-10-CM | POA: Diagnosis not present

## 2018-12-31 DIAGNOSIS — E1159 Type 2 diabetes mellitus with other circulatory complications: Secondary | ICD-10-CM | POA: Diagnosis not present

## 2018-12-31 DIAGNOSIS — E1165 Type 2 diabetes mellitus with hyperglycemia: Secondary | ICD-10-CM | POA: Diagnosis not present

## 2018-12-31 DIAGNOSIS — K5900 Constipation, unspecified: Secondary | ICD-10-CM | POA: Diagnosis not present

## 2018-12-31 DIAGNOSIS — R338 Other retention of urine: Secondary | ICD-10-CM | POA: Diagnosis not present

## 2019-01-02 DIAGNOSIS — R8271 Bacteriuria: Secondary | ICD-10-CM | POA: Diagnosis not present

## 2019-01-02 DIAGNOSIS — R338 Other retention of urine: Secondary | ICD-10-CM | POA: Diagnosis not present

## 2019-01-02 DIAGNOSIS — E1165 Type 2 diabetes mellitus with hyperglycemia: Secondary | ICD-10-CM | POA: Diagnosis not present

## 2019-01-02 DIAGNOSIS — Z794 Long term (current) use of insulin: Secondary | ICD-10-CM | POA: Diagnosis not present

## 2019-01-02 DIAGNOSIS — Z8619 Personal history of other infectious and parasitic diseases: Secondary | ICD-10-CM | POA: Diagnosis not present

## 2019-01-03 DIAGNOSIS — Z794 Long term (current) use of insulin: Secondary | ICD-10-CM | POA: Diagnosis not present

## 2019-01-03 DIAGNOSIS — E1165 Type 2 diabetes mellitus with hyperglycemia: Secondary | ICD-10-CM | POA: Diagnosis not present

## 2019-01-03 DIAGNOSIS — R338 Other retention of urine: Secondary | ICD-10-CM | POA: Diagnosis not present

## 2019-01-03 DIAGNOSIS — Z8619 Personal history of other infectious and parasitic diseases: Secondary | ICD-10-CM | POA: Diagnosis not present

## 2019-01-03 DIAGNOSIS — N3000 Acute cystitis without hematuria: Secondary | ICD-10-CM | POA: Diagnosis not present

## 2019-01-05 DIAGNOSIS — Z8673 Personal history of transient ischemic attack (TIA), and cerebral infarction without residual deficits: Secondary | ICD-10-CM | POA: Diagnosis not present

## 2019-01-05 DIAGNOSIS — Z7189 Other specified counseling: Secondary | ICD-10-CM | POA: Diagnosis not present

## 2019-01-05 DIAGNOSIS — J449 Chronic obstructive pulmonary disease, unspecified: Secondary | ICD-10-CM | POA: Diagnosis not present

## 2019-01-05 DIAGNOSIS — R338 Other retention of urine: Secondary | ICD-10-CM | POA: Diagnosis not present

## 2019-01-05 DIAGNOSIS — N3000 Acute cystitis without hematuria: Secondary | ICD-10-CM | POA: Diagnosis not present

## 2019-01-06 DIAGNOSIS — E1165 Type 2 diabetes mellitus with hyperglycemia: Secondary | ICD-10-CM | POA: Diagnosis not present

## 2019-01-06 DIAGNOSIS — D638 Anemia in other chronic diseases classified elsewhere: Secondary | ICD-10-CM | POA: Diagnosis not present

## 2019-01-06 DIAGNOSIS — K5900 Constipation, unspecified: Secondary | ICD-10-CM | POA: Diagnosis not present

## 2019-01-06 DIAGNOSIS — E1159 Type 2 diabetes mellitus with other circulatory complications: Secondary | ICD-10-CM | POA: Diagnosis not present

## 2019-01-06 DIAGNOSIS — R338 Other retention of urine: Secondary | ICD-10-CM | POA: Diagnosis not present

## 2019-01-07 DIAGNOSIS — Z515 Encounter for palliative care: Secondary | ICD-10-CM | POA: Diagnosis not present

## 2019-01-07 DIAGNOSIS — I69998 Other sequelae following unspecified cerebrovascular disease: Secondary | ICD-10-CM | POA: Diagnosis not present

## 2019-01-07 DIAGNOSIS — E1165 Type 2 diabetes mellitus with hyperglycemia: Secondary | ICD-10-CM | POA: Diagnosis not present

## 2019-01-07 DIAGNOSIS — I69354 Hemiplegia and hemiparesis following cerebral infarction affecting left non-dominant side: Secondary | ICD-10-CM | POA: Diagnosis not present

## 2019-01-07 DIAGNOSIS — Z7189 Other specified counseling: Secondary | ICD-10-CM | POA: Diagnosis not present

## 2019-01-07 DIAGNOSIS — R338 Other retention of urine: Secondary | ICD-10-CM | POA: Diagnosis not present

## 2019-01-07 DIAGNOSIS — D638 Anemia in other chronic diseases classified elsewhere: Secondary | ICD-10-CM | POA: Diagnosis not present

## 2019-01-07 DIAGNOSIS — K5900 Constipation, unspecified: Secondary | ICD-10-CM | POA: Diagnosis not present

## 2019-01-07 DIAGNOSIS — E1159 Type 2 diabetes mellitus with other circulatory complications: Secondary | ICD-10-CM | POA: Diagnosis not present

## 2019-01-08 DIAGNOSIS — E1165 Type 2 diabetes mellitus with hyperglycemia: Secondary | ICD-10-CM | POA: Diagnosis not present

## 2019-01-08 DIAGNOSIS — D638 Anemia in other chronic diseases classified elsewhere: Secondary | ICD-10-CM | POA: Diagnosis not present

## 2019-01-08 DIAGNOSIS — E1159 Type 2 diabetes mellitus with other circulatory complications: Secondary | ICD-10-CM | POA: Diagnosis not present

## 2019-01-08 DIAGNOSIS — K5900 Constipation, unspecified: Secondary | ICD-10-CM | POA: Diagnosis not present

## 2019-01-08 DIAGNOSIS — R338 Other retention of urine: Secondary | ICD-10-CM | POA: Diagnosis not present

## 2019-01-09 DIAGNOSIS — E785 Hyperlipidemia, unspecified: Secondary | ICD-10-CM | POA: Diagnosis not present

## 2019-01-09 DIAGNOSIS — G459 Transient cerebral ischemic attack, unspecified: Secondary | ICD-10-CM | POA: Diagnosis not present

## 2019-01-09 DIAGNOSIS — U071 COVID-19: Secondary | ICD-10-CM | POA: Diagnosis not present

## 2019-01-09 DIAGNOSIS — S199XXA Unspecified injury of neck, initial encounter: Secondary | ICD-10-CM | POA: Diagnosis not present

## 2019-01-09 DIAGNOSIS — Z79899 Other long term (current) drug therapy: Secondary | ICD-10-CM | POA: Diagnosis not present

## 2019-01-09 DIAGNOSIS — Z91048 Other nonmedicinal substance allergy status: Secondary | ICD-10-CM | POA: Diagnosis not present

## 2019-01-09 DIAGNOSIS — S0511XA Contusion of eyeball and orbital tissues, right eye, initial encounter: Secondary | ICD-10-CM | POA: Diagnosis not present

## 2019-01-09 DIAGNOSIS — W06XXXA Fall from bed, initial encounter: Secondary | ICD-10-CM | POA: Diagnosis not present

## 2019-01-09 DIAGNOSIS — E079 Disorder of thyroid, unspecified: Secondary | ICD-10-CM | POA: Diagnosis not present

## 2019-01-09 DIAGNOSIS — Z743 Need for continuous supervision: Secondary | ICD-10-CM | POA: Diagnosis not present

## 2019-01-09 DIAGNOSIS — K5900 Constipation, unspecified: Secondary | ICD-10-CM | POA: Diagnosis not present

## 2019-01-09 DIAGNOSIS — I509 Heart failure, unspecified: Secondary | ICD-10-CM | POA: Diagnosis not present

## 2019-01-09 DIAGNOSIS — R278 Other lack of coordination: Secondary | ICD-10-CM | POA: Diagnosis not present

## 2019-01-09 DIAGNOSIS — Z882 Allergy status to sulfonamides status: Secondary | ICD-10-CM | POA: Diagnosis not present

## 2019-01-09 DIAGNOSIS — R410 Disorientation, unspecified: Secondary | ICD-10-CM | POA: Diagnosis not present

## 2019-01-09 DIAGNOSIS — Z91040 Latex allergy status: Secondary | ICD-10-CM | POA: Diagnosis not present

## 2019-01-09 DIAGNOSIS — R1311 Dysphagia, oral phase: Secondary | ICD-10-CM | POA: Diagnosis not present

## 2019-01-09 DIAGNOSIS — M6281 Muscle weakness (generalized): Secondary | ICD-10-CM | POA: Diagnosis not present

## 2019-01-09 DIAGNOSIS — Z888 Allergy status to other drugs, medicaments and biological substances status: Secondary | ICD-10-CM | POA: Diagnosis not present

## 2019-01-09 DIAGNOSIS — R338 Other retention of urine: Secondary | ICD-10-CM | POA: Diagnosis not present

## 2019-01-09 DIAGNOSIS — N186 End stage renal disease: Secondary | ICD-10-CM | POA: Diagnosis not present

## 2019-01-09 DIAGNOSIS — I69354 Hemiplegia and hemiparesis following cerebral infarction affecting left non-dominant side: Secondary | ICD-10-CM | POA: Diagnosis not present

## 2019-01-09 DIAGNOSIS — W19XXXA Unspecified fall, initial encounter: Secondary | ICD-10-CM | POA: Diagnosis not present

## 2019-01-09 DIAGNOSIS — M542 Cervicalgia: Secondary | ICD-10-CM | POA: Diagnosis not present

## 2019-01-09 DIAGNOSIS — Z794 Long term (current) use of insulin: Secondary | ICD-10-CM | POA: Diagnosis not present

## 2019-01-09 DIAGNOSIS — Z7901 Long term (current) use of anticoagulants: Secondary | ICD-10-CM | POA: Diagnosis not present

## 2019-01-09 DIAGNOSIS — K219 Gastro-esophageal reflux disease without esophagitis: Secondary | ICD-10-CM | POA: Diagnosis not present

## 2019-01-09 DIAGNOSIS — S0990XA Unspecified injury of head, initial encounter: Secondary | ICD-10-CM | POA: Diagnosis not present

## 2019-01-09 DIAGNOSIS — S0010XA Contusion of unspecified eyelid and periocular area, initial encounter: Secondary | ICD-10-CM | POA: Diagnosis not present

## 2019-01-09 DIAGNOSIS — E1159 Type 2 diabetes mellitus with other circulatory complications: Secondary | ICD-10-CM | POA: Diagnosis not present

## 2019-01-09 DIAGNOSIS — I10 Essential (primary) hypertension: Secondary | ICD-10-CM | POA: Diagnosis not present

## 2019-01-09 DIAGNOSIS — B961 Klebsiella pneumoniae [K. pneumoniae] as the cause of diseases classified elsewhere: Secondary | ICD-10-CM | POA: Diagnosis not present

## 2019-01-09 DIAGNOSIS — E1165 Type 2 diabetes mellitus with hyperglycemia: Secondary | ICD-10-CM | POA: Diagnosis not present

## 2019-01-09 DIAGNOSIS — D638 Anemia in other chronic diseases classified elsewhere: Secondary | ICD-10-CM | POA: Diagnosis not present

## 2019-01-09 DIAGNOSIS — E119 Type 2 diabetes mellitus without complications: Secondary | ICD-10-CM | POA: Diagnosis not present

## 2019-01-09 DIAGNOSIS — D649 Anemia, unspecified: Secondary | ICD-10-CM | POA: Diagnosis not present

## 2019-01-09 DIAGNOSIS — R279 Unspecified lack of coordination: Secondary | ICD-10-CM | POA: Diagnosis not present

## 2019-01-09 DIAGNOSIS — M109 Gout, unspecified: Secondary | ICD-10-CM | POA: Diagnosis not present

## 2019-01-09 DIAGNOSIS — R2231 Localized swelling, mass and lump, right upper limb: Secondary | ICD-10-CM | POA: Diagnosis not present

## 2019-01-09 DIAGNOSIS — Z886 Allergy status to analgesic agent status: Secondary | ICD-10-CM | POA: Diagnosis not present

## 2019-01-09 DIAGNOSIS — R293 Abnormal posture: Secondary | ICD-10-CM | POA: Diagnosis not present

## 2019-01-09 DIAGNOSIS — Z881 Allergy status to other antibiotic agents status: Secondary | ICD-10-CM | POA: Diagnosis not present

## 2019-01-09 DIAGNOSIS — L89323 Pressure ulcer of left buttock, stage 3: Secondary | ICD-10-CM | POA: Diagnosis not present

## 2019-01-09 DIAGNOSIS — R51 Headache: Secondary | ICD-10-CM | POA: Diagnosis not present

## 2019-01-09 DIAGNOSIS — J449 Chronic obstructive pulmonary disease, unspecified: Secondary | ICD-10-CM | POA: Diagnosis not present

## 2019-01-09 DIAGNOSIS — R0902 Hypoxemia: Secondary | ICD-10-CM | POA: Diagnosis not present

## 2019-01-09 DIAGNOSIS — S0011XA Contusion of right eyelid and periocular area, initial encounter: Secondary | ICD-10-CM | POA: Diagnosis not present

## 2019-01-10 DIAGNOSIS — E119 Type 2 diabetes mellitus without complications: Secondary | ICD-10-CM | POA: Diagnosis not present

## 2019-01-10 DIAGNOSIS — I10 Essential (primary) hypertension: Secondary | ICD-10-CM | POA: Diagnosis not present

## 2019-01-10 DIAGNOSIS — I69354 Hemiplegia and hemiparesis following cerebral infarction affecting left non-dominant side: Secondary | ICD-10-CM | POA: Diagnosis not present

## 2019-01-11 DIAGNOSIS — I69354 Hemiplegia and hemiparesis following cerebral infarction affecting left non-dominant side: Secondary | ICD-10-CM | POA: Diagnosis not present

## 2019-01-14 DIAGNOSIS — Z91040 Latex allergy status: Secondary | ICD-10-CM | POA: Diagnosis not present

## 2019-01-14 DIAGNOSIS — E785 Hyperlipidemia, unspecified: Secondary | ICD-10-CM | POA: Diagnosis not present

## 2019-01-14 DIAGNOSIS — S0990XA Unspecified injury of head, initial encounter: Secondary | ICD-10-CM | POA: Diagnosis not present

## 2019-01-14 DIAGNOSIS — E079 Disorder of thyroid, unspecified: Secondary | ICD-10-CM | POA: Diagnosis not present

## 2019-01-14 DIAGNOSIS — Z79899 Other long term (current) drug therapy: Secondary | ICD-10-CM | POA: Diagnosis not present

## 2019-01-14 DIAGNOSIS — K219 Gastro-esophageal reflux disease without esophagitis: Secondary | ICD-10-CM | POA: Diagnosis not present

## 2019-01-14 DIAGNOSIS — R51 Headache: Secondary | ICD-10-CM | POA: Diagnosis not present

## 2019-01-14 DIAGNOSIS — Z882 Allergy status to sulfonamides status: Secondary | ICD-10-CM | POA: Diagnosis not present

## 2019-01-14 DIAGNOSIS — Z881 Allergy status to other antibiotic agents status: Secondary | ICD-10-CM | POA: Diagnosis not present

## 2019-01-14 DIAGNOSIS — W06XXXA Fall from bed, initial encounter: Secondary | ICD-10-CM | POA: Diagnosis not present

## 2019-01-14 DIAGNOSIS — I69354 Hemiplegia and hemiparesis following cerebral infarction affecting left non-dominant side: Secondary | ICD-10-CM | POA: Diagnosis not present

## 2019-01-14 DIAGNOSIS — I10 Essential (primary) hypertension: Secondary | ICD-10-CM | POA: Diagnosis not present

## 2019-01-14 DIAGNOSIS — R2231 Localized swelling, mass and lump, right upper limb: Secondary | ICD-10-CM | POA: Diagnosis not present

## 2019-01-14 DIAGNOSIS — I509 Heart failure, unspecified: Secondary | ICD-10-CM | POA: Diagnosis not present

## 2019-01-14 DIAGNOSIS — J449 Chronic obstructive pulmonary disease, unspecified: Secondary | ICD-10-CM | POA: Diagnosis not present

## 2019-01-14 DIAGNOSIS — S0011XA Contusion of right eyelid and periocular area, initial encounter: Secondary | ICD-10-CM | POA: Diagnosis not present

## 2019-01-14 DIAGNOSIS — M542 Cervicalgia: Secondary | ICD-10-CM | POA: Diagnosis not present

## 2019-01-14 DIAGNOSIS — Z91048 Other nonmedicinal substance allergy status: Secondary | ICD-10-CM | POA: Diagnosis not present

## 2019-01-14 DIAGNOSIS — Z888 Allergy status to other drugs, medicaments and biological substances status: Secondary | ICD-10-CM | POA: Diagnosis not present

## 2019-01-14 DIAGNOSIS — S199XXA Unspecified injury of neck, initial encounter: Secondary | ICD-10-CM | POA: Diagnosis not present

## 2019-01-14 DIAGNOSIS — E119 Type 2 diabetes mellitus without complications: Secondary | ICD-10-CM | POA: Diagnosis not present

## 2019-01-14 DIAGNOSIS — Z886 Allergy status to analgesic agent status: Secondary | ICD-10-CM | POA: Diagnosis not present

## 2019-01-14 DIAGNOSIS — Z7901 Long term (current) use of anticoagulants: Secondary | ICD-10-CM | POA: Diagnosis not present

## 2019-01-14 DIAGNOSIS — Z794 Long term (current) use of insulin: Secondary | ICD-10-CM | POA: Diagnosis not present

## 2019-01-14 DIAGNOSIS — S0010XA Contusion of unspecified eyelid and periocular area, initial encounter: Secondary | ICD-10-CM | POA: Diagnosis not present

## 2019-01-15 DIAGNOSIS — I69354 Hemiplegia and hemiparesis following cerebral infarction affecting left non-dominant side: Secondary | ICD-10-CM | POA: Diagnosis not present

## 2019-01-15 DIAGNOSIS — E119 Type 2 diabetes mellitus without complications: Secondary | ICD-10-CM | POA: Diagnosis not present

## 2019-01-15 DIAGNOSIS — I10 Essential (primary) hypertension: Secondary | ICD-10-CM | POA: Diagnosis not present

## 2019-01-20 DIAGNOSIS — I69354 Hemiplegia and hemiparesis following cerebral infarction affecting left non-dominant side: Secondary | ICD-10-CM | POA: Diagnosis not present

## 2019-01-20 DIAGNOSIS — I10 Essential (primary) hypertension: Secondary | ICD-10-CM | POA: Diagnosis not present

## 2019-01-20 DIAGNOSIS — E119 Type 2 diabetes mellitus without complications: Secondary | ICD-10-CM | POA: Diagnosis not present

## 2019-01-28 DIAGNOSIS — I69354 Hemiplegia and hemiparesis following cerebral infarction affecting left non-dominant side: Secondary | ICD-10-CM | POA: Diagnosis not present

## 2019-01-28 DIAGNOSIS — I10 Essential (primary) hypertension: Secondary | ICD-10-CM | POA: Diagnosis not present

## 2019-01-28 DIAGNOSIS — E119 Type 2 diabetes mellitus without complications: Secondary | ICD-10-CM | POA: Diagnosis not present

## 2019-02-05 DIAGNOSIS — I10 Essential (primary) hypertension: Secondary | ICD-10-CM | POA: Diagnosis not present

## 2019-02-05 DIAGNOSIS — E119 Type 2 diabetes mellitus without complications: Secondary | ICD-10-CM | POA: Diagnosis not present

## 2019-02-05 DIAGNOSIS — I69354 Hemiplegia and hemiparesis following cerebral infarction affecting left non-dominant side: Secondary | ICD-10-CM | POA: Diagnosis not present

## 2019-02-11 DIAGNOSIS — I69354 Hemiplegia and hemiparesis following cerebral infarction affecting left non-dominant side: Secondary | ICD-10-CM | POA: Diagnosis not present

## 2019-02-14 DIAGNOSIS — L89323 Pressure ulcer of left buttock, stage 3: Secondary | ICD-10-CM | POA: Diagnosis not present

## 2019-02-17 DIAGNOSIS — R293 Abnormal posture: Secondary | ICD-10-CM | POA: Diagnosis not present

## 2019-02-17 DIAGNOSIS — R278 Other lack of coordination: Secondary | ICD-10-CM | POA: Diagnosis not present

## 2019-02-17 DIAGNOSIS — J449 Chronic obstructive pulmonary disease, unspecified: Secondary | ICD-10-CM | POA: Diagnosis not present

## 2019-02-17 DIAGNOSIS — M6281 Muscle weakness (generalized): Secondary | ICD-10-CM | POA: Diagnosis not present

## 2019-02-17 DIAGNOSIS — R279 Unspecified lack of coordination: Secondary | ICD-10-CM | POA: Diagnosis not present

## 2019-02-17 DIAGNOSIS — I69354 Hemiplegia and hemiparesis following cerebral infarction affecting left non-dominant side: Secondary | ICD-10-CM | POA: Diagnosis not present

## 2019-02-18 DIAGNOSIS — R293 Abnormal posture: Secondary | ICD-10-CM | POA: Diagnosis not present

## 2019-02-18 DIAGNOSIS — I69354 Hemiplegia and hemiparesis following cerebral infarction affecting left non-dominant side: Secondary | ICD-10-CM | POA: Diagnosis not present

## 2019-02-18 DIAGNOSIS — J449 Chronic obstructive pulmonary disease, unspecified: Secondary | ICD-10-CM | POA: Diagnosis not present

## 2019-02-18 DIAGNOSIS — R278 Other lack of coordination: Secondary | ICD-10-CM | POA: Diagnosis not present

## 2019-02-18 DIAGNOSIS — R279 Unspecified lack of coordination: Secondary | ICD-10-CM | POA: Diagnosis not present

## 2019-02-18 DIAGNOSIS — M6281 Muscle weakness (generalized): Secondary | ICD-10-CM | POA: Diagnosis not present

## 2019-02-19 DIAGNOSIS — J449 Chronic obstructive pulmonary disease, unspecified: Secondary | ICD-10-CM | POA: Diagnosis not present

## 2019-02-19 DIAGNOSIS — R279 Unspecified lack of coordination: Secondary | ICD-10-CM | POA: Diagnosis not present

## 2019-02-19 DIAGNOSIS — I69354 Hemiplegia and hemiparesis following cerebral infarction affecting left non-dominant side: Secondary | ICD-10-CM | POA: Diagnosis not present

## 2019-02-19 DIAGNOSIS — R293 Abnormal posture: Secondary | ICD-10-CM | POA: Diagnosis not present

## 2019-02-19 DIAGNOSIS — L89323 Pressure ulcer of left buttock, stage 3: Secondary | ICD-10-CM | POA: Diagnosis not present

## 2019-02-19 DIAGNOSIS — M6281 Muscle weakness (generalized): Secondary | ICD-10-CM | POA: Diagnosis not present

## 2019-02-19 DIAGNOSIS — R278 Other lack of coordination: Secondary | ICD-10-CM | POA: Diagnosis not present

## 2019-02-24 DIAGNOSIS — I69354 Hemiplegia and hemiparesis following cerebral infarction affecting left non-dominant side: Secondary | ICD-10-CM | POA: Diagnosis not present

## 2019-02-24 DIAGNOSIS — J449 Chronic obstructive pulmonary disease, unspecified: Secondary | ICD-10-CM | POA: Diagnosis not present

## 2019-02-24 DIAGNOSIS — R293 Abnormal posture: Secondary | ICD-10-CM | POA: Diagnosis not present

## 2019-02-24 DIAGNOSIS — R279 Unspecified lack of coordination: Secondary | ICD-10-CM | POA: Diagnosis not present

## 2019-02-24 DIAGNOSIS — R278 Other lack of coordination: Secondary | ICD-10-CM | POA: Diagnosis not present

## 2019-02-24 DIAGNOSIS — M6281 Muscle weakness (generalized): Secondary | ICD-10-CM | POA: Diagnosis not present

## 2019-02-26 DIAGNOSIS — I69354 Hemiplegia and hemiparesis following cerebral infarction affecting left non-dominant side: Secondary | ICD-10-CM | POA: Diagnosis not present

## 2019-02-26 DIAGNOSIS — R278 Other lack of coordination: Secondary | ICD-10-CM | POA: Diagnosis not present

## 2019-02-26 DIAGNOSIS — M6281 Muscle weakness (generalized): Secondary | ICD-10-CM | POA: Diagnosis not present

## 2019-02-26 DIAGNOSIS — R293 Abnormal posture: Secondary | ICD-10-CM | POA: Diagnosis not present

## 2019-02-26 DIAGNOSIS — J449 Chronic obstructive pulmonary disease, unspecified: Secondary | ICD-10-CM | POA: Diagnosis not present

## 2019-02-26 DIAGNOSIS — R279 Unspecified lack of coordination: Secondary | ICD-10-CM | POA: Diagnosis not present

## 2019-02-27 DIAGNOSIS — R293 Abnormal posture: Secondary | ICD-10-CM | POA: Diagnosis not present

## 2019-02-27 DIAGNOSIS — J449 Chronic obstructive pulmonary disease, unspecified: Secondary | ICD-10-CM | POA: Diagnosis not present

## 2019-02-27 DIAGNOSIS — I69354 Hemiplegia and hemiparesis following cerebral infarction affecting left non-dominant side: Secondary | ICD-10-CM | POA: Diagnosis not present

## 2019-02-27 DIAGNOSIS — R279 Unspecified lack of coordination: Secondary | ICD-10-CM | POA: Diagnosis not present

## 2019-02-27 DIAGNOSIS — M6281 Muscle weakness (generalized): Secondary | ICD-10-CM | POA: Diagnosis not present

## 2019-02-27 DIAGNOSIS — R278 Other lack of coordination: Secondary | ICD-10-CM | POA: Diagnosis not present

## 2019-02-28 DIAGNOSIS — L89323 Pressure ulcer of left buttock, stage 3: Secondary | ICD-10-CM | POA: Diagnosis not present

## 2019-03-01 DIAGNOSIS — R278 Other lack of coordination: Secondary | ICD-10-CM | POA: Diagnosis not present

## 2019-03-01 DIAGNOSIS — R293 Abnormal posture: Secondary | ICD-10-CM | POA: Diagnosis not present

## 2019-03-01 DIAGNOSIS — M6281 Muscle weakness (generalized): Secondary | ICD-10-CM | POA: Diagnosis not present

## 2019-03-01 DIAGNOSIS — J449 Chronic obstructive pulmonary disease, unspecified: Secondary | ICD-10-CM | POA: Diagnosis not present

## 2019-03-01 DIAGNOSIS — I69354 Hemiplegia and hemiparesis following cerebral infarction affecting left non-dominant side: Secondary | ICD-10-CM | POA: Diagnosis not present

## 2019-03-01 DIAGNOSIS — R279 Unspecified lack of coordination: Secondary | ICD-10-CM | POA: Diagnosis not present

## 2019-03-02 DIAGNOSIS — J449 Chronic obstructive pulmonary disease, unspecified: Secondary | ICD-10-CM | POA: Diagnosis not present

## 2019-03-02 DIAGNOSIS — M6281 Muscle weakness (generalized): Secondary | ICD-10-CM | POA: Diagnosis not present

## 2019-03-02 DIAGNOSIS — I69354 Hemiplegia and hemiparesis following cerebral infarction affecting left non-dominant side: Secondary | ICD-10-CM | POA: Diagnosis not present

## 2019-03-02 DIAGNOSIS — R293 Abnormal posture: Secondary | ICD-10-CM | POA: Diagnosis not present

## 2019-03-02 DIAGNOSIS — R278 Other lack of coordination: Secondary | ICD-10-CM | POA: Diagnosis not present

## 2019-03-02 DIAGNOSIS — R279 Unspecified lack of coordination: Secondary | ICD-10-CM | POA: Diagnosis not present

## 2019-03-03 DIAGNOSIS — R293 Abnormal posture: Secondary | ICD-10-CM | POA: Diagnosis not present

## 2019-03-03 DIAGNOSIS — M6281 Muscle weakness (generalized): Secondary | ICD-10-CM | POA: Diagnosis not present

## 2019-03-03 DIAGNOSIS — I69354 Hemiplegia and hemiparesis following cerebral infarction affecting left non-dominant side: Secondary | ICD-10-CM | POA: Diagnosis not present

## 2019-03-03 DIAGNOSIS — R279 Unspecified lack of coordination: Secondary | ICD-10-CM | POA: Diagnosis not present

## 2019-03-03 DIAGNOSIS — J449 Chronic obstructive pulmonary disease, unspecified: Secondary | ICD-10-CM | POA: Diagnosis not present

## 2019-03-03 DIAGNOSIS — R278 Other lack of coordination: Secondary | ICD-10-CM | POA: Diagnosis not present

## 2019-03-04 DIAGNOSIS — L89323 Pressure ulcer of left buttock, stage 3: Secondary | ICD-10-CM | POA: Diagnosis not present

## 2019-03-05 DIAGNOSIS — I69354 Hemiplegia and hemiparesis following cerebral infarction affecting left non-dominant side: Secondary | ICD-10-CM | POA: Diagnosis not present

## 2019-03-05 DIAGNOSIS — R278 Other lack of coordination: Secondary | ICD-10-CM | POA: Diagnosis not present

## 2019-03-05 DIAGNOSIS — M6281 Muscle weakness (generalized): Secondary | ICD-10-CM | POA: Diagnosis not present

## 2019-03-05 DIAGNOSIS — J449 Chronic obstructive pulmonary disease, unspecified: Secondary | ICD-10-CM | POA: Diagnosis not present

## 2019-03-05 DIAGNOSIS — R279 Unspecified lack of coordination: Secondary | ICD-10-CM | POA: Diagnosis not present

## 2019-03-05 DIAGNOSIS — R293 Abnormal posture: Secondary | ICD-10-CM | POA: Diagnosis not present

## 2019-03-06 DIAGNOSIS — R278 Other lack of coordination: Secondary | ICD-10-CM | POA: Diagnosis not present

## 2019-03-06 DIAGNOSIS — R293 Abnormal posture: Secondary | ICD-10-CM | POA: Diagnosis not present

## 2019-03-06 DIAGNOSIS — R279 Unspecified lack of coordination: Secondary | ICD-10-CM | POA: Diagnosis not present

## 2019-03-06 DIAGNOSIS — I69354 Hemiplegia and hemiparesis following cerebral infarction affecting left non-dominant side: Secondary | ICD-10-CM | POA: Diagnosis not present

## 2019-03-06 DIAGNOSIS — M6281 Muscle weakness (generalized): Secondary | ICD-10-CM | POA: Diagnosis not present

## 2019-03-06 DIAGNOSIS — J449 Chronic obstructive pulmonary disease, unspecified: Secondary | ICD-10-CM | POA: Diagnosis not present

## 2019-03-07 DIAGNOSIS — L89323 Pressure ulcer of left buttock, stage 3: Secondary | ICD-10-CM | POA: Diagnosis not present

## 2019-03-07 DIAGNOSIS — R278 Other lack of coordination: Secondary | ICD-10-CM | POA: Diagnosis not present

## 2019-03-07 DIAGNOSIS — I69354 Hemiplegia and hemiparesis following cerebral infarction affecting left non-dominant side: Secondary | ICD-10-CM | POA: Diagnosis not present

## 2019-03-07 DIAGNOSIS — R279 Unspecified lack of coordination: Secondary | ICD-10-CM | POA: Diagnosis not present

## 2019-03-07 DIAGNOSIS — R293 Abnormal posture: Secondary | ICD-10-CM | POA: Diagnosis not present

## 2019-03-07 DIAGNOSIS — M6281 Muscle weakness (generalized): Secondary | ICD-10-CM | POA: Diagnosis not present

## 2019-03-07 DIAGNOSIS — J449 Chronic obstructive pulmonary disease, unspecified: Secondary | ICD-10-CM | POA: Diagnosis not present

## 2019-03-10 DIAGNOSIS — R293 Abnormal posture: Secondary | ICD-10-CM | POA: Diagnosis not present

## 2019-03-10 DIAGNOSIS — I69354 Hemiplegia and hemiparesis following cerebral infarction affecting left non-dominant side: Secondary | ICD-10-CM | POA: Diagnosis not present

## 2019-03-10 DIAGNOSIS — R279 Unspecified lack of coordination: Secondary | ICD-10-CM | POA: Diagnosis not present

## 2019-03-10 DIAGNOSIS — M6281 Muscle weakness (generalized): Secondary | ICD-10-CM | POA: Diagnosis not present

## 2019-03-10 DIAGNOSIS — R278 Other lack of coordination: Secondary | ICD-10-CM | POA: Diagnosis not present

## 2019-03-10 DIAGNOSIS — J449 Chronic obstructive pulmonary disease, unspecified: Secondary | ICD-10-CM | POA: Diagnosis not present

## 2019-03-11 DIAGNOSIS — L89323 Pressure ulcer of left buttock, stage 3: Secondary | ICD-10-CM | POA: Diagnosis not present

## 2019-03-11 DIAGNOSIS — R293 Abnormal posture: Secondary | ICD-10-CM | POA: Diagnosis not present

## 2019-03-11 DIAGNOSIS — R278 Other lack of coordination: Secondary | ICD-10-CM | POA: Diagnosis not present

## 2019-03-11 DIAGNOSIS — J449 Chronic obstructive pulmonary disease, unspecified: Secondary | ICD-10-CM | POA: Diagnosis not present

## 2019-03-11 DIAGNOSIS — M6281 Muscle weakness (generalized): Secondary | ICD-10-CM | POA: Diagnosis not present

## 2019-03-11 DIAGNOSIS — R279 Unspecified lack of coordination: Secondary | ICD-10-CM | POA: Diagnosis not present

## 2019-03-11 DIAGNOSIS — I69354 Hemiplegia and hemiparesis following cerebral infarction affecting left non-dominant side: Secondary | ICD-10-CM | POA: Diagnosis not present

## 2019-03-12 DIAGNOSIS — R278 Other lack of coordination: Secondary | ICD-10-CM | POA: Diagnosis not present

## 2019-03-12 DIAGNOSIS — R293 Abnormal posture: Secondary | ICD-10-CM | POA: Diagnosis not present

## 2019-03-12 DIAGNOSIS — I69354 Hemiplegia and hemiparesis following cerebral infarction affecting left non-dominant side: Secondary | ICD-10-CM | POA: Diagnosis not present

## 2019-03-12 DIAGNOSIS — J449 Chronic obstructive pulmonary disease, unspecified: Secondary | ICD-10-CM | POA: Diagnosis not present

## 2019-03-12 DIAGNOSIS — R279 Unspecified lack of coordination: Secondary | ICD-10-CM | POA: Diagnosis not present

## 2019-03-12 DIAGNOSIS — M6281 Muscle weakness (generalized): Secondary | ICD-10-CM | POA: Diagnosis not present

## 2019-03-13 DIAGNOSIS — R1312 Dysphagia, oropharyngeal phase: Secondary | ICD-10-CM | POA: Diagnosis not present

## 2019-03-13 DIAGNOSIS — J449 Chronic obstructive pulmonary disease, unspecified: Secondary | ICD-10-CM | POA: Diagnosis not present

## 2019-03-13 DIAGNOSIS — K219 Gastro-esophageal reflux disease without esophagitis: Secondary | ICD-10-CM | POA: Diagnosis not present

## 2019-03-13 DIAGNOSIS — R293 Abnormal posture: Secondary | ICD-10-CM | POA: Diagnosis not present

## 2019-03-13 DIAGNOSIS — M6281 Muscle weakness (generalized): Secondary | ICD-10-CM | POA: Diagnosis not present

## 2019-03-13 DIAGNOSIS — I69354 Hemiplegia and hemiparesis following cerebral infarction affecting left non-dominant side: Secondary | ICD-10-CM | POA: Diagnosis not present

## 2019-03-13 DIAGNOSIS — E119 Type 2 diabetes mellitus without complications: Secondary | ICD-10-CM | POA: Diagnosis not present

## 2019-03-13 DIAGNOSIS — R279 Unspecified lack of coordination: Secondary | ICD-10-CM | POA: Diagnosis not present

## 2019-03-17 DIAGNOSIS — R279 Unspecified lack of coordination: Secondary | ICD-10-CM | POA: Diagnosis not present

## 2019-03-17 DIAGNOSIS — I69354 Hemiplegia and hemiparesis following cerebral infarction affecting left non-dominant side: Secondary | ICD-10-CM | POA: Diagnosis not present

## 2019-03-17 DIAGNOSIS — J449 Chronic obstructive pulmonary disease, unspecified: Secondary | ICD-10-CM | POA: Diagnosis not present

## 2019-03-17 DIAGNOSIS — E119 Type 2 diabetes mellitus without complications: Secondary | ICD-10-CM | POA: Diagnosis not present

## 2019-03-17 DIAGNOSIS — K219 Gastro-esophageal reflux disease without esophagitis: Secondary | ICD-10-CM | POA: Diagnosis not present

## 2019-03-17 DIAGNOSIS — R1312 Dysphagia, oropharyngeal phase: Secondary | ICD-10-CM | POA: Diagnosis not present

## 2019-03-17 DIAGNOSIS — R293 Abnormal posture: Secondary | ICD-10-CM | POA: Diagnosis not present

## 2019-03-17 DIAGNOSIS — M6281 Muscle weakness (generalized): Secondary | ICD-10-CM | POA: Diagnosis not present

## 2019-03-21 DIAGNOSIS — R293 Abnormal posture: Secondary | ICD-10-CM | POA: Diagnosis not present

## 2019-03-21 DIAGNOSIS — R279 Unspecified lack of coordination: Secondary | ICD-10-CM | POA: Diagnosis not present

## 2019-03-21 DIAGNOSIS — M6281 Muscle weakness (generalized): Secondary | ICD-10-CM | POA: Diagnosis not present

## 2019-03-21 DIAGNOSIS — E119 Type 2 diabetes mellitus without complications: Secondary | ICD-10-CM | POA: Diagnosis not present

## 2019-03-21 DIAGNOSIS — K219 Gastro-esophageal reflux disease without esophagitis: Secondary | ICD-10-CM | POA: Diagnosis not present

## 2019-03-21 DIAGNOSIS — J449 Chronic obstructive pulmonary disease, unspecified: Secondary | ICD-10-CM | POA: Diagnosis not present

## 2019-03-21 DIAGNOSIS — R1312 Dysphagia, oropharyngeal phase: Secondary | ICD-10-CM | POA: Diagnosis not present

## 2019-03-21 DIAGNOSIS — I69354 Hemiplegia and hemiparesis following cerebral infarction affecting left non-dominant side: Secondary | ICD-10-CM | POA: Diagnosis not present

## 2019-03-22 DIAGNOSIS — R279 Unspecified lack of coordination: Secondary | ICD-10-CM | POA: Diagnosis not present

## 2019-03-22 DIAGNOSIS — R293 Abnormal posture: Secondary | ICD-10-CM | POA: Diagnosis not present

## 2019-03-22 DIAGNOSIS — E119 Type 2 diabetes mellitus without complications: Secondary | ICD-10-CM | POA: Diagnosis not present

## 2019-03-22 DIAGNOSIS — J449 Chronic obstructive pulmonary disease, unspecified: Secondary | ICD-10-CM | POA: Diagnosis not present

## 2019-03-22 DIAGNOSIS — R1312 Dysphagia, oropharyngeal phase: Secondary | ICD-10-CM | POA: Diagnosis not present

## 2019-03-22 DIAGNOSIS — I69354 Hemiplegia and hemiparesis following cerebral infarction affecting left non-dominant side: Secondary | ICD-10-CM | POA: Diagnosis not present

## 2019-03-22 DIAGNOSIS — K219 Gastro-esophageal reflux disease without esophagitis: Secondary | ICD-10-CM | POA: Diagnosis not present

## 2019-03-22 DIAGNOSIS — M6281 Muscle weakness (generalized): Secondary | ICD-10-CM | POA: Diagnosis not present

## 2019-03-24 DIAGNOSIS — R293 Abnormal posture: Secondary | ICD-10-CM | POA: Diagnosis not present

## 2019-03-24 DIAGNOSIS — J449 Chronic obstructive pulmonary disease, unspecified: Secondary | ICD-10-CM | POA: Diagnosis not present

## 2019-03-24 DIAGNOSIS — K219 Gastro-esophageal reflux disease without esophagitis: Secondary | ICD-10-CM | POA: Diagnosis not present

## 2019-03-24 DIAGNOSIS — I69354 Hemiplegia and hemiparesis following cerebral infarction affecting left non-dominant side: Secondary | ICD-10-CM | POA: Diagnosis not present

## 2019-03-24 DIAGNOSIS — M6281 Muscle weakness (generalized): Secondary | ICD-10-CM | POA: Diagnosis not present

## 2019-03-24 DIAGNOSIS — R279 Unspecified lack of coordination: Secondary | ICD-10-CM | POA: Diagnosis not present

## 2019-03-24 DIAGNOSIS — E119 Type 2 diabetes mellitus without complications: Secondary | ICD-10-CM | POA: Diagnosis not present

## 2019-03-24 DIAGNOSIS — R1312 Dysphagia, oropharyngeal phase: Secondary | ICD-10-CM | POA: Diagnosis not present

## 2019-03-25 DIAGNOSIS — L89323 Pressure ulcer of left buttock, stage 3: Secondary | ICD-10-CM | POA: Diagnosis not present

## 2019-03-26 DIAGNOSIS — R279 Unspecified lack of coordination: Secondary | ICD-10-CM | POA: Diagnosis not present

## 2019-03-26 DIAGNOSIS — M6281 Muscle weakness (generalized): Secondary | ICD-10-CM | POA: Diagnosis not present

## 2019-03-26 DIAGNOSIS — K219 Gastro-esophageal reflux disease without esophagitis: Secondary | ICD-10-CM | POA: Diagnosis not present

## 2019-03-26 DIAGNOSIS — E119 Type 2 diabetes mellitus without complications: Secondary | ICD-10-CM | POA: Diagnosis not present

## 2019-03-26 DIAGNOSIS — I69354 Hemiplegia and hemiparesis following cerebral infarction affecting left non-dominant side: Secondary | ICD-10-CM | POA: Diagnosis not present

## 2019-03-26 DIAGNOSIS — R1312 Dysphagia, oropharyngeal phase: Secondary | ICD-10-CM | POA: Diagnosis not present

## 2019-03-26 DIAGNOSIS — J449 Chronic obstructive pulmonary disease, unspecified: Secondary | ICD-10-CM | POA: Diagnosis not present

## 2019-03-26 DIAGNOSIS — R293 Abnormal posture: Secondary | ICD-10-CM | POA: Diagnosis not present

## 2019-03-27 DIAGNOSIS — I69354 Hemiplegia and hemiparesis following cerebral infarction affecting left non-dominant side: Secondary | ICD-10-CM | POA: Diagnosis not present

## 2019-03-27 DIAGNOSIS — R293 Abnormal posture: Secondary | ICD-10-CM | POA: Diagnosis not present

## 2019-03-27 DIAGNOSIS — K219 Gastro-esophageal reflux disease without esophagitis: Secondary | ICD-10-CM | POA: Diagnosis not present

## 2019-03-27 DIAGNOSIS — J449 Chronic obstructive pulmonary disease, unspecified: Secondary | ICD-10-CM | POA: Diagnosis not present

## 2019-03-27 DIAGNOSIS — R279 Unspecified lack of coordination: Secondary | ICD-10-CM | POA: Diagnosis not present

## 2019-03-27 DIAGNOSIS — E119 Type 2 diabetes mellitus without complications: Secondary | ICD-10-CM | POA: Diagnosis not present

## 2019-03-27 DIAGNOSIS — I10 Essential (primary) hypertension: Secondary | ICD-10-CM | POA: Diagnosis not present

## 2019-03-27 DIAGNOSIS — R1312 Dysphagia, oropharyngeal phase: Secondary | ICD-10-CM | POA: Diagnosis not present

## 2019-03-27 DIAGNOSIS — S31829D Unspecified open wound of left buttock, subsequent encounter: Secondary | ICD-10-CM | POA: Diagnosis not present

## 2019-03-27 DIAGNOSIS — M6281 Muscle weakness (generalized): Secondary | ICD-10-CM | POA: Diagnosis not present

## 2019-03-31 DIAGNOSIS — I69354 Hemiplegia and hemiparesis following cerebral infarction affecting left non-dominant side: Secondary | ICD-10-CM | POA: Diagnosis not present

## 2019-03-31 DIAGNOSIS — R279 Unspecified lack of coordination: Secondary | ICD-10-CM | POA: Diagnosis not present

## 2019-03-31 DIAGNOSIS — M6281 Muscle weakness (generalized): Secondary | ICD-10-CM | POA: Diagnosis not present

## 2019-03-31 DIAGNOSIS — R293 Abnormal posture: Secondary | ICD-10-CM | POA: Diagnosis not present

## 2019-03-31 DIAGNOSIS — K219 Gastro-esophageal reflux disease without esophagitis: Secondary | ICD-10-CM | POA: Diagnosis not present

## 2019-03-31 DIAGNOSIS — J449 Chronic obstructive pulmonary disease, unspecified: Secondary | ICD-10-CM | POA: Diagnosis not present

## 2019-03-31 DIAGNOSIS — R1312 Dysphagia, oropharyngeal phase: Secondary | ICD-10-CM | POA: Diagnosis not present

## 2019-03-31 DIAGNOSIS — E119 Type 2 diabetes mellitus without complications: Secondary | ICD-10-CM | POA: Diagnosis not present

## 2019-04-01 DIAGNOSIS — L89323 Pressure ulcer of left buttock, stage 3: Secondary | ICD-10-CM | POA: Diagnosis not present

## 2019-04-01 DIAGNOSIS — R293 Abnormal posture: Secondary | ICD-10-CM | POA: Diagnosis not present

## 2019-04-01 DIAGNOSIS — I69354 Hemiplegia and hemiparesis following cerebral infarction affecting left non-dominant side: Secondary | ICD-10-CM | POA: Diagnosis not present

## 2019-04-01 DIAGNOSIS — E119 Type 2 diabetes mellitus without complications: Secondary | ICD-10-CM | POA: Diagnosis not present

## 2019-04-01 DIAGNOSIS — R279 Unspecified lack of coordination: Secondary | ICD-10-CM | POA: Diagnosis not present

## 2019-04-01 DIAGNOSIS — K219 Gastro-esophageal reflux disease without esophagitis: Secondary | ICD-10-CM | POA: Diagnosis not present

## 2019-04-01 DIAGNOSIS — J449 Chronic obstructive pulmonary disease, unspecified: Secondary | ICD-10-CM | POA: Diagnosis not present

## 2019-04-01 DIAGNOSIS — R1312 Dysphagia, oropharyngeal phase: Secondary | ICD-10-CM | POA: Diagnosis not present

## 2019-04-01 DIAGNOSIS — M6281 Muscle weakness (generalized): Secondary | ICD-10-CM | POA: Diagnosis not present

## 2019-04-03 DIAGNOSIS — J449 Chronic obstructive pulmonary disease, unspecified: Secondary | ICD-10-CM | POA: Diagnosis not present

## 2019-04-03 DIAGNOSIS — R1312 Dysphagia, oropharyngeal phase: Secondary | ICD-10-CM | POA: Diagnosis not present

## 2019-04-03 DIAGNOSIS — E119 Type 2 diabetes mellitus without complications: Secondary | ICD-10-CM | POA: Diagnosis not present

## 2019-04-03 DIAGNOSIS — I69354 Hemiplegia and hemiparesis following cerebral infarction affecting left non-dominant side: Secondary | ICD-10-CM | POA: Diagnosis not present

## 2019-04-03 DIAGNOSIS — R279 Unspecified lack of coordination: Secondary | ICD-10-CM | POA: Diagnosis not present

## 2019-04-03 DIAGNOSIS — K219 Gastro-esophageal reflux disease without esophagitis: Secondary | ICD-10-CM | POA: Diagnosis not present

## 2019-04-03 DIAGNOSIS — M6281 Muscle weakness (generalized): Secondary | ICD-10-CM | POA: Diagnosis not present

## 2019-04-03 DIAGNOSIS — R293 Abnormal posture: Secondary | ICD-10-CM | POA: Diagnosis not present

## 2019-04-04 DIAGNOSIS — R293 Abnormal posture: Secondary | ICD-10-CM | POA: Diagnosis not present

## 2019-04-04 DIAGNOSIS — E119 Type 2 diabetes mellitus without complications: Secondary | ICD-10-CM | POA: Diagnosis not present

## 2019-04-04 DIAGNOSIS — E039 Hypothyroidism, unspecified: Secondary | ICD-10-CM | POA: Diagnosis not present

## 2019-04-04 DIAGNOSIS — I10 Essential (primary) hypertension: Secondary | ICD-10-CM | POA: Diagnosis not present

## 2019-04-04 DIAGNOSIS — E1122 Type 2 diabetes mellitus with diabetic chronic kidney disease: Secondary | ICD-10-CM | POA: Diagnosis not present

## 2019-04-04 DIAGNOSIS — E0800 Diabetes mellitus due to underlying condition with hyperosmolarity without nonketotic hyperglycemic-hyperosmolar coma (NKHHC): Secondary | ICD-10-CM | POA: Diagnosis not present

## 2019-04-04 DIAGNOSIS — D508 Other iron deficiency anemias: Secondary | ICD-10-CM | POA: Diagnosis not present

## 2019-04-04 DIAGNOSIS — R918 Other nonspecific abnormal finding of lung field: Secondary | ICD-10-CM | POA: Diagnosis not present

## 2019-04-04 DIAGNOSIS — J449 Chronic obstructive pulmonary disease, unspecified: Secondary | ICD-10-CM | POA: Diagnosis not present

## 2019-04-04 DIAGNOSIS — I69354 Hemiplegia and hemiparesis following cerebral infarction affecting left non-dominant side: Secondary | ICD-10-CM | POA: Diagnosis not present

## 2019-04-04 DIAGNOSIS — M6281 Muscle weakness (generalized): Secondary | ICD-10-CM | POA: Diagnosis not present

## 2019-04-04 DIAGNOSIS — R279 Unspecified lack of coordination: Secondary | ICD-10-CM | POA: Diagnosis not present

## 2019-04-04 DIAGNOSIS — J189 Pneumonia, unspecified organism: Secondary | ICD-10-CM | POA: Diagnosis not present

## 2019-04-04 DIAGNOSIS — K219 Gastro-esophageal reflux disease without esophagitis: Secondary | ICD-10-CM | POA: Diagnosis not present

## 2019-04-04 DIAGNOSIS — L89323 Pressure ulcer of left buttock, stage 3: Secondary | ICD-10-CM | POA: Diagnosis not present

## 2019-04-04 DIAGNOSIS — R1312 Dysphagia, oropharyngeal phase: Secondary | ICD-10-CM | POA: Diagnosis not present

## 2019-04-07 DIAGNOSIS — R279 Unspecified lack of coordination: Secondary | ICD-10-CM | POA: Diagnosis not present

## 2019-04-07 DIAGNOSIS — E119 Type 2 diabetes mellitus without complications: Secondary | ICD-10-CM | POA: Diagnosis not present

## 2019-04-07 DIAGNOSIS — I69354 Hemiplegia and hemiparesis following cerebral infarction affecting left non-dominant side: Secondary | ICD-10-CM | POA: Diagnosis not present

## 2019-04-07 DIAGNOSIS — K219 Gastro-esophageal reflux disease without esophagitis: Secondary | ICD-10-CM | POA: Diagnosis not present

## 2019-04-07 DIAGNOSIS — D508 Other iron deficiency anemias: Secondary | ICD-10-CM | POA: Diagnosis not present

## 2019-04-07 DIAGNOSIS — R1312 Dysphagia, oropharyngeal phase: Secondary | ICD-10-CM | POA: Diagnosis not present

## 2019-04-07 DIAGNOSIS — R293 Abnormal posture: Secondary | ICD-10-CM | POA: Diagnosis not present

## 2019-04-07 DIAGNOSIS — J449 Chronic obstructive pulmonary disease, unspecified: Secondary | ICD-10-CM | POA: Diagnosis not present

## 2019-04-07 DIAGNOSIS — J189 Pneumonia, unspecified organism: Secondary | ICD-10-CM | POA: Diagnosis not present

## 2019-04-07 DIAGNOSIS — I10 Essential (primary) hypertension: Secondary | ICD-10-CM | POA: Diagnosis not present

## 2019-04-07 DIAGNOSIS — M6281 Muscle weakness (generalized): Secondary | ICD-10-CM | POA: Diagnosis not present

## 2019-04-08 DIAGNOSIS — M6281 Muscle weakness (generalized): Secondary | ICD-10-CM | POA: Diagnosis not present

## 2019-04-08 DIAGNOSIS — K219 Gastro-esophageal reflux disease without esophagitis: Secondary | ICD-10-CM | POA: Diagnosis not present

## 2019-04-08 DIAGNOSIS — I69354 Hemiplegia and hemiparesis following cerebral infarction affecting left non-dominant side: Secondary | ICD-10-CM | POA: Diagnosis not present

## 2019-04-08 DIAGNOSIS — J189 Pneumonia, unspecified organism: Secondary | ICD-10-CM | POA: Diagnosis not present

## 2019-04-08 DIAGNOSIS — R279 Unspecified lack of coordination: Secondary | ICD-10-CM | POA: Diagnosis not present

## 2019-04-08 DIAGNOSIS — R293 Abnormal posture: Secondary | ICD-10-CM | POA: Diagnosis not present

## 2019-04-08 DIAGNOSIS — E119 Type 2 diabetes mellitus without complications: Secondary | ICD-10-CM | POA: Diagnosis not present

## 2019-04-08 DIAGNOSIS — J449 Chronic obstructive pulmonary disease, unspecified: Secondary | ICD-10-CM | POA: Diagnosis not present

## 2019-04-08 DIAGNOSIS — L89323 Pressure ulcer of left buttock, stage 3: Secondary | ICD-10-CM | POA: Diagnosis not present

## 2019-04-08 DIAGNOSIS — R1312 Dysphagia, oropharyngeal phase: Secondary | ICD-10-CM | POA: Diagnosis not present

## 2019-04-09 DIAGNOSIS — I69354 Hemiplegia and hemiparesis following cerebral infarction affecting left non-dominant side: Secondary | ICD-10-CM | POA: Diagnosis not present

## 2019-04-09 DIAGNOSIS — E119 Type 2 diabetes mellitus without complications: Secondary | ICD-10-CM | POA: Diagnosis not present

## 2019-04-09 DIAGNOSIS — K219 Gastro-esophageal reflux disease without esophagitis: Secondary | ICD-10-CM | POA: Diagnosis not present

## 2019-04-09 DIAGNOSIS — R279 Unspecified lack of coordination: Secondary | ICD-10-CM | POA: Diagnosis not present

## 2019-04-09 DIAGNOSIS — R293 Abnormal posture: Secondary | ICD-10-CM | POA: Diagnosis not present

## 2019-04-09 DIAGNOSIS — M6281 Muscle weakness (generalized): Secondary | ICD-10-CM | POA: Diagnosis not present

## 2019-04-09 DIAGNOSIS — J449 Chronic obstructive pulmonary disease, unspecified: Secondary | ICD-10-CM | POA: Diagnosis not present

## 2019-04-09 DIAGNOSIS — R1312 Dysphagia, oropharyngeal phase: Secondary | ICD-10-CM | POA: Diagnosis not present

## 2019-04-10 DIAGNOSIS — R279 Unspecified lack of coordination: Secondary | ICD-10-CM | POA: Diagnosis not present

## 2019-04-10 DIAGNOSIS — E119 Type 2 diabetes mellitus without complications: Secondary | ICD-10-CM | POA: Diagnosis not present

## 2019-04-10 DIAGNOSIS — R1312 Dysphagia, oropharyngeal phase: Secondary | ICD-10-CM | POA: Diagnosis not present

## 2019-04-10 DIAGNOSIS — J449 Chronic obstructive pulmonary disease, unspecified: Secondary | ICD-10-CM | POA: Diagnosis not present

## 2019-04-10 DIAGNOSIS — M6281 Muscle weakness (generalized): Secondary | ICD-10-CM | POA: Diagnosis not present

## 2019-04-10 DIAGNOSIS — R293 Abnormal posture: Secondary | ICD-10-CM | POA: Diagnosis not present

## 2019-04-10 DIAGNOSIS — I69354 Hemiplegia and hemiparesis following cerebral infarction affecting left non-dominant side: Secondary | ICD-10-CM | POA: Diagnosis not present

## 2019-04-10 DIAGNOSIS — K219 Gastro-esophageal reflux disease without esophagitis: Secondary | ICD-10-CM | POA: Diagnosis not present

## 2019-04-11 DIAGNOSIS — J449 Chronic obstructive pulmonary disease, unspecified: Secondary | ICD-10-CM | POA: Diagnosis not present

## 2019-04-11 DIAGNOSIS — M6281 Muscle weakness (generalized): Secondary | ICD-10-CM | POA: Diagnosis not present

## 2019-04-11 DIAGNOSIS — R293 Abnormal posture: Secondary | ICD-10-CM | POA: Diagnosis not present

## 2019-04-11 DIAGNOSIS — K219 Gastro-esophageal reflux disease without esophagitis: Secondary | ICD-10-CM | POA: Diagnosis not present

## 2019-04-11 DIAGNOSIS — R1312 Dysphagia, oropharyngeal phase: Secondary | ICD-10-CM | POA: Diagnosis not present

## 2019-04-11 DIAGNOSIS — E119 Type 2 diabetes mellitus without complications: Secondary | ICD-10-CM | POA: Diagnosis not present

## 2019-04-11 DIAGNOSIS — R279 Unspecified lack of coordination: Secondary | ICD-10-CM | POA: Diagnosis not present

## 2019-04-11 DIAGNOSIS — I69354 Hemiplegia and hemiparesis following cerebral infarction affecting left non-dominant side: Secondary | ICD-10-CM | POA: Diagnosis not present

## 2019-04-14 DIAGNOSIS — R1312 Dysphagia, oropharyngeal phase: Secondary | ICD-10-CM | POA: Diagnosis not present

## 2019-04-14 DIAGNOSIS — M6281 Muscle weakness (generalized): Secondary | ICD-10-CM | POA: Diagnosis not present

## 2019-04-14 DIAGNOSIS — R293 Abnormal posture: Secondary | ICD-10-CM | POA: Diagnosis not present

## 2019-04-14 DIAGNOSIS — J449 Chronic obstructive pulmonary disease, unspecified: Secondary | ICD-10-CM | POA: Diagnosis not present

## 2019-04-14 DIAGNOSIS — K219 Gastro-esophageal reflux disease without esophagitis: Secondary | ICD-10-CM | POA: Diagnosis not present

## 2019-04-14 DIAGNOSIS — R279 Unspecified lack of coordination: Secondary | ICD-10-CM | POA: Diagnosis not present

## 2019-04-14 DIAGNOSIS — E119 Type 2 diabetes mellitus without complications: Secondary | ICD-10-CM | POA: Diagnosis not present

## 2019-04-14 DIAGNOSIS — I69354 Hemiplegia and hemiparesis following cerebral infarction affecting left non-dominant side: Secondary | ICD-10-CM | POA: Diagnosis not present

## 2019-04-15 DIAGNOSIS — R1312 Dysphagia, oropharyngeal phase: Secondary | ICD-10-CM | POA: Diagnosis not present

## 2019-04-15 DIAGNOSIS — R279 Unspecified lack of coordination: Secondary | ICD-10-CM | POA: Diagnosis not present

## 2019-04-15 DIAGNOSIS — I69354 Hemiplegia and hemiparesis following cerebral infarction affecting left non-dominant side: Secondary | ICD-10-CM | POA: Diagnosis not present

## 2019-04-15 DIAGNOSIS — E119 Type 2 diabetes mellitus without complications: Secondary | ICD-10-CM | POA: Diagnosis not present

## 2019-04-15 DIAGNOSIS — M6281 Muscle weakness (generalized): Secondary | ICD-10-CM | POA: Diagnosis not present

## 2019-04-15 DIAGNOSIS — J449 Chronic obstructive pulmonary disease, unspecified: Secondary | ICD-10-CM | POA: Diagnosis not present

## 2019-04-15 DIAGNOSIS — K219 Gastro-esophageal reflux disease without esophagitis: Secondary | ICD-10-CM | POA: Diagnosis not present

## 2019-04-15 DIAGNOSIS — R293 Abnormal posture: Secondary | ICD-10-CM | POA: Diagnosis not present

## 2019-04-17 DIAGNOSIS — I69354 Hemiplegia and hemiparesis following cerebral infarction affecting left non-dominant side: Secondary | ICD-10-CM | POA: Diagnosis not present

## 2019-04-17 DIAGNOSIS — K219 Gastro-esophageal reflux disease without esophagitis: Secondary | ICD-10-CM | POA: Diagnosis not present

## 2019-04-17 DIAGNOSIS — R279 Unspecified lack of coordination: Secondary | ICD-10-CM | POA: Diagnosis not present

## 2019-04-17 DIAGNOSIS — E119 Type 2 diabetes mellitus without complications: Secondary | ICD-10-CM | POA: Diagnosis not present

## 2019-04-17 DIAGNOSIS — R293 Abnormal posture: Secondary | ICD-10-CM | POA: Diagnosis not present

## 2019-04-17 DIAGNOSIS — J449 Chronic obstructive pulmonary disease, unspecified: Secondary | ICD-10-CM | POA: Diagnosis not present

## 2019-04-17 DIAGNOSIS — M6281 Muscle weakness (generalized): Secondary | ICD-10-CM | POA: Diagnosis not present

## 2019-04-17 DIAGNOSIS — R1312 Dysphagia, oropharyngeal phase: Secondary | ICD-10-CM | POA: Diagnosis not present

## 2019-04-18 DIAGNOSIS — I69354 Hemiplegia and hemiparesis following cerebral infarction affecting left non-dominant side: Secondary | ICD-10-CM | POA: Diagnosis not present

## 2019-04-21 DIAGNOSIS — R1312 Dysphagia, oropharyngeal phase: Secondary | ICD-10-CM | POA: Diagnosis not present

## 2019-04-21 DIAGNOSIS — E119 Type 2 diabetes mellitus without complications: Secondary | ICD-10-CM | POA: Diagnosis not present

## 2019-04-21 DIAGNOSIS — L89153 Pressure ulcer of sacral region, stage 3: Secondary | ICD-10-CM | POA: Diagnosis not present

## 2019-04-21 DIAGNOSIS — R293 Abnormal posture: Secondary | ICD-10-CM | POA: Diagnosis not present

## 2019-04-21 DIAGNOSIS — R279 Unspecified lack of coordination: Secondary | ICD-10-CM | POA: Diagnosis not present

## 2019-04-21 DIAGNOSIS — M6281 Muscle weakness (generalized): Secondary | ICD-10-CM | POA: Diagnosis not present

## 2019-04-21 DIAGNOSIS — J449 Chronic obstructive pulmonary disease, unspecified: Secondary | ICD-10-CM | POA: Diagnosis not present

## 2019-04-21 DIAGNOSIS — I69354 Hemiplegia and hemiparesis following cerebral infarction affecting left non-dominant side: Secondary | ICD-10-CM | POA: Diagnosis not present

## 2019-04-21 DIAGNOSIS — K219 Gastro-esophageal reflux disease without esophagitis: Secondary | ICD-10-CM | POA: Diagnosis not present

## 2019-04-22 DIAGNOSIS — J449 Chronic obstructive pulmonary disease, unspecified: Secondary | ICD-10-CM | POA: Diagnosis not present

## 2019-04-22 DIAGNOSIS — E119 Type 2 diabetes mellitus without complications: Secondary | ICD-10-CM | POA: Diagnosis not present

## 2019-04-22 DIAGNOSIS — R1312 Dysphagia, oropharyngeal phase: Secondary | ICD-10-CM | POA: Diagnosis not present

## 2019-04-22 DIAGNOSIS — R279 Unspecified lack of coordination: Secondary | ICD-10-CM | POA: Diagnosis not present

## 2019-04-22 DIAGNOSIS — I69354 Hemiplegia and hemiparesis following cerebral infarction affecting left non-dominant side: Secondary | ICD-10-CM | POA: Diagnosis not present

## 2019-04-22 DIAGNOSIS — M6281 Muscle weakness (generalized): Secondary | ICD-10-CM | POA: Diagnosis not present

## 2019-04-22 DIAGNOSIS — K219 Gastro-esophageal reflux disease without esophagitis: Secondary | ICD-10-CM | POA: Diagnosis not present

## 2019-04-22 DIAGNOSIS — R293 Abnormal posture: Secondary | ICD-10-CM | POA: Diagnosis not present

## 2019-04-24 DIAGNOSIS — R1312 Dysphagia, oropharyngeal phase: Secondary | ICD-10-CM | POA: Diagnosis not present

## 2019-04-24 DIAGNOSIS — R293 Abnormal posture: Secondary | ICD-10-CM | POA: Diagnosis not present

## 2019-04-24 DIAGNOSIS — I69354 Hemiplegia and hemiparesis following cerebral infarction affecting left non-dominant side: Secondary | ICD-10-CM | POA: Diagnosis not present

## 2019-04-24 DIAGNOSIS — K219 Gastro-esophageal reflux disease without esophagitis: Secondary | ICD-10-CM | POA: Diagnosis not present

## 2019-04-24 DIAGNOSIS — E119 Type 2 diabetes mellitus without complications: Secondary | ICD-10-CM | POA: Diagnosis not present

## 2019-04-24 DIAGNOSIS — J449 Chronic obstructive pulmonary disease, unspecified: Secondary | ICD-10-CM | POA: Diagnosis not present

## 2019-04-24 DIAGNOSIS — M6281 Muscle weakness (generalized): Secondary | ICD-10-CM | POA: Diagnosis not present

## 2019-04-24 DIAGNOSIS — R279 Unspecified lack of coordination: Secondary | ICD-10-CM | POA: Diagnosis not present

## 2019-04-28 DIAGNOSIS — L89153 Pressure ulcer of sacral region, stage 3: Secondary | ICD-10-CM | POA: Diagnosis not present

## 2019-04-29 DIAGNOSIS — E119 Type 2 diabetes mellitus without complications: Secondary | ICD-10-CM | POA: Diagnosis not present

## 2019-04-29 DIAGNOSIS — R1312 Dysphagia, oropharyngeal phase: Secondary | ICD-10-CM | POA: Diagnosis not present

## 2019-04-29 DIAGNOSIS — I69354 Hemiplegia and hemiparesis following cerebral infarction affecting left non-dominant side: Secondary | ICD-10-CM | POA: Diagnosis not present

## 2019-04-29 DIAGNOSIS — K219 Gastro-esophageal reflux disease without esophagitis: Secondary | ICD-10-CM | POA: Diagnosis not present

## 2019-04-29 DIAGNOSIS — R293 Abnormal posture: Secondary | ICD-10-CM | POA: Diagnosis not present

## 2019-04-29 DIAGNOSIS — M6281 Muscle weakness (generalized): Secondary | ICD-10-CM | POA: Diagnosis not present

## 2019-04-29 DIAGNOSIS — R279 Unspecified lack of coordination: Secondary | ICD-10-CM | POA: Diagnosis not present

## 2019-04-29 DIAGNOSIS — J449 Chronic obstructive pulmonary disease, unspecified: Secondary | ICD-10-CM | POA: Diagnosis not present

## 2019-04-30 DIAGNOSIS — E119 Type 2 diabetes mellitus without complications: Secondary | ICD-10-CM | POA: Diagnosis not present

## 2019-04-30 DIAGNOSIS — J449 Chronic obstructive pulmonary disease, unspecified: Secondary | ICD-10-CM | POA: Diagnosis not present

## 2019-04-30 DIAGNOSIS — M6281 Muscle weakness (generalized): Secondary | ICD-10-CM | POA: Diagnosis not present

## 2019-04-30 DIAGNOSIS — R293 Abnormal posture: Secondary | ICD-10-CM | POA: Diagnosis not present

## 2019-04-30 DIAGNOSIS — R1312 Dysphagia, oropharyngeal phase: Secondary | ICD-10-CM | POA: Diagnosis not present

## 2019-04-30 DIAGNOSIS — K219 Gastro-esophageal reflux disease without esophagitis: Secondary | ICD-10-CM | POA: Diagnosis not present

## 2019-04-30 DIAGNOSIS — R279 Unspecified lack of coordination: Secondary | ICD-10-CM | POA: Diagnosis not present

## 2019-04-30 DIAGNOSIS — I69354 Hemiplegia and hemiparesis following cerebral infarction affecting left non-dominant side: Secondary | ICD-10-CM | POA: Diagnosis not present

## 2019-05-01 DIAGNOSIS — R279 Unspecified lack of coordination: Secondary | ICD-10-CM | POA: Diagnosis not present

## 2019-05-01 DIAGNOSIS — E119 Type 2 diabetes mellitus without complications: Secondary | ICD-10-CM | POA: Diagnosis not present

## 2019-05-01 DIAGNOSIS — R1312 Dysphagia, oropharyngeal phase: Secondary | ICD-10-CM | POA: Diagnosis not present

## 2019-05-01 DIAGNOSIS — R293 Abnormal posture: Secondary | ICD-10-CM | POA: Diagnosis not present

## 2019-05-01 DIAGNOSIS — I69354 Hemiplegia and hemiparesis following cerebral infarction affecting left non-dominant side: Secondary | ICD-10-CM | POA: Diagnosis not present

## 2019-05-01 DIAGNOSIS — M6281 Muscle weakness (generalized): Secondary | ICD-10-CM | POA: Diagnosis not present

## 2019-05-01 DIAGNOSIS — J449 Chronic obstructive pulmonary disease, unspecified: Secondary | ICD-10-CM | POA: Diagnosis not present

## 2019-05-01 DIAGNOSIS — K219 Gastro-esophageal reflux disease without esophagitis: Secondary | ICD-10-CM | POA: Diagnosis not present

## 2019-05-05 DIAGNOSIS — M6281 Muscle weakness (generalized): Secondary | ICD-10-CM | POA: Diagnosis not present

## 2019-05-05 DIAGNOSIS — R279 Unspecified lack of coordination: Secondary | ICD-10-CM | POA: Diagnosis not present

## 2019-05-05 DIAGNOSIS — E0965 Drug or chemical induced diabetes mellitus with hyperglycemia: Secondary | ICD-10-CM | POA: Diagnosis not present

## 2019-05-05 DIAGNOSIS — I69354 Hemiplegia and hemiparesis following cerebral infarction affecting left non-dominant side: Secondary | ICD-10-CM | POA: Diagnosis not present

## 2019-05-05 DIAGNOSIS — R1312 Dysphagia, oropharyngeal phase: Secondary | ICD-10-CM | POA: Diagnosis not present

## 2019-05-05 DIAGNOSIS — E119 Type 2 diabetes mellitus without complications: Secondary | ICD-10-CM | POA: Diagnosis not present

## 2019-05-05 DIAGNOSIS — K219 Gastro-esophageal reflux disease without esophagitis: Secondary | ICD-10-CM | POA: Diagnosis not present

## 2019-05-05 DIAGNOSIS — L89153 Pressure ulcer of sacral region, stage 3: Secondary | ICD-10-CM | POA: Diagnosis not present

## 2019-05-05 DIAGNOSIS — J449 Chronic obstructive pulmonary disease, unspecified: Secondary | ICD-10-CM | POA: Diagnosis not present

## 2019-05-05 DIAGNOSIS — E1165 Type 2 diabetes mellitus with hyperglycemia: Secondary | ICD-10-CM | POA: Diagnosis not present

## 2019-05-05 DIAGNOSIS — R293 Abnormal posture: Secondary | ICD-10-CM | POA: Diagnosis not present

## 2019-05-06 DIAGNOSIS — R1312 Dysphagia, oropharyngeal phase: Secondary | ICD-10-CM | POA: Diagnosis not present

## 2019-05-06 DIAGNOSIS — E119 Type 2 diabetes mellitus without complications: Secondary | ICD-10-CM | POA: Diagnosis not present

## 2019-05-06 DIAGNOSIS — R293 Abnormal posture: Secondary | ICD-10-CM | POA: Diagnosis not present

## 2019-05-06 DIAGNOSIS — R279 Unspecified lack of coordination: Secondary | ICD-10-CM | POA: Diagnosis not present

## 2019-05-06 DIAGNOSIS — K219 Gastro-esophageal reflux disease without esophagitis: Secondary | ICD-10-CM | POA: Diagnosis not present

## 2019-05-06 DIAGNOSIS — I69354 Hemiplegia and hemiparesis following cerebral infarction affecting left non-dominant side: Secondary | ICD-10-CM | POA: Diagnosis not present

## 2019-05-06 DIAGNOSIS — J449 Chronic obstructive pulmonary disease, unspecified: Secondary | ICD-10-CM | POA: Diagnosis not present

## 2019-05-06 DIAGNOSIS — M6281 Muscle weakness (generalized): Secondary | ICD-10-CM | POA: Diagnosis not present

## 2019-05-07 DIAGNOSIS — J449 Chronic obstructive pulmonary disease, unspecified: Secondary | ICD-10-CM | POA: Diagnosis not present

## 2019-05-07 DIAGNOSIS — I69354 Hemiplegia and hemiparesis following cerebral infarction affecting left non-dominant side: Secondary | ICD-10-CM | POA: Diagnosis not present

## 2019-05-07 DIAGNOSIS — R293 Abnormal posture: Secondary | ICD-10-CM | POA: Diagnosis not present

## 2019-05-07 DIAGNOSIS — K219 Gastro-esophageal reflux disease without esophagitis: Secondary | ICD-10-CM | POA: Diagnosis not present

## 2019-05-07 DIAGNOSIS — M6281 Muscle weakness (generalized): Secondary | ICD-10-CM | POA: Diagnosis not present

## 2019-05-07 DIAGNOSIS — E119 Type 2 diabetes mellitus without complications: Secondary | ICD-10-CM | POA: Diagnosis not present

## 2019-05-07 DIAGNOSIS — R1312 Dysphagia, oropharyngeal phase: Secondary | ICD-10-CM | POA: Diagnosis not present

## 2019-05-07 DIAGNOSIS — R279 Unspecified lack of coordination: Secondary | ICD-10-CM | POA: Diagnosis not present

## 2019-05-09 DIAGNOSIS — R279 Unspecified lack of coordination: Secondary | ICD-10-CM | POA: Diagnosis not present

## 2019-05-09 DIAGNOSIS — J449 Chronic obstructive pulmonary disease, unspecified: Secondary | ICD-10-CM | POA: Diagnosis not present

## 2019-05-09 DIAGNOSIS — I69354 Hemiplegia and hemiparesis following cerebral infarction affecting left non-dominant side: Secondary | ICD-10-CM | POA: Diagnosis not present

## 2019-05-09 DIAGNOSIS — E119 Type 2 diabetes mellitus without complications: Secondary | ICD-10-CM | POA: Diagnosis not present

## 2019-05-09 DIAGNOSIS — R1312 Dysphagia, oropharyngeal phase: Secondary | ICD-10-CM | POA: Diagnosis not present

## 2019-05-09 DIAGNOSIS — R293 Abnormal posture: Secondary | ICD-10-CM | POA: Diagnosis not present

## 2019-05-09 DIAGNOSIS — K219 Gastro-esophageal reflux disease without esophagitis: Secondary | ICD-10-CM | POA: Diagnosis not present

## 2019-05-09 DIAGNOSIS — M6281 Muscle weakness (generalized): Secondary | ICD-10-CM | POA: Diagnosis not present

## 2019-05-12 DIAGNOSIS — I69354 Hemiplegia and hemiparesis following cerebral infarction affecting left non-dominant side: Secondary | ICD-10-CM | POA: Diagnosis not present

## 2019-05-12 DIAGNOSIS — R279 Unspecified lack of coordination: Secondary | ICD-10-CM | POA: Diagnosis not present

## 2019-05-12 DIAGNOSIS — K219 Gastro-esophageal reflux disease without esophagitis: Secondary | ICD-10-CM | POA: Diagnosis not present

## 2019-05-12 DIAGNOSIS — J449 Chronic obstructive pulmonary disease, unspecified: Secondary | ICD-10-CM | POA: Diagnosis not present

## 2019-05-12 DIAGNOSIS — M6281 Muscle weakness (generalized): Secondary | ICD-10-CM | POA: Diagnosis not present

## 2019-05-12 DIAGNOSIS — E119 Type 2 diabetes mellitus without complications: Secondary | ICD-10-CM | POA: Diagnosis not present

## 2019-05-12 DIAGNOSIS — R293 Abnormal posture: Secondary | ICD-10-CM | POA: Diagnosis not present

## 2019-05-12 DIAGNOSIS — R1312 Dysphagia, oropharyngeal phase: Secondary | ICD-10-CM | POA: Diagnosis not present

## 2019-05-15 DIAGNOSIS — K219 Gastro-esophageal reflux disease without esophagitis: Secondary | ICD-10-CM | POA: Diagnosis not present

## 2019-05-15 DIAGNOSIS — I69354 Hemiplegia and hemiparesis following cerebral infarction affecting left non-dominant side: Secondary | ICD-10-CM | POA: Diagnosis not present

## 2019-05-15 DIAGNOSIS — E119 Type 2 diabetes mellitus without complications: Secondary | ICD-10-CM | POA: Diagnosis not present

## 2019-05-15 DIAGNOSIS — R1312 Dysphagia, oropharyngeal phase: Secondary | ICD-10-CM | POA: Diagnosis not present

## 2019-05-19 DIAGNOSIS — I69354 Hemiplegia and hemiparesis following cerebral infarction affecting left non-dominant side: Secondary | ICD-10-CM | POA: Diagnosis not present

## 2019-05-19 DIAGNOSIS — E119 Type 2 diabetes mellitus without complications: Secondary | ICD-10-CM | POA: Diagnosis not present

## 2019-05-19 DIAGNOSIS — K219 Gastro-esophageal reflux disease without esophagitis: Secondary | ICD-10-CM | POA: Diagnosis not present

## 2019-05-19 DIAGNOSIS — R1312 Dysphagia, oropharyngeal phase: Secondary | ICD-10-CM | POA: Diagnosis not present

## 2019-05-20 DIAGNOSIS — R1312 Dysphagia, oropharyngeal phase: Secondary | ICD-10-CM | POA: Diagnosis not present

## 2019-05-20 DIAGNOSIS — I69354 Hemiplegia and hemiparesis following cerebral infarction affecting left non-dominant side: Secondary | ICD-10-CM | POA: Diagnosis not present

## 2019-05-20 DIAGNOSIS — E119 Type 2 diabetes mellitus without complications: Secondary | ICD-10-CM | POA: Diagnosis not present

## 2019-05-20 DIAGNOSIS — K219 Gastro-esophageal reflux disease without esophagitis: Secondary | ICD-10-CM | POA: Diagnosis not present

## 2019-05-22 DIAGNOSIS — R1312 Dysphagia, oropharyngeal phase: Secondary | ICD-10-CM | POA: Diagnosis not present

## 2019-05-22 DIAGNOSIS — E119 Type 2 diabetes mellitus without complications: Secondary | ICD-10-CM | POA: Diagnosis not present

## 2019-05-22 DIAGNOSIS — J189 Pneumonia, unspecified organism: Secondary | ICD-10-CM | POA: Diagnosis not present

## 2019-05-22 DIAGNOSIS — K219 Gastro-esophageal reflux disease without esophagitis: Secondary | ICD-10-CM | POA: Diagnosis not present

## 2019-05-22 DIAGNOSIS — I69354 Hemiplegia and hemiparesis following cerebral infarction affecting left non-dominant side: Secondary | ICD-10-CM | POA: Diagnosis not present

## 2019-05-23 DIAGNOSIS — R0989 Other specified symptoms and signs involving the circulatory and respiratory systems: Secondary | ICD-10-CM | POA: Diagnosis not present

## 2019-05-23 DIAGNOSIS — E1165 Type 2 diabetes mellitus with hyperglycemia: Secondary | ICD-10-CM | POA: Diagnosis not present

## 2019-05-23 DIAGNOSIS — I69354 Hemiplegia and hemiparesis following cerebral infarction affecting left non-dominant side: Secondary | ICD-10-CM | POA: Diagnosis not present

## 2019-05-23 DIAGNOSIS — R918 Other nonspecific abnormal finding of lung field: Secondary | ICD-10-CM | POA: Diagnosis not present

## 2019-05-23 DIAGNOSIS — J189 Pneumonia, unspecified organism: Secondary | ICD-10-CM | POA: Diagnosis not present

## 2019-05-26 DIAGNOSIS — R1312 Dysphagia, oropharyngeal phase: Secondary | ICD-10-CM | POA: Diagnosis not present

## 2019-05-26 DIAGNOSIS — I69354 Hemiplegia and hemiparesis following cerebral infarction affecting left non-dominant side: Secondary | ICD-10-CM | POA: Diagnosis not present

## 2019-05-26 DIAGNOSIS — J189 Pneumonia, unspecified organism: Secondary | ICD-10-CM | POA: Diagnosis not present

## 2019-05-26 DIAGNOSIS — K219 Gastro-esophageal reflux disease without esophagitis: Secondary | ICD-10-CM | POA: Diagnosis not present

## 2019-05-26 DIAGNOSIS — E119 Type 2 diabetes mellitus without complications: Secondary | ICD-10-CM | POA: Diagnosis not present

## 2019-05-27 DIAGNOSIS — R1312 Dysphagia, oropharyngeal phase: Secondary | ICD-10-CM | POA: Diagnosis not present

## 2019-05-27 DIAGNOSIS — K219 Gastro-esophageal reflux disease without esophagitis: Secondary | ICD-10-CM | POA: Diagnosis not present

## 2019-05-27 DIAGNOSIS — I69354 Hemiplegia and hemiparesis following cerebral infarction affecting left non-dominant side: Secondary | ICD-10-CM | POA: Diagnosis not present

## 2019-05-27 DIAGNOSIS — E119 Type 2 diabetes mellitus without complications: Secondary | ICD-10-CM | POA: Diagnosis not present

## 2019-05-30 DIAGNOSIS — E119 Type 2 diabetes mellitus without complications: Secondary | ICD-10-CM | POA: Diagnosis not present

## 2019-05-30 DIAGNOSIS — I69354 Hemiplegia and hemiparesis following cerebral infarction affecting left non-dominant side: Secondary | ICD-10-CM | POA: Diagnosis not present

## 2019-05-30 DIAGNOSIS — K219 Gastro-esophageal reflux disease without esophagitis: Secondary | ICD-10-CM | POA: Diagnosis not present

## 2019-05-30 DIAGNOSIS — I739 Peripheral vascular disease, unspecified: Secondary | ICD-10-CM | POA: Diagnosis not present

## 2019-05-30 DIAGNOSIS — E1159 Type 2 diabetes mellitus with other circulatory complications: Secondary | ICD-10-CM | POA: Diagnosis not present

## 2019-05-30 DIAGNOSIS — B351 Tinea unguium: Secondary | ICD-10-CM | POA: Diagnosis not present

## 2019-05-30 DIAGNOSIS — Q845 Enlarged and hypertrophic nails: Secondary | ICD-10-CM | POA: Diagnosis not present

## 2019-05-30 DIAGNOSIS — R1312 Dysphagia, oropharyngeal phase: Secondary | ICD-10-CM | POA: Diagnosis not present

## 2019-06-02 DIAGNOSIS — I69354 Hemiplegia and hemiparesis following cerebral infarction affecting left non-dominant side: Secondary | ICD-10-CM | POA: Diagnosis not present

## 2019-06-02 DIAGNOSIS — K219 Gastro-esophageal reflux disease without esophagitis: Secondary | ICD-10-CM | POA: Diagnosis not present

## 2019-06-02 DIAGNOSIS — E119 Type 2 diabetes mellitus without complications: Secondary | ICD-10-CM | POA: Diagnosis not present

## 2019-06-02 DIAGNOSIS — R1312 Dysphagia, oropharyngeal phase: Secondary | ICD-10-CM | POA: Diagnosis not present

## 2019-06-03 DIAGNOSIS — E119 Type 2 diabetes mellitus without complications: Secondary | ICD-10-CM | POA: Diagnosis not present

## 2019-06-03 DIAGNOSIS — I69354 Hemiplegia and hemiparesis following cerebral infarction affecting left non-dominant side: Secondary | ICD-10-CM | POA: Diagnosis not present

## 2019-06-03 DIAGNOSIS — R1312 Dysphagia, oropharyngeal phase: Secondary | ICD-10-CM | POA: Diagnosis not present

## 2019-06-03 DIAGNOSIS — K219 Gastro-esophageal reflux disease without esophagitis: Secondary | ICD-10-CM | POA: Diagnosis not present

## 2019-06-04 DIAGNOSIS — R1312 Dysphagia, oropharyngeal phase: Secondary | ICD-10-CM | POA: Diagnosis not present

## 2019-06-04 DIAGNOSIS — I82621 Acute embolism and thrombosis of deep veins of right upper extremity: Secondary | ICD-10-CM | POA: Diagnosis not present

## 2019-06-04 DIAGNOSIS — I739 Peripheral vascular disease, unspecified: Secondary | ICD-10-CM | POA: Diagnosis not present

## 2019-06-04 DIAGNOSIS — K219 Gastro-esophageal reflux disease without esophagitis: Secondary | ICD-10-CM | POA: Diagnosis not present

## 2019-06-04 DIAGNOSIS — E1159 Type 2 diabetes mellitus with other circulatory complications: Secondary | ICD-10-CM | POA: Diagnosis not present

## 2019-06-04 DIAGNOSIS — I69354 Hemiplegia and hemiparesis following cerebral infarction affecting left non-dominant side: Secondary | ICD-10-CM | POA: Diagnosis not present

## 2019-06-04 DIAGNOSIS — E119 Type 2 diabetes mellitus without complications: Secondary | ICD-10-CM | POA: Diagnosis not present

## 2019-06-09 DIAGNOSIS — J189 Pneumonia, unspecified organism: Secondary | ICD-10-CM | POA: Diagnosis not present

## 2019-06-24 DIAGNOSIS — I69354 Hemiplegia and hemiparesis following cerebral infarction affecting left non-dominant side: Secondary | ICD-10-CM | POA: Diagnosis not present

## 2019-06-25 DIAGNOSIS — I739 Peripheral vascular disease, unspecified: Secondary | ICD-10-CM | POA: Diagnosis not present

## 2019-06-25 DIAGNOSIS — E1159 Type 2 diabetes mellitus with other circulatory complications: Secondary | ICD-10-CM | POA: Diagnosis not present

## 2019-06-27 DIAGNOSIS — I69354 Hemiplegia and hemiparesis following cerebral infarction affecting left non-dominant side: Secondary | ICD-10-CM | POA: Diagnosis not present

## 2019-06-27 DIAGNOSIS — R293 Abnormal posture: Secondary | ICD-10-CM | POA: Diagnosis not present

## 2019-06-27 DIAGNOSIS — M24542 Contracture, left hand: Secondary | ICD-10-CM | POA: Diagnosis not present

## 2019-07-01 DIAGNOSIS — I69354 Hemiplegia and hemiparesis following cerebral infarction affecting left non-dominant side: Secondary | ICD-10-CM | POA: Diagnosis not present

## 2019-07-01 DIAGNOSIS — R293 Abnormal posture: Secondary | ICD-10-CM | POA: Diagnosis not present

## 2019-07-01 DIAGNOSIS — M24542 Contracture, left hand: Secondary | ICD-10-CM | POA: Diagnosis not present

## 2019-07-02 DIAGNOSIS — I69354 Hemiplegia and hemiparesis following cerebral infarction affecting left non-dominant side: Secondary | ICD-10-CM | POA: Diagnosis not present

## 2019-07-02 DIAGNOSIS — M24542 Contracture, left hand: Secondary | ICD-10-CM | POA: Diagnosis not present

## 2019-07-02 DIAGNOSIS — R293 Abnormal posture: Secondary | ICD-10-CM | POA: Diagnosis not present

## 2019-07-07 DIAGNOSIS — I69354 Hemiplegia and hemiparesis following cerebral infarction affecting left non-dominant side: Secondary | ICD-10-CM | POA: Diagnosis not present

## 2019-07-07 DIAGNOSIS — R293 Abnormal posture: Secondary | ICD-10-CM | POA: Diagnosis not present

## 2019-07-07 DIAGNOSIS — M24542 Contracture, left hand: Secondary | ICD-10-CM | POA: Diagnosis not present

## 2019-07-08 DIAGNOSIS — I69354 Hemiplegia and hemiparesis following cerebral infarction affecting left non-dominant side: Secondary | ICD-10-CM | POA: Diagnosis not present

## 2019-07-08 DIAGNOSIS — R293 Abnormal posture: Secondary | ICD-10-CM | POA: Diagnosis not present

## 2019-07-08 DIAGNOSIS — M24542 Contracture, left hand: Secondary | ICD-10-CM | POA: Diagnosis not present

## 2019-07-09 DIAGNOSIS — I69354 Hemiplegia and hemiparesis following cerebral infarction affecting left non-dominant side: Secondary | ICD-10-CM | POA: Diagnosis not present

## 2019-07-09 DIAGNOSIS — R293 Abnormal posture: Secondary | ICD-10-CM | POA: Diagnosis not present

## 2019-07-09 DIAGNOSIS — M24542 Contracture, left hand: Secondary | ICD-10-CM | POA: Diagnosis not present

## 2019-07-10 DIAGNOSIS — I69354 Hemiplegia and hemiparesis following cerebral infarction affecting left non-dominant side: Secondary | ICD-10-CM | POA: Diagnosis not present

## 2019-07-10 DIAGNOSIS — R293 Abnormal posture: Secondary | ICD-10-CM | POA: Diagnosis not present

## 2019-07-10 DIAGNOSIS — M24542 Contracture, left hand: Secondary | ICD-10-CM | POA: Diagnosis not present

## 2019-07-11 DIAGNOSIS — I69354 Hemiplegia and hemiparesis following cerebral infarction affecting left non-dominant side: Secondary | ICD-10-CM | POA: Diagnosis not present

## 2019-07-11 DIAGNOSIS — M24542 Contracture, left hand: Secondary | ICD-10-CM | POA: Diagnosis not present

## 2019-07-11 DIAGNOSIS — R293 Abnormal posture: Secondary | ICD-10-CM | POA: Diagnosis not present

## 2019-07-14 DIAGNOSIS — R293 Abnormal posture: Secondary | ICD-10-CM | POA: Diagnosis not present

## 2019-07-14 DIAGNOSIS — R1312 Dysphagia, oropharyngeal phase: Secondary | ICD-10-CM | POA: Diagnosis not present

## 2019-07-14 DIAGNOSIS — I69354 Hemiplegia and hemiparesis following cerebral infarction affecting left non-dominant side: Secondary | ICD-10-CM | POA: Diagnosis not present

## 2019-07-14 DIAGNOSIS — M24542 Contracture, left hand: Secondary | ICD-10-CM | POA: Diagnosis not present

## 2019-07-14 DIAGNOSIS — R1311 Dysphagia, oral phase: Secondary | ICD-10-CM | POA: Diagnosis not present

## 2019-07-14 DIAGNOSIS — R627 Adult failure to thrive: Secondary | ICD-10-CM | POA: Diagnosis not present

## 2019-07-15 DIAGNOSIS — R1312 Dysphagia, oropharyngeal phase: Secondary | ICD-10-CM | POA: Diagnosis not present

## 2019-07-15 DIAGNOSIS — R293 Abnormal posture: Secondary | ICD-10-CM | POA: Diagnosis not present

## 2019-07-15 DIAGNOSIS — M24542 Contracture, left hand: Secondary | ICD-10-CM | POA: Diagnosis not present

## 2019-07-15 DIAGNOSIS — R1311 Dysphagia, oral phase: Secondary | ICD-10-CM | POA: Diagnosis not present

## 2019-07-15 DIAGNOSIS — R627 Adult failure to thrive: Secondary | ICD-10-CM | POA: Diagnosis not present

## 2019-07-15 DIAGNOSIS — I69354 Hemiplegia and hemiparesis following cerebral infarction affecting left non-dominant side: Secondary | ICD-10-CM | POA: Diagnosis not present

## 2019-07-16 DIAGNOSIS — R1312 Dysphagia, oropharyngeal phase: Secondary | ICD-10-CM | POA: Diagnosis not present

## 2019-07-16 DIAGNOSIS — R627 Adult failure to thrive: Secondary | ICD-10-CM | POA: Diagnosis not present

## 2019-07-16 DIAGNOSIS — R293 Abnormal posture: Secondary | ICD-10-CM | POA: Diagnosis not present

## 2019-07-16 DIAGNOSIS — I69354 Hemiplegia and hemiparesis following cerebral infarction affecting left non-dominant side: Secondary | ICD-10-CM | POA: Diagnosis not present

## 2019-07-16 DIAGNOSIS — R1311 Dysphagia, oral phase: Secondary | ICD-10-CM | POA: Diagnosis not present

## 2019-07-16 DIAGNOSIS — M24542 Contracture, left hand: Secondary | ICD-10-CM | POA: Diagnosis not present

## 2019-07-16 DIAGNOSIS — E1165 Type 2 diabetes mellitus with hyperglycemia: Secondary | ICD-10-CM | POA: Diagnosis not present

## 2019-07-17 DIAGNOSIS — I69354 Hemiplegia and hemiparesis following cerebral infarction affecting left non-dominant side: Secondary | ICD-10-CM | POA: Diagnosis not present

## 2019-07-17 DIAGNOSIS — M24542 Contracture, left hand: Secondary | ICD-10-CM | POA: Diagnosis not present

## 2019-07-17 DIAGNOSIS — R1311 Dysphagia, oral phase: Secondary | ICD-10-CM | POA: Diagnosis not present

## 2019-07-17 DIAGNOSIS — R627 Adult failure to thrive: Secondary | ICD-10-CM | POA: Diagnosis not present

## 2019-07-17 DIAGNOSIS — R1312 Dysphagia, oropharyngeal phase: Secondary | ICD-10-CM | POA: Diagnosis not present

## 2019-07-17 DIAGNOSIS — R293 Abnormal posture: Secondary | ICD-10-CM | POA: Diagnosis not present

## 2019-07-18 DIAGNOSIS — R1311 Dysphagia, oral phase: Secondary | ICD-10-CM | POA: Diagnosis not present

## 2019-07-18 DIAGNOSIS — R627 Adult failure to thrive: Secondary | ICD-10-CM | POA: Diagnosis not present

## 2019-07-18 DIAGNOSIS — R1312 Dysphagia, oropharyngeal phase: Secondary | ICD-10-CM | POA: Diagnosis not present

## 2019-07-18 DIAGNOSIS — R293 Abnormal posture: Secondary | ICD-10-CM | POA: Diagnosis not present

## 2019-07-18 DIAGNOSIS — M24542 Contracture, left hand: Secondary | ICD-10-CM | POA: Diagnosis not present

## 2019-07-18 DIAGNOSIS — I69354 Hemiplegia and hemiparesis following cerebral infarction affecting left non-dominant side: Secondary | ICD-10-CM | POA: Diagnosis not present

## 2019-07-21 DIAGNOSIS — R293 Abnormal posture: Secondary | ICD-10-CM | POA: Diagnosis not present

## 2019-07-21 DIAGNOSIS — M24542 Contracture, left hand: Secondary | ICD-10-CM | POA: Diagnosis not present

## 2019-07-21 DIAGNOSIS — R1311 Dysphagia, oral phase: Secondary | ICD-10-CM | POA: Diagnosis not present

## 2019-07-21 DIAGNOSIS — I69354 Hemiplegia and hemiparesis following cerebral infarction affecting left non-dominant side: Secondary | ICD-10-CM | POA: Diagnosis not present

## 2019-07-21 DIAGNOSIS — R627 Adult failure to thrive: Secondary | ICD-10-CM | POA: Diagnosis not present

## 2019-07-21 DIAGNOSIS — R1312 Dysphagia, oropharyngeal phase: Secondary | ICD-10-CM | POA: Diagnosis not present

## 2019-07-23 DIAGNOSIS — I69354 Hemiplegia and hemiparesis following cerebral infarction affecting left non-dominant side: Secondary | ICD-10-CM | POA: Diagnosis not present

## 2019-07-23 DIAGNOSIS — R627 Adult failure to thrive: Secondary | ICD-10-CM | POA: Diagnosis not present

## 2019-07-23 DIAGNOSIS — R1311 Dysphagia, oral phase: Secondary | ICD-10-CM | POA: Diagnosis not present

## 2019-07-23 DIAGNOSIS — R293 Abnormal posture: Secondary | ICD-10-CM | POA: Diagnosis not present

## 2019-07-23 DIAGNOSIS — R1312 Dysphagia, oropharyngeal phase: Secondary | ICD-10-CM | POA: Diagnosis not present

## 2019-07-23 DIAGNOSIS — M24542 Contracture, left hand: Secondary | ICD-10-CM | POA: Diagnosis not present

## 2019-07-24 DIAGNOSIS — R627 Adult failure to thrive: Secondary | ICD-10-CM | POA: Diagnosis not present

## 2019-07-25 DIAGNOSIS — I69354 Hemiplegia and hemiparesis following cerebral infarction affecting left non-dominant side: Secondary | ICD-10-CM | POA: Diagnosis not present

## 2019-07-25 DIAGNOSIS — M24542 Contracture, left hand: Secondary | ICD-10-CM | POA: Diagnosis not present

## 2019-07-25 DIAGNOSIS — R627 Adult failure to thrive: Secondary | ICD-10-CM | POA: Diagnosis not present

## 2019-07-25 DIAGNOSIS — R293 Abnormal posture: Secondary | ICD-10-CM | POA: Diagnosis not present

## 2019-07-25 DIAGNOSIS — R1312 Dysphagia, oropharyngeal phase: Secondary | ICD-10-CM | POA: Diagnosis not present

## 2019-07-25 DIAGNOSIS — R1311 Dysphagia, oral phase: Secondary | ICD-10-CM | POA: Diagnosis not present

## 2019-07-26 DIAGNOSIS — R627 Adult failure to thrive: Secondary | ICD-10-CM | POA: Diagnosis not present

## 2019-07-26 DIAGNOSIS — M24542 Contracture, left hand: Secondary | ICD-10-CM | POA: Diagnosis not present

## 2019-07-26 DIAGNOSIS — I69354 Hemiplegia and hemiparesis following cerebral infarction affecting left non-dominant side: Secondary | ICD-10-CM | POA: Diagnosis not present

## 2019-07-26 DIAGNOSIS — R1311 Dysphagia, oral phase: Secondary | ICD-10-CM | POA: Diagnosis not present

## 2019-07-26 DIAGNOSIS — R1312 Dysphagia, oropharyngeal phase: Secondary | ICD-10-CM | POA: Diagnosis not present

## 2019-07-26 DIAGNOSIS — R293 Abnormal posture: Secondary | ICD-10-CM | POA: Diagnosis not present

## 2019-07-28 DIAGNOSIS — R1312 Dysphagia, oropharyngeal phase: Secondary | ICD-10-CM | POA: Diagnosis not present

## 2019-07-28 DIAGNOSIS — R1311 Dysphagia, oral phase: Secondary | ICD-10-CM | POA: Diagnosis not present

## 2019-07-28 DIAGNOSIS — M24542 Contracture, left hand: Secondary | ICD-10-CM | POA: Diagnosis not present

## 2019-07-28 DIAGNOSIS — R627 Adult failure to thrive: Secondary | ICD-10-CM | POA: Diagnosis not present

## 2019-07-28 DIAGNOSIS — R293 Abnormal posture: Secondary | ICD-10-CM | POA: Diagnosis not present

## 2019-07-28 DIAGNOSIS — I69354 Hemiplegia and hemiparesis following cerebral infarction affecting left non-dominant side: Secondary | ICD-10-CM | POA: Diagnosis not present

## 2019-07-29 DIAGNOSIS — R1312 Dysphagia, oropharyngeal phase: Secondary | ICD-10-CM | POA: Diagnosis not present

## 2019-07-29 DIAGNOSIS — R1311 Dysphagia, oral phase: Secondary | ICD-10-CM | POA: Diagnosis not present

## 2019-07-29 DIAGNOSIS — M24542 Contracture, left hand: Secondary | ICD-10-CM | POA: Diagnosis not present

## 2019-07-29 DIAGNOSIS — R293 Abnormal posture: Secondary | ICD-10-CM | POA: Diagnosis not present

## 2019-07-29 DIAGNOSIS — R627 Adult failure to thrive: Secondary | ICD-10-CM | POA: Diagnosis not present

## 2019-07-29 DIAGNOSIS — I69354 Hemiplegia and hemiparesis following cerebral infarction affecting left non-dominant side: Secondary | ICD-10-CM | POA: Diagnosis not present

## 2019-07-30 DIAGNOSIS — R1311 Dysphagia, oral phase: Secondary | ICD-10-CM | POA: Diagnosis not present

## 2019-07-30 DIAGNOSIS — R293 Abnormal posture: Secondary | ICD-10-CM | POA: Diagnosis not present

## 2019-07-30 DIAGNOSIS — R1312 Dysphagia, oropharyngeal phase: Secondary | ICD-10-CM | POA: Diagnosis not present

## 2019-07-30 DIAGNOSIS — M24542 Contracture, left hand: Secondary | ICD-10-CM | POA: Diagnosis not present

## 2019-07-30 DIAGNOSIS — R627 Adult failure to thrive: Secondary | ICD-10-CM | POA: Diagnosis not present

## 2019-07-30 DIAGNOSIS — I69354 Hemiplegia and hemiparesis following cerebral infarction affecting left non-dominant side: Secondary | ICD-10-CM | POA: Diagnosis not present

## 2019-07-31 DIAGNOSIS — R1312 Dysphagia, oropharyngeal phase: Secondary | ICD-10-CM | POA: Diagnosis not present

## 2019-07-31 DIAGNOSIS — R1311 Dysphagia, oral phase: Secondary | ICD-10-CM | POA: Diagnosis not present

## 2019-07-31 DIAGNOSIS — R627 Adult failure to thrive: Secondary | ICD-10-CM | POA: Diagnosis not present

## 2019-07-31 DIAGNOSIS — M24542 Contracture, left hand: Secondary | ICD-10-CM | POA: Diagnosis not present

## 2019-07-31 DIAGNOSIS — I69354 Hemiplegia and hemiparesis following cerebral infarction affecting left non-dominant side: Secondary | ICD-10-CM | POA: Diagnosis not present

## 2019-07-31 DIAGNOSIS — R293 Abnormal posture: Secondary | ICD-10-CM | POA: Diagnosis not present

## 2019-08-01 DIAGNOSIS — I69354 Hemiplegia and hemiparesis following cerebral infarction affecting left non-dominant side: Secondary | ICD-10-CM | POA: Diagnosis not present

## 2019-08-01 DIAGNOSIS — R1311 Dysphagia, oral phase: Secondary | ICD-10-CM | POA: Diagnosis not present

## 2019-08-01 DIAGNOSIS — R1312 Dysphagia, oropharyngeal phase: Secondary | ICD-10-CM | POA: Diagnosis not present

## 2019-08-01 DIAGNOSIS — M24542 Contracture, left hand: Secondary | ICD-10-CM | POA: Diagnosis not present

## 2019-08-01 DIAGNOSIS — R293 Abnormal posture: Secondary | ICD-10-CM | POA: Diagnosis not present

## 2019-08-01 DIAGNOSIS — R627 Adult failure to thrive: Secondary | ICD-10-CM | POA: Diagnosis not present

## 2019-08-04 DIAGNOSIS — R627 Adult failure to thrive: Secondary | ICD-10-CM | POA: Diagnosis not present

## 2019-08-04 DIAGNOSIS — R293 Abnormal posture: Secondary | ICD-10-CM | POA: Diagnosis not present

## 2019-08-04 DIAGNOSIS — M24542 Contracture, left hand: Secondary | ICD-10-CM | POA: Diagnosis not present

## 2019-08-04 DIAGNOSIS — R1311 Dysphagia, oral phase: Secondary | ICD-10-CM | POA: Diagnosis not present

## 2019-08-04 DIAGNOSIS — R1312 Dysphagia, oropharyngeal phase: Secondary | ICD-10-CM | POA: Diagnosis not present

## 2019-08-04 DIAGNOSIS — I69354 Hemiplegia and hemiparesis following cerebral infarction affecting left non-dominant side: Secondary | ICD-10-CM | POA: Diagnosis not present

## 2019-08-05 DIAGNOSIS — R1311 Dysphagia, oral phase: Secondary | ICD-10-CM | POA: Diagnosis not present

## 2019-08-05 DIAGNOSIS — I69354 Hemiplegia and hemiparesis following cerebral infarction affecting left non-dominant side: Secondary | ICD-10-CM | POA: Diagnosis not present

## 2019-08-05 DIAGNOSIS — M24542 Contracture, left hand: Secondary | ICD-10-CM | POA: Diagnosis not present

## 2019-08-05 DIAGNOSIS — R627 Adult failure to thrive: Secondary | ICD-10-CM | POA: Diagnosis not present

## 2019-08-05 DIAGNOSIS — R293 Abnormal posture: Secondary | ICD-10-CM | POA: Diagnosis not present

## 2019-08-05 DIAGNOSIS — R1312 Dysphagia, oropharyngeal phase: Secondary | ICD-10-CM | POA: Diagnosis not present

## 2019-08-06 DIAGNOSIS — R1311 Dysphagia, oral phase: Secondary | ICD-10-CM | POA: Diagnosis not present

## 2019-08-06 DIAGNOSIS — I69354 Hemiplegia and hemiparesis following cerebral infarction affecting left non-dominant side: Secondary | ICD-10-CM | POA: Diagnosis not present

## 2019-08-06 DIAGNOSIS — R293 Abnormal posture: Secondary | ICD-10-CM | POA: Diagnosis not present

## 2019-08-06 DIAGNOSIS — R1312 Dysphagia, oropharyngeal phase: Secondary | ICD-10-CM | POA: Diagnosis not present

## 2019-08-06 DIAGNOSIS — M24542 Contracture, left hand: Secondary | ICD-10-CM | POA: Diagnosis not present

## 2019-08-06 DIAGNOSIS — R627 Adult failure to thrive: Secondary | ICD-10-CM | POA: Diagnosis not present

## 2019-08-07 DIAGNOSIS — R1312 Dysphagia, oropharyngeal phase: Secondary | ICD-10-CM | POA: Diagnosis not present

## 2019-08-07 DIAGNOSIS — R293 Abnormal posture: Secondary | ICD-10-CM | POA: Diagnosis not present

## 2019-08-07 DIAGNOSIS — M24542 Contracture, left hand: Secondary | ICD-10-CM | POA: Diagnosis not present

## 2019-08-07 DIAGNOSIS — I69354 Hemiplegia and hemiparesis following cerebral infarction affecting left non-dominant side: Secondary | ICD-10-CM | POA: Diagnosis not present

## 2019-08-07 DIAGNOSIS — R1311 Dysphagia, oral phase: Secondary | ICD-10-CM | POA: Diagnosis not present

## 2019-08-07 DIAGNOSIS — R627 Adult failure to thrive: Secondary | ICD-10-CM | POA: Diagnosis not present

## 2019-08-08 DIAGNOSIS — M24542 Contracture, left hand: Secondary | ICD-10-CM | POA: Diagnosis not present

## 2019-08-08 DIAGNOSIS — R1311 Dysphagia, oral phase: Secondary | ICD-10-CM | POA: Diagnosis not present

## 2019-08-08 DIAGNOSIS — I69354 Hemiplegia and hemiparesis following cerebral infarction affecting left non-dominant side: Secondary | ICD-10-CM | POA: Diagnosis not present

## 2019-08-08 DIAGNOSIS — R627 Adult failure to thrive: Secondary | ICD-10-CM | POA: Diagnosis not present

## 2019-08-08 DIAGNOSIS — R1312 Dysphagia, oropharyngeal phase: Secondary | ICD-10-CM | POA: Diagnosis not present

## 2019-08-08 DIAGNOSIS — R293 Abnormal posture: Secondary | ICD-10-CM | POA: Diagnosis not present

## 2019-08-09 DIAGNOSIS — I69354 Hemiplegia and hemiparesis following cerebral infarction affecting left non-dominant side: Secondary | ICD-10-CM | POA: Diagnosis not present

## 2019-08-09 DIAGNOSIS — R1312 Dysphagia, oropharyngeal phase: Secondary | ICD-10-CM | POA: Diagnosis not present

## 2019-08-09 DIAGNOSIS — M24542 Contracture, left hand: Secondary | ICD-10-CM | POA: Diagnosis not present

## 2019-08-09 DIAGNOSIS — R1311 Dysphagia, oral phase: Secondary | ICD-10-CM | POA: Diagnosis not present

## 2019-08-09 DIAGNOSIS — R627 Adult failure to thrive: Secondary | ICD-10-CM | POA: Diagnosis not present

## 2019-08-09 DIAGNOSIS — R293 Abnormal posture: Secondary | ICD-10-CM | POA: Diagnosis not present

## 2019-08-10 DIAGNOSIS — I69354 Hemiplegia and hemiparesis following cerebral infarction affecting left non-dominant side: Secondary | ICD-10-CM | POA: Diagnosis not present

## 2019-08-10 DIAGNOSIS — R1312 Dysphagia, oropharyngeal phase: Secondary | ICD-10-CM | POA: Diagnosis not present

## 2019-08-10 DIAGNOSIS — M24542 Contracture, left hand: Secondary | ICD-10-CM | POA: Diagnosis not present

## 2019-08-10 DIAGNOSIS — R293 Abnormal posture: Secondary | ICD-10-CM | POA: Diagnosis not present

## 2019-08-10 DIAGNOSIS — R1311 Dysphagia, oral phase: Secondary | ICD-10-CM | POA: Diagnosis not present

## 2019-08-10 DIAGNOSIS — R627 Adult failure to thrive: Secondary | ICD-10-CM | POA: Diagnosis not present

## 2019-08-11 DIAGNOSIS — R293 Abnormal posture: Secondary | ICD-10-CM | POA: Diagnosis not present

## 2019-08-11 DIAGNOSIS — M24542 Contracture, left hand: Secondary | ICD-10-CM | POA: Diagnosis not present

## 2019-08-11 DIAGNOSIS — R1311 Dysphagia, oral phase: Secondary | ICD-10-CM | POA: Diagnosis not present

## 2019-08-11 DIAGNOSIS — I69354 Hemiplegia and hemiparesis following cerebral infarction affecting left non-dominant side: Secondary | ICD-10-CM | POA: Diagnosis not present

## 2019-08-11 DIAGNOSIS — R1312 Dysphagia, oropharyngeal phase: Secondary | ICD-10-CM | POA: Diagnosis not present

## 2019-08-11 DIAGNOSIS — L89322 Pressure ulcer of left buttock, stage 2: Secondary | ICD-10-CM | POA: Diagnosis not present

## 2019-08-11 DIAGNOSIS — R627 Adult failure to thrive: Secondary | ICD-10-CM | POA: Diagnosis not present

## 2019-08-12 DIAGNOSIS — I69354 Hemiplegia and hemiparesis following cerebral infarction affecting left non-dominant side: Secondary | ICD-10-CM | POA: Diagnosis not present

## 2019-08-12 DIAGNOSIS — R293 Abnormal posture: Secondary | ICD-10-CM | POA: Diagnosis not present

## 2019-08-12 DIAGNOSIS — R1311 Dysphagia, oral phase: Secondary | ICD-10-CM | POA: Diagnosis not present

## 2019-08-12 DIAGNOSIS — R627 Adult failure to thrive: Secondary | ICD-10-CM | POA: Diagnosis not present

## 2019-08-12 DIAGNOSIS — M24542 Contracture, left hand: Secondary | ICD-10-CM | POA: Diagnosis not present

## 2019-08-12 DIAGNOSIS — R1312 Dysphagia, oropharyngeal phase: Secondary | ICD-10-CM | POA: Diagnosis not present

## 2019-08-13 DIAGNOSIS — I69354 Hemiplegia and hemiparesis following cerebral infarction affecting left non-dominant side: Secondary | ICD-10-CM | POA: Diagnosis not present

## 2019-08-13 DIAGNOSIS — B351 Tinea unguium: Secondary | ICD-10-CM | POA: Diagnosis not present

## 2019-08-13 DIAGNOSIS — R1312 Dysphagia, oropharyngeal phase: Secondary | ICD-10-CM | POA: Diagnosis not present

## 2019-08-13 DIAGNOSIS — R627 Adult failure to thrive: Secondary | ICD-10-CM | POA: Diagnosis not present

## 2019-08-13 DIAGNOSIS — R1311 Dysphagia, oral phase: Secondary | ICD-10-CM | POA: Diagnosis not present

## 2019-08-13 DIAGNOSIS — M24542 Contracture, left hand: Secondary | ICD-10-CM | POA: Diagnosis not present

## 2019-08-13 DIAGNOSIS — I739 Peripheral vascular disease, unspecified: Secondary | ICD-10-CM | POA: Diagnosis not present

## 2019-08-13 DIAGNOSIS — L603 Nail dystrophy: Secondary | ICD-10-CM | POA: Diagnosis not present

## 2019-08-13 DIAGNOSIS — Q845 Enlarged and hypertrophic nails: Secondary | ICD-10-CM | POA: Diagnosis not present

## 2019-08-13 DIAGNOSIS — E1159 Type 2 diabetes mellitus with other circulatory complications: Secondary | ICD-10-CM | POA: Diagnosis not present

## 2019-08-13 DIAGNOSIS — R293 Abnormal posture: Secondary | ICD-10-CM | POA: Diagnosis not present

## 2019-08-14 DIAGNOSIS — R1311 Dysphagia, oral phase: Secondary | ICD-10-CM | POA: Diagnosis not present

## 2019-08-14 DIAGNOSIS — R627 Adult failure to thrive: Secondary | ICD-10-CM | POA: Diagnosis not present

## 2019-08-14 DIAGNOSIS — R1312 Dysphagia, oropharyngeal phase: Secondary | ICD-10-CM | POA: Diagnosis not present

## 2019-08-14 DIAGNOSIS — M24542 Contracture, left hand: Secondary | ICD-10-CM | POA: Diagnosis not present

## 2019-08-14 DIAGNOSIS — R293 Abnormal posture: Secondary | ICD-10-CM | POA: Diagnosis not present

## 2019-08-14 DIAGNOSIS — I69354 Hemiplegia and hemiparesis following cerebral infarction affecting left non-dominant side: Secondary | ICD-10-CM | POA: Diagnosis not present

## 2019-08-15 DIAGNOSIS — R627 Adult failure to thrive: Secondary | ICD-10-CM | POA: Diagnosis not present

## 2019-08-15 DIAGNOSIS — R1311 Dysphagia, oral phase: Secondary | ICD-10-CM | POA: Diagnosis not present

## 2019-08-15 DIAGNOSIS — I69354 Hemiplegia and hemiparesis following cerebral infarction affecting left non-dominant side: Secondary | ICD-10-CM | POA: Diagnosis not present

## 2019-08-15 DIAGNOSIS — R293 Abnormal posture: Secondary | ICD-10-CM | POA: Diagnosis not present

## 2019-08-15 DIAGNOSIS — M24542 Contracture, left hand: Secondary | ICD-10-CM | POA: Diagnosis not present

## 2019-08-15 DIAGNOSIS — R1312 Dysphagia, oropharyngeal phase: Secondary | ICD-10-CM | POA: Diagnosis not present

## 2019-08-16 DIAGNOSIS — I69354 Hemiplegia and hemiparesis following cerebral infarction affecting left non-dominant side: Secondary | ICD-10-CM | POA: Diagnosis not present

## 2019-08-16 DIAGNOSIS — R293 Abnormal posture: Secondary | ICD-10-CM | POA: Diagnosis not present

## 2019-08-16 DIAGNOSIS — R1311 Dysphagia, oral phase: Secondary | ICD-10-CM | POA: Diagnosis not present

## 2019-08-16 DIAGNOSIS — M24542 Contracture, left hand: Secondary | ICD-10-CM | POA: Diagnosis not present

## 2019-08-16 DIAGNOSIS — R627 Adult failure to thrive: Secondary | ICD-10-CM | POA: Diagnosis not present

## 2019-08-16 DIAGNOSIS — R1312 Dysphagia, oropharyngeal phase: Secondary | ICD-10-CM | POA: Diagnosis not present

## 2019-08-17 DIAGNOSIS — R293 Abnormal posture: Secondary | ICD-10-CM | POA: Diagnosis not present

## 2019-08-17 DIAGNOSIS — R627 Adult failure to thrive: Secondary | ICD-10-CM | POA: Diagnosis not present

## 2019-08-17 DIAGNOSIS — R1312 Dysphagia, oropharyngeal phase: Secondary | ICD-10-CM | POA: Diagnosis not present

## 2019-08-17 DIAGNOSIS — R1311 Dysphagia, oral phase: Secondary | ICD-10-CM | POA: Diagnosis not present

## 2019-08-17 DIAGNOSIS — I69354 Hemiplegia and hemiparesis following cerebral infarction affecting left non-dominant side: Secondary | ICD-10-CM | POA: Diagnosis not present

## 2019-08-17 DIAGNOSIS — M24542 Contracture, left hand: Secondary | ICD-10-CM | POA: Diagnosis not present

## 2019-08-18 DIAGNOSIS — R1311 Dysphagia, oral phase: Secondary | ICD-10-CM | POA: Diagnosis not present

## 2019-08-18 DIAGNOSIS — R293 Abnormal posture: Secondary | ICD-10-CM | POA: Diagnosis not present

## 2019-08-18 DIAGNOSIS — L89322 Pressure ulcer of left buttock, stage 2: Secondary | ICD-10-CM | POA: Diagnosis not present

## 2019-08-18 DIAGNOSIS — M24542 Contracture, left hand: Secondary | ICD-10-CM | POA: Diagnosis not present

## 2019-08-18 DIAGNOSIS — R1312 Dysphagia, oropharyngeal phase: Secondary | ICD-10-CM | POA: Diagnosis not present

## 2019-08-18 DIAGNOSIS — R627 Adult failure to thrive: Secondary | ICD-10-CM | POA: Diagnosis not present

## 2019-08-18 DIAGNOSIS — I69354 Hemiplegia and hemiparesis following cerebral infarction affecting left non-dominant side: Secondary | ICD-10-CM | POA: Diagnosis not present

## 2019-08-18 DIAGNOSIS — R490 Dysphonia: Secondary | ICD-10-CM | POA: Diagnosis not present

## 2019-08-19 DIAGNOSIS — I69354 Hemiplegia and hemiparesis following cerebral infarction affecting left non-dominant side: Secondary | ICD-10-CM | POA: Diagnosis not present

## 2019-08-19 DIAGNOSIS — R1311 Dysphagia, oral phase: Secondary | ICD-10-CM | POA: Diagnosis not present

## 2019-08-19 DIAGNOSIS — R627 Adult failure to thrive: Secondary | ICD-10-CM | POA: Diagnosis not present

## 2019-08-19 DIAGNOSIS — M24542 Contracture, left hand: Secondary | ICD-10-CM | POA: Diagnosis not present

## 2019-08-19 DIAGNOSIS — R1312 Dysphagia, oropharyngeal phase: Secondary | ICD-10-CM | POA: Diagnosis not present

## 2019-08-19 DIAGNOSIS — R293 Abnormal posture: Secondary | ICD-10-CM | POA: Diagnosis not present

## 2019-08-21 DIAGNOSIS — J189 Pneumonia, unspecified organism: Secondary | ICD-10-CM | POA: Diagnosis not present

## 2019-08-21 DIAGNOSIS — Z66 Do not resuscitate: Secondary | ICD-10-CM | POA: Diagnosis not present

## 2019-08-21 DIAGNOSIS — R490 Dysphonia: Secondary | ICD-10-CM | POA: Diagnosis not present

## 2019-08-21 DIAGNOSIS — Z4682 Encounter for fitting and adjustment of non-vascular catheter: Secondary | ICD-10-CM | POA: Diagnosis not present

## 2019-08-21 DIAGNOSIS — R000 Tachycardia, unspecified: Secondary | ICD-10-CM | POA: Diagnosis not present

## 2019-08-21 DIAGNOSIS — E785 Hyperlipidemia, unspecified: Secondary | ICD-10-CM | POA: Diagnosis not present

## 2019-08-21 DIAGNOSIS — E870 Hyperosmolality and hypernatremia: Secondary | ICD-10-CM | POA: Diagnosis not present

## 2019-08-21 DIAGNOSIS — Z743 Need for continuous supervision: Secondary | ICD-10-CM | POA: Diagnosis not present

## 2019-08-21 DIAGNOSIS — Z515 Encounter for palliative care: Secondary | ICD-10-CM | POA: Diagnosis not present

## 2019-08-21 DIAGNOSIS — R7989 Other specified abnormal findings of blood chemistry: Secondary | ICD-10-CM | POA: Diagnosis not present

## 2019-08-21 DIAGNOSIS — M1A9XX0 Chronic gout, unspecified, without tophus (tophi): Secondary | ICD-10-CM | POA: Diagnosis not present

## 2019-08-21 DIAGNOSIS — J690 Pneumonitis due to inhalation of food and vomit: Secondary | ICD-10-CM | POA: Diagnosis not present

## 2019-08-21 DIAGNOSIS — J449 Chronic obstructive pulmonary disease, unspecified: Secondary | ICD-10-CM | POA: Diagnosis not present

## 2019-08-21 DIAGNOSIS — Z4659 Encounter for fitting and adjustment of other gastrointestinal appliance and device: Secondary | ICD-10-CM | POA: Diagnosis not present

## 2019-08-21 DIAGNOSIS — R748 Abnormal levels of other serum enzymes: Secondary | ICD-10-CM | POA: Diagnosis not present

## 2019-08-21 DIAGNOSIS — K219 Gastro-esophageal reflux disease without esophagitis: Secondary | ICD-10-CM | POA: Diagnosis not present

## 2019-08-21 DIAGNOSIS — R531 Weakness: Secondary | ICD-10-CM | POA: Diagnosis not present

## 2019-08-21 DIAGNOSIS — L89151 Pressure ulcer of sacral region, stage 1: Secondary | ICD-10-CM | POA: Diagnosis not present

## 2019-08-21 DIAGNOSIS — R0602 Shortness of breath: Secondary | ICD-10-CM | POA: Diagnosis not present

## 2019-08-21 DIAGNOSIS — I110 Hypertensive heart disease with heart failure: Secondary | ICD-10-CM | POA: Diagnosis not present

## 2019-08-21 DIAGNOSIS — J9601 Acute respiratory failure with hypoxia: Secondary | ICD-10-CM | POA: Diagnosis not present

## 2019-08-21 DIAGNOSIS — E43 Unspecified severe protein-calorie malnutrition: Secondary | ICD-10-CM | POA: Diagnosis not present

## 2019-08-21 DIAGNOSIS — R0902 Hypoxemia: Secondary | ICD-10-CM | POA: Diagnosis not present

## 2019-08-21 DIAGNOSIS — R1310 Dysphagia, unspecified: Secondary | ICD-10-CM | POA: Diagnosis not present

## 2019-08-21 DIAGNOSIS — E039 Hypothyroidism, unspecified: Secondary | ICD-10-CM | POA: Diagnosis not present

## 2019-08-21 DIAGNOSIS — R6520 Severe sepsis without septic shock: Secondary | ICD-10-CM | POA: Diagnosis not present

## 2019-08-21 DIAGNOSIS — N390 Urinary tract infection, site not specified: Secondary | ICD-10-CM | POA: Diagnosis not present

## 2019-08-21 DIAGNOSIS — I5032 Chronic diastolic (congestive) heart failure: Secondary | ICD-10-CM | POA: Diagnosis not present

## 2019-08-21 DIAGNOSIS — R778 Other specified abnormalities of plasma proteins: Secondary | ICD-10-CM | POA: Diagnosis not present

## 2019-08-21 DIAGNOSIS — Z20822 Contact with and (suspected) exposure to covid-19: Secondary | ICD-10-CM | POA: Diagnosis not present

## 2019-08-21 DIAGNOSIS — E871 Hypo-osmolality and hyponatremia: Secondary | ICD-10-CM | POA: Diagnosis not present

## 2019-08-21 DIAGNOSIS — A419 Sepsis, unspecified organism: Secondary | ICD-10-CM | POA: Diagnosis not present

## 2019-09-11 DEATH — deceased
